# Patient Record
Sex: Female | Born: 1955
Health system: Southern US, Community
[De-identification: ages and names within clinical notes are randomized; demographics above are authoritative.]

## PROBLEM LIST (undated history)

## (undated) DIAGNOSIS — E079 Disorder of thyroid, unspecified: Secondary | ICD-10-CM

## (undated) DIAGNOSIS — K219 Gastro-esophageal reflux disease without esophagitis: Secondary | ICD-10-CM

## (undated) DIAGNOSIS — I8390 Asymptomatic varicose veins of unspecified lower extremity: Secondary | ICD-10-CM

## (undated) DIAGNOSIS — C50919 Malignant neoplasm of unspecified site of unspecified female breast: Secondary | ICD-10-CM

## (undated) DIAGNOSIS — R609 Edema, unspecified: Secondary | ICD-10-CM

## (undated) DIAGNOSIS — E039 Hypothyroidism, unspecified: Secondary | ICD-10-CM

## (undated) DIAGNOSIS — F419 Anxiety disorder, unspecified: Secondary | ICD-10-CM

## (undated) DIAGNOSIS — C801 Malignant (primary) neoplasm, unspecified: Secondary | ICD-10-CM

## (undated) DIAGNOSIS — Z9221 Personal history of antineoplastic chemotherapy: Secondary | ICD-10-CM

## (undated) DIAGNOSIS — F32A Depression, unspecified: Secondary | ICD-10-CM

## (undated) DIAGNOSIS — Z923 Personal history of irradiation: Secondary | ICD-10-CM

## (undated) DIAGNOSIS — F329 Major depressive disorder, single episode, unspecified: Secondary | ICD-10-CM

## (undated) DIAGNOSIS — R5381 Other malaise: Secondary | ICD-10-CM

## (undated) DIAGNOSIS — Z8719 Personal history of other diseases of the digestive system: Secondary | ICD-10-CM

## (undated) HISTORY — DX: Other malaise: R53.81

## (undated) HISTORY — PX: MASTECTOMY: SHX3

## (undated) HISTORY — DX: Disorder of thyroid, unspecified: E07.9

## (undated) HISTORY — PX: ABDOMINAL HYSTERECTOMY: SHX81

## (undated) HISTORY — PX: CHOLECYSTECTOMY: SHX55

## (undated) HISTORY — PX: COLONOSCOPY: SHX174

## (undated) HISTORY — PX: BREAST SURGERY: SHX581

## (undated) HISTORY — PX: ESOPHAGEAL DILATION: SHX303

## (undated) HISTORY — DX: Edema, unspecified: R60.9

## (undated) HISTORY — DX: Asymptomatic varicose veins of unspecified lower extremity: I83.90

---

## 1998-03-04 ENCOUNTER — Other Ambulatory Visit: Admission: RE | Admit: 1998-03-04 | Discharge: 1998-03-04 | Payer: Self-pay | Admitting: Obstetrics and Gynecology

## 2000-07-04 ENCOUNTER — Encounter: Payer: Self-pay | Admitting: Endocrinology

## 2000-07-04 ENCOUNTER — Ambulatory Visit (HOSPITAL_COMMUNITY): Admission: RE | Admit: 2000-07-04 | Discharge: 2000-07-04 | Payer: Self-pay | Admitting: Endocrinology

## 2000-11-28 ENCOUNTER — Encounter: Payer: Self-pay | Admitting: Endocrinology

## 2000-11-28 ENCOUNTER — Ambulatory Visit (HOSPITAL_COMMUNITY): Admission: RE | Admit: 2000-11-28 | Discharge: 2000-11-28 | Payer: Self-pay | Admitting: Endocrinology

## 2001-05-16 ENCOUNTER — Other Ambulatory Visit: Admission: RE | Admit: 2001-05-16 | Discharge: 2001-05-16 | Payer: Self-pay | Admitting: Obstetrics and Gynecology

## 2004-02-17 ENCOUNTER — Other Ambulatory Visit: Admission: RE | Admit: 2004-02-17 | Discharge: 2004-02-17 | Payer: Self-pay | Admitting: Obstetrics and Gynecology

## 2006-02-28 ENCOUNTER — Ambulatory Visit (HOSPITAL_COMMUNITY): Admission: RE | Admit: 2006-02-28 | Discharge: 2006-02-28 | Payer: Self-pay | Admitting: Endocrinology

## 2006-11-30 ENCOUNTER — Encounter: Admission: RE | Admit: 2006-11-30 | Discharge: 2006-11-30 | Payer: Self-pay | Admitting: Gastroenterology

## 2012-11-20 ENCOUNTER — Other Ambulatory Visit (INDEPENDENT_AMBULATORY_CARE_PROVIDER_SITE_OTHER): Payer: BC Managed Care – PPO

## 2012-11-20 DIAGNOSIS — E039 Hypothyroidism, unspecified: Secondary | ICD-10-CM

## 2012-11-20 NOTE — Progress Notes (Unsigned)
Patient here today for labs only. °

## 2012-11-21 LAB — THYROID PANEL WITH TSH
Free Thyroxine Index: 3.7 (ref 1.0–3.9)
T3 Uptake: 33 % (ref 22.5–37.0)
T4, Total: 11.3 ug/dL (ref 5.0–12.5)
TSH: 5.247 u[IU]/mL — ABNORMAL HIGH (ref 0.350–4.500)

## 2012-11-25 ENCOUNTER — Telehealth: Payer: Self-pay | Admitting: Nurse Practitioner

## 2012-11-25 MED ORDER — LEVOTHYROXINE SODIUM 112 MCG PO TABS: 112.0000 ug | ORAL_TABLET | Freq: Every day | ORAL | Status: DC

## 2012-11-25 NOTE — Telephone Encounter (Signed)
Patient was taking levothyroxine 137 mcg last November.  The dose was later changed to 100 and on 11-02-12 it was increased to 112.  Information obtained from [facility]. Please send new rx to them and call Patient at  # [phone].

## 2012-11-25 NOTE — Telephone Encounter (Signed)
Let pt know rx sent in. 

## 2012-11-25 NOTE — Telephone Encounter (Signed)
Message copied by Almeta Monas on Mon Nov 25, 2012 10:05 AM ------      Message from: Bennie Pierini      Created: Thu Nov 21, 2012  9:29 AM       TSH elevated. Is patient on any thyroid meds. Not in computer? ------

## 2013-04-03 ENCOUNTER — Other Ambulatory Visit: Payer: Self-pay | Admitting: Nurse Practitioner

## 2013-04-18 ENCOUNTER — Telehealth: Payer: Self-pay | Admitting: Nurse Practitioner

## 2013-04-18 MED ORDER — FLUCONAZOLE 150 MG PO TABS: 150.0000 mg | ORAL_TABLET | Freq: Once | ORAL | Status: DC

## 2013-04-18 NOTE — Telephone Encounter (Signed)
Diflucan rx sent to pharmacy.

## 2013-04-19 ENCOUNTER — Ambulatory Visit (INDEPENDENT_AMBULATORY_CARE_PROVIDER_SITE_OTHER): Payer: BC Managed Care – PPO | Admitting: Physician Assistant

## 2013-04-19 VITALS — BP 134/78 | HR 78 | Temp 97.4°F | Ht 66.0 in | Wt 194.0 lb

## 2013-04-19 DIAGNOSIS — N760 Acute vaginitis: Secondary | ICD-10-CM

## 2013-04-19 DIAGNOSIS — A499 Bacterial infection, unspecified: Secondary | ICD-10-CM

## 2013-04-19 DIAGNOSIS — B9689 Other specified bacterial agents as the cause of diseases classified elsewhere: Secondary | ICD-10-CM

## 2013-04-19 MED ORDER — FLUCONAZOLE 150 MG PO TABS: 150.0000 mg | ORAL_TABLET | Freq: Once | ORAL | Status: DC

## 2013-04-19 MED ORDER — METRONIDAZOLE 500 MG PO TABS: 500.0000 mg | ORAL_TABLET | Freq: Two times a day (BID) | ORAL | Status: DC

## 2013-04-19 NOTE — Patient Instructions (Addendum)
Cynthia Chavez, Adult A Cynthia Chavez (also called yeast, fungus and [name]) is an overgrowth of yeast that can occur anywhere on the body. A yeast Chavez commonly occurs in warm, moist body areas. Usually, the Chavez remains localized but can spread to become a systemic Chavez. A yeast Chavez may be a sign of a more severe disease such as diabetes, leukemia, or AIDS. A yeast Chavez can occur in both men and women. In women, Cynthia vaginitis is a vaginal Chavez. It is one of the most common causes of vaginitis. Men usually do not have symptoms or know they have an Chavez until other problems develop. Men may find out they have a yeast Chavez because their sex partner has a yeast Chavez. Uncircumcised men are more likely to get a yeast Chavez than circumcised men. This is because the uncircumcised glans is not exposed to air and does not remain as dry as that of a circumcised glans. Older adults may develop yeast infections around dentures. CAUSES  Women  Antibiotics.  Steroid medication taken for a long time.  Being overweight (obese).  Diabetes.  Poor immune condition.  Certain serious medical conditions.  Immune suppressive medications for organ transplant patients.  Chemotherapy.  Pregnancy.  Menstration.  Stress and fatigue.  Intravenous drug use.  Oral contraceptives.  Wearing tight-fitting clothes in the crotch area.  Catching it from a sex partner who has a yeast Chavez.  Spermicide.  Intravenous, urinary, or other catheters. Men  Catching it from a sex partner who has a yeast Chavez.  Having oral or anal sex with a person who has the Chavez.  Spermicide.  Diabetes.  Antibiotics.  Poor immune system.  Medications that suppress the immune system.  Intravenous drug use.  Intravenous, urinary, or other catheters. SYMPTOMS  Women  Thick, white vaginal discharge.  Vaginal itching.  Redness and  swelling in and around the vagina.  Irritation of the lips of the vagina and perineum.  Blisters on the vaginal lips and perineum.  Painful sexual intercourse.  Low blood sugar (hypoglycemia).  Painful urination.  Bladder infections.  Intestinal problems such as constipation, indigestion, bad breath, bloating, increase in gas, diarrhea, or loose stools. Men  Men may develop intestinal problems such as constipation, indigestion, bad breath, bloating, increase in gas, diarrhea, or loose stools.  Dry, cracked skin on the penis with itching or discomfort.  Jock itch.  Dry, flaky skin.  Athlete's foot.  Hypoglycemia. DIAGNOSIS  Women  A history and an exam are performed.  The discharge may be examined under a microscope.  A culture may be taken of the discharge. Men  A history and an exam are performed.  Any discharge from the penis or areas of cracked skin will be looked at under the microscope and cultured.  Stool samples may be cultured. TREATMENT  Women  Vaginal antifungal suppositories and creams.  Medicated creams to decrease irritation and itching on the outside of the vagina.  Warm compresses to the perineal area to decrease swelling and discomfort.  Oral antifungal medications.  Medicated vaginal suppositories or cream for repeated or recurrent infections.  Wash and dry the irritation areas before applying the cream.  Eating yogurt with lactobacillus may help with prevention and treatment.  Sometimes painting the vagina with gentian violet solution may help if creams and suppositories do not work. Men  Antifungal creams and oral antifungal medications.  Sometimes treatment must continue for 30 days after the symptoms go away to prevent recurrence. HOME CARE   INSTRUCTIONS  Women  Use cotton underwear and avoid tight-fitting clothing.  Avoid colored, scented toilet paper and deodorant tampons or pads.  Do not douche.  Keep your diabetes  under control.  Finish all the prescribed medications.  Keep your skin clean and dry.  Consume milk or yogurt with lactobacillus active culture regularly. If you get frequent yeast infections and think that is what the Chavez is, there are over-the-counter medications that you can get. If the Chavez does not show healing in 3 days, talk to your caregiver.  Tell your sex partner you have a yeast Chavez. Your partner may need treatment also, especially if your Chavez does not clear up or recurs. Men  Keep your skin clean and dry.  Keep your diabetes under control.  Finish all prescribed medications.  Tell your sex partner that you have a yeast Chavez so they can be treated if necessary. SEEK MEDICAL CARE IF:   Your symptoms do not clear up or worsen in one week after treatment.  You have an oral temperature above 102 F (38.9 C).  You have trouble swallowing or eating for a prolonged time.  You develop blisters on and around your vagina.  You develop vaginal bleeding and it is not your menstrual period.  You develop abdominal pain.  You develop intestinal problems as mentioned above.  You get weak or lightheaded.  You have painful or increased urination.  You have pain during sexual intercourse. MAKE SURE YOU:   Understand these instructions.  Will watch your condition.  Will get help right away if you are not doing well or get worse. Document Released: 08/31/2004 Document Revised: 10/16/2011 Document Reviewed: 12/13/2009 Exit[employer] Patient Information 2014 [employer].  

## 2013-04-19 NOTE — Progress Notes (Signed)
  Subjective:    Patient ID: Cynthia Chavez, female    DOB: 01-Jun-1956, 57 y.o.   MRN: [id number]  HPI 57 y/o female presents for worsening vaginal itching and rash x 3 days. Has had similar symptoms in the past that were relieved by PO Diflucan. Has tried OTC Monistat 3 days with mild relief.     Review of Systems  Skin: Positive for color change (redness) and rash.       Objective:   Physical Exam  Constitutional: She appears well-developed and well-nourished. No distress.  Skin: Rash (genital areas) noted. She is not diaphoretic. There is erythema.          Assessment & Plan:  Vaginal Candidiasis: Rx Diflucan 150mg  today, repeat in 72 hours.  Bacterial Vaginosis: Due to nature of patient symptoms & frequency of concomitant bacterial infection with candiasis, Prescribed Flagyl 500mg  BID x 7 days. Was unable to perform wet prep to examine due to after hours and no lab available for definite diagnosis RTC if symptoms worsen or do not improve.

## 2013-04-21 NOTE — Telephone Encounter (Signed)
Patient notified that rx sent to pharmacy 

## 2013-04-24 ENCOUNTER — Encounter: Payer: Self-pay | Admitting: Nurse Practitioner

## 2013-04-24 ENCOUNTER — Ambulatory Visit (INDEPENDENT_AMBULATORY_CARE_PROVIDER_SITE_OTHER): Payer: BC Managed Care – PPO | Admitting: Nurse Practitioner

## 2013-04-24 VITALS — BP 118/77 | HR 89 | Temp 99.1°F | Ht 67.0 in | Wt 195.0 lb

## 2013-04-24 DIAGNOSIS — B3749 Other urogenital candidiasis: Secondary | ICD-10-CM

## 2013-04-24 DIAGNOSIS — H9202 Otalgia, left ear: Secondary | ICD-10-CM

## 2013-04-24 DIAGNOSIS — E039 Hypothyroidism, unspecified: Secondary | ICD-10-CM | POA: Insufficient documentation

## 2013-04-24 DIAGNOSIS — H9209 Otalgia, unspecified ear: Secondary | ICD-10-CM

## 2013-04-24 MED ORDER — FLUCONAZOLE 150 MG PO TABS: 150.0000 mg | ORAL_TABLET | Freq: Once | ORAL | Status: DC

## 2013-04-24 NOTE — Progress Notes (Signed)
  Subjective:    Patient ID: Cynthia Chavez, female    DOB: 01/27/1956, 57 y.o.   MRN: [id number]  HPI Patient in c/o left ear pain- Started about 1 week ago- Was seen Saturday for female problems and she was told to use OTC ear wax drops na dthat has not helped- Pain has gotten worse. No drianage- Does C/o some dizziness.  * Diagnosed with yeast infection last Saturday- not completely better * hypothyroidism- Currently on Levothyroxine - needs levels checked -has been on this dose for 2 months.  Review of Systems  Constitutional: Negative for fever.  HENT: Positive for ear pain. Negative for rhinorrhea, sneezing and sinus pressure.   Respiratory: Negative.  Negative for cough.   Cardiovascular: Negative.        Objective:   Physical Exam  Constitutional: She appears well-developed and well-nourished.  HENT:  Head: Normocephalic.  Right Ear: External ear and ear canal normal.  Left Ear: External ear normal.  Nose: Nose normal.  Mouth/Throat: Oropharynx is clear and moist.  Neck: Normal range of motion. Neck supple.  Cardiovascular: Normal rate, regular rhythm and normal heart sounds.   Pulmonary/Chest: Effort normal and breath sounds normal.    BP 118/77  Pulse 89  Temp(Src) 99.1 F (37.3 C) (Oral)  Ht 5\' 7"  (1.702 m)  Wt 195 lb (88.451 kg)  BMI 30.53 kg/m2       Assessment & Plan:  1. Candidiasis of perineum Nystatin externally Meds ordered this encounter  Medications  . fluconazole (DIFLUCAN) 150 MG tablet    Sig: Take 1 tablet (150 mg total) by mouth once. Take 1 tablet on day 1. Repeat in 72 hours.    Dispense:  2 tablet    Refill:  0    Order Specific Question:  Supervising Provider    Answer:  Ernestina Penna [[id number]]     2. Otalgia, left Do not put anymore drops in ear or flush it out Avoid getting water in ear OTC decongestant will help  3. Hypothyroidism Orders Placed This Encounter  Procedures  . Thyroid Panel With TSH     Cynthia  Daphine Deutscher, FNP

## 2013-04-24 NOTE — Patient Instructions (Signed)
Yeast Infection of the Skin Some yeast on the skin is normal, but sometimes it causes an infection. If you have a yeast infection, it shows up as white or light brown patches on brown skin. You can see it better in the summer on tan skin. It causes light-colored holes in your suntan. It can happen on any area of the body. This cannot be passed from person to person. HOME CARE  Scrub your skin daily with a dandruff shampoo. Your rash may take a couple weeks to get well.  Do not scratch or itch the rash. GET HELP RIGHT AWAY IF:   You get another infection from scratching. The skin may get warm, red, and may ooze fluid.  The infection does not seem to be getting better. MAKE SURE YOU:  Understand these instructions.  Will watch your condition.  Will get help right away if you are not doing well or get worse. Document Released: 07/06/2008 Document Revised: 10/16/2011 Document Reviewed: 07/06/2008 Exit[employer] Patient Information 2014 [employer].  

## 2013-04-25 ENCOUNTER — Other Ambulatory Visit: Payer: Self-pay | Admitting: Nurse Practitioner

## 2013-04-25 LAB — THYROID PANEL WITH TSH: TSH: 7.58 u[IU]/mL — ABNORMAL HIGH (ref 0.450–4.500)

## 2013-04-25 MED ORDER — LEVOTHYROXINE SODIUM 125 MCG PO TABS: 125.0000 ug | ORAL_TABLET | Freq: Every day | ORAL | Status: DC

## 2013-06-30 ENCOUNTER — Emergency Department (HOSPITAL_COMMUNITY): Payer: BC Managed Care – PPO

## 2013-06-30 ENCOUNTER — Encounter (HOSPITAL_COMMUNITY): Payer: Self-pay | Admitting: Radiology

## 2013-06-30 ENCOUNTER — Emergency Department (HOSPITAL_COMMUNITY)
Admission: EM | Admit: 2013-06-30 | Discharge: 2013-06-30 | Disposition: A | Discharge status: Home / Self Care | Payer: BC Managed Care – PPO | Attending: Emergency Medicine | Admitting: Emergency Medicine

## 2013-06-30 DIAGNOSIS — R1110 Vomiting, unspecified: Secondary | ICD-10-CM

## 2013-06-30 DIAGNOSIS — Z79899 Other long term (current) drug therapy: Secondary | ICD-10-CM | POA: Insufficient documentation

## 2013-06-30 DIAGNOSIS — E079 Disorder of thyroid, unspecified: Secondary | ICD-10-CM | POA: Insufficient documentation

## 2013-06-30 DIAGNOSIS — R1013 Epigastric pain: Secondary | ICD-10-CM | POA: Insufficient documentation

## 2013-06-30 DIAGNOSIS — F17200 Nicotine dependence, unspecified, uncomplicated: Secondary | ICD-10-CM | POA: Insufficient documentation

## 2013-06-30 LAB — COMPREHENSIVE METABOLIC PANEL
AST: 20 U/L (ref 0–37)
Albumin: 4.1 g/dL (ref 3.5–5.2)
Alkaline Phosphatase: 84 U/L (ref 39–117)
BUN: 13 mg/dL (ref 6–23)
CO2: 22 mEq/L (ref 19–32)
Calcium: 9.3 mg/dL (ref 8.4–10.5)
Chloride: 104 mEq/L (ref 96–112)
GFR calc Af Amer: >90 mL/min (ref >90)
GFR calc non Af Amer: >90 mL/min (ref >90)
Glucose, Bld: 104 mg/dL — ABNORMAL HIGH (ref 70–99)
Potassium: 3.9 mEq/L (ref 3.5–5.1)
Total Bilirubin: 0.7 mg/dL (ref 0.3–1.2)

## 2013-06-30 LAB — CBC
HCT: 45.3 % (ref 36.0–46.0)
Hemoglobin: 15.3 g/dL — ABNORMAL HIGH (ref 12.0–15.0)
MCH: 30.7 pg (ref 26.0–34.0)
MCV: 91 fL (ref 78.0–100.0)
Platelets: 231 10*3/uL (ref 150–400)
RBC: 4.98 MIL/uL (ref 3.87–5.11)
WBC: 7.7 10*3/uL (ref 4.0–10.5)

## 2013-06-30 LAB — LIPASE, BLOOD: Lipase: 23 U/L (ref 11–59)

## 2013-06-30 LAB — POCT I-STAT TROPONIN I: Troponin i, poc: 0 ng/mL (ref 0.00–0.08)

## 2013-06-30 MED ORDER — OMEPRAZOLE 20 MG PO CPDR: 20.0000 mg | DELAYED_RELEASE_CAPSULE | Freq: Every day | ORAL | Status: DC

## 2013-06-30 MED ORDER — ONDANSETRON HCL 4 MG/2ML IJ SOLN
4.0000 mg | Freq: Once | INTRAMUSCULAR | Status: AC
  Administered 2013-06-30: 4 mg via INTRAVENOUS
  Filled 2013-06-30: qty 2

## 2013-06-30 MED ORDER — ONDANSETRON 4 MG PO TBDP: ORAL_TABLET | ORAL | Status: DC

## 2013-06-30 NOTE — ED Notes (Signed)
CT made aware pt finished with contrast. Pt drank 1st cup. Will not go for CT until after 1240.

## 2013-06-30 NOTE — ED Provider Notes (Signed)
CSN: [id number]     Arrival date & time 06/30/13  0920 History   First MD Initiated Contact with Patient 06/30/13 [phone]     Chief Complaint  Patient presents with  . Emesis   (Consider location/radiation/quality/duration/timing/severity/associated sxs/prior Treatment) Patient is a 57 y.o. female presenting with vomiting. The history is provided by the patient.  Emesis Severity:  Moderate Timing:  Intermittent Number of daily episodes:  3 Quality:  Bilious material and stomach contents Progression:  Worsening Chronicity:  New Recent urination:  Normal Relieved by:  Nothing Worsened by:  Nothing tried Ineffective treatments:  None tried Associated symptoms: abdominal pain (epigastric)   Associated symptoms: no diarrhea and no fever     Past Medical History  Diagnosis Date  . Thyroid disease    Past Surgical History  Procedure Laterality Date  . Cholecystectomy    . Abdominal hysterectomy     Family History  Problem Relation Age of Onset  . COPD Father   . Cancer Father    History  Substance Use Topics  . Smoking status: Current Every Day Smoker    Types: Cigarettes    Start date: 04/19/1993  . Smokeless tobacco: Not on file  . Alcohol Use: No   OB History   Grav Para Term Preterm Abortions TAB SAB Ect Mult Living                 Review of Systems  Constitutional: Negative for fever.  Respiratory: Negative for cough and shortness of breath.   Cardiovascular: Negative for chest pain.  Gastrointestinal: Positive for vomiting and abdominal pain (epigastric). Negative for diarrhea.  All other systems reviewed and are negative.    Allergies  Asa; Codeine; and Ivp dye  Home Medications   Current Outpatient Rx  Name  Route  Sig  Dispense  Refill  . levothyroxine (SYNTHROID, LEVOTHROID) 125 MCG tablet   Oral   Take 1 tablet (125 mcg total) by mouth daily.   90 tablet   1   . omeprazole (PRILOSEC) 20 MG capsule   Oral   Take 1 capsule (20 mg total) by  mouth daily.   30 capsule   0   . ondansetron (ZOFRAN ODT) 4 MG disintegrating tablet      4mg  ODT q4 hours prn nausea/vomit   10 tablet   0    BP 104/89  Pulse 91  Temp(Src) 97.9 F (36.6 C)  Resp 20  SpO2 96% Physical Exam  Nursing note and vitals reviewed. Constitutional: She is oriented to person, place, and time. She appears well-developed and well-nourished. No distress.  HENT:  Head: Normocephalic and atraumatic.  Eyes: EOM are normal. Pupils are equal, round, and reactive to light.  Neck: Normal range of motion. Neck supple.  Cardiovascular: Normal rate and regular rhythm.  Exam reveals no friction rub.   No murmur heard. Pulmonary/Chest: Effort normal and breath sounds normal. No respiratory distress. She has no wheezes. She has no rales.  Abdominal: Soft. She exhibits no distension. There is tenderness (epigastric). There is no rebound.  Musculoskeletal: Normal range of motion. She exhibits no edema.  Neurological: She is alert and oriented to person, place, and time.  Skin: She is not diaphoretic.    ED Course  Procedures (including critical care time) Labs Review Labs Reviewed  CBC - Abnormal; Notable for the following:    Hemoglobin 15.3 (*)    All other components within normal limits  COMPREHENSIVE METABOLIC PANEL - Abnormal; Notable for the  following:    Glucose, Bld 104 (*)    All other components within normal limits  LIPASE, BLOOD  LACTIC ACID, PLASMA  POCT I-STAT TROPONIN I   Imaging Review Ct Abdomen Pelvis Wo Contrast  06/30/2013   CLINICAL DATA:  Vomiting.  EXAM: CT ABDOMEN AND PELVIS WITHOUT CONTRAST  TECHNIQUE: Multidetector CT imaging of the abdomen and pelvis was performed following the standard protocol without intravenous contrast. The patient did receive oral contrast material.  COMPARISON:  None.  FINDINGS: There is a moderate-sized hiatal hernia containing contrast. The stomach is normal in contour and contains contrast. There is  contrast within numerous loops of small bowel. There is no evidence of a small or large bowel obstruction. Contrast has reached the right colon. A normal calibered uninflamed appearing appendix is demonstrated on images 53 through 58 of series 2. There is no evidence of acute diverticulitis or other forms of colitis nor of enteritis.  The gallbladder is surgically absent. The liver, spleen, pancreas, adrenal glands, and kidneys appear normal for the noncontrast study. There is a submillimeter calcification in a lower pole calyx of the right kidney. The caliber of the abdominal aorta is normal. There is no periaortic or pericaval lymphadenopathy. The partially distended urinary bladder is normal in appearance. The uterus is surgically absent. There is no adnexal mass. There is no inguinal nor significant umbilical hernia. The lung bases are clear. The lumbar vertebral bodies are preserved in height.  IMPRESSION: 1. There is no evidence of a small or large bowel obstruction. There are no findings to suggest enteritis or colitis or diverticulitis. The orally administered contrast is reached only the hepatic flexure of the colon. 2. The gallbladder is surgically absent. There is no acute hepatobiliary abnormality. 3. There is a nonobstructing sub mm stone in a lower pole calixof the right kidney. The bladder appears normal. 4. The uterus is surgically absent. There is no adnexal mass nor free fluid. 5. There is no intra-abdominal or pelvic lymphadenopathy nor inflammatory masses.   Electronically Signed   By: David  [place]   On: 06/30/2013 13:19    EKG Interpretation    Date/Time:  Monday June 30 2013 09:28:45 EST Ventricular Rate:  108 PR Interval:  146 QRS Duration: 90 QT Interval:  336 QTC Calculation: 450 R Axis:   99 Text Interpretation:  Sinus tachycardia Rightward axis Cannot rule out Anterior infarct , age undetermined Abnormal ECG Confirmed by Gwendolyn Grant  MD, Alias Villagran ([id number]) on 06/30/2013 11:36:11  AM            MDM   1. Vomiting    9F presents with vomiting. Vomiting began last night. She's had some nausea since she ate some chicken she may last night. She is having fluids, but after a few minutes of being in her stomach. Today she had one episode of bilious emesis. She denies any epigastric pain radiating to her back. She is able to tolerate her secretions well. Patient's clinical history and presentation do not match impacted food bolus. Patient with some epigastric pain on exam. Vitals show mild tachycardia otherwise normal. She has no other abdominal pain. She does have a history of cholecystectomy. We'll check labs and CT without contrast. She cannot take IV contrast due to allergy. CT without acute etiology for her pain. Feeling better after Zofran, asking to go home. Given Rx for PPI. Instructed to f/u with PCP.  Dagmar Hait, MD 07/01/13 [phone]

## 2013-06-30 NOTE — ED Notes (Signed)
Vomiting since  Yesterday after eating chicken  And nausea still vomiting

## 2013-06-30 NOTE — ED Notes (Signed)
Water and crackers given to patient

## 2013-07-20 ENCOUNTER — Emergency Department (HOSPITAL_COMMUNITY)
Admission: EM | Admit: 2013-07-20 | Discharge: 2013-07-20 | Disposition: A | Discharge status: Home / Self Care | Payer: BC Managed Care – PPO | Attending: Emergency Medicine | Admitting: Emergency Medicine

## 2013-07-20 ENCOUNTER — Encounter (HOSPITAL_COMMUNITY): Payer: Self-pay | Admitting: Emergency Medicine

## 2013-07-20 DIAGNOSIS — Z888 Allergy status to other drugs, medicaments and biological substances status: Secondary | ICD-10-CM | POA: Insufficient documentation

## 2013-07-20 DIAGNOSIS — F17200 Nicotine dependence, unspecified, uncomplicated: Secondary | ICD-10-CM | POA: Insufficient documentation

## 2013-07-20 DIAGNOSIS — E079 Disorder of thyroid, unspecified: Secondary | ICD-10-CM | POA: Insufficient documentation

## 2013-07-20 DIAGNOSIS — Z79899 Other long term (current) drug therapy: Secondary | ICD-10-CM | POA: Insufficient documentation

## 2013-07-20 DIAGNOSIS — Z885 Allergy status to narcotic agent status: Secondary | ICD-10-CM | POA: Insufficient documentation

## 2013-07-20 DIAGNOSIS — Z9889 Other specified postprocedural states: Secondary | ICD-10-CM | POA: Insufficient documentation

## 2013-07-20 DIAGNOSIS — R220 Localized swelling, mass and lump, head: Secondary | ICD-10-CM

## 2013-07-20 LAB — BASIC METABOLIC PANEL
CO2: 25 mEq/L (ref 19–32)
Calcium: 8.8 mg/dL (ref 8.4–10.5)
Chloride: 104 mEq/L (ref 96–112)
Creatinine, Ser: 0.64 mg/dL (ref 0.50–1.10)
Glucose, Bld: 101 mg/dL — ABNORMAL HIGH (ref 70–99)

## 2013-07-20 LAB — CBC WITH DIFFERENTIAL/PLATELET
Basophils Absolute: 0 10*3/uL (ref 0.0–0.1)
Eosinophils Relative: 5 % (ref 0–5)
HCT: 42.5 % (ref 36.0–46.0)
Lymphocytes Relative: 13 % (ref 12–46)
MCH: 30.5 pg (ref 26.0–34.0)
MCV: 91.2 fL (ref 78.0–100.0)
Monocytes Absolute: 0.4 10*3/uL (ref 0.1–1.0)
Platelets: 205 10*3/uL (ref 150–400)
RDW: 13.1 % (ref 11.5–15.5)
WBC: 7.6 10*3/uL (ref 4.0–10.5)

## 2013-07-20 MED ORDER — FAMOTIDINE IN NACL 20-0.9 MG/50ML-% IV SOLN
20.0000 mg | Freq: Once | INTRAVENOUS | Status: AC
  Administered 2013-07-20: 20 mg via INTRAVENOUS
  Filled 2013-07-20: qty 50

## 2013-07-20 MED ORDER — DIPHENHYDRAMINE HCL 25 MG PO TABS: 25.0000 mg | ORAL_TABLET | Freq: Three times a day (TID) | ORAL | Status: DC

## 2013-07-20 MED ORDER — PREDNISONE 20 MG PO TABS: 40.0000 mg | ORAL_TABLET | Freq: Every day | ORAL | Status: AC

## 2013-07-20 MED ORDER — METHYLPREDNISOLONE SODIUM SUCC 125 MG IJ SOLR
125.0000 mg | INTRAMUSCULAR | Status: AC
  Administered 2013-07-20: 125 mg via INTRAVENOUS
  Filled 2013-07-20: qty 2

## 2013-07-20 MED ORDER — LIDOCAINE VISCOUS 2 % MT SOLN
15.0000 mL | Freq: Once | OROMUCOSAL | Status: AC
  Administered 2013-07-20: 15 mL via OROMUCOSAL
  Filled 2013-07-20: qty 15

## 2013-07-20 MED ORDER — DIPHENHYDRAMINE HCL 50 MG/ML IJ SOLN
25.0000 mg | Freq: Once | INTRAMUSCULAR | Status: AC
  Administered 2013-07-20: 25 mg via INTRAVENOUS
  Filled 2013-07-20: qty 1

## 2013-07-20 MED ORDER — FAMOTIDINE 20 MG PO TABS: 20.0000 mg | ORAL_TABLET | Freq: Two times a day (BID) | ORAL | Status: DC

## 2013-07-20 MED ORDER — SODIUM CHLORIDE 0.9 % IV BOLUS (SEPSIS)
1000.0000 mL | Freq: Once | INTRAVENOUS | Status: AC
  Administered 2013-07-20: 1000 mL via INTRAVENOUS

## 2013-07-20 NOTE — ED Provider Notes (Signed)
CSN: [id number]     Arrival date & time 07/20/13  0709 History   First MD Initiated Contact with Patient 07/20/13 [phone]     Chief Complaint  Patient presents with  . Oral Swelling    HPI  Patient presents with concern of lip and tongue swelling. Patient had endoscopy performed 3 days ago.  She notes that soon thereafter she noticed swelling in her lips, subsequently in her tongue.  Over the interval she has had some relief of the lip swelling with use of Benadryl.  Tongue continues to be enlarged, with mild difficulty swallowing, breathing. No concurrent fevers, chills, syncope, chest pain, dyspnea or other notable complaints. Patient has no document of history of allergies to topical anesthetic.   Past Medical History  Diagnosis Date  . Thyroid disease    Past Surgical History  Procedure Laterality Date  . Cholecystectomy    . Abdominal hysterectomy     Family History  Problem Relation Age of Onset  . COPD Father   . Cancer Father    History  Substance Use Topics  . Smoking status: Current Every Day Smoker    Types: Cigarettes    Start date: 04/19/1993  . Smokeless tobacco: Not on file  . Alcohol Use: No   OB History   Grav Para Term Preterm Abortions TAB SAB Ect Mult Living                 Review of Systems  Constitutional: Negative for fever and chills.  HENT:       Per HPI, otherwise negative  Respiratory:       Per HPI, otherwise negative  Cardiovascular:       Per HPI, otherwise negative  Gastrointestinal: Negative for nausea and vomiting.  Endocrine:       Negative aside from HPI  Genitourinary:       Neg aside from HPI   Musculoskeletal:       Per HPI, otherwise negative  Skin: Negative.   Neurological: Negative for syncope and weakness.    Allergies  Asa; Codeine; and Ivp dye  Home Medications   Current Outpatient Rx  Name  Route  Sig  Dispense  Refill  . levothyroxine (SYNTHROID, LEVOTHROID) 125 MCG tablet   Oral   Take 1 tablet (125 mcg  total) by mouth daily.   90 tablet   1   . omeprazole (PRILOSEC) 20 MG capsule   Oral   Take 1 capsule (20 mg total) by mouth daily.   30 capsule   0   . ondansetron (ZOFRAN ODT) 4 MG disintegrating tablet      4mg  ODT q4 hours prn nausea/vomit   10 tablet   0    BP 144/92  Pulse 65  Temp(Src) 98.3 F (36.8 C) (Oral)  Resp 18  Ht 5\' 7"  (1.702 m)  Wt 195 lb (88.451 kg)  BMI 30.53 kg/m2  SpO2 94% Physical Exam  Nursing note and vitals reviewed. Constitutional: She is oriented to person, place, and time. She appears well-developed and well-nourished. No distress.  HENT:  Head: Normocephalic and atraumatic.  Nose: Nose normal.    Mouth/Throat:    Eyes: Conjunctivae and EOM are normal.  Neck: Neck supple. No muscular tenderness present. No rigidity. No tracheal deviation and no edema present. No mass and no thyromegaly present.  Cardiovascular: Normal rate and regular rhythm.   Pulmonary/Chest: Effort normal and breath sounds normal. No stridor. No respiratory distress.  Abdominal: She exhibits no distension.  Musculoskeletal: She exhibits no edema.  Neurological: She is alert and oriented to person, place, and time. No cranial nerve deficit.  Skin: Skin is warm and dry.  Psychiatric: She has a normal mood and affect.    ED Course  Procedures (including critical care time) Labs Review Labs Reviewed  CBC WITH DIFFERENTIAL  BASIC METABOLIC PANEL   Imaging Review No results found.  EKG Interpretation   None      9:23 AM Tongue swelling down to allow visualization of uvula - no edema MDM  No diagnosis found. Patient presents with concern of oral pharyngeal swelling. Though no clear precipitant can be identified, her description of EGD, requiring the use of topical anesthetic suggests allergic reaction.  The patient's emergency room course she remained hemodynamic stable, with a patent airway.  Swelling decreased, and no new concerning developments occurred.   She was stable for discharge with continued steroids, antihistamines, ENT followup as needed, primary care followup as well.  Gerhard Munch, MD 07/20/13 [phone]

## 2013-07-20 NOTE — ED Notes (Signed)
Swelling to bottom lip has decreased and the tongue is less swollen.  No co,plaints voiced.  No difficulty breathing.

## 2013-07-20 NOTE — ED Notes (Signed)
Patient had an endoscopy on Thursday.  Noticed Friday she had a sore throat (which is normal for her) and her tongue and lips started swelling.  Also stated that she feels like she ha some oral swelling

## 2013-07-22 ENCOUNTER — Ambulatory Visit (INDEPENDENT_AMBULATORY_CARE_PROVIDER_SITE_OTHER): Payer: BC Managed Care – PPO | Admitting: Family Medicine

## 2013-07-22 ENCOUNTER — Encounter: Payer: Self-pay | Admitting: Family Medicine

## 2013-07-22 VITALS — BP 138/84 | HR 77 | Temp 98.1°F | Ht 67.0 in | Wt 191.0 lb

## 2013-07-22 DIAGNOSIS — K219 Gastro-esophageal reflux disease without esophagitis: Secondary | ICD-10-CM

## 2013-07-22 DIAGNOSIS — T783XXD Angioneurotic edema, subsequent encounter: Secondary | ICD-10-CM

## 2013-07-22 DIAGNOSIS — Z5189 Encounter for other specified aftercare: Secondary | ICD-10-CM

## 2013-07-22 NOTE — Progress Notes (Signed)
   Subjective:    Patient ID: Cynthia Chavez, female    DOB: [birth year], 57 y.o.   MRN: [id number]  HPI Pt presents today for hospital followup. Patient was noted to have an elective endoscopy done for esophageal dilatation last week ([facility]). Patient states she did go up secondary angioedema to 3 days later with tongue and lip swelling that required an ER visit. Patient was given a short course of glucocorticoids as well as antihistamines. Lip and tongue swelling was thought to be due to fentanyl and Versed from endoscopy. Patient states that lip and tongue swelling is markedly improved however she use seeming to have some questionable esophageal spasms as well severe reflux flare. No vomiting. Is able to swallow. Is still smoking 1/2- 1 PPD.  No fevers or chills.   Review of Systems  All other systems reviewed and are negative.       Objective:   Physical Exam  Constitutional: She appears well-developed and well-nourished.  HENT:  Head: Normocephalic and atraumatic.  Mouth/Throat: Oropharynx is clear and moist.  No oropharyngeal swelling noted.    Eyes: Conjunctivae are normal. Pupils are equal, round, and reactive to light.  Neck: Normal range of motion.  Cardiovascular: Normal rate and regular rhythm.   Pulmonary/Chest: Effort normal and breath sounds normal.  Abdominal: Soft.  Musculoskeletal: Normal range of motion.  Neurological: She is alert.  Skin: Skin is warm.          Assessment & Plan:  Angioedema, subsequent encounter  GERD (gastroesophageal reflux disease)  Angioedema seems to be clinically resolved. Responded well to prednisone. Suspect esophageal spasm and reflux may be related to recent prednisone use as this may have caused mild case of gastritis. Patient has completed course of prednisone. Continue PPI. Double nexium dose.  There is also a risk of there being a post procedure complications from endoscopy last week. Discuss with patient she would  benefit from general gastroenterology follow up this week. Discussed general and GI red flags.  Follow up as needed.

## 2013-10-06 ENCOUNTER — Ambulatory Visit (INDEPENDENT_AMBULATORY_CARE_PROVIDER_SITE_OTHER): Payer: BC Managed Care – PPO | Admitting: Family Medicine

## 2013-10-06 VITALS — BP 155/93 | HR 81 | Temp 97.6°F | Ht 67.0 in | Wt 195.0 lb

## 2013-10-06 DIAGNOSIS — E039 Hypothyroidism, unspecified: Secondary | ICD-10-CM

## 2013-10-06 DIAGNOSIS — R609 Edema, unspecified: Secondary | ICD-10-CM

## 2013-10-06 DIAGNOSIS — I83893 Varicose veins of bilateral lower extremities with other complications: Secondary | ICD-10-CM

## 2013-10-06 DIAGNOSIS — R5381 Other malaise: Secondary | ICD-10-CM

## 2013-10-06 DIAGNOSIS — R5383 Other fatigue: Secondary | ICD-10-CM

## 2013-10-06 LAB — POCT CBC
Granulocyte percent: 82.5 %G — AB (ref 37–80)
HCT, POC: 43.4 % (ref 37.7–47.9)
Hemoglobin: 13.9 g/dL (ref 12.2–16.2)
Lymph, poc: 1.2 (ref 0.6–3.4)
MCH, POC: 29.1 pg (ref 27–31.2)
MCHC: 32 g/dL (ref 31.8–35.4)
MCV: 90.8 fL (ref 80–97)
MPV: 8.4 fL (ref 0–99.8)
POC Granulocyte: 7.4 — AB (ref 2–6.9)
POC LYMPH PERCENT: 13.1 %L (ref 10–50)
Platelet Count, POC: 207 10*3/uL (ref 142–424)
RBC: 4.8 M/uL (ref 4.04–5.48)
RDW, POC: 13.3 %
WBC: 9 10*3/uL (ref 4.6–10.2)

## 2013-10-06 NOTE — Patient Instructions (Signed)
Varicose Veins  Varicose veins are veins that have become enlarged and twisted.  CAUSES  This condition is the result of valves in the veins not working properly. Valves in the veins help return blood from the leg to the heart. If these valves are damaged, blood flows backwards and backs up into the veins in the leg near the skin. This causes the veins to become larger. People who are on their feet a lot, who are pregnant, or who are overweight are more likely to develop varicose veins.  SYMPTOMS   · Bulging, twisted-appearing, bluish veins, most commonly found on the legs.  · Leg pain or a feeling of heaviness. These symptoms may be worse at the end of the day.  · Leg swelling.  · Skin color changes.  DIAGNOSIS   Varicose veins can usually be diagnosed with an exam of your legs by your caregiver. He or she may recommend an ultrasound of your leg veins.  TREATMENT   Most varicose veins can be treated at home. However, other treatments are available for people who have persistent symptoms or who want to treat the cosmetic appearance of the varicose veins. These include:  · Laser treatment of very small varicose veins.  · Medicine that is shot (injected) into the vein. This medicine hardens the walls of the vein and closes off the vein. This treatment is called sclerotherapy. Afterwards, you may need to wear clothing or bandages that apply pressure.  · Surgery.  HOME CARE INSTRUCTIONS   · Do not stand or sit in one position for long periods of time. Do not sit with your legs crossed. Rest with your legs raised during the day.  · Wear elastic stockings or support hose. Do not wear other tight, encircling garments around the legs, pelvis, or waist.  · Walk as much as possible to increase blood flow.  · Raise the foot of your bed at night with 2-inch blocks.  · If you get a cut in the skin over the vein and the vein bleeds, lie down with your leg raised and press on it with a clean cloth until the bleeding stops. Then  place a bandage (dressing) on the cut. See your caregiver if it continues to bleed or needs stitches.  SEEK MEDICAL CARE IF:   · The skin around your ankle starts to break down.  · You have pain, redness, tenderness, or hard swelling developing in your leg over a vein.  · You are uncomfortable due to leg pain.  Document Released: 05/03/2005 Document Revised: 10/16/2011 Document Reviewed: 09/19/2010  ExitCare® Patient Information ©2014 [employer].

## 2013-10-06 NOTE — Progress Notes (Signed)
   Subjective:    Patient ID: Cynthia Chavez, female    DOB: 04/24/1956, 58 y.o.   MRN: [id number]  HPI This 58 y.o. female presents for evaluation of lower extremity edema and discomfort in her lower extremities.  She states she has hx of anxiety and she is worried about her legs and has been Having some sharp pain in her legs.  She has been on her feet over the weekend painting.  She has Been having some burning like she had when her veins bothered her.  She has hx of varicosities And procedures for veins in the past.  She wants referral for her veins.  She is c/o malaise.  She has Hx of hypothyroidism.  She has been having some arthralgias in her hands.  Review of Systems No chest pain, SOB, HA, dizziness, vision change, N/V, diarrhea, constipation, dysuria, urinary urgency or frequency, myalgias, arthralgias or rash.     Objective:   Physical Exam  Vital signs noted  Well developed well nourished female.  HEENT - Head atraumatic Normocephalic                Eyes - PERRLA, Conjuctiva - clear Sclera- Clear EOMI                Ears - EAC's Wnl TM's Wnl Gross Hearing WNL                Nose - Nares patent                 Throat - oropharanx wnl Respiratory - Lungs CTA bilateral Cardiac - RRR S1 and S2 without murmur GI - Abdomen soft Nontender and bowel sounds active x 4 Extremities  - General edema in her legs and mild pitting bilateral.  She has some varicose veins bilateral LE's.   Neuro - Grossly intact. MS - Synovial thickening DIPS and PIPS bilateral hands     Assessment & Plan:  Edema - Plan: Ambulatory referral to Vascular Surgery.  Reassured this does not appear to look like DVT.  Varicose veins of lower extremities with other complications - Plan: Ambulatory referral to Vascular Surgery  Unspecified hypothyroidism - Plan: POCT CBC, CMP14+EGFR, Lipid panel, Vit D  25 hydroxy (rtn osteoporosis monitoring), Vitamin B12, Thyroid Panel With TSH  Malaise - Plan: POCT CBC,  CMP14+EGFR, Lipid panel, Vit D  25 hydroxy (rtn osteoporosis monitoring), Vitamin B12, Thyroid Panel With TSH  OA - Discussed using aleve or ibuprofen otc as directed.  [place]

## 2013-10-07 ENCOUNTER — Other Ambulatory Visit: Payer: Self-pay | Admitting: Family Medicine

## 2013-10-07 DIAGNOSIS — E785 Hyperlipidemia, unspecified: Secondary | ICD-10-CM

## 2013-10-07 DIAGNOSIS — E538 Deficiency of other specified B group vitamins: Secondary | ICD-10-CM

## 2013-10-07 DIAGNOSIS — E559 Vitamin D deficiency, unspecified: Secondary | ICD-10-CM

## 2013-10-07 LAB — CMP14+EGFR
ALT: 18 IU/L (ref 0–32)
AST: 17 IU/L (ref 0–40)
Albumin/Globulin Ratio: 1.7 (ref 1.1–2.5)
Albumin: 4.3 g/dL (ref 3.6–4.8)
Alkaline Phosphatase: 93 IU/L (ref 39–117)
BUN/Creatinine Ratio: 14 (ref 11–26)
BUN: 9 mg/dL (ref 8–27)
CO2: 22 mmol/L (ref 18–29)
Calcium: 9.5 mg/dL (ref 8.7–10.3)
Chloride: 101 mmol/L (ref 97–108)
Creatinine, Ser: 0.66 mg/dL (ref 0.57–1.00)
GFR calc Af Amer: 111 mL/min/{1.73_m2} (ref >59)
GFR calc non Af Amer: 96 mL/min/{1.73_m2} (ref >59)
Globulin, Total: 2.6 g/dL (ref 1.5–4.5)
Glucose: 103 mg/dL — ABNORMAL HIGH (ref 65–99)
Potassium: 4.3 mmol/L (ref 3.5–5.2)
Sodium: 140 mmol/L (ref 134–144)
Total Bilirubin: 0.8 mg/dL (ref 0.0–1.2)
Total Protein: 6.9 g/dL (ref 6.0–8.5)

## 2013-10-07 LAB — THYROID PANEL WITH TSH
Free Thyroxine Index: 3.1 (ref 1.2–4.9)
T3 Uptake Ratio: 28 % (ref 24–39)
T4, Total: 11.1 ug/dL (ref 4.5–12.0)
TSH: 3.01 u[IU]/mL (ref 0.450–4.500)

## 2013-10-07 LAB — LIPID PANEL
Chol/HDL Ratio: 5.1 ratio units — ABNORMAL HIGH (ref 0.0–4.4)
Cholesterol, Total: 215 mg/dL — ABNORMAL HIGH (ref 100–199)
HDL: 42 mg/dL (ref >39)
LDL Calculated: 152 mg/dL — ABNORMAL HIGH (ref 0–99)
Triglycerides: 104 mg/dL (ref 0–149)
VLDL Cholesterol Cal: 21 mg/dL (ref 5–40)

## 2013-10-07 LAB — VITAMIN B12: Vitamin B-12: 291 pg/mL (ref 211–946)

## 2013-10-07 LAB — VITAMIN D 25 HYDROXY (VIT D DEFICIENCY, FRACTURES): Vit D, 25-Hydroxy: 12.3 ng/mL — ABNORMAL LOW (ref 30.0–100.0)

## 2013-10-07 MED ORDER — CYANOCOBALAMIN 1000 MCG/ML IJ SOLN: INTRAMUSCULAR | Status: DC

## 2013-10-07 MED ORDER — PRAVASTATIN SODIUM 40 MG PO TABS: 40.0000 mg | ORAL_TABLET | Freq: Every day | ORAL | Status: DC

## 2013-10-07 MED ORDER — VITAMIN D (ERGOCALCIFEROL) 1.25 MG (50000 UNIT) PO CAPS: 50000.0000 [IU] | ORAL_CAPSULE | ORAL | Status: DC

## 2013-10-10 ENCOUNTER — Encounter: Payer: Self-pay | Admitting: Vascular Surgery

## 2013-10-10 ENCOUNTER — Other Ambulatory Visit: Payer: Self-pay | Admitting: *Deleted

## 2013-10-10 DIAGNOSIS — I83893 Varicose veins of bilateral lower extremities with other complications: Secondary | ICD-10-CM

## 2013-10-17 ENCOUNTER — Encounter: Payer: Self-pay | Admitting: Vascular Surgery

## 2013-10-20 ENCOUNTER — Ambulatory Visit (HOSPITAL_COMMUNITY)
Admission: RE | Admit: 2013-10-20 | Discharge: 2013-10-20 | Disposition: A | Discharge status: Home / Self Care | Payer: BC Managed Care – PPO | Source: Ambulatory Visit | Attending: Vascular Surgery | Admitting: Vascular Surgery

## 2013-10-20 ENCOUNTER — Ambulatory Visit (INDEPENDENT_AMBULATORY_CARE_PROVIDER_SITE_OTHER): Payer: BC Managed Care – PPO | Admitting: Vascular Surgery

## 2013-10-20 ENCOUNTER — Encounter: Payer: Self-pay | Admitting: Vascular Surgery

## 2013-10-20 VITALS — BP 160/98 | HR 74 | Resp 16 | Ht 67.5 in | Wt 190.0 lb

## 2013-10-20 DIAGNOSIS — I83893 Varicose veins of bilateral lower extremities with other complications: Secondary | ICD-10-CM

## 2013-10-20 NOTE — Progress Notes (Signed)
Subjective:     Patient ID: Cynthia Chavez, female   DOB: 05-20-1956, 58 y.o.   MRN: [id number]  HPI this 58 year old female who is evaluated for painful varicosities in both lower extremities. She has had bulging varicosities and severe reticular and spider veins for the past 10-15 years and has had previous treatment with injections in [place] 10 years ago. Bulges have enlarged and become more painful and she has developed increasing edema in both ankles right worse than left. She does now or elastic compression stockings or elevate her legs. Ibuprofen upsets her stomach. She has no history of DVT, stasis ulcers, thrombophlebitis, bleeding, or other complications.  Past Medical History  Diagnosis Date  . Thyroid disease   . Varicose veins   . Edema     bilateral feet and leg swelling  . Malaise     History  Substance Use Topics  . Smoking status: Current Every Day Smoker    Types: Cigarettes    Start date: 04/19/1993  . Smokeless tobacco: Not on file  . Alcohol Use: No    Family History  Problem Relation Age of Onset  . COPD Father   . Cancer Father     Allergies  Allergen Reactions  . Ivp Dye [Iodinated Diagnostic Agents] Other (See Comments)    Chest pain  . Asa [Aspirin] Other (See Comments)    spasms  . Codeine Other (See Comments)    spasms    Current outpatient prescriptions:esomeprazole (NEXIUM) 20 MG capsule, Take 20 mg by mouth daily at 12 noon., Disp: , Rfl: ;  levothyroxine (SYNTHROID, LEVOTHROID) 125 MCG tablet, Take 1 tablet (125 mcg total) by mouth daily., Disp: 90 tablet, Rfl: 1;  cyanocobalamin (,VITAMIN B-12,) 1000 MCG/ML injection, Inject one ml IM daily for a week then weekly for 4 weeks then monthly, Disp: 10 mL, Rfl: 1 pravastatin (PRAVACHOL) 40 MG tablet, Take 1 tablet (40 mg total) by mouth daily., Disp: 90 tablet, Rfl: 3;  Vitamin D, Ergocalciferol, (DRISDOL) 50000 UNITS CAPS capsule, Take 1 capsule (50,000 Units total) by mouth every 7 (seven) days.,  Disp: 18 capsule, Rfl: 0  BP 160/98  Pulse 74  Resp 16  Ht 5' 7.5" (1.715 m)  Wt 190 lb (86.183 kg)  BMI 29.30 kg/m2  Body mass index is 29.3 kg/(m^2).           Review of Systems denies chest pain, dyspnea on exertion, PND, orthopnea, hemoptysis, claudication, lateralizing weakness, aphasia, amaurosis fugax, diplopia, blurred vision, syncopee  -other systems negative and complete review of systems Objective:   Physical Exam BP 160/98  Pulse 74  Resp 16  Ht 5' 7.5" (1.715 m)  Wt 190 lb (86.183 kg)  BMI 29.30 kg/m2  Gen.-alert and oriented x3 in no apparent distress HEENT normal for age Lungs no rhonchi or wheezing Cardiovascular regular rhythm no murmurs carotid pulses 3+ palpable no bruits audible Abdomen soft nontender no palpable masses Musculoskeletal free of  major deformities Skin clear -no rashes Neurologic normal Lower extremities 3+ femoral and dorsalis pedis pulses palpable bilaterally with 1+ edema bilaterally Right leg with extensive reticular and spider veins lower third of leg on the dorsum of foot with bulging varicosities or great saphenous system and distal thigh and calf Left leg with bulging varicosities medial thigh and calf with extensive reticular and spider veins lower third of leg Both legs with 1+ edema  Today who ordered a venous duplex exam of both legs are reviewed and interpreted. There is no DVT.  There is gross reflux in both greater saphenous system supplying his bulging varicosities.     Assessment:      severe gross reflux bilateral grade saphenous veins with bulging varicosities and distal edema and extensive reticular veins causing pain and affecting patient daily living     Plan:         #1 long leg elastic compression stockings 20-30 mm gradient #2 elevate legs as much as possible #3 ibuprofen daily on a regular basis for pain #4 return in 3 months-if no significant improvement then she will need #1 laser ablation left great  saphenous vein with 10-20 stab phlebectomy followed by #2 laser ablation right great saphenous vein with 10-20 stab phlebectomy followed by #32 courses of sclerotherapy for each leg  Patient return in 3 months

## 2013-11-12 ENCOUNTER — Telehealth: Payer: Self-pay | Admitting: Nurse Practitioner

## 2013-11-12 MED ORDER — LEVOTHYROXINE SODIUM 125 MCG PO TABS: 125.0000 ug | ORAL_TABLET | Freq: Every day | ORAL | Status: DC

## 2013-11-12 NOTE — Telephone Encounter (Signed)
done

## 2013-11-13 ENCOUNTER — Encounter: Payer: BC Managed Care – PPO | Admitting: Vascular Surgery

## 2013-11-13 ENCOUNTER — Encounter (HOSPITAL_COMMUNITY): Payer: BC Managed Care – PPO

## 2014-01-19 ENCOUNTER — Encounter: Payer: Self-pay | Admitting: Vascular Surgery

## 2014-01-19 ENCOUNTER — Telehealth: Payer: Self-pay | Admitting: Vascular Surgery

## 2014-01-19 NOTE — Telephone Encounter (Signed)
Pt lm for [place] to cancel her appt on 01/20/14 w/ JDL. Canceled appt. Called pt, pt did not answer. Lm on hm# for pt to [facility]tr[facility].

## 2014-01-20 ENCOUNTER — Ambulatory Visit: Payer: BC Managed Care – PPO | Admitting: Vascular Surgery

## 2014-01-22 ENCOUNTER — Telehealth: Payer: Self-pay | Admitting: Family

## 2014-01-22 NOTE — Telephone Encounter (Signed)
appt scheduled

## 2014-01-23 ENCOUNTER — Ambulatory Visit (INDEPENDENT_AMBULATORY_CARE_PROVIDER_SITE_OTHER): Payer: BC Managed Care – PPO | Admitting: Family

## 2014-01-23 ENCOUNTER — Ambulatory Visit (INDEPENDENT_AMBULATORY_CARE_PROVIDER_SITE_OTHER): Payer: BC Managed Care – PPO

## 2014-01-23 ENCOUNTER — Encounter: Payer: Self-pay | Admitting: Family

## 2014-01-23 VITALS — BP 133/90 | HR 99 | Temp 98.7°F | Ht 67.5 in | Wt 184.0 lb

## 2014-01-23 DIAGNOSIS — Z716 Tobacco abuse counseling: Secondary | ICD-10-CM

## 2014-01-23 DIAGNOSIS — R1011 Right upper quadrant pain: Secondary | ICD-10-CM

## 2014-01-23 DIAGNOSIS — K5900 Constipation, unspecified: Secondary | ICD-10-CM

## 2014-01-23 DIAGNOSIS — H60393 Other infective otitis externa, bilateral: Secondary | ICD-10-CM

## 2014-01-23 DIAGNOSIS — Z7189 Other specified counseling: Secondary | ICD-10-CM

## 2014-01-23 DIAGNOSIS — F17200 Nicotine dependence, unspecified, uncomplicated: Secondary | ICD-10-CM

## 2014-01-23 DIAGNOSIS — H60399 Other infective otitis externa, unspecified ear: Secondary | ICD-10-CM

## 2014-01-23 LAB — POCT CBC
GRANULOCYTE PERCENT: 76.9 % (ref 37–80)
HEMATOCRIT: 44.8 % (ref 37.7–47.9)
HEMOGLOBIN: 14.1 g/dL (ref 12.2–16.2)
Lymph, poc: 1.6 (ref 0.6–3.4)
MCH, POC: 28.9 pg (ref 27–31.2)
MCHC: 31.6 g/dL — AB (ref 31.8–35.4)
MCV: 91.4 fL (ref 80–97)
MPV: 8.2 fL (ref 0–99.8)
POC GRANULOCYTE: 6.5 (ref 2–6.9)
POC LYMPH PERCENT: 18.3 %L (ref 10–50)
Platelet Count, POC: 215 10*3/uL (ref 142–424)
RBC: 4.9 M/uL (ref 4.04–5.48)
RDW, POC: 13.3 %
WBC: 8.5 10*3/uL (ref 4.6–10.2)

## 2014-01-23 MED ORDER — HYDROCORTISONE-ACETIC ACID 1-2 % OT SOLN: 4.0000 [drp] | Freq: Two times a day (BID) | OTIC | Status: DC

## 2014-01-23 MED ORDER — NICOTINE 14 MG/24HR TD PT24: 14.0000 mg | MEDICATED_PATCH | Freq: Every day | TRANSDERMAL | Status: DC

## 2014-01-23 NOTE — Progress Notes (Signed)
   Subjective:    Patient ID: Cynthia Chavez, female    DOB: 12/12/1955, 58 y.o.   MRN: [id number]  Abdominal Pain This is a new problem. The current episode started in the past 7 days (Sunday). The onset quality is gradual. The problem occurs intermittently. The problem has been gradually worsening. The pain is located in the RUQ. The pain is at a severity of 9/10. The pain is moderate. The quality of the pain is burning. The abdominal pain radiates to the epigastric region. Associated symptoms include diarrhea and nausea. Pertinent negatives include no constipation or vomiting. The pain is aggravated by movement. The pain is relieved by palpation. She has tried nothing for the symptoms.   *Pt had gallbladder removed about 15 years ago.    Review of Systems  HENT: Negative.   Eyes: Negative.   Respiratory: Negative.   Gastrointestinal: Positive for nausea, abdominal pain and diarrhea. Negative for vomiting and constipation.  Genitourinary: Negative.   Musculoskeletal: Negative.   Hematological: Negative.   Psychiatric/Behavioral: Negative.   All other systems reviewed and are negative.      Objective:   Physical Exam  Vitals reviewed. Constitutional: She is oriented to person, place, and time. She appears well-developed and well-nourished. No distress.  HENT:  Right Ear: Tympanic membrane is bulging.  Left Ear: Tympanic membrane is bulging.  Cardiovascular: Normal rate, regular rhythm, normal heart sounds and intact distal pulses.   No murmur heard. Pulmonary/Chest: Effort normal and breath sounds normal. No respiratory distress. She has no wheezes.  Abdominal: Soft. Bowel sounds are normal. She exhibits no distension. There is tenderness. There is rebound and guarding.  Musculoskeletal: Normal range of motion. She exhibits no edema and no tenderness.  Neurological: She is alert and oriented to person, place, and time.  Skin: Skin is warm and dry.  Psychiatric: She has a normal mood  and affect. Her behavior is normal. Judgment and thought content normal.    BP 133/90  Pulse 99  Temp(Src) 98.7 F (37.1 C) (Oral)  Ht 5' 7.5" (1.715 m)  Wt 184 lb (83.462 kg)  BMI 28.38 kg/m2  X-ray- Colon full of stool Preliminary reading by Evelina Dun, FNP [facility]      Assessment & Plan:  1. RUQ pain - DG Abd 1 View; Future - POCT CBC - CMP14+EGFR - Lipase - Amylase  2. Encounter for smoking cessation counseling - nicotine (NICODERM CQ) 14 mg/24hr patch; Place 1 patch (14 mg total) onto the skin daily.  Dispense: 28 patch; Refill: 1  3. Otitis, externa, infective, bilateral - acetic acid-hydrocortisone (VOSOL-HC) otic solution; Place 4 drops into both ears 2 (two) times daily.  Dispense: 10 mL; Refill: 0  4. Unspecified constipation -Take MOM BID - Warm prune juice -Take laxatives for next 3-4 days until pt is cleared out  Evelina Dun, FNP

## 2014-01-23 NOTE — Patient Instructions (Signed)
Constipation  Constipation is when a person has fewer than three bowel movements a week, has difficulty having a bowel movement, or has stools that are dry, hard, or larger than normal. As people grow older, constipation is more common. If you try to fix constipation with medicines that make you have a bowel movement (laxatives), the problem may get worse. Long-term laxative use may cause the muscles of the colon to become weak. A low-fiber diet, not taking in enough fluids, and taking certain medicines may make constipation worse.   CAUSES   · Certain medicines, such as antidepressants, pain medicine, iron supplements, antacids, and water pills.    · Certain diseases, such as diabetes, irritable bowel syndrome (IBS), thyroid disease, or depression.    · Not drinking enough water.    · Not eating enough fiber-rich foods.    · Stress or travel.    · Lack of physical activity or exercise.    · Ignoring the urge to have a bowel movement.    · Using laxatives too much.    SIGNS AND SYMPTOMS   · Having fewer than three bowel movements a week.    · Straining to have a bowel movement.    · Having stools that are hard, dry, or larger than normal.    · Feeling full or bloated.    · Pain in the lower abdomen.    · Not feeling relief after having a bowel movement.    DIAGNOSIS   Your health care Cynthia Chavez will take a medical history and perform a physical exam. Further testing may be done for severe constipation. Some tests may include:  · A barium enema X-ray to examine your rectum, colon, and, sometimes, your small intestine.    · A sigmoidoscopy to examine your lower colon.    · A colonoscopy to examine your entire colon.  TREATMENT   Treatment will depend on the severity of your constipation and what is causing it. Some dietary treatments include drinking more fluids and eating more fiber-rich foods. Lifestyle treatments may include regular exercise. If these diet and lifestyle recommendations do not help, your health care  Cynthia Chavez may recommend taking over-the-counter laxative medicines to help you have bowel movements. Prescription medicines may be prescribed if over-the-counter medicines do not work.   HOME CARE INSTRUCTIONS   · Eat foods that have a lot of fiber, such as fruits, vegetables, whole grains, and beans.  · Limit foods high in fat and processed sugars, such as french fries, hamburgers, cookies, candies, and soda.    · A fiber supplement may be added to your diet if you cannot get enough fiber from foods.    · Drink enough fluids to keep your urine clear or pale yellow.    · Exercise regularly or as directed by your health care Cynthia Chavez.    · Go to the restroom when you have the urge to go. Do not hold it.    · Only take over-the-counter or prescription medicines as directed by your health care Cynthia Chavez. Do not take other medicines for constipation without talking to your health care Cynthia Chavez first.    SEEK IMMEDIATE MEDICAL CARE IF:   · You have bright red blood in your stool.    · Your constipation lasts for more than 4 days or gets worse.    · You have abdominal or rectal pain.    · You have thin, pencil-like stools.    · You have unexplained weight loss.  MAKE SURE YOU:   · Understand these instructions.  · Will watch your condition.  · Will get help right away if you are not   doing well or get worse.  Document Released: 04/21/2004 Document Revised: 07/29/2013 Document Reviewed: 05/05/2013  ExitCare® Patient Information ©2015 [employer]. This information is not intended to replace advice given to you by your health care Cynthia Chavez. Make sure you discuss any questions you have with your health care Cynthia Chavez.

## 2014-01-24 LAB — CMP14+EGFR
A/G RATIO: 1.7 (ref 1.1–2.5)
ALBUMIN: 4.4 g/dL (ref 3.6–4.8)
ALT: 97 IU/L — ABNORMAL HIGH (ref 0–32)
AST: 44 IU/L — ABNORMAL HIGH (ref 0–40)
Alkaline Phosphatase: 129 IU/L — ABNORMAL HIGH (ref 39–117)
BILIRUBIN TOTAL: 0.7 mg/dL (ref 0.0–1.2)
BUN / CREAT RATIO: 17 (ref 11–26)
BUN: 12 mg/dL (ref 8–27)
CO2: 25 mmol/L (ref 18–29)
Calcium: 9.4 mg/dL (ref 8.7–10.3)
Chloride: 99 mmol/L (ref 97–108)
Creatinine, Ser: 0.72 mg/dL (ref 0.57–1.00)
GFR, EST AFRICAN AMERICAN: 105 mL/min/{1.73_m2} (ref >59)
GFR, EST NON AFRICAN AMERICAN: 91 mL/min/{1.73_m2} (ref >59)
GLUCOSE: 86 mg/dL (ref 65–99)
Globulin, Total: 2.6 g/dL (ref 1.5–4.5)
Potassium: 4.3 mmol/L (ref 3.5–5.2)
Sodium: 139 mmol/L (ref 134–144)
TOTAL PROTEIN: 7 g/dL (ref 6.0–8.5)

## 2014-01-24 LAB — AMYLASE: Amylase: 45 U/L (ref 31–124)

## 2014-01-24 LAB — LIPASE: LIPASE: 23 U/L (ref 0–59)

## 2014-01-25 ENCOUNTER — Emergency Department (HOSPITAL_COMMUNITY)
Admission: EM | Admit: 2014-01-25 | Discharge: 2014-01-26 | Disposition: A | Discharge status: Home / Self Care | Payer: BC Managed Care – PPO | Attending: Emergency Medicine | Admitting: Emergency Medicine

## 2014-01-25 ENCOUNTER — Emergency Department (HOSPITAL_COMMUNITY): Payer: BC Managed Care – PPO

## 2014-01-25 ENCOUNTER — Encounter (HOSPITAL_COMMUNITY): Payer: Self-pay | Admitting: Emergency Medicine

## 2014-01-25 DIAGNOSIS — R1084 Generalized abdominal pain: Secondary | ICD-10-CM

## 2014-01-25 DIAGNOSIS — E079 Disorder of thyroid, unspecified: Secondary | ICD-10-CM | POA: Insufficient documentation

## 2014-01-25 DIAGNOSIS — Z79899 Other long term (current) drug therapy: Secondary | ICD-10-CM | POA: Insufficient documentation

## 2014-01-25 DIAGNOSIS — F17200 Nicotine dependence, unspecified, uncomplicated: Secondary | ICD-10-CM | POA: Insufficient documentation

## 2014-01-25 DIAGNOSIS — R748 Abnormal levels of other serum enzymes: Secondary | ICD-10-CM

## 2014-01-25 DIAGNOSIS — K5900 Constipation, unspecified: Secondary | ICD-10-CM | POA: Insufficient documentation

## 2014-01-25 DIAGNOSIS — E869 Volume depletion, unspecified: Secondary | ICD-10-CM

## 2014-01-25 DIAGNOSIS — Z8679 Personal history of other diseases of the circulatory system: Secondary | ICD-10-CM | POA: Insufficient documentation

## 2014-01-25 LAB — CBC WITH DIFFERENTIAL/PLATELET
BASOS ABS: 0 10*3/uL (ref 0.0–0.1)
Basophils Relative: 0 % (ref 0–1)
EOS ABS: 0.4 10*3/uL (ref 0.0–0.7)
EOS PCT: 5 % (ref 0–5)
HEMATOCRIT: 39.4 % (ref 36.0–46.0)
Hemoglobin: 12.8 g/dL (ref 12.0–15.0)
LYMPHS PCT: 27 % (ref 12–46)
Lymphs Abs: 2.2 10*3/uL (ref 0.7–4.0)
MCH: 30 pg (ref 26.0–34.0)
MCHC: 32.5 g/dL (ref 30.0–36.0)
MCV: 92.3 fL (ref 78.0–100.0)
MONO ABS: 0.6 10*3/uL (ref 0.1–1.0)
Monocytes Relative: 7 % (ref 3–12)
Neutro Abs: 5 10*3/uL (ref 1.7–7.7)
Neutrophils Relative %: 61 % (ref 43–77)
PLATELETS: 194 10*3/uL (ref 150–400)
RBC: 4.27 MIL/uL (ref 3.87–5.11)
RDW: 13.2 % (ref 11.5–15.5)
WBC: 8.2 10*3/uL (ref 4.0–10.5)

## 2014-01-25 LAB — URINALYSIS, ROUTINE W REFLEX MICROSCOPIC
BILIRUBIN URINE: NEGATIVE
Glucose, UA: NEGATIVE mg/dL
Hgb urine dipstick: NEGATIVE
KETONES UR: NEGATIVE mg/dL
Leukocytes, UA: NEGATIVE
NITRITE: NEGATIVE
Protein, ur: NEGATIVE mg/dL
Specific Gravity, Urine: 1.014 (ref 1.005–1.030)
Urobilinogen, UA: 0.2 mg/dL (ref 0.0–1.0)
pH: 7.5 (ref 5.0–8.0)

## 2014-01-25 LAB — POC OCCULT BLOOD, ED: Fecal Occult Bld: NEGATIVE

## 2014-01-25 MED ORDER — SODIUM CHLORIDE 0.9 % IV BOLUS (SEPSIS)
1000.0000 mL | Freq: Once | INTRAVENOUS | Status: AC
  Administered 2014-01-25: 1000 mL via INTRAVENOUS

## 2014-01-25 MED ORDER — HYDROMORPHONE HCL PF 1 MG/ML IJ SOLN
1.0000 mg | Freq: Once | INTRAMUSCULAR | Status: AC
  Administered 2014-01-25: 1 mg via INTRAVENOUS
  Filled 2014-01-25: qty 1

## 2014-01-25 MED ORDER — ONDANSETRON HCL 4 MG/2ML IJ SOLN
4.0000 mg | Freq: Once | INTRAMUSCULAR | Status: AC
  Administered 2014-01-25: 4 mg via INTRAVENOUS
  Filled 2014-01-25: qty 2

## 2014-01-25 NOTE — ED Notes (Signed)
Pt back from CT

## 2014-01-25 NOTE — Discharge Instructions (Signed)

## 2014-01-25 NOTE — ED Notes (Addendum)
PT states she hasn't had any appetite this week and last BM was Tuesday. She states she has some liquid, but no regular stool. PT reports nausea and RUQ pain (hx of gallbladder rmvl). RUQ tender upon palpation. PT took mag citrate today and has had clear liquid BM's today. PT feels weak and worn out.

## 2014-01-25 NOTE — ED Provider Notes (Signed)
CSN: [id number]     Arrival date & time 01/25/14  1911 History   First MD Initiated Contact with Patient 01/25/14 1951     Chief Complaint  Patient presents with  . Constipation     (Consider location/radiation/quality/duration/timing/severity/associated sxs/prior Treatment) HPI Is a 58 year old female who comes in today complaining of constipation. She states that she had some crampy abdominal pain in the week I want to see her primary care Dr. on Friday. She was told that she had constipation and to take her Lasix. She had 2 doses of MiraLAX yesterday and make citrate today. She states that she has not had any formed stool but has just had watery stools since that time. She continues to have some mild crampy abdominal pain that she thinks is secondary to constipation. She denies any rectal bleeding. She has not had nausea, vomiting, fever, chills, or urinary tract infection symptoms.  Past Medical History  Diagnosis Date  . Thyroid disease   . Varicose veins   . Edema     bilateral feet and leg swelling  . Malaise    Past Surgical History  Procedure Laterality Date  . Cholecystectomy    . Abdominal hysterectomy    . Esophageal dilation     Family History  Problem Relation Age of Onset  . COPD Father   . Cancer Father    History  Substance Use Topics  . Smoking status: Current Every Day Smoker    Types: Cigarettes    Start date: 04/19/1993  . Smokeless tobacco: Not on file  . Alcohol Use: No   OB History   Grav Para Term Preterm Abortions TAB SAB Ect Mult Living                 Review of Systems  All other systems reviewed and are negative.     Allergies  Ivp dye; Other; Dilaudid; Asa; and Codeine  Home Medications   Prior to Admission medications   Medication Sig Start Date End Date Taking? Authorizing Weylin Plagge  acetic acid-hydrocortisone (VOSOL-HC) otic solution Place 4 drops into both ears 2 (two) times daily. 01/23/14  Yes Sharion Balloon, FNP   esomeprazole (NEXIUM) 20 MG capsule Take 20 mg by mouth daily at 12 noon.   Yes Historical Indya Oliveria, MD  levothyroxine (SYNTHROID, LEVOTHROID) 125 MCG tablet Take 1 tablet (125 mcg total) by mouth daily. 11/12/13  Yes Lysbeth Penner, FNP  pravastatin (PRAVACHOL) 40 MG tablet Take 40 mg by mouth daily.    Historical Donovan Persley, MD   BP 139/80  Pulse 65  Temp(Src) 98.1 F (36.7 C)  Resp 17  Ht 5\' 7"  (1.702 m)  SpO2 99% Physical Exam  Nursing note and vitals reviewed. Constitutional: She is oriented to person, place, and time. She appears well-developed and well-nourished.  HENT:  Head: Normocephalic and atraumatic.  Right Ear: External ear normal.  Left Ear: External ear normal.  Nose: Nose normal.  Mouth/Throat: Oropharynx is clear and moist.  Eyes: Conjunctivae and EOM are normal. Pupils are equal, round, and reactive to light.  Neck: Normal range of motion. Neck supple. No JVD present. No tracheal deviation present. No thyromegaly present.  Cardiovascular: Normal rate, regular rhythm, normal heart sounds and intact distal pulses.   Pulmonary/Chest: Effort normal and breath sounds normal. No respiratory distress. She has no wheezes.  Abdominal: Soft. Bowel sounds are normal. She exhibits no mass. There is tenderness. There is no guarding.  Mild diffuse tenderness to palpation  Musculoskeletal: Normal  range of motion.  Lymphadenopathy:    She has no cervical adenopathy.  Neurological: She is alert and oriented to person, place, and time. She has normal reflexes. No cranial nerve deficit or sensory deficit. Gait normal. GCS eye subscore is 4. GCS verbal subscore is 5. GCS motor subscore is 6.  Reflex Scores:      Bicep reflexes are 2+ on the right side and 2+ on the left side.      Patellar reflexes are 2+ on the right side and 2+ on the left side. Strength is 5/5 bilateral elbow flexor/extensors, wrist extension/flexion, intrinsic hand strength equal Bilateral hip flexion/extension  5/5, knee flexion/extension 5/5, ankle 5/5 flexion extension    Skin: Skin is warm and dry.  Psychiatric: She has a normal mood and affect. Her behavior is normal. Judgment and thought content normal.    ED Course  Procedures (including critical care time) Labs Review Labs Reviewed  COMPREHENSIVE METABOLIC PANEL - Abnormal; Notable for the following:    Glucose, Bld 101 (*)    AST 44 (*)    ALT 73 (*)    Alkaline Phosphatase 120 (*)    All other components within normal limits  CBC WITH DIFFERENTIAL  LIPASE, BLOOD  URINALYSIS, ROUTINE W REFLEX MICROSCOPIC  POC OCCULT BLOOD, ED    Imaging Review Ct Abdomen Pelvis Wo Contrast  01/25/2014   CLINICAL DATA:  Constipation for several days. Weakness. Nausea. Headache. Abdominal pain.  EXAM: CT ABDOMEN AND PELVIS WITHOUT CONTRAST  TECHNIQUE: Multidetector CT imaging of the abdomen and pelvis was performed following the standard protocol without IV contrast.  COMPARISON:  06/30/2013  FINDINGS: Visualized lung bases are clear. Moderate-sized esophageal hiatal hernia.  Surgical absence of the gallbladder. The unenhanced appearance of the liver, spleen, pancreas, adrenal glands, kidneys, abdominal aorta, inferior vena cava, and retroperitoneal lymph nodes is unremarkable. Stomach, small bowel, and colon are not abnormally distended. Scattered stool is present in the colon. There is a linear radiopaque structure demonstrated in the cecum which could represent ingested foreign body. There is no associated bowel wall thickening or infiltration. No free air or free fluid in the abdomen.  Pelvis: Surgical absence of the uterus. No pelvic mass or lymphadenopathy. Appendix is normal. No diverticulitis. No free or loculated pelvic fluid collections. Mild degenerative changes in the spine. No destructive bone lesions appreciated.  IMPRESSION: Moderate-sized esophageal hiatal hernia. No evidence of bowel obstruction. Linear radiopaque structure is present in the  cecum which may indicated ingested foreign body.   Electronically Signed   By: Lucienne Capers M.D.   On: 01/25/2014 23:14     EKG Interpretation None      MDM   Final diagnoses:  Volume depletion  Generalized abdominal pain  Elevated liver enzymes   58 year old female with diffuse crampy abdominal pain and possibly some volume depletion secondary to taking magnesium citrate and MiraLAX. CT of the abdomen does not show any acute abnormalities. She does have a moderate-sized esophageal hiatal hernia.  Her labs are pending. Her care is discussed with Dr. Thurnell Garbe as far as her labs are normal she will be discharged home to followup with her primary care physician.    Shaune Pollack, MD 01/30/14 [phone]

## 2014-01-25 NOTE — ED Notes (Signed)
MD at bedside. 

## 2014-01-25 NOTE — ED Notes (Signed)
The pt has been constipated for several days.  C/o weakness she took mirilax with no results.  Weakness from the symptoms.  Last normal bm mondaynauseated.  She has had a headache today and   Feels weak

## 2014-01-25 NOTE — ED Notes (Signed)
Notified CT that PT has consumed both contrast dyes

## 2014-01-25 NOTE — ED Notes (Signed)
The pt was seen at dr [facility] office in [place] and she had lab work drawn this past friday

## 2014-01-25 NOTE — ED Notes (Signed)
RN at bedside for occult stool collection

## 2014-01-26 ENCOUNTER — Other Ambulatory Visit: Payer: Self-pay | Admitting: Family

## 2014-01-26 DIAGNOSIS — R748 Abnormal levels of other serum enzymes: Secondary | ICD-10-CM

## 2014-01-26 LAB — COMPREHENSIVE METABOLIC PANEL
ALT: 73 U/L — ABNORMAL HIGH (ref 0–35)
AST: 44 U/L — AB (ref 0–37)
Albumin: 3.6 g/dL (ref 3.5–5.2)
Alkaline Phosphatase: 120 U/L — ABNORMAL HIGH (ref 39–117)
BUN: 8 mg/dL (ref 6–23)
CALCIUM: 8.9 mg/dL (ref 8.4–10.5)
CO2: 26 mEq/L (ref 19–32)
CREATININE: 0.67 mg/dL (ref 0.50–1.10)
Chloride: 100 mEq/L (ref 96–112)
GFR calc Af Amer: >90 mL/min (ref >90)
Glucose, Bld: 101 mg/dL — ABNORMAL HIGH (ref 70–99)
Potassium: 4.4 mEq/L (ref 3.7–5.3)
Sodium: 138 mEq/L (ref 137–147)
TOTAL PROTEIN: 6.9 g/dL (ref 6.0–8.3)
Total Bilirubin: 0.6 mg/dL (ref 0.3–1.2)

## 2014-01-26 LAB — LIPASE, BLOOD: Lipase: 57 U/L (ref 11–59)

## 2014-01-26 MED ORDER — ONDANSETRON 8 MG PO TBDP: 8.0000 mg | ORAL_TABLET | Freq: Three times a day (TID) | ORAL | Status: DC | PRN

## 2014-01-26 NOTE — ED Notes (Signed)
Pt sitting up in bed, appears comfortable.  All labs resulted, physician aware.

## 2014-01-26 NOTE — ED Provider Notes (Signed)
Received pt at sign out with labs pending. LFT's per baseline. CT scan reassuring. Pt wants to go home now. Dx and testing d/w pt and family.  Questions answered.  Verb understanding, agreeable to d/c home with outpt f/u.   Results for orders placed during the hospital encounter of 01/25/14  CBC WITH DIFFERENTIAL      Result Value Ref Range   WBC 8.2  4.0 - 10.5 K/uL   RBC 4.27  3.87 - 5.11 MIL/uL   Hemoglobin 12.8  12.0 - 15.0 g/dL   HCT 39.4  36.0 - 46.0 %   MCV 92.3  78.0 - 100.0 fL   MCH 30.0  26.0 - 34.0 pg   MCHC 32.5  30.0 - 36.0 g/dL   RDW 13.2  11.5 - 15.5 %   Platelets 194  150 - 400 K/uL   Neutrophils Relative % 61  43 - 77 %   Neutro Abs 5.0  1.7 - 7.7 K/uL   Lymphocytes Relative 27  12 - 46 %   Lymphs Abs 2.2  0.7 - 4.0 K/uL   Monocytes Relative 7  3 - 12 %   Monocytes Absolute 0.6  0.1 - 1.0 K/uL   Eosinophils Relative 5  0 - 5 %   Eosinophils Absolute 0.4  0.0 - 0.7 K/uL   Basophils Relative 0  0 - 1 %   Basophils Absolute 0.0  0.0 - 0.1 K/uL  COMPREHENSIVE METABOLIC PANEL      Result Value Ref Range   Sodium 138  137 - 147 mEq/L   Potassium 4.4  3.7 - 5.3 mEq/L   Chloride 100  96 - 112 mEq/L   CO2 26  19 - 32 mEq/L   Glucose, Bld 101 (*) 70 - 99 mg/dL   BUN 8  6 - 23 mg/dL   Creatinine, Ser 0.67  0.50 - 1.10 mg/dL   Calcium 8.9  8.4 - 10.5 mg/dL   Total Protein 6.9  6.0 - 8.3 g/dL   Albumin 3.6  3.5 - 5.2 g/dL   AST 44 (*) 0 - 37 U/L   ALT 73 (*) 0 - 35 U/L   Alkaline Phosphatase 120 (*) 39 - 117 U/L   Total Bilirubin 0.6  0.3 - 1.2 mg/dL   GFR calc non Af Amer >90  >90 mL/min   GFR calc Af Amer >90  >90 mL/min  LIPASE, BLOOD      Result Value Ref Range   Lipase 57  11 - 59 U/L  URINALYSIS, ROUTINE W REFLEX MICROSCOPIC      Result Value Ref Range   Color, Urine YELLOW  YELLOW   APPearance CLEAR  CLEAR   Specific Gravity, Urine 1.014  1.005 - 1.030   pH 7.5  5.0 - 8.0   Glucose, UA NEGATIVE  NEGATIVE mg/dL   Hgb urine dipstick NEGATIVE  NEGATIVE   Bilirubin Urine NEGATIVE  NEGATIVE   Ketones, ur NEGATIVE  NEGATIVE mg/dL   Protein, ur NEGATIVE  NEGATIVE mg/dL   Urobilinogen, UA 0.2  0.0 - 1.0 mg/dL   Nitrite NEGATIVE  NEGATIVE   Leukocytes, UA NEGATIVE  NEGATIVE  POC OCCULT BLOOD, ED      Result Value Ref Range   Fecal Occult Bld NEGATIVE  NEGATIVE   Ct Abdomen Pelvis Wo Contrast 01/25/2014   CLINICAL DATA:  Constipation for several days. Weakness. Nausea. Headache. Abdominal pain.  EXAM: CT ABDOMEN AND PELVIS WITHOUT CONTRAST  TECHNIQUE: Multidetector CT imaging of the  abdomen and pelvis was performed following the standard protocol without IV contrast.  COMPARISON:  06/30/2013  FINDINGS: Visualized lung bases are clear. Moderate-sized esophageal hiatal hernia.  Surgical absence of the gallbladder. The unenhanced appearance of the liver, spleen, pancreas, adrenal glands, kidneys, abdominal aorta, inferior vena cava, and retroperitoneal lymph nodes is unremarkable. Stomach, small bowel, and colon are not abnormally distended. Scattered stool is present in the colon. There is a linear radiopaque structure demonstrated in the cecum which could represent ingested foreign body. There is no associated bowel wall thickening or infiltration. No free air or free fluid in the abdomen.  Pelvis: Surgical absence of the uterus. No pelvic mass or lymphadenopathy. Appendix is normal. No diverticulitis. No free or loculated pelvic fluid collections. Mild degenerative changes in the spine. No destructive bone lesions appreciated.  IMPRESSION: Moderate-sized esophageal hiatal hernia. No evidence of bowel obstruction. Linear radiopaque structure is present in the cecum which may indicated ingested foreign body.   Electronically Signed   By: Lucienne Capers M.D.   On: 01/25/2014 23:14   Dg Abd 1 View 01/23/2014   CLINICAL DATA:  Right upper quadrant abdominal pain.  EXAM: ABDOMEN - 1 VIEW  COMPARISON:  06/30/2013  FINDINGS: Mild prominence of stool in the proximal  half of the colon. No dilated bowel.  Transitional lumbosacral vertebra. Prior cholecystectomy. No additional significant findings.  IMPRESSION: 1. Mild prominent stool in the proximal half of the colon. Otherwise, no significant abnormalities are observed.   Electronically Signed   By: Sherryl Barters M.D.   On: 01/23/2014 15:41      Alfonzo Feller, DO 01/26/14 0114

## 2014-01-26 NOTE — ED Notes (Signed)
Assisted pt up to RR to void.  Pt ambulated well but just a bit shakey. Pt sitting on side of bed as she is uncomfortable laying down.  Family is at bedside.

## 2014-01-27 ENCOUNTER — Telehealth: Payer: Self-pay | Admitting: Family Medicine

## 2014-01-27 DIAGNOSIS — R1011 Right upper quadrant pain: Secondary | ICD-10-CM

## 2014-01-27 NOTE — Telephone Encounter (Signed)
Patient aware of results and recommendations. She is also requesting a GI referral. She was seen in the ER 2 days ago and was told to f/u with a GI. Referral placed.

## 2014-01-27 NOTE — Telephone Encounter (Signed)
Message copied by Waverly Ferrari on Tue Jan 27, 2014 10:45 AM ------      Message from: Lenna Gilford, [place] A      Created: Mon Jan 26, 2014 12:00 PM       CBC (WBC, Hbg, & Plts) WNL      Kidney  function stable      Liver enzymes elevated-Stop the pravastatin and need repeat CMP in 2 weeks-Orders already in      Pancrease enzymes WNL       ------

## 2014-02-11 ENCOUNTER — Telehealth: Payer: Self-pay | Admitting: Family

## 2014-02-11 NOTE — Telephone Encounter (Signed)
Pt needs CMP redrawn and will decide if levels are still elevated.

## 2014-02-11 NOTE — Telephone Encounter (Signed)
Message copied by Sharion Balloon on Wed Feb 11, 2014  1:01 PM ------      Message from: Priscille Heidelberg      Created: Wed Feb 11, 2014  9:51 AM       PT said she never started Pravastatin. Please advise? ------

## 2014-02-12 ENCOUNTER — Telehealth: Payer: Self-pay | Admitting: Nurse Practitioner

## 2014-02-12 NOTE — Telephone Encounter (Signed)
Referral was made to GI on 01/27/14- please check on this-

## 2014-02-24 NOTE — Telephone Encounter (Signed)
Referral made 01-27-14

## 2014-03-04 ENCOUNTER — Encounter: Payer: Self-pay | Admitting: Family

## 2014-09-18 ENCOUNTER — Other Ambulatory Visit: Payer: Self-pay | Admitting: Family Medicine

## 2014-09-26 ENCOUNTER — Other Ambulatory Visit: Payer: Self-pay | Admitting: Family Medicine

## 2014-12-31 ENCOUNTER — Other Ambulatory Visit: Payer: Self-pay | Admitting: Family Medicine

## 2014-12-31 NOTE — Telephone Encounter (Signed)
Last TSH 10/2013

## 2015-01-01 ENCOUNTER — Other Ambulatory Visit: Payer: Self-pay | Admitting: *Deleted

## 2015-01-08 ENCOUNTER — Ambulatory Visit (INDEPENDENT_AMBULATORY_CARE_PROVIDER_SITE_OTHER): Payer: BLUE CROSS/BLUE SHIELD | Admitting: Physician Assistant

## 2015-01-08 ENCOUNTER — Encounter: Payer: Self-pay | Admitting: Physician Assistant

## 2015-01-08 VITALS — BP 147/93 | HR 83 | Temp 97.5°F | Ht 67.0 in | Wt 186.0 lb

## 2015-01-08 DIAGNOSIS — J309 Allergic rhinitis, unspecified: Secondary | ICD-10-CM | POA: Diagnosis not present

## 2015-01-08 DIAGNOSIS — H60392 Other infective otitis externa, left ear: Secondary | ICD-10-CM | POA: Diagnosis not present

## 2015-01-08 DIAGNOSIS — Z0000 Encounter for general adult medical examination without abnormal findings: Secondary | ICD-10-CM

## 2015-01-08 DIAGNOSIS — E038 Other specified hypothyroidism: Secondary | ICD-10-CM | POA: Diagnosis not present

## 2015-01-08 DIAGNOSIS — H9202 Otalgia, left ear: Secondary | ICD-10-CM | POA: Diagnosis not present

## 2015-01-08 LAB — POCT CBC
Granulocyte percent: 73.8 %G (ref 37–80)
HCT, POC: 44.6 % (ref 37.7–47.9)
Hemoglobin: 13.9 g/dL (ref 12.2–16.2)
LYMPH, POC: 1.9 (ref 0.6–3.4)
MCH, POC: 29 pg (ref 27–31.2)
MCHC: 31.2 g/dL — AB (ref 31.8–35.4)
MCV: 92.9 fL (ref 80–97)
MPV: 7.9 fL (ref 0–99.8)
POC Granulocyte: 7.2 — AB (ref 2–6.9)
POC LYMPH PERCENT: 19.8 %L (ref 10–50)
Platelet Count, POC: 227 10*3/uL (ref 142–424)
RBC: 4.8 M/uL (ref 4.04–5.48)
RDW, POC: 13.4 %
WBC: 9.7 10*3/uL (ref 4.6–10.2)

## 2015-01-08 MED ORDER — CIPROFLOXACIN-DEXAMETHASONE 0.3-0.1 % OT SUSP: 4.0000 [drp] | Freq: Two times a day (BID) | OTIC | Status: DC

## 2015-01-08 MED ORDER — FLUTICASONE PROPIONATE 50 MCG/ACT NA SUSP: 2.0000 | Freq: Every day | NASAL | Status: DC

## 2015-01-08 NOTE — Progress Notes (Signed)
   Subjective:    Patient ID: Thurza Kwiecinski, female    DOB: June 02, 1956, 59 y.o.   MRN: [id number]  HPI 59 y/o female presents for c/o left ear pain x 1 month ago. Pain is intermittent. She has tried Cipro drops that she had left from a prior rx with no relief.     Review of Systems  Constitutional: Negative.   HENT: Positive for ear pain (left ear pain, itching, feeeling like "water is running" ). Negative for congestion, hearing loss and sinus pressure.   All other systems reviewed and are negative.      Objective:   Physical Exam  Constitutional: She is oriented to person, place, and time. She appears well-developed and well-nourished. No distress.  HENT:  Head: Normocephalic.  Right Ear: External ear normal.  Left ear ttp and edematous Cannot visualize left TM  Cardiovascular: Normal rate.   Neurological: She is alert and oriented to person, place, and time.  Skin: She is not diaphoretic.  Psychiatric: She has a normal mood and affect. Her behavior is normal. Judgment and thought content normal.  Nursing note and vitals reviewed.         Assessment & Plan:  1. Otalgia, left   2. Other specified hypothyroidism  - Thyroid Panel With TSH  3. Otitis, externa, infective, left  - ciprofloxacin-dexamethasone (CIPRODEX) otic suspension; Place 4 drops into the left ear 2 (two) times daily.  Dispense: 7.5 mL; Refill: 0  4. Allergic rhinitis, unspecified allergic rhinitis type  - fluticasone (FLONASE) 50 MCG/ACT nasal spray; Place 2 sprays into both nostrils daily.  Dispense: 16 g; Refill: 6  5. Preventative health care  - POCT CBC - CMP14+EGFR   Continue all meds Labs pending Health Maintenance reviewed Diet and exercise encouraged RTO 2 weeks   Tiffany A. Benjamin Stain PA-C

## 2015-01-08 NOTE — Patient Instructions (Signed)
Use otc flonase as directed and use over the plain musinex  I suggest sudafed if you can tolerate it After shower, use hair dryer to try to dry any fluid in your ear.

## 2015-01-09 ENCOUNTER — Other Ambulatory Visit: Payer: Self-pay | Admitting: Physician Assistant

## 2015-01-09 DIAGNOSIS — R7989 Other specified abnormal findings of blood chemistry: Secondary | ICD-10-CM

## 2015-01-09 LAB — THYROID PANEL WITH TSH
Free Thyroxine Index: 2.3 (ref 1.2–4.9)
T3 Uptake Ratio: 29 % (ref 24–39)
T4, Total: 8 ug/dL (ref 4.5–12.0)
TSH: 9.77 u[IU]/mL — ABNORMAL HIGH (ref 0.450–4.500)

## 2015-01-09 LAB — CMP14+EGFR
ALT: 15 [IU]/L (ref 0–32)
AST: 18 [IU]/L (ref 0–40)
Albumin/Globulin Ratio: 1.8 (ref 1.1–2.5)
Albumin: 4.2 g/dL (ref 3.6–4.8)
Alkaline Phosphatase: 79 [IU]/L (ref 39–117)
BUN/Creatinine Ratio: 16 (ref 11–26)
BUN: 12 mg/dL (ref 8–27)
Bilirubin Total: 0.4 mg/dL (ref 0.0–1.2)
CO2: 25 mmol/L (ref 18–29)
Calcium: 9.2 mg/dL (ref 8.7–10.3)
Chloride: 103 mmol/L (ref 97–108)
Creatinine, Ser: 0.74 mg/dL (ref 0.57–1.00)
GFR calc Af Amer: 101 mL/min/{1.73_m2}
GFR calc non Af Amer: 88 mL/min/{1.73_m2}
Globulin, Total: 2.4 g/dL (ref 1.5–4.5)
Glucose: 85 mg/dL (ref 65–99)
Potassium: 4.3 mmol/L (ref 3.5–5.2)
Sodium: 142 mmol/L (ref 134–144)
Total Protein: 6.6 g/dL (ref 6.0–8.5)

## 2015-01-11 ENCOUNTER — Telehealth: Payer: Self-pay | Admitting: Physician Assistant

## 2015-01-11 ENCOUNTER — Other Ambulatory Visit: Payer: Self-pay | Admitting: Physician Assistant

## 2015-01-11 NOTE — Telephone Encounter (Signed)
Is this ok to do? Patient is coming in on Wednesday to repeat thyroid. Please advise and route to [facility] A

## 2015-01-11 NOTE — Telephone Encounter (Signed)
Yes, this will be fine. I will put in referral. Is there a particular endocrinologist that she wants to see? Thanks Petra Sargeant A. Benjamin Stain PA-C

## 2015-01-11 NOTE — Telephone Encounter (Signed)
Discussed labs with patient. Will come back in on Wednesday for a repeat. Patient states that if level is still elevated she may want to be referred to a thyroid specialist. Advised that we would be happy to do that if she wished to do that

## 2015-01-12 ENCOUNTER — Telehealth: Payer: Self-pay | Admitting: *Deleted

## 2015-01-12 ENCOUNTER — Other Ambulatory Visit: Payer: Self-pay | Admitting: Physician Assistant

## 2015-01-12 DIAGNOSIS — H6692 Otitis media, unspecified, left ear: Secondary | ICD-10-CM

## 2015-01-12 MED ORDER — AZITHROMYCIN 250 MG PO TABS: ORAL_TABLET | ORAL | Status: DC

## 2015-01-12 NOTE — Telephone Encounter (Signed)
Patient is wanting a rx for a zpak she is not feeling any better

## 2015-01-12 NOTE — Telephone Encounter (Signed)
Prescription sent to pharmacy . If symptoms continue she needs to follow up in office.   Jannine Abreu A. Benjamin Stain PA-C

## 2015-01-12 NOTE — Telephone Encounter (Signed)
Patient aware that we will set up referral.

## 2015-01-12 NOTE — Telephone Encounter (Signed)
Pt.notified

## 2015-01-12 NOTE — Telephone Encounter (Signed)
Please advise and route to [facility] 

## 2015-01-12 NOTE — Telephone Encounter (Signed)
Prescription sent to pharmacy  Sharita Bienaime A. Jacobus Colvin PA-C   

## 2015-01-12 NOTE — Telephone Encounter (Signed)
Pt notified of RX Verbalizes understanding 

## 2015-01-14 ENCOUNTER — Encounter: Payer: Self-pay | Admitting: *Deleted

## 2015-01-20 ENCOUNTER — Encounter: Payer: Self-pay | Admitting: Physician Assistant

## 2015-01-20 ENCOUNTER — Ambulatory Visit (INDEPENDENT_AMBULATORY_CARE_PROVIDER_SITE_OTHER): Payer: BLUE CROSS/BLUE SHIELD | Admitting: Physician Assistant

## 2015-01-20 VITALS — BP 130/79 | HR 96 | Temp 97.2°F | Ht 67.0 in | Wt 193.0 lb

## 2015-01-20 DIAGNOSIS — E039 Hypothyroidism, unspecified: Secondary | ICD-10-CM

## 2015-01-20 DIAGNOSIS — H60392 Other infective otitis externa, left ear: Secondary | ICD-10-CM | POA: Diagnosis not present

## 2015-01-20 NOTE — Patient Instructions (Signed)
Apply a small amt of hydrocortisone cream to finger and rub on external ear for itch Take Zyrtec 10mg  daily Lean head forward when using nasal spray

## 2015-01-20 NOTE — Progress Notes (Signed)
   Subjective:    Patient ID: Joniece Smotherman, female    DOB: 1956/04/03, 59 y.o.   MRN: [id number]  HPI 59 y/o female presetns for f/u of otitis externa. She states that she is much improved but still has slight pain or "irritated feeling" in that ear.     Review of Systems  HENT: Negative for ear discharge, ear pain and sinus pressure.        Patient questions bug in her ear Ear feels "itchy" with mild pressure in the eustachian tube area  All other systems reviewed and are negative.      Objective:   Physical Exam  Constitutional: She appears well-developed and well-nourished. No distress.  HENT:  Right Ear: External ear normal.  Much improvement in edema of ear canal and negative ttp of external left auricle. Slightly difficult to visualize left TM, appears hazy.   Pulmonary/Chest: Effort normal.  Skin: She is not diaphoretic.  Nursing note and vitals reviewed.         Assessment & Plan:  1. Hypothyroidism, unspecified hypothyroidism type At patients request - Ambulatory referral to Endocrinology  2. Otitis, externa, infective, left - Otitis externa resolved - Ear lavage performed to remove possible FB in ear at patient request   Continue zyrtec 10mg  q day Continue flonase Can use otc hydrocortisone on external ear for itch    F/U if s/s worsen or do not improve   Tiffany A. Benjamin Stain PA-C

## 2015-02-25 ENCOUNTER — Telehealth: Payer: Self-pay | Admitting: Physician Assistant

## 2015-02-25 DIAGNOSIS — E039 Hypothyroidism, unspecified: Secondary | ICD-10-CM

## 2015-02-25 NOTE — Telephone Encounter (Signed)
Tiffany, would this be ok  i can refer to endocrinology, if you approve.

## 2015-02-26 NOTE — Telephone Encounter (Signed)
Yes, we had talked about a referral and she was going to let us know where she wanted to go. Thank you Lavena Bullion A. Benjamin Stain PA-C

## 2015-03-02 NOTE — Telephone Encounter (Signed)
Pt notified that referral has been sent

## 2015-03-17 ENCOUNTER — Telehealth: Payer: Self-pay | Admitting: Physician Assistant

## 2015-03-18 NOTE — Telephone Encounter (Signed)
Patient received a list of her allergies tomorrow.

## 2015-03-26 ENCOUNTER — Other Ambulatory Visit: Payer: Self-pay | Admitting: Physician Assistant

## 2015-03-26 MED ORDER — FLUCONAZOLE 150 MG PO TABS: 150.0000 mg | ORAL_TABLET | Freq: Once | ORAL | Status: DC

## 2015-03-26 NOTE — Telephone Encounter (Signed)
Pt aware and approved by T Benjamin Stain

## 2015-08-03 ENCOUNTER — Encounter: Payer: Self-pay | Admitting: Physician Assistant

## 2015-08-03 ENCOUNTER — Ambulatory Visit (INDEPENDENT_AMBULATORY_CARE_PROVIDER_SITE_OTHER): Payer: BLUE CROSS/BLUE SHIELD | Admitting: Physician Assistant

## 2015-08-03 VITALS — BP 136/93 | HR 77 | Temp 97.3°F | Ht 67.0 in | Wt 188.0 lb

## 2015-08-03 DIAGNOSIS — J209 Acute bronchitis, unspecified: Secondary | ICD-10-CM

## 2015-08-03 MED ORDER — AZITHROMYCIN 250 MG PO TABS: ORAL_TABLET | ORAL | Status: DC

## 2015-08-03 MED ORDER — FLUCONAZOLE 150 MG PO TABS: 150.0000 mg | ORAL_TABLET | Freq: Once | ORAL | Status: DC

## 2015-08-03 NOTE — Patient Instructions (Signed)

## 2015-08-03 NOTE — Progress Notes (Signed)
Subjective:     Patient ID: Cynthia Chavez, female   DOB: May 27, 1956, 59 y.o.   MRN: [id number]  HPI Pt with cough and prod cough for 4 days Also with R ear pain  Review of Systems  Constitutional: Positive for activity change and fatigue. Negative for fever, chills, diaphoresis and appetite change.  HENT: Positive for congestion, postnasal drip and sinus pressure. Negative for sneezing and sore throat.   Respiratory: Positive for cough and wheezing. Negative for shortness of breath.   Cardiovascular: Negative.        Objective:   Physical Exam  Constitutional: She appears well-developed and well-nourished.  HENT:  Right Ear: External ear normal.  Left Ear: External ear normal.  Mouth/Throat: Oropharyngeal exudate present.  Fluid behind R TM but landmarks remain  Neck: Neck supple.  Cardiovascular: Normal rate, regular rhythm and normal heart sounds.   No murmur heard. Pulmonary/Chest: Effort normal. No respiratory distress. She has no rales. She exhibits no tenderness.  Coarse lungs sounds bilat Clear well with cough  Lymphadenopathy:    She has no cervical adenopathy.  Nursing note and vitals reviewed.      Assessment:     Bronchitis    Plan:     Fluids Rest Zithromax Diflucan due to hx of vaginitis following ATB OTC Mucinex F/U prn

## 2016-01-18 ENCOUNTER — Encounter: Payer: Self-pay | Admitting: Physician Assistant

## 2016-01-18 ENCOUNTER — Ambulatory Visit (INDEPENDENT_AMBULATORY_CARE_PROVIDER_SITE_OTHER): Payer: BLUE CROSS/BLUE SHIELD | Admitting: Physician Assistant

## 2016-01-18 VITALS — BP 134/85 | HR 82 | Temp 98.3°F | Ht 67.0 in | Wt 184.0 lb

## 2016-01-18 DIAGNOSIS — E039 Hypothyroidism, unspecified: Secondary | ICD-10-CM | POA: Diagnosis not present

## 2016-01-18 DIAGNOSIS — N898 Other specified noninflammatory disorders of vagina: Secondary | ICD-10-CM | POA: Diagnosis not present

## 2016-01-18 DIAGNOSIS — R300 Dysuria: Secondary | ICD-10-CM | POA: Diagnosis not present

## 2016-01-18 LAB — URINALYSIS, COMPLETE
GLUCOSE, UA: NEGATIVE
Leukocytes, UA: NEGATIVE
Nitrite, UA: NEGATIVE
Specific Gravity, UA: >1.03 — ABNORMAL HIGH (ref 1.005–1.030)
Urobilinogen, Ur: 0.2 mg/dL (ref 0.2–1.0)
pH, UA: 5.5 (ref 5.0–7.5)

## 2016-01-18 LAB — MICROSCOPIC EXAMINATION

## 2016-01-18 LAB — WET PREP FOR TRICH, YEAST, CLUE
Clue Cell Exam: NEGATIVE
Trichomonas Exam: NEGATIVE
Yeast Exam: NEGATIVE

## 2016-01-18 NOTE — Progress Notes (Signed)
Subjective:     Patient ID: Cynthia Chavez, female   DOB: 02-15-1956, 60 y.o.   MRN: [id number]  HPI Pt here with two concerns  #1- redness and swelling to the inguinal region She states she has a yeast infection but denies any vaginal discharge Tried OTC Vagisil and made sx worse #2- Pt needing rf of thyroid meds Referred to specialist last yr who recommended cont current tx  Review of Systems  Constitutional: Negative.   Respiratory: Negative.   Gastrointestinal: Negative.   Endocrine: Negative.   Genitourinary: Positive for dysuria. Negative for urgency, frequency, hematuria, flank pain, decreased urine volume, vaginal bleeding, vaginal discharge, difficulty urinating, genital sores, menstrual problem and dyspareunia.  Skin: Positive for color change and rash.       Objective:   Physical Exam  Constitutional: She appears well-developed and well-nourished.  HENT:  Mouth/Throat: Oropharynx is clear and moist. No oropharyngeal exudate.  Neck: Neck supple. No JVD present. No thyromegaly present.  Cardiovascular: Normal rate, regular rhythm and normal heart sounds.   No murmur heard. Pulmonary/Chest: Effort normal and breath sounds normal. She has no wheezes.  Abdominal: Bowel sounds are normal. She exhibits no distension and no mass. There is no tenderness. There is no rebound and no guarding.  Lymphadenopathy:    She has no cervical adenopathy.  Skin: Skin is warm and dry.  Nursing note and vitals reviewed. UA,wet prep- see results CMP and TSH pending     Assessment:     1. Vaginal discharge   2. Burning with urination   3. Hypothyroidism, unspecified hypothyroidism type        Plan:     Most of her sx are coming from reaction to the vagisil and probable tinea infection Hold further Vagisil use OTC Clotrimazole Keep the area clean and dry Will inform on the rest of lab results F/U pending labs

## 2016-01-18 NOTE — Patient Instructions (Signed)
Hypothyroidism Hypothyroidism is a disorder of the thyroid. The thyroid is a large gland that is located in the lower front of the neck. The thyroid releases hormones that control how the body works. With hypothyroidism, the thyroid does not make enough of these hormones. CAUSES Causes of hypothyroidism may include:  Viral infections.  Pregnancy.  Your own defense system (immune system) attacking your thyroid.  Certain medicines.  Birth defects.  Past radiation treatments to your head or neck.  Past treatment with radioactive iodine.  Past surgical removal of part or all of your thyroid.  Problems with the gland that is located in the center of your brain (pituitary). SIGNS AND SYMPTOMS Signs and symptoms of hypothyroidism may include:  Feeling as though you have no energy (lethargy).  Inability to tolerate cold.  Weight gain that is not explained by a change in diet or exercise habits.  Dry skin.  Coarse hair.  Menstrual irregularity.  Slowing of thought processes.  Constipation.  Sadness or depression. DIAGNOSIS  Your health care provider may diagnose hypothyroidism with blood tests and ultrasound tests. TREATMENT Hypothyroidism is treated with medicine that replaces the hormones that your body does not make. After you begin treatment, it may take several weeks for symptoms to go away. HOME CARE INSTRUCTIONS   Take medicines only as directed by your health care provider.  If you start taking any new medicines, tell your health care provider.  Keep all follow-up visits as directed by your health care provider. This is important. As your condition improves, your dosage needs may change. You will need to have blood tests regularly so that your health care provider can watch your condition. SEEK MEDICAL CARE IF:  Your symptoms do not get better with treatment.  You are taking thyroid replacement medicine and:  You sweat excessively.  You have tremors.  You  feel anxious.  You lose weight rapidly.  You cannot tolerate heat.  You have emotional swings.  You have diarrhea.  You feel weak. SEEK IMMEDIATE MEDICAL CARE IF:   You develop chest pain.  You develop an irregular heartbeat.  You develop a rapid heartbeat.   This information is not intended to replace advice given to you by your health care provider. Make sure you discuss any questions you have with your health care provider.   Document Released: 07/24/2005 Document Revised: 08/14/2014 Document Reviewed: 12/09/2013 Elsevier Interactive Patient Education 2016 [place] Athlete's foot (tinea pedis) is a fungal infection of the skin on the feet. It often occurs on the skin between the toes or underneath the toes. It can also occur on the soles of the feet. Athlete's foot is more likely to occur in hot, humid weather. Not washing your feet or changing your socks often enough can contribute to athlete's foot. The infection can spread from person to person (contagious). CAUSES Athlete's foot is caused by a fungus. This fungus thrives in warm, moist places. Most people get athlete's foot by sharing shower stalls, towels, and wet floors with an infected person. People with weakened immune systems, including those with diabetes, may be more likely to get athlete's foot. SYMPTOMS   Itchy areas between the toes or on the soles of the feet.  White, flaky, or scaly areas between the toes or on the soles of the feet.  Tiny, intensely itchy blisters between the toes or on the soles of the feet.  Tiny cuts on the skin. These cuts can develop a bacterial infection.  Thick  or discolored toenails. DIAGNOSIS  Your caregiver can usually tell what the problem is by doing a physical exam. Your caregiver may also take a skin sample from the rash area. The skin sample may be examined under a microscope, or it may be tested to see if fungus will grow in the sample. A sample may also  be taken from your toenail for testing. TREATMENT  Over-the-counter and prescription medicines can be used to kill the fungus. These medicines are available as powders or creams. Your caregiver can suggest medicines for you. Fungal infections respond slowly to treatment. You may need to continue using your medicine for several weeks. PREVENTION   Do not share towels.  Wear sandals in wet areas, such as shared locker rooms and shared showers.  Keep your feet dry. Wear shoes that allow air to circulate. Wear cotton or wool socks. HOME CARE INSTRUCTIONS   Take medicines as directed by your caregiver. Do not use steroid creams on athlete's foot.  Keep your feet clean and cool. Wash your feet daily and dry them thoroughly, especially between your toes.  Change your socks every day. Wear cotton or wool socks. In hot climates, you may need to change your socks 2 to 3 times per day.  Wear sandals or canvas tennis shoes with good air circulation.  If you have blisters, soak your feet in Burow's solution or Epsom salts for 20 to 30 minutes, 2 times a day to dry out the blisters. Make sure you dry your feet thoroughly afterward. SEEK MEDICAL CARE IF:   You have a fever.  You have swelling, soreness, warmth, or redness in your foot.  You are not getting better after 7 days of treatment.  You are not completely cured after 30 days.  You have any problems caused by your medicines. MAKE SURE YOU:   Understand these instructions.  Will watch your condition.  Will get help right away if you are not doing well or get worse.   This information is not intended to replace advice given to you by your health care provider. Make sure you discuss any questions you have with your health care provider.   Document Released: 07/21/2000 Document Revised: 10/16/2011 Document Reviewed: 01/25/2015 Elsevier Interactive Patient Education [employer].

## 2016-01-19 ENCOUNTER — Telehealth: Payer: Self-pay | Admitting: Physician Assistant

## 2016-01-19 LAB — CMP14+EGFR
ALT: 14 [IU]/L (ref 0–32)
AST: 15 [IU]/L (ref 0–40)
Albumin/Globulin Ratio: 1.5 (ref 1.2–2.2)
Albumin: 4 g/dL (ref 3.6–4.8)
Alkaline Phosphatase: 88 [IU]/L (ref 39–117)
BUN/Creatinine Ratio: 14 (ref 12–28)
BUN: 10 mg/dL (ref 8–27)
Bilirubin Total: 0.5 mg/dL (ref 0.0–1.2)
CO2: 23 mmol/L (ref 18–29)
Calcium: 9.2 mg/dL (ref 8.7–10.3)
Chloride: 102 mmol/L (ref 96–106)
Creatinine, Ser: 0.72 mg/dL (ref 0.57–1.00)
GFR calc Af Amer: 104 mL/min/{1.73_m2}
GFR calc non Af Amer: 90 mL/min/{1.73_m2}
Globulin, Total: 2.6 g/dL (ref 1.5–4.5)
Glucose: 104 mg/dL — ABNORMAL HIGH (ref 65–99)
Potassium: 4.2 mmol/L (ref 3.5–5.2)
Sodium: 140 mmol/L (ref 134–144)
Total Protein: 6.6 g/dL (ref 6.0–8.5)

## 2016-01-19 LAB — TSH: TSH: 3.17 u[IU]/mL (ref 0.450–4.500)

## 2016-01-19 NOTE — Telephone Encounter (Signed)
She can use some OTC hydrocortisone when she puts on the Clotrimazole

## 2016-01-19 NOTE — Telephone Encounter (Signed)
Please call.   Labs look good.  The wet prep showed no yeast, clue cells or trich.   Patient can use OTC medications for relief.  Hydrocortisone and clotrimazole.

## 2016-01-25 ENCOUNTER — Ambulatory Visit: Payer: BLUE CROSS/BLUE SHIELD | Admitting: Family

## 2016-01-28 ENCOUNTER — Other Ambulatory Visit: Payer: Self-pay | Admitting: *Deleted

## 2016-01-28 MED ORDER — LEVOTHYROXINE SODIUM 125 MCG PO TABS: 125.0000 ug | ORAL_TABLET | Freq: Every day | ORAL | Status: DC

## 2016-02-02 NOTE — Telephone Encounter (Signed)
Several attempts have been made to contact patient this encounter will be closed.  

## 2016-02-04 ENCOUNTER — Other Ambulatory Visit: Payer: Self-pay | Admitting: Family Medicine

## 2016-02-04 ENCOUNTER — Other Ambulatory Visit: Payer: Self-pay | Admitting: Physician Assistant

## 2016-02-04 MED ORDER — LEVOTHYROXINE SODIUM 125 MCG PO TABS: 125.0000 ug | ORAL_TABLET | Freq: Every day | ORAL | Status: DC

## 2016-02-25 ENCOUNTER — Telehealth: Payer: Self-pay | Admitting: Pediatrics

## 2016-02-25 NOTE — Telephone Encounter (Signed)
denied °

## 2016-03-06 ENCOUNTER — Encounter: Payer: Self-pay | Admitting: Family Medicine

## 2016-03-06 ENCOUNTER — Other Ambulatory Visit: Payer: Self-pay | Admitting: Family Medicine

## 2016-03-06 ENCOUNTER — Ambulatory Visit (INDEPENDENT_AMBULATORY_CARE_PROVIDER_SITE_OTHER): Payer: BLUE CROSS/BLUE SHIELD | Admitting: Family Medicine

## 2016-03-06 ENCOUNTER — Telehealth: Payer: Self-pay | Admitting: Pediatrics

## 2016-03-06 VITALS — BP 130/79 | HR 71 | Temp 97.1°F | Ht 67.0 in | Wt 182.0 lb

## 2016-03-06 DIAGNOSIS — J0100 Acute maxillary sinusitis, unspecified: Secondary | ICD-10-CM

## 2016-03-06 MED ORDER — CIPROFLOXACIN-HYDROCORTISONE 0.2-1 % OT SUSP: 3.0000 [drp] | Freq: Two times a day (BID) | OTIC | 0 refills | Status: DC

## 2016-03-06 MED ORDER — NEOMYCIN-POLYMYXIN-HC 3.5-10000-1 OT SOLN: 4.0000 [drp] | Freq: Four times a day (QID) | OTIC | 0 refills | Status: DC

## 2016-03-06 MED ORDER — AMOXICILLIN-POT CLAVULANATE 875-125 MG PO TABS: 1.0000 | ORAL_TABLET | Freq: Two times a day (BID) | ORAL | 0 refills | Status: DC

## 2016-03-06 NOTE — Telephone Encounter (Signed)
Pharmacy notified of RX. 

## 2016-03-06 NOTE — Telephone Encounter (Signed)
can pt use polymixin drops instead? It is much cheaper.

## 2016-03-06 NOTE — Patient Instructions (Signed)
Great to see you today!  Today's diagnosis is sinusitis and left otitis externa(infection of the outer portion of the left ear).  Continue your Mucinex.  I have sent in a prescription antibiotic and some eardrops.  Best regards, [employer].

## 2016-03-06 NOTE — Telephone Encounter (Signed)
The requested med has been sent to the pharmacy.  This one should be much cheaper, but she needs to put the drops in 4 times a day. It is important that she lay with the left side up for 15 minutes to allow the dros to soak in before shemoves and the excess runs out. Please let the patient know. Thanks, WS

## 2016-03-06 NOTE — Progress Notes (Signed)
   Subjective:  Patient ID: Cynthia Chavez, female    DOB: 1955-12-17  Age: 60 y.o. MRN: [id number]  CC: Sinusitis (sinus pressure, L ear pain started on Sat)   HPI Cynthia Chavez presents for Symptoms include congestion, facial pain, nasal congestion, no  fever, non productive cough, post nasal drip and sinus pressure with no fever, chills, night sweats or weight loss. Onset of symptoms was a few days ago, gradually worsening since that time. Pt.is drinking moderate amounts of fluids.     History Cloye has a past medical history of Edema; Malaise; Thyroid disease; and Varicose veins.   She has a past surgical history that includes Cholecystectomy; Abdominal hysterectomy; and Esophageal dilation.   Her family history includes COPD in her father; Cancer in her father.She reports that she has been smoking Cigarettes.  She started smoking about 22 years ago. She does not have any smokeless tobacco history on file. She reports that she does not drink alcohol or use drugs.  Current Outpatient Prescriptions on File Prior to Visit  Medication Sig Dispense Refill  . aspirin EC 81 MG tablet Take 81 mg by mouth daily.    . fluticasone (FLONASE) 50 MCG/ACT nasal spray USE TWO SPRAY(S) IN EACH NOSTRIL ONCE DAILY 16 g 1  . levothyroxine (SYNTHROID, LEVOTHROID) 125 MCG tablet Take 1 tablet (125 mcg total) by mouth daily before breakfast. 90 tablet 1   No current facility-administered medications on file prior to visit.     ROS Review of Systems  Constitutional: Negative for activity change, appetite change, chills and fever.  HENT: Positive for congestion, postnasal drip, rhinorrhea and sinus pressure. Negative for ear discharge, ear pain, hearing loss, nosebleeds, sneezing and trouble swallowing.   Respiratory: Negative for cough, chest tightness and shortness of breath.   Cardiovascular: Negative for chest pain and palpitations.  Skin: Negative for rash.    Objective:  BP 130/79 (BP Location: Left  Arm, Patient Position: Sitting, Cuff Size: Normal)   Pulse 71   Temp 97.1 F (36.2 C) (Oral)   Ht 5\' 7"  (1.702 m)   Wt 182 lb (82.6 kg)   SpO2 99%   BMI 28.51 kg/m   Physical Exam  Constitutional: She appears well-developed and well-nourished.  HENT:  Head: Normocephalic and atraumatic.  Right Ear: Tympanic membrane and external ear normal. No decreased hearing is noted.  Left Ear: There is tenderness. A middle ear effusion is present. No decreased hearing is noted.  Nose: Mucosal edema present. Right sinus exhibits no frontal sinus tenderness. Left sinus exhibits no frontal sinus tenderness.  Mouth/Throat: No oropharyngeal exudate or posterior oropharyngeal erythema.  Neck: No Brudzinski's sign noted.  Pulmonary/Chest: Breath sounds normal. No respiratory distress.  Lymphadenopathy:       Head (right side): No preauricular adenopathy present.       Head (left side): No preauricular adenopathy present.       Right cervical: No superficial cervical adenopathy present.      Left cervical: No superficial cervical adenopathy present.    Assessment & Plan:   There are no diagnoses linked to this encounter. I am having [name] maintain her aspirin EC, fluticasone, and levothyroxine.  No orders of the defined types were placed in this encounter.    Follow-up: No Follow-up on file.  Claretta Fraise, M.D.

## 2016-03-07 ENCOUNTER — Telehealth: Payer: Self-pay | Admitting: Family Medicine

## 2016-03-07 NOTE — Telephone Encounter (Signed)
Patient states that the augmentin is making the patient feel nausea and giving her abdominal pain. Patient states that she has been taking it with food. Patient would like to know if something else can be called in that is not as hard on her stomach? Please advise

## 2016-03-07 NOTE — Telephone Encounter (Signed)
Called back and discussed.

## 2016-07-10 ENCOUNTER — Ambulatory Visit (INDEPENDENT_AMBULATORY_CARE_PROVIDER_SITE_OTHER): Payer: BLUE CROSS/BLUE SHIELD | Admitting: Pediatrics

## 2016-07-10 ENCOUNTER — Encounter: Payer: Self-pay | Admitting: Pediatrics

## 2016-07-10 VITALS — BP 139/85 | HR 76 | Temp 97.9°F | Ht 67.0 in | Wt 185.0 lb

## 2016-07-10 DIAGNOSIS — Z1231 Encounter for screening mammogram for malignant neoplasm of breast: Secondary | ICD-10-CM

## 2016-07-10 DIAGNOSIS — R030 Elevated blood-pressure reading, without diagnosis of hypertension: Secondary | ICD-10-CM | POA: Diagnosis not present

## 2016-07-10 DIAGNOSIS — N6310 Unspecified lump in the right breast, unspecified quadrant: Secondary | ICD-10-CM | POA: Diagnosis not present

## 2016-07-10 DIAGNOSIS — Z1239 Encounter for other screening for malignant neoplasm of breast: Secondary | ICD-10-CM

## 2016-07-10 DIAGNOSIS — H938X3 Other specified disorders of ear, bilateral: Secondary | ICD-10-CM | POA: Diagnosis not present

## 2016-07-10 DIAGNOSIS — N6315 Unspecified lump in the right breast, overlapping quadrants: Secondary | ICD-10-CM

## 2016-07-10 MED ORDER — CETIRIZINE HCL 10 MG PO TABS: 10.0000 mg | ORAL_TABLET | Freq: Every day | ORAL | 11 refills | Status: DC

## 2016-07-10 NOTE — Progress Notes (Signed)
  Subjective:   Patient ID: Cynthia Chavez, female    DOB: 1956-07-31, 60 y.o.   MRN: [id number] CC: EDEMA BREAST (rIGHT)  HPI: Cynthia Chavez is a 60 y.o. female presenting for EDEMA BREAST (rIGHT)  Blew leaves 1.5 weeks ago Has been fighting congestion since then Taking mucinex, flonase No nipple discharge One day last week had red nipple, then got better No tenderness 6 days ago noticed breast hardness, no improvement  Relevant past medical, surgical, family and social history reviewed. Allergies and medications reviewed and updated. History  Smoking Status  . Current Every Day Smoker  . Types: Cigarettes  . Start date: 04/19/1993  Smokeless Tobacco  . Never Used   ROS: Per HPI   Objective:    BP (!) 144/88   Pulse 75   Temp 97.9 F (36.6 C) (Oral)   Ht 5\' 7"  (1.702 m)   Wt 185 lb (83.9 kg)   BMI 28.98 kg/m   Wt Readings from Last 3 Encounters:  07/10/16 185 lb (83.9 kg)  03/06/16 182 lb (82.6 kg)  01/18/16 184 lb (83.5 kg)    Gen: NAD, alert, cooperative with exam, NCAT EYES: EOMI, no conjunctival injection, or no icterus ENT: slightly pink TM b/l CV: NRRR, normal S1/S2 Resp: CTABL, no wheezes, normal WOB Neuro: Alert and oriented, strength equal b/l UE and LE, coordination grossly normal MSK: normal muscle bulk Breast: R breast with 3 cm not well defined hardness in 12oclock position over nipple. No nipple involvement, nl L breast exam  Assessment & Plan:  Caila was seen today for edema breast.  Diagnoses and all orders for this visit:  Breast lump on right side at 12 o'clock position New palpable lump -     MM Digital Diagnostic Unilat R; Future  Screening for breast cancer -     MM Digital Screening Unilat L; Future  Congestion of both ears -     cetirizine (ZYRTEC) 10 MG tablet; Take 1 tablet (10 mg total) by mouth daily.  Elevated blood pressure reading Recheck next visit  Follow up plan: Return in about 3 months (around 10/08/2016) for med follow  up. Assunta Found, MD [place]

## 2016-07-14 ENCOUNTER — Other Ambulatory Visit: Payer: Self-pay | Admitting: Pediatrics

## 2016-07-14 ENCOUNTER — Telehealth: Payer: Self-pay | Admitting: Pediatrics

## 2016-07-14 DIAGNOSIS — N6310 Unspecified lump in the right breast, unspecified quadrant: Secondary | ICD-10-CM

## 2016-07-14 NOTE — Telephone Encounter (Signed)
Pt aware of appointment date/time 

## 2016-07-21 ENCOUNTER — Encounter: Payer: Self-pay | Admitting: Pediatrics

## 2016-07-21 ENCOUNTER — Telehealth: Payer: Self-pay | Admitting: Pediatrics

## 2016-07-21 ENCOUNTER — Ambulatory Visit (INDEPENDENT_AMBULATORY_CARE_PROVIDER_SITE_OTHER): Payer: BLUE CROSS/BLUE SHIELD | Admitting: Pediatrics

## 2016-07-21 VITALS — BP 144/87 | HR 72 | Temp 97.2°F | Ht 67.0 in | Wt 184.0 lb

## 2016-07-21 DIAGNOSIS — L0390 Cellulitis, unspecified: Secondary | ICD-10-CM

## 2016-07-21 MED ORDER — CEPHALEXIN 500 MG PO CAPS: 500.0000 mg | ORAL_CAPSULE | Freq: Two times a day (BID) | ORAL | 0 refills | Status: DC

## 2016-07-21 NOTE — Telephone Encounter (Signed)
Can come in now or be seen in clinic tomorrow

## 2016-07-21 NOTE — Telephone Encounter (Signed)
Advise if you will send in any medication for patient.

## 2016-07-21 NOTE — Progress Notes (Signed)
  Subjective:   Patient ID: Cynthia Chavez, female    DOB: 19-Apr-1956, 60 y.o.   MRN: [id number] CC: Breast redness  HPI: Cynthia Chavez is a 60 y.o. female presenting for Breast redness  Two days ago started having more redness and pain in R breast Has a lump in R breast, has diagnostic mammo scheduled for Monday No fevers or chills Pain increasing in R breast, last night had hard time sleeping  Relevant past medical, surgical, family and social history reviewed. Allergies and medications reviewed and updated. History  Smoking Status  . Current Every Day Smoker  . Types: Cigarettes  . Start date: 04/19/1993  Smokeless Tobacco  . Never Used   ROS: Per HPI   Objective:    BP (!) 144/87   Pulse 72   Temp 97.2 F (36.2 C) (Oral)   Ht 5\' 7"  (1.702 m)   Wt 184 lb (83.5 kg)   BMI 28.82 kg/m   Wt Readings from Last 3 Encounters:  07/21/16 184 lb (83.5 kg)  07/10/16 185 lb (83.9 kg)  03/06/16 182 lb (82.6 kg)    Gen: NAD, alert, cooperative with exam, NCAT EYES: EOMI, no conjunctival injection, or no icterus CV: WWP Resp: normal WOB Neuro: Alert and oriented\ Breast: R breast with 3cm nodule upper R breast. Skin around R breast including nipple slightly pink. No fluctuance, mildly tender with palpation   Assessment & Plan:  Cynthia Chavez was seen today for breast redness.  Diagnoses and all orders for this visit:  Cellulitis, R breast Keflex 500mg  BID for 7 days  Follow up plan: As needed Assunta Found, MD [place]

## 2016-07-24 ENCOUNTER — Other Ambulatory Visit: Payer: Self-pay | Admitting: Pediatrics

## 2016-07-24 ENCOUNTER — Ambulatory Visit
Admission: RE | Admit: 2016-07-24 | Discharge: 2016-07-24 | Disposition: A | Discharge status: Home / Self Care | Payer: PRIVATE HEALTH INSURANCE | Source: Ambulatory Visit | Attending: Pediatrics | Admitting: Pediatrics

## 2016-07-24 ENCOUNTER — Ambulatory Visit
Admission: RE | Admit: 2016-07-24 | Discharge: 2016-07-24 | Disposition: A | Discharge status: Home / Self Care | Payer: Self-pay | Source: Ambulatory Visit | Attending: Pediatrics | Admitting: Pediatrics

## 2016-07-24 DIAGNOSIS — R599 Enlarged lymph nodes, unspecified: Secondary | ICD-10-CM

## 2016-07-24 DIAGNOSIS — N6310 Unspecified lump in the right breast, unspecified quadrant: Secondary | ICD-10-CM

## 2016-07-27 ENCOUNTER — Ambulatory Visit
Admission: RE | Admit: 2016-07-27 | Discharge: 2016-07-27 | Disposition: A | Discharge status: Home / Self Care | Payer: PRIVATE HEALTH INSURANCE | Source: Ambulatory Visit | Attending: Pediatrics | Admitting: Pediatrics

## 2016-07-27 ENCOUNTER — Other Ambulatory Visit: Payer: Self-pay | Admitting: Pediatrics

## 2016-07-27 DIAGNOSIS — N6310 Unspecified lump in the right breast, unspecified quadrant: Secondary | ICD-10-CM

## 2016-07-27 DIAGNOSIS — R599 Enlarged lymph nodes, unspecified: Secondary | ICD-10-CM

## 2016-08-11 ENCOUNTER — Other Ambulatory Visit: Payer: Self-pay | Admitting: General Surgery

## 2016-08-11 ENCOUNTER — Encounter (HOSPITAL_BASED_OUTPATIENT_CLINIC_OR_DEPARTMENT_OTHER): Payer: Self-pay | Admitting: *Deleted

## 2016-08-11 DIAGNOSIS — C50211 Malignant neoplasm of upper-inner quadrant of right female breast: Secondary | ICD-10-CM

## 2016-08-13 ENCOUNTER — Ambulatory Visit
Admission: RE | Admit: 2016-08-13 | Discharge: 2016-08-13 | Disposition: A | Discharge status: Home / Self Care | Payer: PRIVATE HEALTH INSURANCE | Source: Ambulatory Visit | Attending: General Surgery | Admitting: General Surgery

## 2016-08-13 DIAGNOSIS — C50411 Malignant neoplasm of upper-outer quadrant of right female breast: Secondary | ICD-10-CM | POA: Insufficient documentation

## 2016-08-13 DIAGNOSIS — C50211 Malignant neoplasm of upper-inner quadrant of right female breast: Secondary | ICD-10-CM

## 2016-08-13 MED ORDER — GADOBENATE DIMEGLUMINE 529 MG/ML IV SOLN
15.0000 mL | Freq: Once | INTRAVENOUS | Status: AC | PRN
  Administered 2016-08-13: 15 mL via INTRAVENOUS

## 2016-08-13 NOTE — Assessment & Plan Note (Signed)
Rt Biopsy 11oclock and 2 o clock: Grade 3 IDC with LVI; LN biopsy: Positive IDC; 11oclock: Er 100%, PR 90%, Ki 67: 90%; Her 2 Neg Heterogeneous; LN: Er 100%, PR: 90%; Ki67: 60%; Her 2 Pos Ratio 2.37; 2oclock: Er 100%, PR 90%; Ki 67: 90%; Her 2 Positive 2.52  Rt Breast 2 masses at 11-12:30 Position spanning 8 cm; Additional hihly susp mass at 2 o clock: 1.5 cm; LN with cort thickening 2.7 cm  Pathology and radiology counseling: Discussed with the patient, the details of pathology including the type of breast cancer,the clinical staging, the significance of ER, PR and HER-2/neu receptors and the implications for treatment. After reviewing the pathology in detail, we proceeded to discuss the different treatment options between surgery, radiation, chemotherapy, antiestrogen therapies.  Recommendation based on multidisciplinary tumor board: 1. Neoadjuvant chemotherapy with TCH Perjeta 6 cycles followed by Herceptin maintenance for 1 year 2. Followed by breast conserving surgery if possible with sentinel lymph node study 3. Followed by adjuvant radiation therapy if patient had lumpectomy 4. Followed by Anti-estrogen therapy  Chemotherapy Counseling: I discussed the risks and benefits of chemotherapy including the risks of nausea/ vomiting, risk of infection from low WBC count, fatigue due to chemo or anemia, bruising or bleeding due to low platelets, mouth sores, loss/ change in taste and decreased appetite. Liver and kidney function will be monitored through out chemotherapy as abnormalities in liver and kidney function may be a side effect of treatment. Cardiac dysfunction due to Herceptin and Perjeta were discussed in detail. Risk of permanent bone marrow dysfunction due to chemo were also discussed.  Plan: 1. Port placement 2. Echocardiogram 3. Chemotherapy class 4. Breast MRI 5. CT chest abdomen pelvis and bone scan for staging  Return to clinic in 2 weeks to start chemotherapy.

## 2016-08-14 ENCOUNTER — Encounter (HOSPITAL_BASED_OUTPATIENT_CLINIC_OR_DEPARTMENT_OTHER)
Admission: RE | Admit: 2016-08-14 | Discharge: 2016-08-14 | Disposition: A | Discharge status: Home / Self Care | Payer: BLUE CROSS/BLUE SHIELD | Source: Ambulatory Visit | Attending: General Surgery | Admitting: General Surgery

## 2016-08-14 ENCOUNTER — Ambulatory Visit (HOSPITAL_BASED_OUTPATIENT_CLINIC_OR_DEPARTMENT_OTHER): Payer: BLUE CROSS/BLUE SHIELD | Admitting: Hematology and Oncology

## 2016-08-14 ENCOUNTER — Encounter: Payer: Self-pay | Admitting: Hematology and Oncology

## 2016-08-14 ENCOUNTER — Other Ambulatory Visit: Payer: Self-pay | Admitting: *Deleted

## 2016-08-14 VITALS — BP 146/82 | HR 70 | Temp 97.9°F | Resp 17 | Ht 67.0 in | Wt 182.5 lb

## 2016-08-14 DIAGNOSIS — E039 Hypothyroidism, unspecified: Secondary | ICD-10-CM | POA: Diagnosis not present

## 2016-08-14 DIAGNOSIS — C50411 Malignant neoplasm of upper-outer quadrant of right female breast: Secondary | ICD-10-CM

## 2016-08-14 DIAGNOSIS — F17200 Nicotine dependence, unspecified, uncomplicated: Secondary | ICD-10-CM | POA: Diagnosis not present

## 2016-08-14 DIAGNOSIS — Z7982 Long term (current) use of aspirin: Secondary | ICD-10-CM | POA: Diagnosis not present

## 2016-08-14 DIAGNOSIS — Z170 Estrogen receptor positive status [ER+]: Secondary | ICD-10-CM

## 2016-08-14 DIAGNOSIS — Z79899 Other long term (current) drug therapy: Secondary | ICD-10-CM | POA: Diagnosis not present

## 2016-08-14 DIAGNOSIS — Z803 Family history of malignant neoplasm of breast: Secondary | ICD-10-CM | POA: Diagnosis not present

## 2016-08-14 DIAGNOSIS — C50211 Malignant neoplasm of upper-inner quadrant of right female breast: Secondary | ICD-10-CM | POA: Diagnosis not present

## 2016-08-14 DIAGNOSIS — C50412 Malignant neoplasm of upper-outer quadrant of left female breast: Secondary | ICD-10-CM

## 2016-08-14 LAB — CBC WITH DIFFERENTIAL/PLATELET
Basophils Absolute: 0 10*3/uL (ref 0.0–0.1)
Basophils Relative: 0 %
EOS ABS: 0.3 10*3/uL (ref 0.0–0.7)
EOS PCT: 4 %
HCT: 43.2 % (ref 36.0–46.0)
Hemoglobin: 14.8 g/dL (ref 12.0–15.0)
LYMPHS ABS: 1.9 10*3/uL (ref 0.7–4.0)
LYMPHS PCT: 22 %
MCH: 32.2 pg (ref 26.0–34.0)
MCHC: 34.3 g/dL (ref 30.0–36.0)
MCV: 93.9 fL (ref 78.0–100.0)
MONO ABS: 0.5 10*3/uL (ref 0.1–1.0)
MONOS PCT: 5 %
Neutro Abs: 6 10*3/uL (ref 1.7–7.7)
Neutrophils Relative %: 69 %
PLATELETS: 201 10*3/uL (ref 150–400)
RBC: 4.6 MIL/uL (ref 3.87–5.11)
RDW: 13 % (ref 11.5–15.5)
WBC: 8.7 10*3/uL (ref 4.0–10.5)

## 2016-08-14 LAB — BASIC METABOLIC PANEL
Anion gap: 7 (ref 5–15)
BUN: 9 mg/dL (ref 6–20)
CHLORIDE: 107 mmol/L (ref 101–111)
CO2: 28 mmol/L (ref 22–32)
CREATININE: 0.71 mg/dL (ref 0.44–1.00)
Calcium: 9.3 mg/dL (ref 8.9–10.3)
GFR calc Af Amer: >60 mL/min (ref >60)
GFR calc non Af Amer: >60 mL/min (ref >60)
GLUCOSE: 94 mg/dL (ref 65–99)
Potassium: 4.6 mmol/L (ref 3.5–5.1)
Sodium: 142 mmol/L (ref 135–145)

## 2016-08-14 MED ORDER — LORAZEPAM 0.5 MG PO TABS: 0.5000 mg | ORAL_TABLET | Freq: Every day | ORAL | 0 refills | Status: DC

## 2016-08-14 MED ORDER — LIDOCAINE-PRILOCAINE 2.5-2.5 % EX CREA: TOPICAL_CREAM | CUTANEOUS | 3 refills | Status: DC

## 2016-08-14 MED ORDER — DEXAMETHASONE 4 MG PO TABS: 4.0000 mg | ORAL_TABLET | Freq: Every day | ORAL | 0 refills | Status: DC

## 2016-08-14 MED ORDER — ONDANSETRON HCL 8 MG PO TABS: 8.0000 mg | ORAL_TABLET | Freq: Two times a day (BID) | ORAL | 1 refills | Status: DC | PRN

## 2016-08-14 MED ORDER — PROCHLORPERAZINE MALEATE 10 MG PO TABS: 10.0000 mg | ORAL_TABLET | Freq: Four times a day (QID) | ORAL | 1 refills | Status: DC | PRN

## 2016-08-14 NOTE — Progress Notes (Signed)
START ON PATHWAY REGIMEN - Breast  BOS235: Docetaxel  + Carboplatin + Trastuzumab + Pertuzumab (TCHP) x 6 Cycles then Continue Trastuzumab Maintenance q21 Days Post-Surgery x 11 Doses to Complete One Year of Therapy  Docetaxel  + Carboplatin + Trastuzumab + Pertuzumab (TCHP) q21 Days:   A cycle is every 21 days:     Pertuzumab (Perjeta(R)) 840 mg flat dose IV in 250 mL NS over 60 minutes as a loading dose cycle 1 only.  See observation note below. Dose Mod: None     Pertuzumab (Perjeta(R)) 420 mg flat dose IV in 250 mL NS over 30 minutes as maintenance dose cycles 2 and beyond. An observation period of 30-60 minutes is recommended after each dose prior to subsequent infusion of trastuzumab or docetaxel. Dose Mod: None     Trastuzumab (Herceptin(R)) 8 mg/kg in 250 mL NS IV over 90 minutes as a loading dose cycle 1 only. Dose Mod: None     Trastuzumab (Herceptin(R)) 6 mg/kg in 250 mL NS IV over 30 minutes as maintenance dose cycle 2 and beyond. Dose Mod: None     Carboplatin (Paraplatin(R)) AUC=6 in a total of 250 mL NS IV over 1 hour. Cited: TRYPHAENA trial gave carboplatin prior to docetaxel. Dose Mod: None     Docetaxel (Taxotere(R)) 75 mg/m2 in 250 mL NS IV over 1 hour.  Docetaxel should be given after pertuzumab and trastuzumab Dose Mod: None Additional Orders: Premedicate with dexamethasone 8 mg PO bid for three days beginning 1 day prior to docetaxel therapy. Recommended monitoring: LVEF baseline and q3-4 months or with the development of cardiac symptoms and regimen changes for metastatic treatment. Schneeweiss, et al. Lelon Frohlich Oncol. 2013 Sep;24(9):2278-84  **Always confirm dose/schedule in your pharmacy ordering system**    Trastuzumab (Maintenance - NO Loading Dose):   A cycle is every 21 days:     Trastuzumab (Herceptin(R)) 6 mg/kg in 250 mL NS IV over 90 minutes.  If tolerated, maintenance dose may be given over 30 mins. Recommended Monitoring: LVEF q3-4 months for adjuvant  treatment, or with the development of cardiac symptoms and regimen  changes for metastatic treatment. Dose Mod: None  **Always confirm dose/schedule in your pharmacy ordering system**    Patient Characteristics: Neoadjuvant Chemotherapy, HER2/neu Positive, ER Positive AJCC Stage Grouping: IIIA Current Disease Status: No Distant Mets or Local Recurrence AJCC M Stage: 0 ER Status: Positive (+) AJCC N Stage: 1 AJCC T Stage: 3 HER2/neu: Positive (+) PR Status: Positive (+)  Intent of Therapy: Curative Intent, Discussed with Patient

## 2016-08-14 NOTE — Progress Notes (Signed)
[facility] CONSULT NOTE  Patient Care Team: [name], MD as PCP - General (Pediatrics)  CHIEF COMPLAINTS/PURPOSE OF CONSULTATION:  Newly diagnosed breast cancer  HISTORY OF PRESENTING ILLNESS:  Cynthia Chavez 61 y.o. female is here because of recent diagnosis of right breast cancer. Patient had a palpable abnormality in the right breast. Previously she has had other palpable abnormalities which were not significant. This time her symptoms lasted several months. She was seen by primary care physician who performed breast imaging. The mammogram revealed that an 8 cm span of highly suspicious mass extending from 11:00 to 2:00 position. She also had enlarged lymph nodes with thickened cortex. Biopsy of the 2 masses in the right breast along with the right axillary lymph node was performed. There was heterogeneous HER-2 positivity. 11:00 mass was ER/PR positive HER-2 negative with a Ki-67 of 90%. The lymph node as well as a 2:00 mass were both ER/PR posture and HER-2 positive. She was presented at the multidisciplinary tumor board and was seen by Dr. Donne Hazel who recommended neoadjuvant chemotherapy. She is getting a port placement this Thursday. She is here today accompanied by her son. She is very anxious and worried.   I reviewed her records extensively and collaborated the history with the patient.  SUMMARY OF ONCOLOGIC HISTORY:   Breast cancer of upper-outer quadrant of right female breast ([place])   07/24/2016 Mammogram    Rt Breast 2 masses at 11-12:30 Position spanning 8 cm; Additional hihly susp mass at 2 o clock: 1.5 cm; LN with cort thickening 2.7 cm      07/27/2016 Initial Diagnosis    Rt Biopsy 11oclock and 2 o clock: Grade 3 IDC with LVI; LN biopsy: Positive IDC; 11oclock: Er 100%, PR 90%, Ki 67: 90%; Her 2 Neg Heterogeneous; LN: Er 100%, PR: 90%; Ki67: 60%; Her 2 Pos Ratio 2.37; 2oclock: Er 100%, PR 90%; Ki 67: 90%; Her 2 Positive 2.52      MEDICAL HISTORY:  Past  Medical History:  Diagnosis Date  . Anxiety   . Edema    bilateral feet and leg swelling  . Hypothyroidism   . Malaise   . Thyroid disease   . Varicose veins     SURGICAL HISTORY: Past Surgical History:  Procedure Laterality Date  . ABDOMINAL HYSTERECTOMY    . CHOLECYSTECTOMY    . ESOPHAGEAL DILATION      SOCIAL HISTORY: Social History   Social History  . Marital status: Married    Spouse name: N/A  . Number of children: N/A  . Years of education: N/A   Occupational History  . Not on file.   Social History Main Topics  . Smoking status: Current Every Day Smoker    Packs/day: 0.50    Types: Cigarettes    Start date: 04/19/1993  . Smokeless tobacco: Never Used  . Alcohol use No  . Drug use: No  . Sexual activity: Not on file   Other Topics Concern  . Not on file   Social History Narrative  . No narrative on file    FAMILY HISTORY: Family History  Problem Relation Age of Onset  . COPD Father   . Cancer Father     ALLERGIES:  is allergic to ivp dye [iodinated diagnostic agents]; other; dilaudid [hydromorphone hcl]; asa [aspirin]; and codeine.  MEDICATIONS:  Current Outpatient Prescriptions  Medication Sig Dispense Refill  . aspirin EC 81 MG tablet Take 81 mg by mouth daily.    Marland Kitchen levothyroxine (  SYNTHROID, LEVOTHROID) 125 MCG tablet Take 1 tablet (125 mcg total) by mouth daily before breakfast. 90 tablet 1  . neomycin-polymyxin-hydrocortisone (CORTISPORIN) otic solution Place 4 drops into the left ear 4 (four) times daily. 10 mL 0   No current facility-administered medications for this visit.     REVIEW OF SYSTEMS:   Constitutional: Denies fevers, chills or abnormal night sweats Eyes: Denies blurriness of vision, double vision or watery eyes Ears, nose, mouth, throat, and face: Denies mucositis or sore throat Respiratory: Denies cough, dyspnea or wheezes Cardiovascular: Denies palpitation, chest discomfort or lower extremity swelling Gastrointestinal:   Denies nausea, heartburn or change in bowel habits Skin: Denies abnormal skin rashes Lymphatics: Denies new lymphadenopathy or easy bruising Neurological:Denies numbness, tingling or new weaknesses Behavioral/Psych: Mood is stable, no new changes  Breast: Palpable lump in the right breast All other systems were reviewed with the patient and are negative.  PHYSICAL EXAMINATION: ECOG PERFORMANCE STATUS: 1 - Symptomatic but completely ambulatory  Vitals:   08/14/16 1417  BP: (!) 146/82  Pulse: 70  Resp: 17  Temp: 97.9 F (36.6 C)   Filed Weights   08/14/16 1417  Weight: 182 lb 8 oz (82.8 kg)    GENERAL:alert, no distress and comfortable SKIN: skin color, texture, turgor are normal, no rashes or significant lesions EYES: normal, conjunctiva are pink and non-injected, sclera clear OROPHARYNX:no exudate, no erythema and lips, buccal mucosa, and tongue normal  NECK: supple, thyroid normal size, non-tender, without nodularity LYMPH:  no palpable lymphadenopathy in the cervical, axillary or inguinal LUNGS: clear to auscultation and percussion with normal breathing effort HEART: regular rate & rhythm and no murmurs and no lower extremity edema ABDOMEN:abdomen soft, non-tender and normal bowel sounds Musculoskeletal:no cyanosis of digits and no clubbing  PSYCH: alert & oriented x 3 with fluent speech NEURO: no focal motor/sensory deficits BREAST: Large palpable lump in the right breast. There is no evidence of inflammatory disease in the breast. (exam performed in the presence of a chaperone)   LABORATORY DATA:  I have reviewed the data as listed Lab Results  Component Value Date   WBC 9.7 01/08/2015   HGB 13.9 01/08/2015   HCT 44.6 01/08/2015   MCV 92.9 01/08/2015   PLT 194 01/25/2014   Lab Results  Component Value Date   NA 140 01/18/2016   K 4.2 01/18/2016   CL 102 01/18/2016   CO2 23 01/18/2016    RADIOGRAPHIC STUDIES: I have personally reviewed the radiological  reports and agreed with the findings in the report.  ASSESSMENT AND PLAN:  Breast cancer of upper-outer quadrant of right female breast ([facility]) Rt Biopsy 11oclock and 2 o clock: Grade 3 IDC with LVI; LN biopsy: Positive IDC; 11oclock: Er 100%, PR 90%, Ki 67: 90%; Her 2 Neg Heterogeneous; LN: Er 100%, PR: 90%; Ki67: 60%; Her 2 Pos Ratio 2.37; 2oclock: Er 100%, PR 90%; Ki 67: 90%; Her 2 Positive 2.52  Rt Breast 2 masses at 11-12:30 Position spanning 8 cm; Additional hihly susp mass at 2 o clock: 1.5 cm; LN with cort thickening 2.7 cm  Pathology and radiology counseling: Discussed with the patient, the details of pathology including the type of breast cancer,the clinical staging, the significance of ER, PR and HER-2/neu receptors and the implications for treatment. After reviewing the pathology in detail, we proceeded to discuss the different treatment options between surgery, radiation, chemotherapy, antiestrogen therapies. Breast MRI on 08/13/2016: 8 cm span of breast cancer with multiple enlarged axillary lymph nodes.  Recommendation based on multidisciplinary tumor board: 1. Neoadjuvant chemotherapy with TCH Perjeta 6 cycles followed by Herceptin maintenance for 1 year 2. Followed by breast conserving surgery if possible with sentinel lymph node study 3. Followed by adjuvant radiation therapy 4. Followed by Anti-estrogen therapy ------------------------------------------------------------------------------------------------------------------------------------------------------------------------------------------------------------------------------------------------------ Chemotherapy Counseling: I discussed the risks and benefits of chemotherapy including the risks of nausea/ vomiting, risk of infection from low WBC count, fatigue due to chemo or anemia, bruising or bleeding due to low platelets, mouth sores, loss/ change in taste and decreased appetite. Liver and kidney function will be monitored  through out chemotherapy as abnormalities in liver and kidney function may be a side effect of treatment. Cardiac dysfunction due to Herceptin and Perjeta were discussed in detail. Risk of permanent bone marrow dysfunction due to chemo were also discussed.  Plan: 1. Port placement to happen on Thursday 2. Echocardiogram 3. Chemotherapy class 4. CT chest abdomen pelvis and bone scan for staging  The patient does not have metastatic disease, then treatment would be with [place]. If the patient does have metastatic disease and we will treat her with Taxol Herceptin and Perjeta.  Return to clinic on this Friday to start chemotherapy. I will see the patient one week after starting chemotherapy.  All questions were answered. The patient knows to call the clinic with any problems, questions or concerns.    Rulon Eisenmenger, MD 08/14/16

## 2016-08-15 ENCOUNTER — Telehealth: Payer: Self-pay | Admitting: Hematology and Oncology

## 2016-08-15 LAB — CANCER ANTIGEN 27.29: CA 27.29: 62.7 U/mL — AB (ref 0.0–38.6)

## 2016-08-15 NOTE — Telephone Encounter (Signed)
Spoke with patient re 1/12 appointments. Also confirmed 1/10 appointments and patient will get schedule when she comes in on 1/10.  Echo and chemo ed already on schedule.

## 2016-08-16 ENCOUNTER — Encounter: Payer: Self-pay | Admitting: Hematology and Oncology

## 2016-08-16 ENCOUNTER — Encounter: Payer: Self-pay | Admitting: *Deleted

## 2016-08-16 ENCOUNTER — Other Ambulatory Visit: Payer: BLUE CROSS/BLUE SHIELD

## 2016-08-16 ENCOUNTER — Ambulatory Visit (HOSPITAL_BASED_OUTPATIENT_CLINIC_OR_DEPARTMENT_OTHER)
Admission: RE | Admit: 2016-08-16 | Discharge: 2016-08-16 | Disposition: A | Discharge status: Home / Self Care | Payer: BLUE CROSS/BLUE SHIELD | Source: Ambulatory Visit | Attending: Hematology and Oncology | Admitting: Hematology and Oncology

## 2016-08-16 DIAGNOSIS — Z170 Estrogen receptor positive status [ER+]: Secondary | ICD-10-CM

## 2016-08-16 DIAGNOSIS — C50411 Malignant neoplasm of upper-outer quadrant of right female breast: Secondary | ICD-10-CM

## 2016-08-16 DIAGNOSIS — C50211 Malignant neoplasm of upper-inner quadrant of right female breast: Secondary | ICD-10-CM | POA: Diagnosis not present

## 2016-08-16 NOTE — Progress Notes (Signed)
Faxed authorization form to [employer] for Neulasta. Fax received ok per confirmation sheet.  Emailed copy of approval letter for both Herceptin and Perjeta to billing contacts. Placed copies to be scanned by HIM.  Added assistance to log sheet.

## 2016-08-16 NOTE — Progress Notes (Signed)
Met with patient to introduce myself as her Estate manager/land agent and to  discuss financial options for her treatment. With patient's permission, enrolled her in the [place] program for Neulasta. Patient approved for $10,000 leaving her only with a $25 copay per injection after first one covered at 100%. Gave patient a copy of  Approval. Advised patient that I will keep up with her billing for Neulasta and send to [employer] and run payment from her card when needed. There is nothing she needs to do other than advising me if she is billed for more than $25.   Enrolled patient into [place] for Herceptin and Perjeta. Patient approved for $25,000 for Herceptin leaving her with only a $25 copay 08/16/16-08/15/17. Patient approved for $25,000 for Perjeta leaving her only with a $25 copay 08/16/16-08/15/17. Gave patient a copy of card and approval letters and advised there is nothing she needs to do on her end.   Gave patient Duanne Limerick application which assists patients that live in [facility]. Advised patient she may bring the application and supporting documents back to me to make copies and submit.  Discussed with patient copay assistance through [employer]. Advised patient that proof of household gross income would be needed to apply or she could provide them with the information over the phone. Gave her contact number for them if needed, or we can apply once she gathers the needed information.  Also discussed [employer] and what it covers. Advised patient I would also need her household income to apply being that her spouse is working and she doesn't know his income right off. Patient verbalized understanding. I wrote down this information in case she needed to discuss with her spouse. Patient has my card for any additional financial questions or concerns. Patient may also see me on Friday if needed.

## 2016-08-16 NOTE — Progress Notes (Signed)
  Echocardiogram 2D Echocardiogram has been performed.  Tresa Res 08/16/2016, 9:29 AM

## 2016-08-17 ENCOUNTER — Ambulatory Visit (HOSPITAL_COMMUNITY): Payer: BLUE CROSS/BLUE SHIELD

## 2016-08-17 ENCOUNTER — Encounter (HOSPITAL_COMMUNITY)
Admission: RE | Admit: 2016-08-17 | Discharge: 2016-08-17 | Disposition: A | Discharge status: Home / Self Care | Payer: BLUE CROSS/BLUE SHIELD | Source: Ambulatory Visit | Attending: Hematology and Oncology | Admitting: Hematology and Oncology

## 2016-08-17 ENCOUNTER — Encounter (HOSPITAL_BASED_OUTPATIENT_CLINIC_OR_DEPARTMENT_OTHER): Payer: Self-pay

## 2016-08-17 ENCOUNTER — Encounter (HOSPITAL_BASED_OUTPATIENT_CLINIC_OR_DEPARTMENT_OTHER)
Admission: RE | Disposition: A | Discharge status: Home / Self Care | Payer: Self-pay | Source: Ambulatory Visit | Attending: General Surgery

## 2016-08-17 ENCOUNTER — Ambulatory Visit (HOSPITAL_COMMUNITY)
Admission: RE | Admit: 2016-08-17 | Discharge: 2016-08-17 | Disposition: A | Discharge status: Home / Self Care | Payer: BLUE CROSS/BLUE SHIELD | Source: Ambulatory Visit | Attending: Hematology and Oncology | Admitting: Hematology and Oncology

## 2016-08-17 ENCOUNTER — Ambulatory Visit (HOSPITAL_BASED_OUTPATIENT_CLINIC_OR_DEPARTMENT_OTHER): Payer: BLUE CROSS/BLUE SHIELD | Admitting: Anesthesiology

## 2016-08-17 ENCOUNTER — Ambulatory Visit (HOSPITAL_BASED_OUTPATIENT_CLINIC_OR_DEPARTMENT_OTHER)
Admission: RE | Admit: 2016-08-17 | Discharge: 2016-08-17 | Disposition: A | Discharge status: Home / Self Care | Payer: BLUE CROSS/BLUE SHIELD | Source: Ambulatory Visit | Attending: General Surgery | Admitting: General Surgery

## 2016-08-17 DIAGNOSIS — Z419 Encounter for procedure for purposes other than remedying health state, unspecified: Secondary | ICD-10-CM

## 2016-08-17 DIAGNOSIS — Z170 Estrogen receptor positive status [ER+]: Principal | ICD-10-CM

## 2016-08-17 DIAGNOSIS — C50211 Malignant neoplasm of upper-inner quadrant of right female breast: Secondary | ICD-10-CM | POA: Insufficient documentation

## 2016-08-17 DIAGNOSIS — F17200 Nicotine dependence, unspecified, uncomplicated: Secondary | ICD-10-CM | POA: Insufficient documentation

## 2016-08-17 DIAGNOSIS — E039 Hypothyroidism, unspecified: Secondary | ICD-10-CM | POA: Insufficient documentation

## 2016-08-17 DIAGNOSIS — Z803 Family history of malignant neoplasm of breast: Secondary | ICD-10-CM | POA: Insufficient documentation

## 2016-08-17 DIAGNOSIS — C50412 Malignant neoplasm of upper-outer quadrant of left female breast: Secondary | ICD-10-CM

## 2016-08-17 DIAGNOSIS — Z79899 Other long term (current) drug therapy: Secondary | ICD-10-CM | POA: Insufficient documentation

## 2016-08-17 DIAGNOSIS — Z95828 Presence of other vascular implants and grafts: Secondary | ICD-10-CM

## 2016-08-17 DIAGNOSIS — C50411 Malignant neoplasm of upper-outer quadrant of right female breast: Secondary | ICD-10-CM

## 2016-08-17 DIAGNOSIS — Z7982 Long term (current) use of aspirin: Secondary | ICD-10-CM | POA: Insufficient documentation

## 2016-08-17 HISTORY — PX: PORTACATH PLACEMENT: SHX2246

## 2016-08-17 HISTORY — DX: Hypothyroidism, unspecified: E03.9

## 2016-08-17 HISTORY — DX: Anxiety disorder, unspecified: F41.9

## 2016-08-17 SURGERY — INSERTION, TUNNELED CENTRAL VENOUS DEVICE, WITH PORT
Anesthesia: General | Site: Chest | Laterality: Left

## 2016-08-17 MED ORDER — MIDAZOLAM HCL 2 MG/2ML IJ SOLN
INTRAMUSCULAR | Status: AC
  Filled 2016-08-17: qty 2

## 2016-08-17 MED ORDER — ACETAMINOPHEN 500 MG PO TABS
1000.0000 mg | ORAL_TABLET | ORAL | Status: AC
  Administered 2016-08-17: 1000 mg via ORAL

## 2016-08-17 MED ORDER — GABAPENTIN 300 MG PO CAPS
300.0000 mg | ORAL_CAPSULE | ORAL | Status: AC
  Administered 2016-08-17: 300 mg via ORAL

## 2016-08-17 MED ORDER — FENTANYL CITRATE (PF) 100 MCG/2ML IJ SOLN: 25.0000 ug | INTRAMUSCULAR | Status: DC | PRN

## 2016-08-17 MED ORDER — HEPARIN (PORCINE) IN NACL 2-0.9 UNIT/ML-% IJ SOLN
INTRAMUSCULAR | Status: AC
  Filled 2016-08-17: qty 500

## 2016-08-17 MED ORDER — LACTATED RINGERS IV SOLN
INTRAVENOUS | Status: DC
Start: 2016-08-17 — End: 2016-08-17
  Administered 2016-08-17 (×2): via INTRAVENOUS

## 2016-08-17 MED ORDER — ONDANSETRON HCL 4 MG/2ML IJ SOLN
INTRAMUSCULAR | Status: AC
  Filled 2016-08-17: qty 2

## 2016-08-17 MED ORDER — SCOPOLAMINE 1 MG/3DAYS TD PT72: 1.0000 | MEDICATED_PATCH | Freq: Once | TRANSDERMAL | Status: DC | PRN

## 2016-08-17 MED ORDER — PROPOFOL 500 MG/50ML IV EMUL
INTRAVENOUS | Status: AC
  Filled 2016-08-17: qty 50

## 2016-08-17 MED ORDER — HEPARIN (PORCINE) IN NACL 2-0.9 UNIT/ML-% IJ SOLN
INTRAMUSCULAR | Status: DC | PRN
  Administered 2016-08-17: 1 via INTRAVENOUS

## 2016-08-17 MED ORDER — ACETAMINOPHEN 500 MG PO TABS
ORAL_TABLET | ORAL | Status: AC
  Filled 2016-08-17: qty 2

## 2016-08-17 MED ORDER — CEFAZOLIN SODIUM-DEXTROSE 2-4 GM/100ML-% IV SOLN
2.0000 g | INTRAVENOUS | Status: AC
  Administered 2016-08-17: 2 g via INTRAVENOUS

## 2016-08-17 MED ORDER — HEPARIN SOD (PORK) LOCK FLUSH 100 UNIT/ML IV SOLN
INTRAVENOUS | Status: DC | PRN
  Administered 2016-08-17: 500 [IU] via INTRAVENOUS

## 2016-08-17 MED ORDER — DEXAMETHASONE SODIUM PHOSPHATE 10 MG/ML IJ SOLN
INTRAMUSCULAR | Status: AC
  Filled 2016-08-17: qty 1

## 2016-08-17 MED ORDER — PROPOFOL 10 MG/ML IV BOLUS
INTRAVENOUS | Status: DC | PRN
  Administered 2016-08-17: 50 mg via INTRAVENOUS
  Administered 2016-08-17: 150 mg via INTRAVENOUS

## 2016-08-17 MED ORDER — CEFAZOLIN SODIUM-DEXTROSE 2-4 GM/100ML-% IV SOLN
INTRAVENOUS | Status: AC
  Filled 2016-08-17: qty 100

## 2016-08-17 MED ORDER — GABAPENTIN 300 MG PO CAPS
ORAL_CAPSULE | ORAL | Status: AC
  Filled 2016-08-17: qty 1

## 2016-08-17 MED ORDER — TECHNETIUM TC 99M MEDRONATE IV KIT
25.0000 | PACK | Freq: Once | INTRAVENOUS | Status: AC | PRN
  Administered 2016-08-17: 25 via INTRAVENOUS

## 2016-08-17 MED ORDER — FENTANYL CITRATE (PF) 100 MCG/2ML IJ SOLN: 50.0000 ug | INTRAMUSCULAR | Status: DC | PRN

## 2016-08-17 MED ORDER — HEPARIN SOD (PORK) LOCK FLUSH 100 UNIT/ML IV SOLN
INTRAVENOUS | Status: AC
  Filled 2016-08-17: qty 5

## 2016-08-17 MED ORDER — ONDANSETRON HCL 4 MG/2ML IJ SOLN: 4.0000 mg | Freq: Once | INTRAMUSCULAR | Status: DC | PRN

## 2016-08-17 MED ORDER — BUPIVACAINE HCL (PF) 0.25 % IJ SOLN
INTRAMUSCULAR | Status: AC
  Filled 2016-08-17: qty 30

## 2016-08-17 MED ORDER — MIDAZOLAM HCL 2 MG/2ML IJ SOLN
1.0000 mg | INTRAMUSCULAR | Status: DC | PRN
Start: 2016-08-17 — End: 2016-08-17

## 2016-08-17 MED ORDER — BUPIVACAINE HCL (PF) 0.25 % IJ SOLN
INTRAMUSCULAR | Status: DC | PRN
Start: 2016-08-17 — End: 2016-08-17
  Administered 2016-08-17: 6 mL/h

## 2016-08-17 MED ORDER — LIDOCAINE HCL (CARDIAC) 20 MG/ML IV SOLN
INTRAVENOUS | Status: DC | PRN
  Administered 2016-08-17: 30 mg via INTRAVENOUS
  Administered 2016-08-17: 80 mg via INTRAVENOUS

## 2016-08-17 MED ORDER — LIDOCAINE 2% (20 MG/ML) 5 ML SYRINGE
INTRAMUSCULAR | Status: AC
  Filled 2016-08-17: qty 5

## 2016-08-17 MED ORDER — DEXAMETHASONE SODIUM PHOSPHATE 4 MG/ML IJ SOLN
INTRAMUSCULAR | Status: DC | PRN
  Administered 2016-08-17: 10 mg via INTRAVENOUS

## 2016-08-17 MED ORDER — FENTANYL CITRATE (PF) 100 MCG/2ML IJ SOLN
INTRAMUSCULAR | Status: AC
  Filled 2016-08-17: qty 2

## 2016-08-17 SURGICAL SUPPLY — 56 items
ADH SKN CLS APL DERMABOND .7 (GAUZE/BANDAGES/DRESSINGS) ×1
APL SKNCLS STERI-STRIP NONHPOA (GAUZE/BANDAGES/DRESSINGS)
BAG DECANTER FOR FLEXI CONT (MISCELLANEOUS) ×3 IMPLANT
BENZOIN TINCTURE PRP APPL 2/3 (GAUZE/BANDAGES/DRESSINGS) ×1 IMPLANT
BLADE SURG 11 STRL SS (BLADE) ×3 IMPLANT
BLADE SURG 15 STRL LF DISP TIS (BLADE) ×1 IMPLANT
BLADE SURG 15 STRL SS (BLADE) ×3
CANISTER SUCT 1200ML W/VALVE (MISCELLANEOUS) IMPLANT
CHLORAPREP W/TINT 26ML (MISCELLANEOUS) ×3 IMPLANT
CLOSURE WOUND 1/2 X4 (GAUZE/BANDAGES/DRESSINGS)
COVER BACK TABLE 60X90IN (DRAPES) ×3 IMPLANT
COVER MAYO STAND STRL (DRAPES) ×3 IMPLANT
COVER PROBE 5X48 (MISCELLANEOUS) ×3
DECANTER SPIKE VIAL GLASS SM (MISCELLANEOUS) IMPLANT
DERMABOND ADVANCED (GAUZE/BANDAGES/DRESSINGS) ×2
DERMABOND ADVANCED .7 DNX12 (GAUZE/BANDAGES/DRESSINGS) ×1 IMPLANT
DRAPE C-ARM 42X72 X-RAY (DRAPES) ×3 IMPLANT
DRAPE LAPAROSCOPIC ABDOMINAL (DRAPES) ×3 IMPLANT
DRAPE UTILITY XL STRL (DRAPES) ×3 IMPLANT
DRSG TEGADERM 4X4.75 (GAUZE/BANDAGES/DRESSINGS) ×2 IMPLANT
ELECT COATED BLADE 2.86 ST (ELECTRODE) ×3 IMPLANT
ELECT REM PT RETURN 9FT ADLT (ELECTROSURGICAL) ×3
ELECTRODE REM PT RTRN 9FT ADLT (ELECTROSURGICAL) ×1 IMPLANT
GLOVE BIO SURGEON STRL SZ7 (GLOVE) ×3 IMPLANT
GLOVE BIOGEL PI IND STRL 7.0 (GLOVE) IMPLANT
GLOVE BIOGEL PI IND STRL 7.5 (GLOVE) ×1 IMPLANT
GLOVE BIOGEL PI INDICATOR 7.0 (GLOVE) ×8
GLOVE BIOGEL PI INDICATOR 7.5 (GLOVE) ×2
GLOVE SURG SS PI 7.0 STRL IVOR (GLOVE) ×2 IMPLANT
GOWN STRL REUS W/ TWL LRG LVL3 (GOWN DISPOSABLE) ×2 IMPLANT
GOWN STRL REUS W/TWL LRG LVL3 (GOWN DISPOSABLE) ×9
IV KIT MINILOC 20X1 SAFETY (NEEDLE) IMPLANT
KIT CVR 48X5XPRB PLUP LF (MISCELLANEOUS) IMPLANT
KIT PORT POWER 8FR ISP CVUE (Catheter) ×2 IMPLANT
NDL HYPO 25X1 1.5 SAFETY (NEEDLE) ×1 IMPLANT
NDL SAFETY ECLIPSE 18X1.5 (NEEDLE) IMPLANT
NEEDLE HYPO 18GX1.5 SHARP (NEEDLE)
NEEDLE HYPO 25X1 1.5 SAFETY (NEEDLE) ×3 IMPLANT
PACK BASIN DAY SURGERY FS (CUSTOM PROCEDURE TRAY) ×3 IMPLANT
PENCIL BUTTON HOLSTER BLD 10FT (ELECTRODE) ×3 IMPLANT
SLEEVE SCD COMPRESS KNEE MED (MISCELLANEOUS) ×3 IMPLANT
SPONGE GAUZE 4X4 12PLY STER LF (GAUZE/BANDAGES/DRESSINGS) ×5 IMPLANT
STRIP CLOSURE SKIN 1/2X4 (GAUZE/BANDAGES/DRESSINGS) ×1 IMPLANT
SUT MON AB 4-0 PC3 18 (SUTURE) ×3 IMPLANT
SUT PROLENE 2 0 SH DA (SUTURE) ×3 IMPLANT
SUT SILK 2 0 TIES 17X18 (SUTURE)
SUT SILK 2-0 18XBRD TIE BLK (SUTURE) IMPLANT
SUT VIC AB 3-0 SH 27 (SUTURE) ×3
SUT VIC AB 3-0 SH 27X BRD (SUTURE) ×1 IMPLANT
SYR 5ML LUER SLIP (SYRINGE) ×3 IMPLANT
SYR CONTROL 10ML LL (SYRINGE) ×3 IMPLANT
TOWEL OR 17X24 6PK STRL BLUE (TOWEL DISPOSABLE) ×3 IMPLANT
TOWEL OR NON WOVEN STRL DISP B (DISPOSABLE) ×3 IMPLANT
TUBE CONNECTING 20'X1/4 (TUBING)
TUBE CONNECTING 20X1/4 (TUBING) IMPLANT
YANKAUER SUCT BULB TIP NO VENT (SUCTIONS) IMPLANT

## 2016-08-17 NOTE — Transfer of Care (Signed)
Immediate Anesthesia Transfer of Care Note  Patient: Cynthia Chavez  Procedure(s) Performed: Procedure(s): INSERTION PORT-A-CATH WITH [place] (Left)  Patient Location: PACU  Anesthesia Type:General  Level of Consciousness: awake  Airway & Oxygen Therapy: Patient Spontanous Breathing and Patient connected to face mask oxygen  Post-op Assessment: Report given to RN and Post -op Vital signs reviewed and stable  Post vital signs: Reviewed and stable  Last Vitals:  Vitals:   08/17/16 0655  BP: 119/82  Pulse: 73  Resp: 20  Temp: 36.9 C    Last Pain:  Vitals:   08/17/16 0655  TempSrc: Oral         Complications: No apparent anesthesia complications

## 2016-08-17 NOTE — Op Note (Signed)
Preoperative diagnosis: breast cancer need for venous access Postoperative diagnosis: same as above Procedure: right ij US guided powerport insertion Surgeon: Dr Serita Grammes EBL: minimal Anes: general  Specimens none Complications none Drains none Sponge count correct Dispo to pacu stable  Indications: This is a 53 yof with newly diagnosed right breast cancer. She is due to begin primary systemic chemotherapy. We discussed port placement.   Procedure: After informed consent was obtained the patient was taken to the operating room. She was given antibiotics. Sequential compression devices were on her legs. She was then placed under general anesthesia with an LMA. Then she was prepped and draped in the standard sterile surgical fashion. Surgical timeout was then performed.  I used the ultrasound to identify the right internal jugular vein. I then accessed the vein using the ultrasound. This aspirated blood. I then placed the wire.  This was confirmed by fluoroscopy and ultrasound to be in the correct position. I created a pocket on the right chest.. I tunneled the line between the 2 sites. I then dilated the tract and placed the dilator assembly with the sheath. This was done under fluoroscopy. I then removed the sheath and dilator. The wire was also removed. The line was then pulled back to be in the venacava. I hooked this up to the port. I sutured this into place with 2-0 Prolene in 2 places. This aspirated blood and flushed easily.This was confirmed with a final fluoroscopy. I placed the access needle and flushed this as well. I left this accessed. I then closed this with 3-0 Vicryl and 4-0 Monocryl. Dermabond was placed on both the incisions.A dressing was placed. She tolerated this well and was transferred to the recovery room in stable condition.

## 2016-08-17 NOTE — Discharge Instructions (Signed)

## 2016-08-17 NOTE — Interval H&P Note (Signed)
History and Physical Interval Note:  08/17/2016 7:11 AM  Cynthia Chavez  has presented today for surgery, with the diagnosis of BREAST CANCER  The various methods of treatment have been discussed with the patient and family. After consideration of risks, benefits and other options for treatment, the patient has consented to  Procedure(s): INSERTION PORT-A-CATH WITH [place] (N/A) as a surgical intervention .  The patient's history has been reviewed, patient examined, no change in status, stable for surgery.  I have reviewed the patient's chart and labs.  Questions were answered to the patient's satisfaction.     [name]

## 2016-08-17 NOTE — H&P (Signed)
61 yof referred by Dr Assunta Found for newly diagnosed right breast cancer. she noted a palpable right breast mass several weeks ago that has remained same she has no discharge. she has no personal history of breast cancer. She has a niece at age 63 with breast cancer but no other family history. she underwent mm that showed b density breasts. there was upper inner right breast mass measuring 1.3 cm. There was additional partially obscured mass measuring about 3.6 cm. left breast negative US showed a 1.5x1x1 mass at 2 oclock 6 cm from nac. this corresponds to uiq mass on mm. there is also at 1230 5 cm from nacc a mass measuring 3.6x2.4x2.3 cm and there is another mass at 11 oclock measuring 4.6x1.9x2.7 cm. the entire area is about 8 cm in size. she also has a single node with cortical thickening measuring 2.7 cm in size. biopsy of 11 oclock mass is IDC with LVI that is er pos/pr pos,/her2 negative/Ki 90, biopsy of node is positive and is er pos/pr pos/her 2 pos/Ki 60%, the 2 oclock mass is IDC wih LVI that is er pos/pr pos/her2 pos/Ki is 90%.   Past Surgical History Nance Pear, [place]; 08/11/2016 10:38 AM) Breast Biopsy  Right. Gallbladder Surgery - Open  Hysterectomy (not due to cancer) - Partial   Diagnostic Studies History Nance Pear, [place]; 08/11/2016 10:38 AM) Colonoscopy  5-10 years ago Mammogram  within last year Pap Smear  >5 years ago  Allergies Nance Pear, CMA; 08/11/2016 10:42 AM) Iodinated Glycerol *COUGH/COLD/ALLERGY*  rash Dilacor XR *CALCIUM CHANNEL BLOCKERS*  shortness of breath, swelling Aspir-81 *ANALGESICS - NonNarcotic*  shortness of breath Codeine Phosphate *ANALGESICS - OPIOID*  nausea vomiting Allergies Reconciled   Medication History Nance Pear, CMA; 08/11/2016 10:42 AM) Levothyroxine Sodium (100MCG For Solution, Intravenous daily) Active. Medications Reconciled  Social History Nance Pear, [place]; 08/11/2016 10:38 AM) Caffeine use   Carbonated beverages, Coffee. No alcohol use  No drug use  Tobacco use  Current every day smoker.  Family History Nance Pear, [place]; 08/11/2016 10:38 AM) Alcohol Abuse  Brother. Arthritis  Mother. Cancer  Sister. Hypertension  Brother, Sister, Son. Thyroid problems  Mother, Sister.  Pregnancy / Birth History Nance Pear, [place]; 08/11/2016 10:38 AM) Age at menarche  46 years. Gravida  3 Maternal age  65-20 Para  3  Other Problems Nance Pear, [place]; 08/11/2016 10:38 AM) Breast Cancer  Gastroesophageal Reflux Disease  Hemorrhoids  Lump In Breast  Thyroid Disease   Review of Systems Nance Pear CMA; 08/11/2016 10:38 AM) General Not Present- Appetite Loss, Chills, Fatigue, Fever, Night Sweats, Weight Gain and Weight Loss. Skin Not Present- Change in Wart/Mole, Dryness, Hives, Jaundice, New Lesions, Non-Healing Wounds, Rash and Ulcer. HEENT Not Present- Earache, Hearing Loss, Hoarseness, Nose Bleed, Oral Ulcers, Ringing in the Ears, Seasonal Allergies, Sinus Pain, Sore Throat, Visual Disturbances, Wears glasses/contact lenses and Yellow Eyes. Respiratory Not Present- Bloody sputum, Chronic Cough, Difficulty Breathing, Snoring and Wheezing. Breast Present- Breast Mass and Breast Pain. Not Present- Nipple Discharge and Skin Changes. Cardiovascular Not Present- Chest Pain, Difficulty Breathing Lying Down, Leg Cramps, Palpitations, Rapid Heart Rate, Shortness of Breath and Swelling of Extremities. Gastrointestinal Not Present- Abdominal Pain, Bloating, Bloody Stool, Change in Bowel Habits, Chronic diarrhea, Constipation, Difficulty Swallowing, Excessive gas, Gets full quickly at meals, Hemorrhoids, Indigestion, Nausea, Rectal Pain and Vomiting. Female Genitourinary Not Present- Frequency, Nocturia, Painful Urination, Pelvic Pain and Urgency. Neurological Not Present- Decreased Memory, Fainting, Headaches, Numbness, Seizures, Tingling, Tremor, Trouble walking and  Weakness. Psychiatric Not Present- Anxiety, Bipolar, Change in Sleep Pattern, Depression, Fearful and Frequent crying. Endocrine Not Present- Cold Intolerance, Excessive Hunger, Hair Changes, Heat Intolerance, Hot flashes and New Diabetes. Hematology Not Present- Blood Thinners, Easy Bruising, Excessive bleeding, Gland problems, HIV and Persistent Infections.  Vitals Bary Castilla Bradford CMA; 08/11/2016 10:43 AM) 08/11/2016 10:42 AM Weight: 183 lb Height: 67in Body Surface Area: 1.95 m Body Mass Index: 28.66 kg/m  Temp.: 97.75F  Pulse: 65 (Regular)  BP: 122/88 (Sitting, Left Arm, Standard)  Physical Exam Rolm Bookbinder MD; 08/13/2016 1:56 PM) General Mental Status-Alert. Orientation-Oriented X3.  Chest and Lung Exam Chest and lung exam reveals -on auscultation, normal breath sounds, no adventitious sounds and normal vocal resonance.  Breast Nipples-No Discharge. Note: right upper breast measuring over 5 cm,   Cardiovascular Cardiovascular examination reveals -normal heart sounds, regular rate and rhythm with no murmurs.  Lymphatic Head & Neck  General Head & Neck Lymphatics: Bilateral - Description - Normal. Axillary -Note: enlarged right axillary node.  Note: no [place] Level adenopathy   Assessment & Plan Rolm Bookbinder MD; 08/13/2016 2:00 PM) BREAST CANCER OF UPPER-INNER QUADRANT OF RIGHT FEMALE BREAST (C50.211) Story: MRI bilateral breast, onoclogy appt, primary systemic therapy followed by surgery. We discussed the staging and pathophysiology of breast cancer. We discussed all of the different options for treatment for breast cancer including surgery, chemotherapy, radiation therapy, Herceptin, and antiestrogen therapy. I think right now she needs mastectomy (mrm would certainly be option). I think even with primary systemic therapy which is indicated given her 2 positivity she would still likely require mastectomy as I think she will have some sort of  partial response. It certainly might make mastectomy easier and likely to get a clear margin. Nodal options include alnd vs tad at this point. I think if this is able to be downstaged we might be able to do less nodal surgery at that time as well. I think there is no downside to primary systemic therapy and may certainly be a number of options. I discussed with her port placement as discussed at conference as well. I will order mri as pre chemo evaluation. we will do anti her2/chemo followed by mri at completion and then proceed with surgery about 3 weeks after surgery. She is agreeable to this plan I will await mri and oncology appt. will schedule port placement asap

## 2016-08-17 NOTE — Anesthesia Postprocedure Evaluation (Signed)
Anesthesia Post Note  Patient: Cynthia Chavez  Procedure(s) Performed: Procedure(s) (LRB): INSERTION PORT-A-CATH WITH [place] (Left)  Patient location during evaluation: PACU Anesthesia Type: General Level of consciousness: awake and alert Pain management: pain level controlled Vital Signs Assessment: post-procedure vital signs reviewed and stable Respiratory status: spontaneous breathing, nonlabored ventilation, respiratory function stable and patient connected to nasal cannula oxygen Cardiovascular status: blood pressure returned to baseline and stable Postop Assessment: no signs of nausea or vomiting Anesthetic complications: no       Last Vitals:  Vitals:   08/17/16 0845 08/17/16 0915  BP: (!) 141/79 (!) 145/66  Pulse: 67 69  Resp:  18  Temp:  36.6 C    Last Pain:  Vitals:   08/17/16 0915  TempSrc:   PainSc: [place]

## 2016-08-17 NOTE — Anesthesia Preprocedure Evaluation (Addendum)
Anesthesia Evaluation  Patient identified by MRN, date of birth, ID band Patient awake    Reviewed: Allergy & Precautions, NPO status , Patient's Chart, lab work & pertinent test results  Airway Mallampati: II  TM Distance: >3 FB Neck ROM: Full    Dental  (+) Dental Advisory Given, Upper Dentures   Pulmonary Current Smoker,    Pulmonary exam normal breath sounds clear to auscultation       Cardiovascular + Peripheral Vascular Disease  Normal cardiovascular exam Rhythm:Regular Rate:Normal  ECHO 08/16/16: Study Conclusions  - Left ventricle: The cavity size was normal. Wall thickness was normal. Systolic function was normal. The estimated ejection fraction was in the range of 60% to 65%. Wall motion was normal; there were no regional wall motion abnormalities. Features are consistent with a pseudonormal left ventricular filling pattern, with concomitant abnormal relaxation and increased filling   pressure (grade 2 diastolic dysfunction).   Neuro/Psych PSYCHIATRIC DISORDERS Anxiety negative neurological ROS     GI/Hepatic negative GI ROS, Neg liver ROS,   Endo/Other  Hypothyroidism   Renal/GU negative Renal ROS     Musculoskeletal negative musculoskeletal ROS (+)   Abdominal   Peds  Hematology negative hematology ROS (+)   Anesthesia Other Findings Day of surgery medications reviewed with the patient.  Breast cancer  Reproductive/Obstetrics                            Anesthesia Physical Anesthesia Plan  ASA: II  Anesthesia Plan: General   Post-op Pain Management:    Induction: Intravenous  Airway Management Planned: LMA  Additional Equipment:   Intra-op Plan:   Post-operative Plan: Extubation in OR  Informed Consent: I have reviewed the patients History and Physical, chart, labs and discussed the procedure including the risks, benefits and alternatives for the proposed  anesthesia with the patient or authorized representative who has indicated his/her understanding and acceptance.   Dental advisory given  Plan Discussed with: CRNA  Anesthesia Plan Comments: (Risks/benefits of general anesthesia discussed with patient including risk of damage to teeth, lips, gum, and tongue, nausea/vomiting, allergic reactions to medications, and the possibility of heart attack, stroke and death.  All patient questions answered.  Patient wishes to proceed.)        Anesthesia Quick Evaluation

## 2016-08-17 NOTE — Anesthesia Procedure Notes (Signed)
Procedure Name: LMA Insertion Date/Time: 08/17/2016 7:33 AM Performed by: Toula Moos L Pre-anesthesia Checklist: Patient identified, Emergency Drugs available, Suction available, Patient being monitored and Timeout performed Patient Re-evaluated:Patient Re-evaluated prior to inductionOxygen Delivery Method: Circle system utilized Preoxygenation: Pre-oxygenation with 100% oxygen Intubation Type: IV induction Ventilation: Mask ventilation without difficulty LMA: LMA inserted LMA Size: 4.0 Number of attempts: 1 Airway Equipment and Method: Bite block Placement Confirmation: positive ETCO2 Tube secured with: Tape Dental Injury: Teeth and Oropharynx as per pre-operative assessment

## 2016-08-18 ENCOUNTER — Other Ambulatory Visit (HOSPITAL_BASED_OUTPATIENT_CLINIC_OR_DEPARTMENT_OTHER): Payer: BLUE CROSS/BLUE SHIELD

## 2016-08-18 ENCOUNTER — Encounter (HOSPITAL_BASED_OUTPATIENT_CLINIC_OR_DEPARTMENT_OTHER): Payer: Self-pay | Admitting: General Surgery

## 2016-08-18 ENCOUNTER — Encounter: Payer: Self-pay | Admitting: *Deleted

## 2016-08-18 ENCOUNTER — Ambulatory Visit (HOSPITAL_BASED_OUTPATIENT_CLINIC_OR_DEPARTMENT_OTHER): Payer: BLUE CROSS/BLUE SHIELD

## 2016-08-18 VITALS — BP 122/68 | HR 66 | Temp 98.2°F | Resp 17

## 2016-08-18 DIAGNOSIS — Z170 Estrogen receptor positive status [ER+]: Principal | ICD-10-CM

## 2016-08-18 DIAGNOSIS — Z5111 Encounter for antineoplastic chemotherapy: Secondary | ICD-10-CM

## 2016-08-18 DIAGNOSIS — Z5112 Encounter for antineoplastic immunotherapy: Secondary | ICD-10-CM | POA: Diagnosis not present

## 2016-08-18 DIAGNOSIS — C50411 Malignant neoplasm of upper-outer quadrant of right female breast: Secondary | ICD-10-CM | POA: Diagnosis not present

## 2016-08-18 DIAGNOSIS — Z5189 Encounter for other specified aftercare: Secondary | ICD-10-CM | POA: Diagnosis not present

## 2016-08-18 LAB — CBC WITH DIFFERENTIAL/PLATELET
BASO%: 0.3 % (ref 0.0–2.0)
Basophils Absolute: 0.1 10*3/uL (ref 0.0–0.1)
EOS ABS: 0 10*3/uL (ref 0.0–0.5)
EOS%: 0 % (ref 0.0–7.0)
HCT: 41.2 % (ref 34.8–46.6)
HGB: 13.9 g/dL (ref 11.6–15.9)
LYMPH%: 5.4 % — AB (ref 14.0–49.7)
MCH: 31 pg (ref 25.1–34.0)
MCHC: 33.7 g/dL (ref 31.5–36.0)
MCV: 92 fL (ref 79.5–101.0)
MONO#: 0.6 10*3/uL (ref 0.1–0.9)
MONO%: 3.6 % (ref 0.0–14.0)
NEUT%: 90.7 % — ABNORMAL HIGH (ref 38.4–76.8)
NEUTROS ABS: 14 10*3/uL — AB (ref 1.5–6.5)
Platelets: 200 10*3/uL (ref 145–400)
RBC: 4.47 10*6/uL (ref 3.70–5.45)
RDW: 13 % (ref 11.2–14.5)
WBC: 15.5 10*3/uL — AB (ref 3.9–10.3)
lymph#: 0.8 10*3/uL — ABNORMAL LOW (ref 0.9–3.3)

## 2016-08-18 LAB — COMPREHENSIVE METABOLIC PANEL
ALT: 11 U/L (ref 0–55)
AST: 13 U/L (ref 5–34)
Albumin: 3.8 g/dL (ref 3.5–5.0)
Alkaline Phosphatase: 80 U/L (ref 40–150)
Anion Gap: 9 mEq/L (ref 3–11)
BUN: 15.1 mg/dL (ref 7.0–26.0)
CO2: 23 meq/L (ref 22–29)
Calcium: 9.5 mg/dL (ref 8.4–10.4)
Chloride: 107 mEq/L (ref 98–109)
Creatinine: 0.7 mg/dL (ref 0.6–1.1)
EGFR: 88 mL/min/{1.73_m2} — ABNORMAL LOW (ref >90)
GLUCOSE: 112 mg/dL (ref 70–140)
POTASSIUM: 3.8 meq/L (ref 3.5–5.1)
Sodium: 140 mEq/L (ref 136–145)
TOTAL PROTEIN: 6.9 g/dL (ref 6.4–8.3)
Total Bilirubin: 0.73 mg/dL (ref 0.20–1.20)

## 2016-08-18 MED ORDER — SODIUM CHLORIDE 0.9 % IV SOLN
840.0000 mg | Freq: Once | INTRAVENOUS | Status: AC
  Administered 2016-08-18: 840 mg via INTRAVENOUS
  Filled 2016-08-18: qty 28

## 2016-08-18 MED ORDER — PEGFILGRASTIM 6 MG/0.6ML ~~LOC~~ PSKT
6.0000 mg | PREFILLED_SYRINGE | Freq: Once | SUBCUTANEOUS | Status: AC
  Administered 2016-08-18: 6 mg via SUBCUTANEOUS
  Filled 2016-08-18: qty 0.6

## 2016-08-18 MED ORDER — PALONOSETRON HCL INJECTION 0.25 MG/5ML
0.2500 mg | Freq: Once | INTRAVENOUS | Status: AC
  Administered 2016-08-18: 0.25 mg via INTRAVENOUS

## 2016-08-18 MED ORDER — DEXTROSE 5 % IV SOLN
75.0000 mg/m2 | Freq: Once | INTRAVENOUS | Status: AC
  Administered 2016-08-18: 150 mg via INTRAVENOUS
  Filled 2016-08-18: qty 15

## 2016-08-18 MED ORDER — DIPHENHYDRAMINE HCL 25 MG PO CAPS
ORAL_CAPSULE | ORAL | Status: AC
  Filled 2016-08-18: qty 2

## 2016-08-18 MED ORDER — SODIUM CHLORIDE 0.9% FLUSH
10.0000 mL | INTRAVENOUS | Status: DC | PRN
  Administered 2016-08-18: 10 mL
  Filled 2016-08-18: qty 10

## 2016-08-18 MED ORDER — SODIUM CHLORIDE 0.9 % IV SOLN
8.0000 mg/kg | Freq: Once | INTRAVENOUS | Status: AC
  Administered 2016-08-18: 672 mg via INTRAVENOUS
  Filled 2016-08-18: qty 32

## 2016-08-18 MED ORDER — SODIUM CHLORIDE 0.9 % IV SOLN
Freq: Once | INTRAVENOUS | Status: AC
  Administered 2016-08-18: 09:00:00 via INTRAVENOUS

## 2016-08-18 MED ORDER — ACETAMINOPHEN 325 MG PO TABS
ORAL_TABLET | ORAL | Status: AC
  Filled 2016-08-18: qty 2

## 2016-08-18 MED ORDER — ACETAMINOPHEN 325 MG PO TABS
650.0000 mg | ORAL_TABLET | Freq: Once | ORAL | Status: AC
  Administered 2016-08-18: 650 mg via ORAL

## 2016-08-18 MED ORDER — HEPARIN SOD (PORK) LOCK FLUSH 100 UNIT/ML IV SOLN
500.0000 [IU] | Freq: Once | INTRAVENOUS | Status: AC | PRN
  Administered 2016-08-18: 500 [IU]
  Filled 2016-08-18: qty 5

## 2016-08-18 MED ORDER — SODIUM CHLORIDE 0.9 % IV SOLN
Freq: Once | INTRAVENOUS | Status: AC
  Administered 2016-08-18: 14:00:00 via INTRAVENOUS
  Filled 2016-08-18: qty 5

## 2016-08-18 MED ORDER — SODIUM CHLORIDE 0.9 % IV SOLN
600.0000 mg | Freq: Once | INTRAVENOUS | Status: AC
  Administered 2016-08-18: 600 mg via INTRAVENOUS
  Filled 2016-08-18: qty 60

## 2016-08-18 MED ORDER — DIPHENHYDRAMINE HCL 25 MG PO CAPS
50.0000 mg | ORAL_CAPSULE | Freq: Once | ORAL | Status: AC
Start: 2016-08-18 — End: 2016-08-18
  Administered 2016-08-18: 50 mg via ORAL

## 2016-08-18 MED ORDER — PALONOSETRON HCL INJECTION 0.25 MG/5ML
INTRAVENOUS | Status: AC
  Filled 2016-08-18: qty 5

## 2016-08-18 NOTE — Patient Instructions (Signed)
[facility] Discharge Instructions for Patients Receiving Chemotherapy  Today you received the following chemotherapy agents Herceptin, Perjeta, Taxotere and Carboplatin   To help prevent nausea and vomiting after your treatment, we encourage you to take your nausea medication as directed. No Zofran for 3 days. Take Compazine instead.    If you develop nausea and vomiting that is not controlled by your nausea medication, call the clinic.   BELOW ARE SYMPTOMS THAT SHOULD BE REPORTED IMMEDIATELY:  *FEVER GREATER THAN 100.5 F  *CHILLS WITH OR WITHOUT FEVER  NAUSEA AND VOMITING THAT IS NOT CONTROLLED WITH YOUR NAUSEA MEDICATION  *UNUSUAL SHORTNESS OF BREATH  *UNUSUAL BRUISING OR BLEEDING  TENDERNESS IN MOUTH AND THROAT WITH OR WITHOUT PRESENCE OF ULCERS  *URINARY PROBLEMS  *BOWEL PROBLEMS  UNUSUAL RASH Items with * indicate a potential emergency and should be followed up as soon as possible.  Feel free to call the clinic you have any questions or concerns. The clinic phone number is [phone].  Please show the [facility] at check-in to the Emergency Department and triage nurse.  Trastuzumab injection for infusion What is this medicine? TRASTUZUMAB (tras TOO zoo mab) is a monoclonal antibody. It is used to treat breast cancer and stomach cancer. This medicine may be used for other purposes; ask your health care provider or pharmacist if you have questions. COMMON BRAND NAME(S): Herceptin What should I tell my health care provider before I take this medicine? They need to know if you have any of these conditions: -heart disease -heart failure -infection (especially a virus infection such as chickenpox, cold sores, or herpes) -lung or breathing disease, like asthma -recent or ongoing radiation therapy -an unusual or allergic reaction to trastuzumab, benzyl alcohol, or other medications, foods, dyes, or preservatives -pregnant or trying to get  pregnant -breast-feeding How should I use this medicine? This drug is given as an infusion into a vein. It is administered in a hospital or clinic by a specially trained health care professional. Talk to your pediatrician regarding the use of this medicine in children. This medicine is not approved for use in children. Overdosage: If you think you have taken too much of this medicine contact a poison control center or emergency room at once. NOTE: This medicine is only for you. Do not share this medicine with others. What if I miss a dose? It is important not to miss a dose. Call your doctor or health care professional if you are unable to keep an appointment. What may interact with this medicine? -doxorubicin -warfarin This list may not describe all possible interactions. Give your health care provider a list of all the medicines, herbs, non-prescription drugs, or dietary supplements you use. Also tell them if you smoke, drink alcohol, or use illegal drugs. Some items may interact with your medicine. What should I watch for while using this medicine? Visit your doctor for checks on your progress. Report any side effects. Continue your course of treatment even though you feel ill unless your doctor tells you to stop. Call your doctor or health care professional for advice if you get a fever, chills or sore throat, or other symptoms of a cold or flu. Do not treat yourself. Try to avoid being around people who are sick. You may experience fever, chills and shaking during your first infusion. These effects are usually mild and can be treated with other medicines. Report any side effects during the infusion to your health care professional. Fever and  chills usually do not happen with later infusions. Do not become pregnant while taking this medicine or for 7 months after stopping it. Women should inform their doctor if they wish to become pregnant or think they might be pregnant. Women of child-bearing  potential will need to have a negative pregnancy test before starting this medicine. There is a potential for serious side effects to an unborn child. Talk to your health care professional or pharmacist for more information. Do not breast-feed an infant while taking this medicine or for 7 months after stopping it. Women must use effective birth control with this medicine. What side effects may I notice from receiving this medicine? Side effects that you should report to your doctor or health care professional as soon as possible: -breathing difficulties -chest pain or palpitations -cough -dizziness or fainting -fever or chills, sore throat -skin rash, itching or hives -swelling of the legs or ankles -unusually weak or tired Side effects that usually do not require medical attention (report to your doctor or health care professional if they continue or are bothersome): -loss of appetite -headache -muscle aches -nausea This list may not describe all possible side effects. Call your doctor for medical advice about side effects. You may report side effects to FDA at 1-800-FDA-1088. Where should I keep my medicine? This drug is given in a hospital or clinic and will not be stored at home. NOTE: This sheet is a summary. It may not cover all possible information. If you have questions about this medicine, talk to your doctor, pharmacist, or health care provider.  2017 Elsevier/Gold Standard (2015-08-25 17:16:44)  Pembrolizumab injection What is this medicine? PEMBROLIZUMAB (pem broe liz ue mab) is a monoclonal antibody. It is used to treat melanoma, head and neck cancer, Hodgkin lymphoma, and non-small cell lung cancer. COMMON BRAND NAME(S): Keytruda What should I tell my health care provider before I take this medicine? They need to know if you have any of these conditions: -diabetes -immune system problems -inflammatory bowel disease -liver disease -lung or breathing  disease -lupus -organ transplant -an unusual or allergic reaction to pembrolizumab, other medicines, foods, dyes, or preservatives -pregnant or trying to get pregnant -breast-feeding How should I use this medicine? This medicine is for infusion into a vein. It is given by a health care professional in a hospital or clinic setting. A special MedGuide will be given to you before each treatment. Be sure to read this information carefully each time. Talk to your pediatrician regarding the use of this medicine in children. While this drug may be prescribed for selected conditions, precautions do apply. What if I miss a dose? It is important not to miss your dose. Call your doctor or health care professional if you are unable to keep an appointment. What may interact with this medicine? Interactions have not been studied. Give your health care provider a list of all the medicines, herbs, non-prescription drugs, or dietary supplements you use. Also tell them if you smoke, drink alcohol, or use illegal drugs. Some items may interact with your medicine. What should I watch for while using this medicine? Your condition will be monitored carefully while you are receiving this medicine. You may need blood work done while you are taking this medicine. Do not become pregnant while taking this medicine or for 4 months after stopping it. Women should inform their doctor if they wish to become pregnant or think they might be pregnant. There is a potential for serious side effects to an  unborn child. Talk to your health care professional or pharmacist for more information. Do not breast-feed an infant while taking this medicine or for 4 months after the last dose. What side effects may I notice from receiving this medicine? Side effects that you should report to your doctor or health care professional as soon as possible: -allergic reactions like skin rash, itching or hives, swelling of the face, lips, or  tongue -bloody or black, tarry -breathing problems -changes in vision -chest pain -chills -constipation -cough -dizziness or feeling faint or lightheaded -fast or irregular heartbeat -fever -flushing -hair loss -low blood counts - this medicine may decrease the number of white blood cells, red blood cells and platelets. You may be at increased risk for infections and bleeding. -muscle pain -muscle weakness -persistent headache -signs and symptoms of high blood sugar such as dizziness; dry mouth; dry skin; fruity breath; nausea; stomach pain; increased hunger or thirst; increased urination -signs and symptoms of kidney injury like trouble passing urine or change in the amount of urine -signs and symptoms of liver injury like dark urine, light-colored stools, loss of appetite, nausea, right upper belly pain, yellowing of the eyes or skin -stomach pain -sweating -weight loss Side effects that usually do not require medical attention (report to your doctor or health care professional if they continue or are bothersome): -decreased appetite -diarrhea -tiredness Where should I keep my medicine? This drug is given in a hospital or clinic and will not be stored at home.  2017 Elsevier/Gold Standard (2015-10-25 19:28:44)  Docetaxel injection What is this medicine? DOCETAXEL (doe se TAX el) is a chemotherapy drug. It targets fast dividing cells, like cancer cells, and causes these cells to die. This medicine is used to treat many types of cancers like breast cancer, certain stomach cancers, head and neck cancer, lung cancer, and prostate cancer. This medicine may be used for other purposes; ask your health care provider or pharmacist if you have questions. COMMON BRAND NAME(S): Docefrez, Taxotere What should I tell my health care provider before I take this medicine? They need to know if you have any of these conditions: -infection (especially a virus infection such as chickenpox, cold  sores, or herpes) -liver disease -low blood counts, like low white cell, platelet, or red cell counts -an unusual or allergic reaction to docetaxel, polysorbate 80, other chemotherapy agents, other medicines, foods, dyes, or preservatives -pregnant or trying to get pregnant -breast-feeding How should I use this medicine? This drug is given as an infusion into a vein. It is administered in a hospital or clinic by a specially trained health care professional. Talk to your pediatrician regarding the use of this medicine in children. Special care may be needed. Overdosage: If you think you have taken too much of this medicine contact a poison control center or emergency room at once. NOTE: This medicine is only for you. Do not share this medicine with others. What if I miss a dose? It is important not to miss your dose. Call your doctor or health care professional if you are unable to keep an appointment. What may interact with this medicine? -cyclosporine -erythromycin -ketoconazole -medicines to increase blood counts like filgrastim, pegfilgrastim, sargramostim -vaccines Talk to your doctor or health care professional before taking any of these medicines: -acetaminophen -aspirin -ibuprofen -ketoprofen -naproxen This list may not describe all possible interactions. Give your health care provider a list of all the medicines, herbs, non-prescription drugs, or dietary supplements you use. Also tell them if  you smoke, drink alcohol, or use illegal drugs. Some items may interact with your medicine. What should I watch for while using this medicine? Your condition will be monitored carefully while you are receiving this medicine. You will need important blood work done while you are taking this medicine. This drug may make you feel generally unwell. This is not uncommon, as chemotherapy can affect healthy cells as well as cancer cells. Report any side effects. Continue your course of treatment  even though you feel ill unless your doctor tells you to stop. In some cases, you may be given additional medicines to help with side effects. Follow all directions for their use. Call your doctor or health care professional for advice if you get a fever, chills or sore throat, or other symptoms of a cold or flu. Do not treat yourself. This drug decreases your body's ability to fight infections. Try to avoid being around people who are sick. This medicine may increase your risk to bruise or bleed. Call your doctor or health care professional if you notice any unusual bleeding. This medicine may contain alcohol in the product. You may get drowsy or dizzy. Do not drive, use machinery, or do anything that needs mental alertness until you know how this medicine affects you. Do not stand or sit up quickly, especially if you are an older patient. This reduces the risk of dizzy or fainting spells. Avoid alcoholic drinks. Do not become pregnant while taking this medicine. Women should inform their doctor if they wish to become pregnant or think they might be pregnant. There is a potential for serious side effects to an unborn child. Talk to your health care professional or pharmacist for more information. Do not breast-feed an infant while taking this medicine. What side effects may I notice from receiving this medicine? Side effects that you should report to your doctor or health care professional as soon as possible: -allergic reactions like skin rash, itching or hives, swelling of the face, lips, or tongue -low blood counts - This drug may decrease the number of white blood cells, red blood cells and platelets. You may be at increased risk for infections and bleeding. -signs of infection - fever or chills, cough, sore throat, pain or difficulty passing urine -signs of decreased platelets or bleeding - bruising, pinpoint red spots on the skin, black, tarry stools, nosebleeds -signs of decreased red blood cells  - unusually weak or tired, fainting spells, lightheadedness -breathing problems -fast or irregular heartbeat -low blood pressure -mouth sores -nausea and vomiting -pain, swelling, redness or irritation at the injection site -pain, tingling, numbness in the hands or feet -swelling of the ankle, feet, hands -weight gain Side effects that usually do not require medical attention (report to your doctor or health care professional if they continue or are bothersome): -bone pain -complete hair loss including hair on your head, underarms, pubic hair, eyebrows, and eyelashes -diarrhea -excessive tearing -changes in the color of fingernails -loosening of the fingernails -nausea -muscle pain -red flush to skin -sweating -weak or tired This list may not describe all possible side effects. Call your doctor for medical advice about side effects. You may report side effects to FDA at 1-800-FDA-1088. Where should I keep my medicine? This drug is given in a hospital or clinic and will not be stored at home. NOTE: This sheet is a summary. It may not cover all possible information. If you have questions about this medicine, talk to your doctor, pharmacist, or health care  provider.  2017 Elsevier/Gold Standard (2015-08-26 12:32:56)  Carboplatin injection What is this medicine? CARBOPLATIN (KAR boe pla tin) is a chemotherapy drug. It targets fast dividing cells, like cancer cells, and causes these cells to die. This medicine is used to treat ovarian cancer and many other cancers. This medicine may be used for other purposes; ask your health care provider or pharmacist if you have questions. COMMON BRAND NAME(S): Paraplatin What should I tell my health care provider before I take this medicine? They need to know if you have any of these conditions: -blood disorders -hearing problems -kidney disease -recent or ongoing radiation therapy -an unusual or allergic reaction to carboplatin, cisplatin,  other chemotherapy, other medicines, foods, dyes, or preservatives -pregnant or trying to get pregnant -breast-feeding How should I use this medicine? This drug is usually given as an infusion into a vein. It is administered in a hospital or clinic by a specially trained health care professional. Talk to your pediatrician regarding the use of this medicine in children. Special care may be needed. Overdosage: If you think you have taken too much of this medicine contact a poison control center or emergency room at once. NOTE: This medicine is only for you. Do not share this medicine with others. What if I miss a dose? It is important not to miss a dose. Call your doctor or health care professional if you are unable to keep an appointment. What may interact with this medicine? -medicines for seizures -medicines to increase blood counts like filgrastim, pegfilgrastim, sargramostim -some antibiotics like amikacin, gentamicin, neomycin, streptomycin, tobramycin -vaccines Talk to your doctor or health care professional before taking any of these medicines: -acetaminophen -aspirin -ibuprofen -ketoprofen -naproxen This list may not describe all possible interactions. Give your health care provider a list of all the medicines, herbs, non-prescription drugs, or dietary supplements you use. Also tell them if you smoke, drink alcohol, or use illegal drugs. Some items may interact with your medicine. What should I watch for while using this medicine? Your condition will be monitored carefully while you are receiving this medicine. You will need important blood work done while you are taking this medicine. This drug may make you feel generally unwell. This is not uncommon, as chemotherapy can affect healthy cells as well as cancer cells. Report any side effects. Continue your course of treatment even though you feel ill unless your doctor tells you to stop. In some cases, you may be given additional  medicines to help with side effects. Follow all directions for their use. Call your doctor or health care professional for advice if you get a fever, chills or sore throat, or other symptoms of a cold or flu. Do not treat yourself. This drug decreases your body's ability to fight infections. Try to avoid being around people who are sick. This medicine may increase your risk to bruise or bleed. Call your doctor or health care professional if you notice any unusual bleeding. Be careful brushing and flossing your teeth or using a toothpick because you may get an infection or bleed more easily. If you have any dental work done, tell your dentist you are receiving this medicine. Avoid taking products that contain aspirin, acetaminophen, ibuprofen, naproxen, or ketoprofen unless instructed by your doctor. These medicines may hide a fever. Do not become pregnant while taking this medicine. Women should inform their doctor if they wish to become pregnant or think they might be pregnant. There is a potential for serious side effects to an  unborn child. Talk to your health care professional or pharmacist for more information. Do not breast-feed an infant while taking this medicine. What side effects may I notice from receiving this medicine? Side effects that you should report to your doctor or health care professional as soon as possible: -allergic reactions like skin rash, itching or hives, swelling of the face, lips, or tongue -signs of infection - fever or chills, cough, sore throat, pain or difficulty passing urine -signs of decreased platelets or bleeding - bruising, pinpoint red spots on the skin, black, tarry stools, nosebleeds -signs of decreased red blood cells - unusually weak or tired, fainting spells, lightheadedness -breathing problems -changes in hearing -changes in vision -chest pain -high blood pressure -low blood counts - This drug may decrease the number of white blood cells, red blood  cells and platelets. You may be at increased risk for infections and bleeding. -nausea and vomiting -pain, swelling, redness or irritation at the injection site -pain, tingling, numbness in the hands or feet -problems with balance, talking, walking -trouble passing urine or change in the amount of urine Side effects that usually do not require medical attention (report to your doctor or health care professional if they continue or are bothersome): -hair loss -loss of appetite -metallic taste in the mouth or changes in taste This list may not describe all possible side effects. Call your doctor for medical advice about side effects. You may report side effects to FDA at 1-800-FDA-1088. Where should I keep my medicine? This drug is given in a hospital or clinic and will not be stored at home. NOTE: This sheet is a summary. It may not cover all possible information. If you have questions about this medicine, talk to your doctor, pharmacist, or health care provider.  2017 Elsevier/Gold Standard (2007-10-29 14:38:05)

## 2016-08-22 NOTE — Assessment & Plan Note (Signed)
Rt Biopsy 11oclock and 2 o clock: Grade 3 IDC with LVI; LN biopsy: Positive IDC; 11oclock: Er 100%, PR 90%, Ki 67: 90%; Her 2 Neg Heterogeneous; LN: Er 100%, PR: 90%; Ki67: 60%; Her 2 Pos Ratio 2.37; 2oclock: Er 100%, PR 90%; Ki 67: 90%; Her 2 Positive 2.52  Rt Breast 2 masses at 11-12:30 Position spanning 8 cm; Additional hihly susp mass at 2 o clock: 1.5 cm; LN with cort thickening 2.7 cm  Pathology and radiology counseling: Discussed with the patient, the details of pathology including the type of breast cancer,the clinical staging, the significance of ER, PR and HER-2/neu receptors and the implications for treatment. After reviewing the pathology in detail, we proceeded to discuss the different treatment options between surgery, radiation, chemotherapy, antiestrogen therapies. Breast MRI on 08/13/2016: 8 cm span of breast cancer with multiple enlarged axillary lymph nodes.  Recommendation based on multidisciplinary tumor board: 1. Neoadjuvant chemotherapy with TCH Perjeta 6 cycles followed by Herceptin maintenance for 1 year 2. Followed by breast conserving surgery if possible with sentinel lymph node study 3. Followed by adjuvant radiation therapy 4. Followed by Anti-estrogen therapy ------------------------------------------------------------------------------------------------------------------------------------------------------------------------------------------------------------------------------------------------------ Chemotherapy Counseling: I discussed the risks and benefits of chemotherapy including the risks of nausea/ vomiting, risk of infection from low WBC count, fatigue due to chemo or anemia, bruising or bleeding due to low platelets, mouth sores, loss/ change in taste and decreased appetite. Liver and kidney function will be monitored through out chemotherapy as abnormalities in liver and kidney function may be a side effect of treatment. Cardiac dysfunction due to Herceptin and  Perjeta were discussed in detail. Risk of permanent bone marrow dysfunction due to chemo were also discussed.  Plan: 1. Port placement to happen on Thursday 2. Echocardiogram 3. Chemotherapy class 4. CT chest abdomen pelvis and bone scan for staging

## 2016-08-23 ENCOUNTER — Encounter: Payer: Self-pay | Admitting: Hematology and Oncology

## 2016-08-23 ENCOUNTER — Other Ambulatory Visit (HOSPITAL_BASED_OUTPATIENT_CLINIC_OR_DEPARTMENT_OTHER): Payer: BLUE CROSS/BLUE SHIELD

## 2016-08-23 ENCOUNTER — Ambulatory Visit (HOSPITAL_BASED_OUTPATIENT_CLINIC_OR_DEPARTMENT_OTHER): Payer: BLUE CROSS/BLUE SHIELD | Admitting: Hematology and Oncology

## 2016-08-23 DIAGNOSIS — R5383 Other fatigue: Secondary | ICD-10-CM

## 2016-08-23 DIAGNOSIS — Z170 Estrogen receptor positive status [ER+]: Secondary | ICD-10-CM | POA: Diagnosis not present

## 2016-08-23 DIAGNOSIS — C50411 Malignant neoplasm of upper-outer quadrant of right female breast: Secondary | ICD-10-CM

## 2016-08-23 DIAGNOSIS — D701 Agranulocytosis secondary to cancer chemotherapy: Secondary | ICD-10-CM

## 2016-08-23 LAB — CBC WITH DIFFERENTIAL/PLATELET
BASO%: 0.6 % (ref 0.0–2.0)
Basophils Absolute: 0 10*3/uL (ref 0.0–0.1)
EOS ABS: 0 10*3/uL (ref 0.0–0.5)
EOS%: 1 % (ref 0.0–7.0)
HEMATOCRIT: 41.9 % (ref 34.8–46.6)
HGB: 14 g/dL (ref 11.6–15.9)
LYMPH#: 0.8 10*3/uL — AB (ref 0.9–3.3)
LYMPH%: 32 % (ref 14.0–49.7)
MCH: 30.8 pg (ref 25.1–34.0)
MCHC: 33.3 g/dL (ref 31.5–36.0)
MCV: 92.5 fL (ref 79.5–101.0)
MONO#: 0 10*3/uL — AB (ref 0.1–0.9)
MONO%: 2 % (ref 0.0–14.0)
NEUT%: 64.4 % (ref 38.4–76.8)
NEUTROS ABS: 1.6 10*3/uL (ref 1.5–6.5)
PLATELETS: 115 10*3/uL — AB (ref 145–400)
RBC: 4.53 10*6/uL (ref 3.70–5.45)
RDW: 13.1 % (ref 11.2–14.5)
WBC: 2.5 10*3/uL — AB (ref 3.9–10.3)

## 2016-08-23 LAB — COMPREHENSIVE METABOLIC PANEL
ALBUMIN: 3.7 g/dL (ref 3.5–5.0)
ALK PHOS: 84 U/L (ref 40–150)
ALT: 26 U/L (ref 0–55)
ANION GAP: 8 meq/L (ref 3–11)
AST: 21 U/L (ref 5–34)
BILIRUBIN TOTAL: 0.84 mg/dL (ref 0.20–1.20)
BUN: 16.6 mg/dL (ref 7.0–26.0)
CALCIUM: 9.2 mg/dL (ref 8.4–10.4)
CO2: 26 meq/L (ref 22–29)
CREATININE: 0.8 mg/dL (ref 0.6–1.1)
Chloride: 106 mEq/L (ref 98–109)
EGFR: 83 mL/min/{1.73_m2} — AB (ref >90)
Glucose: 108 mg/dl (ref 70–140)
Potassium: 3.9 mEq/L (ref 3.5–5.1)
Sodium: 140 mEq/L (ref 136–145)
TOTAL PROTEIN: 6.6 g/dL (ref 6.4–8.3)

## 2016-08-23 NOTE — Progress Notes (Signed)
Patient Care Team: [name], MD as PCP - General (Pediatrics)  DIAGNOSIS:  Encounter Diagnosis  Name Primary?  . Malignant neoplasm of upper-outer quadrant of right breast in female, estrogen receptor positive ([place])     SUMMARY OF ONCOLOGIC HISTORY:   Breast cancer of upper-outer quadrant of right female breast ([place])   07/24/2016 Mammogram    Rt Breast 2 masses at 11-12:30 Position spanning 8 cm; Additional hihly susp mass at 2 o clock: 1.5 cm; LN with cort thickening 2.7 cm      07/27/2016 Initial Diagnosis    Rt Biopsy 11oclock and 2 o clock: Grade 3 IDC with LVI; LN biopsy: Positive IDC; 11oclock: Er 100%, PR 90%, Ki 67: 90%; Her 2 Neg Heterogeneous; LN: Er 100%, PR: 90%; Ki67: 60%; Her 2 Pos Ratio 2.37; 2oclock: Er 100%, PR 90%; Ki 67: 90%; Her 2 Positive 2.52      08/16/2016 -  Neo-Adjuvant Chemotherapy    Neoadjuvant chemotherapy with TCH Perjeta       CHIEF COMPLIANT: cycle 1 day 8 TCH Perjeta  INTERVAL HISTORY: Cynthia Chavez is a 61 year old with above-mentioned history of right breast cancer currently on [facility] Perjeta neoadjuvant chemotherapy. 3 cycle 1 day 8. She tolerated cycle 1 chemotherapy reasonably well. She did have nausea for which she took Zofran and Compazine around-the-clock. She also had fatigue related to chemotherapy as well as lack of taste.  REVIEW OF SYSTEMS:   Constitutional: Denies fevers, chills or abnormal weight loss Eyes: Denies blurriness of vision Ears, nose, mouth, throat, and face: Denies mucositis or sore throat Respiratory: Denies cough, dyspnea or wheezes Cardiovascular: Denies palpitation, chest discomfort Gastrointestinal:  Nausea, constipation alternating with diarrhea Skin: Denies abnormal skin rashes Lymphatics: Denies new lymphadenopathy or easy bruising Neurological:Denies numbness, tingling or new weaknesses Behavioral/Psych: Mood is stable, no new changes  Extremities: No lower extremity edema Breast:  denies any pain or  lumps or nodules in either breasts All other systems were reviewed with the patient and are negative.  I have reviewed the past medical history, past surgical history, social history and family history with the patient and they are unchanged from previous note.  ALLERGIES:  is allergic to ivp dye [iodinated diagnostic agents]; other; dilaudid [hydromorphone hcl]; asa [aspirin]; and codeine.  MEDICATIONS:  Current Outpatient Prescriptions  Medication Sig Dispense Refill  . aspirin EC 81 MG tablet Take 81 mg by mouth daily.    Marland Kitchen dexamethasone (DECADRON) 4 MG tablet Take 1 tablet (4 mg total) by mouth daily. Take 1 tab day before and 1 tab day after chemo 15 tablet 0  . levothyroxine (SYNTHROID, LEVOTHROID) 125 MCG tablet Take 1 tablet (125 mcg total) by mouth daily before breakfast. 90 tablet 1  . lidocaine-prilocaine (EMLA) cream Apply to affected area once 30 g 3  . LORazepam (ATIVAN) 0.5 MG tablet Take 1 tablet (0.5 mg total) by mouth at bedtime. As needed for sleep 30 tablet 0  . neomycin-polymyxin-hydrocortisone (CORTISPORIN) otic solution Place 4 drops into the left ear 4 (four) times daily. 10 mL 0  . ondansetron (ZOFRAN) 8 MG tablet Take 1 tablet (8 mg total) by mouth 2 (two) times daily as needed for refractory nausea / vomiting. Start on day 3 after chemo. 30 tablet 1  . prochlorperazine (COMPAZINE) 10 MG tablet Take 1 tablet (10 mg total) by mouth every 6 (six) hours as needed (Nausea or vomiting). 30 tablet 1   No current facility-administered medications for this visit.  PHYSICAL EXAMINATION: ECOG PERFORMANCE STATUS: 1 - Symptomatic but completely ambulatory  Vitals:   08/23/16 1215  BP: 94/69  Pulse: 96  Resp: 17  Temp: 97.8 F (36.6 C)   Filed Weights   08/23/16 1215  Weight: 176 lb 6.4 oz (80 kg)    GENERAL:alert, no distress and comfortable SKIN: skin color, texture, turgor are normal, no rashes or significant lesions EYES: normal, Conjunctiva are pink and  non-injected, sclera clear OROPHARYNX:no exudate, no erythema and lips, buccal mucosa, and tongue normal  NECK: supple, thyroid normal size, non-tender, without nodularity LYMPH:  no palpable lymphadenopathy in the cervical, axillary or inguinal LUNGS: clear to auscultation and percussion with normal breathing effort HEART: regular rate & rhythm and no murmurs and no lower extremity edema ABDOMEN:abdomen soft, non-tender and normal bowel sounds MUSCULOSKELETAL:no cyanosis of digits and no clubbing  NEURO: alert & oriented x 3 with fluent speech, no focal motor/sensory deficits EXTREMITIES: No lower extremity edema   LABORATORY DATA:  I have reviewed the data as listed   Chemistry      Component Value Date/Time   NA 140 08/23/2016 1201   K 3.9 08/23/2016 1201   CL 107 08/14/2016 1605   CO2 26 08/23/2016 1201   BUN 16.6 08/23/2016 1201   CREATININE 0.8 08/23/2016 1201      Component Value Date/Time   CALCIUM 9.2 08/23/2016 1201   ALKPHOS 84 08/23/2016 1201   AST 21 08/23/2016 1201   ALT 26 08/23/2016 1201   BILITOT 0.84 08/23/2016 1201       Lab Results  Component Value Date   WBC 2.5 (L) 08/23/2016   HGB 14.0 08/23/2016   HCT 41.9 08/23/2016   MCV 92.5 08/23/2016   PLT 115 (L) 08/23/2016   NEUTROABS 1.6 08/23/2016    ASSESSMENT & PLAN:  Breast cancer of upper-outer quadrant of right female breast ([place]) Rt Biopsy 11oclock and 2 o clock: Grade 3 IDC with LVI; LN biopsy: Positive IDC; 11oclock: Er 100%, PR 90%, Ki 67: 90%; Her 2 Neg Heterogeneous; LN: Er 100%, PR: 90%; Ki67: 60%; Her 2 Pos Ratio 2.37; 2oclock: Er 100%, PR 90%; Ki 67: 90%; Her 2 Positive 2.52  Rt Breast 2 masses at 11-12:30 Position spanning 8 cm; Additional hihly susp mass at 2 o clock: 1.5 cm; LN with cort thickening 2.7 cm Breast MRI on 08/13/2016: 8 cm span of breast cancer with multiple enlarged axillary lymph nodes. CT CAP: 08/17/16: 4 mm right middle lobe pulmonary nodule unlikely to be metastatic  disease Bone scan 08/17/2016: No evidence of metastatic disease  Recommendation based on multidisciplinary tumor board: 1. Neoadjuvant chemotherapy with TCH Perjeta 6 cycles followed by Herceptin maintenance for 1 year 2. Followed by breast conserving surgery if possible with sentinel lymph node study 3. Followed by adjuvant radiation therapy 4. Followed by Anti-estrogen therapy ------------------------------------------------------------------------------------------------------------------------------------------------------------------------------------------------------------------------------ Chemotherapy toxicities:  1. Nausea grade 1: Patient with around-the-clock Zofran and Compazine and this helped her. 2. Fatigue 3. Loss of taste 4. Chemotherapy-induced neutropenia: ANC today is 1.6 area will keep the dose at the same for the next treatment. 5. Constipation alternating with diarrhea: Constipation due to nausea medications and diarrhea due to Perjeta. Return to clinic in 2 weeks for cycle 2  I spent 25 minutes talking to the patient of which more than half was spent in counseling and coordination of care.  No orders of the defined types were placed in this encounter.  The patient has a good understanding of the overall plan.  she agrees with it. she will call with any problems that may develop before the next visit here.   Rulon Eisenmenger, MD 08/23/16

## 2016-08-24 ENCOUNTER — Other Ambulatory Visit: Payer: Self-pay | Admitting: Emergency Medicine

## 2016-08-24 ENCOUNTER — Telehealth: Payer: Self-pay | Admitting: Medical Oncology

## 2016-08-24 MED ORDER — CIPROFLOXACIN HCL 500 MG PO TABS: 500.0000 mg | ORAL_TABLET | Freq: Two times a day (BID) | ORAL | 0 refills | Status: DC

## 2016-08-24 MED ORDER — HYDROCORTISONE ACETATE 25 MG RE SUPP: 25.0000 mg | Freq: Two times a day (BID) | RECTAL | 0 refills | Status: DC

## 2016-08-24 MED ORDER — FLUCONAZOLE 100 MG PO TABS: 100.0000 mg | ORAL_TABLET | Freq: Every day | ORAL | 0 refills | Status: DC

## 2016-08-24 NOTE — Telephone Encounter (Signed)
Pt calls and reports " vaginal and rectal burning burning and burning with urination x 24 -48 hours. It brought out my hemorrhoids and I have been sitting in epsom salts and it has relieved hemorrhoid pain  but I still have vaginal and burning with urination". She called her PCP but office is closed. Pt saw Gudena yesterday.

## 2016-08-25 ENCOUNTER — Telehealth: Payer: Self-pay | Admitting: Pediatrics

## 2016-08-25 ENCOUNTER — Other Ambulatory Visit: Payer: Self-pay | Admitting: *Deleted

## 2016-08-25 DIAGNOSIS — N390 Urinary tract infection, site not specified: Secondary | ICD-10-CM

## 2016-08-25 NOTE — Telephone Encounter (Signed)
yes

## 2016-08-25 NOTE — Telephone Encounter (Signed)
Patient started antibiotic on 08/24/2016 for UTI symptoms.  Patient states that Dr. Lindi Adie would like for her to leave a Urine sample here at this office.  Patient aware to come in after finishing antibiotic to come in and leave a urine sample. Orders placed.

## 2016-09-01 ENCOUNTER — Encounter: Payer: Self-pay | Admitting: Hematology and Oncology

## 2016-09-01 NOTE — Progress Notes (Signed)
Faxed FMLA paperwork for pt's spouse to [employer] on 08/30/16 to fax [phone]

## 2016-09-04 ENCOUNTER — Other Ambulatory Visit: Payer: BLUE CROSS/BLUE SHIELD

## 2016-09-04 DIAGNOSIS — N390 Urinary tract infection, site not specified: Secondary | ICD-10-CM

## 2016-09-04 LAB — URINALYSIS, COMPLETE
Bilirubin, UA: NEGATIVE
Glucose, UA: NEGATIVE
Nitrite, UA: POSITIVE — AB
PH UA: 6 (ref 5.0–7.5)
PROTEIN UA: NEGATIVE
RBC UA: NEGATIVE
Specific Gravity, UA: 1.02 (ref 1.005–1.030)
Urobilinogen, Ur: 0.2 mg/dL (ref 0.2–1.0)

## 2016-09-04 LAB — MICROSCOPIC EXAMINATION

## 2016-09-06 ENCOUNTER — Other Ambulatory Visit: Payer: Self-pay | Admitting: Nurse Practitioner

## 2016-09-06 LAB — URINE CULTURE

## 2016-09-06 MED ORDER — CIPROFLOXACIN HCL 500 MG PO TABS: 500.0000 mg | ORAL_TABLET | Freq: Two times a day (BID) | ORAL | 0 refills | Status: DC

## 2016-09-07 ENCOUNTER — Other Ambulatory Visit: Payer: Self-pay | Admitting: Pediatrics

## 2016-09-07 ENCOUNTER — Other Ambulatory Visit: Payer: Self-pay | Admitting: Nurse Practitioner

## 2016-09-07 ENCOUNTER — Telehealth: Payer: Self-pay

## 2016-09-07 ENCOUNTER — Telehealth: Payer: Self-pay | Admitting: Nurse Practitioner

## 2016-09-07 MED ORDER — AMOXICILLIN-POT CLAVULANATE 875-125 MG PO TABS: 1.0000 | ORAL_TABLET | Freq: Two times a day (BID) | ORAL | 0 refills | Status: DC

## 2016-09-07 MED ORDER — FLUCONAZOLE 150 MG PO TABS: 150.0000 mg | ORAL_TABLET | Freq: Once | ORAL | 0 refills | Status: AC

## 2016-09-07 NOTE — Assessment & Plan Note (Signed)
Rt Biopsy 11oclock and 2 o clock: Grade 3 IDC with LVI; LN biopsy: Positive IDC; 11oclock: Er 100%, PR 90%, Ki 67: 90%; Her 2 Neg Heterogeneous; LN: Er 100%, PR: 90%; Ki67: 60%; Her 2 Pos Ratio 2.37; 2oclock: Er 100%, PR 90%; Ki 67: 90%; Her 2 Positive 2.52  Rt Breast 2 masses at 11-12:30 Position spanning 8 cm; Additional hihly susp mass at 2 o clock: 1.5 cm; LN with cort thickening 2.7 cm Breast MRI on 08/13/2016: 8 cm span of breast cancer with multiple enlarged axillary lymph nodes. CT CAP: 08/17/16: 4 mm right middle lobe pulmonary nodule unlikely to be metastatic disease Bone scan 08/17/2016: No evidence of metastatic disease  Recommendationbased on multidisciplinary tumor board: 1. Neoadjuvant chemotherapy with TCH Perjeta 6 cycles followed by Herceptin maintenance for 1 year 2. Followed by breast conserving surgery if possible with sentinel lymph node study 3. Followed by adjuvant radiation therapy 4. Followed by Anti-estrogen therapy -------------------------------------------------------------------------------------------------------------------------------------------------------- Current treatment: Cycle 2 of neoadjuvant TCH Perjeta Chemotherapy toxicities:  1. Nausea grade 1: Patient with around-the-clock Zofran and Compazine and this helped her. 2. Fatigue 3. Loss of taste 4. Chemotherapy-induced neutropenia: ANC today is 1.6 area will keep the dose at the same for the next treatment. 5. Constipation alternating with diarrhea: Constipation due to nausea medications and diarrhea due to Perjeta. Return to clinic in 3 weeks for cycle 3

## 2016-09-07 NOTE — Telephone Encounter (Signed)
Pt said PCP said she still has her infection and she is wanting direction on antibiotics and chemo appt tomorrow.  She had ua at PCP office that was still positive. Pt asked PCP not to rx antibiotic until she talked with Stanton. Instructed pt it was appropriate for PCP to take care of UTI.  PCP has prescribed augmentin. PCP will be faxing ua report.   Instructed pt to keep appts tomorrow, lab/flush/MD/infusion.

## 2016-09-08 ENCOUNTER — Other Ambulatory Visit (HOSPITAL_BASED_OUTPATIENT_CLINIC_OR_DEPARTMENT_OTHER): Payer: BLUE CROSS/BLUE SHIELD

## 2016-09-08 ENCOUNTER — Encounter: Payer: Self-pay | Admitting: Hematology and Oncology

## 2016-09-08 ENCOUNTER — Ambulatory Visit: Payer: BLUE CROSS/BLUE SHIELD

## 2016-09-08 ENCOUNTER — Ambulatory Visit (HOSPITAL_BASED_OUTPATIENT_CLINIC_OR_DEPARTMENT_OTHER): Payer: BLUE CROSS/BLUE SHIELD

## 2016-09-08 ENCOUNTER — Ambulatory Visit (HOSPITAL_BASED_OUTPATIENT_CLINIC_OR_DEPARTMENT_OTHER): Payer: BLUE CROSS/BLUE SHIELD | Admitting: Hematology and Oncology

## 2016-09-08 DIAGNOSIS — C50411 Malignant neoplasm of upper-outer quadrant of right female breast: Secondary | ICD-10-CM

## 2016-09-08 DIAGNOSIS — Z5112 Encounter for antineoplastic immunotherapy: Secondary | ICD-10-CM | POA: Diagnosis not present

## 2016-09-08 DIAGNOSIS — Z5111 Encounter for antineoplastic chemotherapy: Secondary | ICD-10-CM

## 2016-09-08 DIAGNOSIS — D701 Agranulocytosis secondary to cancer chemotherapy: Secondary | ICD-10-CM

## 2016-09-08 DIAGNOSIS — Z95828 Presence of other vascular implants and grafts: Secondary | ICD-10-CM

## 2016-09-08 DIAGNOSIS — Z170 Estrogen receptor positive status [ER+]: Principal | ICD-10-CM

## 2016-09-08 HISTORY — DX: Presence of other vascular implants and grafts: Z95.828

## 2016-09-08 LAB — CBC WITH DIFFERENTIAL/PLATELET
BASO%: 0.1 % (ref 0.0–2.0)
BASOS ABS: 0 10*3/uL (ref 0.0–0.1)
EOS%: 1.5 % (ref 0.0–7.0)
Eosinophils Absolute: 0.1 10*3/uL (ref 0.0–0.5)
HEMATOCRIT: 33.8 % — AB (ref 34.8–46.6)
HGB: 11.6 g/dL (ref 11.6–15.9)
LYMPH%: 16.8 % (ref 14.0–49.7)
MCH: 31.2 pg (ref 25.1–34.0)
MCHC: 34.3 g/dL (ref 31.5–36.0)
MCV: 90.9 fL (ref 79.5–101.0)
MONO#: 0.9 10*3/uL (ref 0.1–0.9)
MONO%: 10.4 % (ref 0.0–14.0)
NEUT#: 6.2 10*3/uL (ref 1.5–6.5)
NEUT%: 71.2 % (ref 38.4–76.8)
PLATELETS: 240 10*3/uL (ref 145–400)
RBC: 3.72 10*6/uL (ref 3.70–5.45)
RDW: 14 % (ref 11.2–14.5)
WBC: 8.7 10*3/uL (ref 3.9–10.3)
lymph#: 1.5 10*3/uL (ref 0.9–3.3)

## 2016-09-08 LAB — COMPREHENSIVE METABOLIC PANEL
ALBUMIN: 3.4 g/dL — AB (ref 3.5–5.0)
ALT: 17 U/L (ref 0–55)
AST: 12 U/L (ref 5–34)
Alkaline Phosphatase: 82 U/L (ref 40–150)
Anion Gap: 8 mEq/L (ref 3–11)
BUN: 14.3 mg/dL (ref 7.0–26.0)
CHLORIDE: 110 meq/L — AB (ref 98–109)
CO2: 26 meq/L (ref 22–29)
Calcium: 9 mg/dL (ref 8.4–10.4)
Creatinine: 0.6 mg/dL (ref 0.6–1.1)
GLUCOSE: 96 mg/dL (ref 70–140)
POTASSIUM: 3.6 meq/L (ref 3.5–5.1)
SODIUM: 144 meq/L (ref 136–145)
Total Bilirubin: 0.4 mg/dL (ref 0.20–1.20)
Total Protein: 6.2 g/dL — ABNORMAL LOW (ref 6.4–8.3)

## 2016-09-08 MED ORDER — SODIUM CHLORIDE 0.9% FLUSH
10.0000 mL | INTRAVENOUS | Status: DC | PRN
  Administered 2016-09-08: 10 mL via INTRAVENOUS
  Filled 2016-09-08: qty 10

## 2016-09-08 MED ORDER — SODIUM CHLORIDE 0.9 % IV SOLN
Freq: Once | INTRAVENOUS | Status: AC
  Administered 2016-09-08: 09:00:00 via INTRAVENOUS

## 2016-09-08 MED ORDER — DOCETAXEL CHEMO INJECTION 160 MG/16ML
75.0000 mg/m2 | Freq: Once | INTRAVENOUS | Status: AC
  Administered 2016-09-08: 150 mg via INTRAVENOUS
  Filled 2016-09-08: qty 15

## 2016-09-08 MED ORDER — ACETAMINOPHEN 325 MG PO TABS
ORAL_TABLET | ORAL | Status: AC
  Filled 2016-09-08: qty 2

## 2016-09-08 MED ORDER — PALONOSETRON HCL INJECTION 0.25 MG/5ML
0.2500 mg | Freq: Once | INTRAVENOUS | Status: AC
  Administered 2016-09-08: 0.25 mg via INTRAVENOUS

## 2016-09-08 MED ORDER — DIPHENHYDRAMINE HCL 25 MG PO CAPS
50.0000 mg | ORAL_CAPSULE | Freq: Once | ORAL | Status: AC
  Administered 2016-09-08: 50 mg via ORAL

## 2016-09-08 MED ORDER — SODIUM CHLORIDE 0.9 % IV SOLN
Freq: Once | INTRAVENOUS | Status: AC
  Administered 2016-09-08: 09:00:00 via INTRAVENOUS
  Filled 2016-09-08: qty 5

## 2016-09-08 MED ORDER — SODIUM CHLORIDE 0.9% FLUSH
10.0000 mL | INTRAVENOUS | Status: DC | PRN
  Administered 2016-09-08: 10 mL
  Filled 2016-09-08: qty 10

## 2016-09-08 MED ORDER — SODIUM CHLORIDE 0.9 % IV SOLN
420.0000 mg | Freq: Once | INTRAVENOUS | Status: AC
  Administered 2016-09-08: 420 mg via INTRAVENOUS
  Filled 2016-09-08: qty 14

## 2016-09-08 MED ORDER — PEGFILGRASTIM 6 MG/0.6ML ~~LOC~~ PSKT
6.0000 mg | PREFILLED_SYRINGE | Freq: Once | SUBCUTANEOUS | Status: AC
  Administered 2016-09-08: 6 mg via SUBCUTANEOUS
  Filled 2016-09-08: qty 0.6

## 2016-09-08 MED ORDER — PALONOSETRON HCL INJECTION 0.25 MG/5ML
INTRAVENOUS | Status: AC
  Filled 2016-09-08: qty 5

## 2016-09-08 MED ORDER — SODIUM CHLORIDE 0.9 % IV SOLN
600.0000 mg | Freq: Once | INTRAVENOUS | Status: AC
  Administered 2016-09-08: 600 mg via INTRAVENOUS
  Filled 2016-09-08: qty 60

## 2016-09-08 MED ORDER — TRASTUZUMAB CHEMO 150 MG IV SOLR
6.0000 mg/kg | Freq: Once | INTRAVENOUS | Status: AC
  Administered 2016-09-08: 504 mg via INTRAVENOUS
  Filled 2016-09-08: qty 24

## 2016-09-08 MED ORDER — HEPARIN SOD (PORK) LOCK FLUSH 100 UNIT/ML IV SOLN
500.0000 [IU] | Freq: Once | INTRAVENOUS | Status: AC | PRN
  Administered 2016-09-08: 500 [IU]
  Filled 2016-09-08: qty 5

## 2016-09-08 MED ORDER — ACETAMINOPHEN 325 MG PO TABS
650.0000 mg | ORAL_TABLET | Freq: Once | ORAL | Status: AC
  Administered 2016-09-08: 650 mg via ORAL

## 2016-09-08 MED ORDER — DIPHENHYDRAMINE HCL 25 MG PO CAPS
ORAL_CAPSULE | ORAL | Status: AC
  Filled 2016-09-08: qty 2

## 2016-09-08 NOTE — Progress Notes (Signed)
Pt is approved for the $1000 [employer] grant.  

## 2016-09-08 NOTE — Progress Notes (Signed)
Patient Care Team: [name], MD as PCP - General (Pediatrics)  DIAGNOSIS:  Encounter Diagnosis  Name Primary?  . Malignant neoplasm of upper-outer quadrant of right breast in female, estrogen receptor positive ([place])     SUMMARY OF ONCOLOGIC HISTORY:   Breast cancer of upper-outer quadrant of right female breast ([place])   07/24/2016 Mammogram    Rt Breast 2 masses at 11-12:30 Position spanning 8 cm; Additional hihly susp mass at 2 o clock: 1.5 cm; LN with cort thickening 2.7 cm      07/27/2016 Initial Diagnosis    Rt Biopsy 11oclock and 2 o clock: Grade 3 IDC with LVI; LN biopsy: Positive IDC; 11oclock: Er 100%, PR 90%, Ki 67: 90%; Her 2 Neg Heterogeneous; LN: Er 100%, PR: 90%; Ki67: 60%; Her 2 Pos Ratio 2.37; 2oclock: Er 100%, PR 90%; Ki 67: 90%; Her 2 Positive 2.52      08/16/2016 -  Neo-Adjuvant Chemotherapy    Neoadjuvant chemotherapy with [place] Perjeta       CHIEF COMPLIANT: Cycle 2 of neoadjuvant TCH Perjeta  INTERVAL HISTORY: Cynthia Chavez is a 61 year old with above-mentioned history of right breast cancer is currently on neoadjuvant chemotherapy with [place]. She is here today to receive cycle 2 of treatment. After cycle 1 of the chemotherapy she had mild nausea, fatigue, loss of taste, constipation alternating with diarrhea. Most of these symptoms have improved. Patient had a rectal infection and was treated with antibiotics. Denies any fevers or chills.  REVIEW OF SYSTEMS:   Constitutional: Denies fevers, chills or abnormal weight loss Eyes: Denies blurriness of vision Ears, nose, mouth, throat, and face: Denies mucositis or sore throat Respiratory: Denies cough, dyspnea or wheezes Cardiovascular: Denies palpitation, chest discomfort Gastrointestinal:  Mild nausea, rectal infection Skin: Denies abnormal skin rashes Lymphatics: Denies new lymphadenopathy or easy bruising Neurological:Denies numbness, tingling or new weaknesses Behavioral/Psych: Mood is stable,  no new changes  Extremities: No lower extremity edema  All other systems were reviewed with the patient and are negative.  I have reviewed the past medical history, past surgical history, social history and family history with the patient and they are unchanged from previous note.  ALLERGIES:  is allergic to ivp dye [iodinated diagnostic agents]; other; dilaudid [hydromorphone hcl]; asa [aspirin]; and codeine.  MEDICATIONS:  Current Outpatient Prescriptions  Medication Sig Dispense Refill  . amoxicillin-clavulanate (AUGMENTIN) 875-125 MG tablet Take 1 tablet by mouth 2 (two) times daily. 20 tablet 0  . aspirin EC 81 MG tablet Take 81 mg by mouth daily.    Marland Kitchen dexamethasone (DECADRON) 4 MG tablet Take 1 tablet (4 mg total) by mouth daily. Take 1 tab day before and 1 tab day after chemo 15 tablet 0  . hydrocortisone (ANUSOL-HC) 25 MG suppository Place 1 suppository (25 mg total) rectally 2 (two) times daily. 12 suppository 0  . levothyroxine (SYNTHROID, LEVOTHROID) 125 MCG tablet Take 1 tablet (125 mcg total) by mouth daily before breakfast. 90 tablet 1  . lidocaine-prilocaine (EMLA) cream Apply to affected area once 30 g 3  . LORazepam (ATIVAN) 0.5 MG tablet Take 1 tablet (0.5 mg total) by mouth at bedtime. As needed for sleep 30 tablet 0  . neomycin-polymyxin-hydrocortisone (CORTISPORIN) otic solution Place 4 drops into the left ear 4 (four) times daily. 10 mL 0  . ondansetron (ZOFRAN) 8 MG tablet Take 1 tablet (8 mg total) by mouth 2 (two) times daily as needed for refractory nausea / vomiting. Start on day 3 after chemo.  30 tablet 1  . prochlorperazine (COMPAZINE) 10 MG tablet Take 1 tablet (10 mg total) by mouth every 6 (six) hours as needed (Nausea or vomiting). 30 tablet 1   No current facility-administered medications for this visit.     PHYSICAL EXAMINATION: ECOG PERFORMANCE STATUS: 1 - Symptomatic but completely ambulatory  Vitals:   09/08/16 0816  BP: 135/76  Pulse: 72  Resp:  18  Temp: 97.5 F (36.4 C)   Filed Weights   09/08/16 0816  Weight: 176 lb 1.6 oz (79.9 kg)    GENERAL:alert, no distress and comfortable SKIN: skin color, texture, turgor are normal, no rashes or significant lesions EYES: normal, Conjunctiva are pink and non-injected, sclera clear OROPHARYNX:no exudate, no erythema and lips, buccal mucosa, and tongue normal  NECK: supple, thyroid normal size, non-tender, without nodularity LYMPH:  no palpable lymphadenopathy in the cervical, axillary or inguinal LUNGS: clear to auscultation and percussion with normal breathing effort HEART: regular rate & rhythm and no murmurs and no lower extremity edema ABDOMEN:abdomen soft, non-tender and normal bowel sounds MUSCULOSKELETAL:no cyanosis of digits and no clubbing  NEURO: alert & oriented x 3 with fluent speech, no focal motor/sensory deficits EXTREMITIES: No lower extremity edema BREAST: No palpable masses or nodules in either right or left breasts. No palpable axillary supraclavicular or infraclavicular adenopathy no breast tenderness or nipple discharge. (exam performed in the presence of a chaperone)  LABORATORY DATA:  I have reviewed the data as listed   Chemistry      Component Value Date/Time   NA 140 08/23/2016 1201   K 3.9 08/23/2016 1201   CL 107 08/14/2016 1605   CO2 26 08/23/2016 1201   BUN 16.6 08/23/2016 1201   CREATININE 0.8 08/23/2016 1201      Component Value Date/Time   CALCIUM 9.2 08/23/2016 1201   ALKPHOS 84 08/23/2016 1201   AST 21 08/23/2016 1201   ALT 26 08/23/2016 1201   BILITOT 0.84 08/23/2016 1201       Lab Results  Component Value Date   WBC 8.7 09/08/2016   HGB 11.6 09/08/2016   HCT 33.8 (L) 09/08/2016   MCV 90.9 09/08/2016   PLT 240 09/08/2016   NEUTROABS 6.2 09/08/2016    ASSESSMENT & PLAN:  Breast cancer of upper-outer quadrant of right female breast ([place]) Rt Biopsy 11oclock and 2 o clock: Grade 3 IDC with LVI; LN biopsy: Positive IDC; 11oclock:  Er 100%, PR 90%, Ki 67: 90%; Her 2 Neg Heterogeneous; LN: Er 100%, PR: 90%; Ki67: 60%; Her 2 Pos Ratio 2.37; 2oclock: Er 100%, PR 90%; Ki 67: 90%; Her 2 Positive 2.52  Rt Breast 2 masses at 11-12:30 Position spanning 8 cm; Additional hihly susp mass at 2 o clock: 1.5 cm; LN with cort thickening 2.7 cm Breast MRI on 08/13/2016: 8 cm span of breast cancer with multiple enlarged axillary lymph nodes. CT CAP: 08/17/16: 4 mm right middle lobe pulmonary nodule unlikely to be metastatic disease Bone scan 08/17/2016: No evidence of metastatic disease  Recommendationbased on multidisciplinary tumor board: 1. Neoadjuvant chemotherapy with TCH Perjeta 6 cycles followed by Herceptin maintenance for 1 year 2. Followed by breast conserving surgery if possible with sentinel lymph node study 3. Followed by adjuvant radiation therapy 4. Followed by Anti-estrogen therapy -------------------------------------------------------------------------------------------------------------------------------------------------------- Current treatment: Cycle 2 of neoadjuvant TCH Perjeta Chemotherapy toxicities:  1. Nausea grade 1: Patient with around-the-clock Zofran and Compazine and this helped her. 2. Fatigue 3. Loss of taste 4. Chemotherapy-induced neutropenia: ANC today is 1.6  area will keep the dose at the same for the next treatment. 5. Constipation alternating with diarrhea: Constipation due to nausea medications and diarrhea due to Perjeta. 6. Rectal infection treated with antibiotics Return to clinic in 3 weeks for cycle 3  I spent 25 minutes talking to the patient of which more than half was spent in counseling and coordination of care.  No orders of the defined types were placed in this encounter.  The patient has a good understanding of the overall plan. she agrees with it. she will call with any problems that may develop before the next visit here.   Rulon Eisenmenger, MD 09/08/16

## 2016-09-08 NOTE — Patient Instructions (Signed)
[facility] Discharge Instructions for Patients Receiving Chemotherapy  Today you received the following chemotherapy agents Herceptin, Perjeta, Taxotere and Carboplatin   To help prevent nausea and vomiting after your treatment, we encourage you to take your nausea medication as directed. No Zofran for 3 days. Take Compazine instead.    If you develop nausea and vomiting that is not controlled by your nausea medication, call the clinic.   BELOW ARE SYMPTOMS THAT SHOULD BE REPORTED IMMEDIATELY:  *FEVER GREATER THAN 100.5 F  *CHILLS WITH OR WITHOUT FEVER  NAUSEA AND VOMITING THAT IS NOT CONTROLLED WITH YOUR NAUSEA MEDICATION  *UNUSUAL SHORTNESS OF BREATH  *UNUSUAL BRUISING OR BLEEDING  TENDERNESS IN MOUTH AND THROAT WITH OR WITHOUT PRESENCE OF ULCERS  *URINARY PROBLEMS  *BOWEL PROBLEMS  UNUSUAL RASH Items with * indicate a potential emergency and should be followed up as soon as possible.  Feel free to call the clinic you have any questions or concerns. The clinic phone number is [phone].  Please show the [facility] at check-in to the Emergency Department and triage nurse.  Trastuzumab injection for infusion What is this medicine? TRASTUZUMAB (tras TOO zoo mab) is a monoclonal antibody. It is used to treat breast cancer and stomach cancer. This medicine may be used for other purposes; ask your health care provider or pharmacist if you have questions. COMMON BRAND NAME(S): Herceptin What should I tell my health care provider before I take this medicine? They need to know if you have any of these conditions: -heart disease -heart failure -infection (especially a virus infection such as chickenpox, cold sores, or herpes) -lung or breathing disease, like asthma -recent or ongoing radiation therapy -an unusual or allergic reaction to trastuzumab, benzyl alcohol, or other medications, foods, dyes, or preservatives -pregnant or trying to get  pregnant -breast-feeding How should I use this medicine? This drug is given as an infusion into a vein. It is administered in a hospital or clinic by a specially trained health care professional. Talk to your pediatrician regarding the use of this medicine in children. This medicine is not approved for use in children. Overdosage: If you think you have taken too much of this medicine contact a poison control center or emergency room at once. NOTE: This medicine is only for you. Do not share this medicine with others. What if I miss a dose? It is important not to miss a dose. Call your doctor or health care professional if you are unable to keep an appointment. What may interact with this medicine? -doxorubicin -warfarin This list may not describe all possible interactions. Give your health care provider a list of all the medicines, herbs, non-prescription drugs, or dietary supplements you use. Also tell them if you smoke, drink alcohol, or use illegal drugs. Some items may interact with your medicine. What should I watch for while using this medicine? Visit your doctor for checks on your progress. Report any side effects. Continue your course of treatment even though you feel ill unless your doctor tells you to stop. Call your doctor or health care professional for advice if you get a fever, chills or sore throat, or other symptoms of a cold or flu. Do not treat yourself. Try to avoid being around people who are sick. You may experience fever, chills and shaking during your first infusion. These effects are usually mild and can be treated with other medicines. Report any side effects during the infusion to your health care professional. Fever and  chills usually do not happen with later infusions. Do not become pregnant while taking this medicine or for 7 months after stopping it. Women should inform their doctor if they wish to become pregnant or think they might be pregnant. Women of child-bearing  potential will need to have a negative pregnancy test before starting this medicine. There is a potential for serious side effects to an unborn child. Talk to your health care professional or pharmacist for more information. Do not breast-feed an infant while taking this medicine or for 7 months after stopping it. Women must use effective birth control with this medicine. What side effects may I notice from receiving this medicine? Side effects that you should report to your doctor or health care professional as soon as possible: -breathing difficulties -chest pain or palpitations -cough -dizziness or fainting -fever or chills, sore throat -skin rash, itching or hives -swelling of the legs or ankles -unusually weak or tired Side effects that usually do not require medical attention (report to your doctor or health care professional if they continue or are bothersome): -loss of appetite -headache -muscle aches -nausea This list may not describe all possible side effects. Call your doctor for medical advice about side effects. You may report side effects to FDA at 1-800-FDA-1088. Where should I keep my medicine? This drug is given in a hospital or clinic and will not be stored at home. NOTE: This sheet is a summary. It may not cover all possible information. If you have questions about this medicine, talk to your doctor, pharmacist, or health care provider.  2017 Elsevier/Gold Standard (2015-08-25 17:16:44)  Pembrolizumab injection What is this medicine? PEMBROLIZUMAB (pem broe liz ue mab) is a monoclonal antibody. It is used to treat melanoma, head and neck cancer, Hodgkin lymphoma, and non-small cell lung cancer. COMMON BRAND NAME(S): Keytruda What should I tell my health care provider before I take this medicine? They need to know if you have any of these conditions: -diabetes -immune system problems -inflammatory bowel disease -liver disease -lung or breathing  disease -lupus -organ transplant -an unusual or allergic reaction to pembrolizumab, other medicines, foods, dyes, or preservatives -pregnant or trying to get pregnant -breast-feeding How should I use this medicine? This medicine is for infusion into a vein. It is given by a health care professional in a hospital or clinic setting. A special MedGuide will be given to you before each treatment. Be sure to read this information carefully each time. Talk to your pediatrician regarding the use of this medicine in children. While this drug may be prescribed for selected conditions, precautions do apply. What if I miss a dose? It is important not to miss your dose. Call your doctor or health care professional if you are unable to keep an appointment. What may interact with this medicine? Interactions have not been studied. Give your health care provider a list of all the medicines, herbs, non-prescription drugs, or dietary supplements you use. Also tell them if you smoke, drink alcohol, or use illegal drugs. Some items may interact with your medicine. What should I watch for while using this medicine? Your condition will be monitored carefully while you are receiving this medicine. You may need blood work done while you are taking this medicine. Do not become pregnant while taking this medicine or for 4 months after stopping it. Women should inform their doctor if they wish to become pregnant or think they might be pregnant. There is a potential for serious side effects to an  unborn child. Talk to your health care professional or pharmacist for more information. Do not breast-feed an infant while taking this medicine or for 4 months after the last dose. What side effects may I notice from receiving this medicine? Side effects that you should report to your doctor or health care professional as soon as possible: -allergic reactions like skin rash, itching or hives, swelling of the face, lips, or  tongue -bloody or black, tarry -breathing problems -changes in vision -chest pain -chills -constipation -cough -dizziness or feeling faint or lightheaded -fast or irregular heartbeat -fever -flushing -hair loss -low blood counts - this medicine may decrease the number of white blood cells, red blood cells and platelets. You may be at increased risk for infections and bleeding. -muscle pain -muscle weakness -persistent headache -signs and symptoms of high blood sugar such as dizziness; dry mouth; dry skin; fruity breath; nausea; stomach pain; increased hunger or thirst; increased urination -signs and symptoms of kidney injury like trouble passing urine or change in the amount of urine -signs and symptoms of liver injury like dark urine, light-colored stools, loss of appetite, nausea, right upper belly pain, yellowing of the eyes or skin -stomach pain -sweating -weight loss Side effects that usually do not require medical attention (report to your doctor or health care professional if they continue or are bothersome): -decreased appetite -diarrhea -tiredness Where should I keep my medicine? This drug is given in a hospital or clinic and will not be stored at home.  2017 Elsevier/Gold Standard (2015-10-25 19:28:44)  Docetaxel injection What is this medicine? DOCETAXEL (doe se TAX el) is a chemotherapy drug. It targets fast dividing cells, like cancer cells, and causes these cells to die. This medicine is used to treat many types of cancers like breast cancer, certain stomach cancers, head and neck cancer, lung cancer, and prostate cancer. This medicine may be used for other purposes; ask your health care provider or pharmacist if you have questions. COMMON BRAND NAME(S): Docefrez, Taxotere What should I tell my health care provider before I take this medicine? They need to know if you have any of these conditions: -infection (especially a virus infection such as chickenpox, cold  sores, or herpes) -liver disease -low blood counts, like low white cell, platelet, or red cell counts -an unusual or allergic reaction to docetaxel, polysorbate 80, other chemotherapy agents, other medicines, foods, dyes, or preservatives -pregnant or trying to get pregnant -breast-feeding How should I use this medicine? This drug is given as an infusion into a vein. It is administered in a hospital or clinic by a specially trained health care professional. Talk to your pediatrician regarding the use of this medicine in children. Special care may be needed. Overdosage: If you think you have taken too much of this medicine contact a poison control center or emergency room at once. NOTE: This medicine is only for you. Do not share this medicine with others. What if I miss a dose? It is important not to miss your dose. Call your doctor or health care professional if you are unable to keep an appointment. What may interact with this medicine? -cyclosporine -erythromycin -ketoconazole -medicines to increase blood counts like filgrastim, pegfilgrastim, sargramostim -vaccines Talk to your doctor or health care professional before taking any of these medicines: -acetaminophen -aspirin -ibuprofen -ketoprofen -naproxen This list may not describe all possible interactions. Give your health care provider a list of all the medicines, herbs, non-prescription drugs, or dietary supplements you use. Also tell them if  you smoke, drink alcohol, or use illegal drugs. Some items may interact with your medicine. What should I watch for while using this medicine? Your condition will be monitored carefully while you are receiving this medicine. You will need important blood work done while you are taking this medicine. This drug may make you feel generally unwell. This is not uncommon, as chemotherapy can affect healthy cells as well as cancer cells. Report any side effects. Continue your course of treatment  even though you feel ill unless your doctor tells you to stop. In some cases, you may be given additional medicines to help with side effects. Follow all directions for their use. Call your doctor or health care professional for advice if you get a fever, chills or sore throat, or other symptoms of a cold or flu. Do not treat yourself. This drug decreases your body's ability to fight infections. Try to avoid being around people who are sick. This medicine may increase your risk to bruise or bleed. Call your doctor or health care professional if you notice any unusual bleeding. This medicine may contain alcohol in the product. You may get drowsy or dizzy. Do not drive, use machinery, or do anything that needs mental alertness until you know how this medicine affects you. Do not stand or sit up quickly, especially if you are an older patient. This reduces the risk of dizzy or fainting spells. Avoid alcoholic drinks. Do not become pregnant while taking this medicine. Women should inform their doctor if they wish to become pregnant or think they might be pregnant. There is a potential for serious side effects to an unborn child. Talk to your health care professional or pharmacist for more information. Do not breast-feed an infant while taking this medicine. What side effects may I notice from receiving this medicine? Side effects that you should report to your doctor or health care professional as soon as possible: -allergic reactions like skin rash, itching or hives, swelling of the face, lips, or tongue -low blood counts - This drug may decrease the number of white blood cells, red blood cells and platelets. You may be at increased risk for infections and bleeding. -signs of infection - fever or chills, cough, sore throat, pain or difficulty passing urine -signs of decreased platelets or bleeding - bruising, pinpoint red spots on the skin, black, tarry stools, nosebleeds -signs of decreased red blood cells  - unusually weak or tired, fainting spells, lightheadedness -breathing problems -fast or irregular heartbeat -low blood pressure -mouth sores -nausea and vomiting -pain, swelling, redness or irritation at the injection site -pain, tingling, numbness in the hands or feet -swelling of the ankle, feet, hands -weight gain Side effects that usually do not require medical attention (report to your doctor or health care professional if they continue or are bothersome): -bone pain -complete hair loss including hair on your head, underarms, pubic hair, eyebrows, and eyelashes -diarrhea -excessive tearing -changes in the color of fingernails -loosening of the fingernails -nausea -muscle pain -red flush to skin -sweating -weak or tired This list may not describe all possible side effects. Call your doctor for medical advice about side effects. You may report side effects to FDA at 1-800-FDA-1088. Where should I keep my medicine? This drug is given in a hospital or clinic and will not be stored at home. NOTE: This sheet is a summary. It may not cover all possible information. If you have questions about this medicine, talk to your doctor, pharmacist, or health care  provider.  2017 Elsevier/Gold Standard (2015-08-26 12:32:56)  Carboplatin injection What is this medicine? CARBOPLATIN (KAR boe pla tin) is a chemotherapy drug. It targets fast dividing cells, like cancer cells, and causes these cells to die. This medicine is used to treat ovarian cancer and many other cancers. This medicine may be used for other purposes; ask your health care provider or pharmacist if you have questions. COMMON BRAND NAME(S): Paraplatin What should I tell my health care provider before I take this medicine? They need to know if you have any of these conditions: -blood disorders -hearing problems -kidney disease -recent or ongoing radiation therapy -an unusual or allergic reaction to carboplatin, cisplatin,  other chemotherapy, other medicines, foods, dyes, or preservatives -pregnant or trying to get pregnant -breast-feeding How should I use this medicine? This drug is usually given as an infusion into a vein. It is administered in a hospital or clinic by a specially trained health care professional. Talk to your pediatrician regarding the use of this medicine in children. Special care may be needed. Overdosage: If you think you have taken too much of this medicine contact a poison control center or emergency room at once. NOTE: This medicine is only for you. Do not share this medicine with others. What if I miss a dose? It is important not to miss a dose. Call your doctor or health care professional if you are unable to keep an appointment. What may interact with this medicine? -medicines for seizures -medicines to increase blood counts like filgrastim, pegfilgrastim, sargramostim -some antibiotics like amikacin, gentamicin, neomycin, streptomycin, tobramycin -vaccines Talk to your doctor or health care professional before taking any of these medicines: -acetaminophen -aspirin -ibuprofen -ketoprofen -naproxen This list may not describe all possible interactions. Give your health care provider a list of all the medicines, herbs, non-prescription drugs, or dietary supplements you use. Also tell them if you smoke, drink alcohol, or use illegal drugs. Some items may interact with your medicine. What should I watch for while using this medicine? Your condition will be monitored carefully while you are receiving this medicine. You will need important blood work done while you are taking this medicine. This drug may make you feel generally unwell. This is not uncommon, as chemotherapy can affect healthy cells as well as cancer cells. Report any side effects. Continue your course of treatment even though you feel ill unless your doctor tells you to stop. In some cases, you may be given additional  medicines to help with side effects. Follow all directions for their use. Call your doctor or health care professional for advice if you get a fever, chills or sore throat, or other symptoms of a cold or flu. Do not treat yourself. This drug decreases your body's ability to fight infections. Try to avoid being around people who are sick. This medicine may increase your risk to bruise or bleed. Call your doctor or health care professional if you notice any unusual bleeding. Be careful brushing and flossing your teeth or using a toothpick because you may get an infection or bleed more easily. If you have any dental work done, tell your dentist you are receiving this medicine. Avoid taking products that contain aspirin, acetaminophen, ibuprofen, naproxen, or ketoprofen unless instructed by your doctor. These medicines may hide a fever. Do not become pregnant while taking this medicine. Women should inform their doctor if they wish to become pregnant or think they might be pregnant. There is a potential for serious side effects to an  unborn child. Talk to your health care professional or pharmacist for more information. Do not breast-feed an infant while taking this medicine. What side effects may I notice from receiving this medicine? Side effects that you should report to your doctor or health care professional as soon as possible: -allergic reactions like skin rash, itching or hives, swelling of the face, lips, or tongue -signs of infection - fever or chills, cough, sore throat, pain or difficulty passing urine -signs of decreased platelets or bleeding - bruising, pinpoint red spots on the skin, black, tarry stools, nosebleeds -signs of decreased red blood cells - unusually weak or tired, fainting spells, lightheadedness -breathing problems -changes in hearing -changes in vision -chest pain -high blood pressure -low blood counts - This drug may decrease the number of white blood cells, red blood  cells and platelets. You may be at increased risk for infections and bleeding. -nausea and vomiting -pain, swelling, redness or irritation at the injection site -pain, tingling, numbness in the hands or feet -problems with balance, talking, walking -trouble passing urine or change in the amount of urine Side effects that usually do not require medical attention (report to your doctor or health care professional if they continue or are bothersome): -hair loss -loss of appetite -metallic taste in the mouth or changes in taste This list may not describe all possible side effects. Call your doctor for medical advice about side effects. You may report side effects to FDA at 1-800-FDA-1088. Where should I keep my medicine? This drug is given in a hospital or clinic and will not be stored at home. NOTE: This sheet is a summary. It may not cover all possible information. If you have questions about this medicine, talk to your doctor, pharmacist, or health care provider.  2017 Elsevier/Gold Standard (2007-10-29 14:38:05)

## 2016-09-12 ENCOUNTER — Telehealth: Payer: Self-pay | Admitting: Pediatrics

## 2016-09-12 NOTE — Telephone Encounter (Signed)
Patient called stating that she has started a antibiotic and was prescribed diflucan.  Patient states that she now has symptoms of a yeast infection and would like to know if ok to start diflucan.   Patient aware she may start diflucan

## 2016-09-18 ENCOUNTER — Ambulatory Visit (HOSPITAL_BASED_OUTPATIENT_CLINIC_OR_DEPARTMENT_OTHER): Payer: BLUE CROSS/BLUE SHIELD | Admitting: Nurse Practitioner

## 2016-09-18 ENCOUNTER — Telehealth: Payer: Self-pay

## 2016-09-18 VITALS — BP 146/90 | HR 86 | Temp 98.4°F | Resp 17 | Ht 67.0 in | Wt 171.7 lb

## 2016-09-18 DIAGNOSIS — Z95828 Presence of other vascular implants and grafts: Secondary | ICD-10-CM

## 2016-09-18 DIAGNOSIS — C50411 Malignant neoplasm of upper-outer quadrant of right female breast: Secondary | ICD-10-CM

## 2016-09-18 DIAGNOSIS — Z170 Estrogen receptor positive status [ER+]: Secondary | ICD-10-CM

## 2016-09-18 DIAGNOSIS — L271 Localized skin eruption due to drugs and medicaments taken internally: Secondary | ICD-10-CM

## 2016-09-18 DIAGNOSIS — E039 Hypothyroidism, unspecified: Secondary | ICD-10-CM | POA: Diagnosis not present

## 2016-09-18 NOTE — Telephone Encounter (Signed)
Pt called w/ c/o hands from wrists to fingers are red and swollen. Started yesterday. She did finish antibiotic augmentin yesterday for urinary infection. She did have one incontinent diarrhea today that stopped with immodium.Nelida Gores, carbo, herceptin, perjeta cycle 2 09/08/16 Next 09/29/16 S/w Ivan Croft NP and had pt come for evaluation. inbasket sent.

## 2016-09-18 NOTE — Progress Notes (Signed)
SYMPTOM MANAGEMENT CLINIC    Chief Complaint: red stocking rash on hands, and nails cracking; some redness on face as well  HPI:  Cynthia Chavez 61 y.o. female diagnosed with breast cancer who is receiving Docetaxel/ Carboplatin with Herceptin. She last received chemotherapy on 09/08/2016 and is due to receive it again next Friday on 09/29/2016. She presents today due to a red rash which covers the palms of her hands and the back of her hands and is now on her face as well. It is painful and she has some puffiness of her hands. Upon examination she did have some redness beginning on her feet as well. She denies any other issues.     Breast cancer of upper-outer quadrant of right female breast ([place])   07/24/2016 Mammogram    Rt Breast 2 masses at 11-12:30 Position spanning 8 cm; Additional hihly susp mass at 2 o clock: 1.5 cm; LN with cort thickening 2.7 cm      07/27/2016 Initial Diagnosis    Rt Biopsy 11oclock and 2 o clock: Grade 3 IDC with LVI; LN biopsy: Positive IDC; 11oclock: Er 100%, PR 90%, Ki 67: 90%; Her 2 Neg Heterogeneous; LN: Er 100%, PR: 90%; Ki67: 60%; Her 2 Pos Ratio 2.37; 2oclock: Er 100%, PR 90%; Ki 67: 90%; Her 2 Positive 2.52      08/16/2016 -  Neo-Adjuvant Chemotherapy    Neoadjuvant chemotherapy with TCH Perjeta       ROS as noted in HPI  Past Medical History:  Diagnosis Date  . Anxiety   . Edema    bilateral feet and leg swelling  . Hypothyroidism   . Malaise   . Thyroid disease   . Varicose veins     Past Surgical History:  Procedure Laterality Date  . ABDOMINAL HYSTERECTOMY    . CHOLECYSTECTOMY    . ESOPHAGEAL DILATION    . PORTACATH PLACEMENT Left 08/17/2016   Procedure: INSERTION PORT-A-CATH WITH [place];  Surgeon: Rolm Bookbinder, MD;  Location: [facility];  Service: General;  Laterality: Left;    has Hypothyroidism; Varicose veins of lower extremities with other complications; Breast cancer of upper-outer quadrant of right female  breast ([place]); Malignant neoplasm of upper-outer quadrant of right breast in female, estrogen receptor positive ([place]); and Port catheter in place on her problem list.    is allergic to ivp dye [iodinated diagnostic agents]; other; dilaudid [hydromorphone hcl]; asa [aspirin]; and codeine.  Allergies as of 09/18/2016      Reactions   Ivp Dye [iodinated Diagnostic Agents] Other (See Comments)   Chest pain   Other Shortness Of Breath, Swelling, Other (See Comments)   Versed or Fentanyl when used for endoscopy caused throat and tongue swelling   Dilaudid [hydromorphone Hcl]    PT had CP and felt shaky. Didn't like the feeling, but sx lessened w/in 10 min of IV administration   Asa [aspirin] Other (See Comments)   spasms   Codeine Other (See Comments)   spasms      Medication List       Accurate as of 09/18/16  4:31 PM. Always use your most recent med list.          amoxicillin-clavulanate 875-125 MG tablet Commonly known as:  AUGMENTIN Take 1 tablet by mouth 2 (two) times daily.   aspirin EC 81 MG tablet Take 81 mg by mouth daily.   dexamethasone 4 MG tablet Commonly known as:  DECADRON Take 1 tablet (4 mg total) by mouth  daily. Take 1 tab day before and 1 tab day after chemo   hydrocortisone 25 MG suppository Commonly known as:  ANUSOL-HC Place 1 suppository (25 mg total) rectally 2 (two) times daily.   levothyroxine 125 MCG tablet Commonly known as:  SYNTHROID, LEVOTHROID Take 1 tablet (125 mcg total) by mouth daily before breakfast.   lidocaine-prilocaine cream Commonly known as:  EMLA Apply to affected area once   LORazepam 0.5 MG tablet Commonly known as:  ATIVAN Take 1 tablet (0.5 mg total) by mouth at bedtime. As needed for sleep   neomycin-polymyxin-hydrocortisone otic solution Commonly known as:  CORTISPORIN Place 4 drops into the left ear 4 (four) times daily.   ondansetron 8 MG tablet Commonly known as:  ZOFRAN Take 1 tablet (8 mg total) by mouth 2 (two)  times daily as needed for refractory nausea / vomiting. Start on day 3 after chemo.   prochlorperazine 10 MG tablet Commonly known as:  COMPAZINE Take 1 tablet (10 mg total) by mouth every 6 (six) hours as needed (Nausea or vomiting).        PHYSICAL EXAMINATION    Oncology Vitals 09/18/2016 09/08/2016  Height 170 cm 170 cm  Weight 77.883 kg 79.878 kg  Weight (lbs) 171 lbs 11 oz 176 lbs 2 oz  BMI (kg/m2) 26.89 kg/m2 27.58 kg/m2  Temp 98.4 97.5  Pulse 86 72  Resp 17 18  SpO2 97 99  BSA (m2) 1.92 m2 1.94 m2   BP Readings from Last 2 Encounters:  09/18/16 (!) 146/90  09/08/16 135/76    Physical Exam  Constitutional: She is oriented to person, place, and time and well-developed, well-nourished, and in no distress.  Eyes: EOM are normal.  Neck: Normal range of motion.  Neurological: She is alert and oriented to person, place, and time.  Skin: Rash noted. There is erythema.  See accompanying pictures of skin on hands and face. She does have cracked nails and tenderness to fingertips, she also has some redness beginning on her feet as well. No rash or redness noted to trunk or axilla or elsewhere on the body  LABORATORY DATA:. No visits with results within 3 Day(s) from this visit.  Latest known visit with results is:  Appointment on 09/08/2016  Component Date Value Ref Range Status  . WBC 09/08/2016 8.7  3.9 - 10.3 10e3/uL Final  . NEUT# 09/08/2016 6.2  1.5 - 6.5 10e3/uL Final  . HGB 09/08/2016 11.6  11.6 - 15.9 g/dL Final  . HCT 09/08/2016 33.8* 34.8 - 46.6 % Final  . Platelets 09/08/2016 240  145 - 400 10e3/uL Final  . MCV 09/08/2016 90.9  79.5 - 101.0 fL Final  . MCH 09/08/2016 31.2  25.1 - 34.0 pg Final  . MCHC 09/08/2016 34.3  31.5 - 36.0 g/dL Final  . RBC 09/08/2016 3.72  3.70 - 5.45 10e6/uL Final  . RDW 09/08/2016 14.0  11.2 - 14.5 % Final  . lymph# 09/08/2016 1.5  0.9 - 3.3 10e3/uL Final  . MONO# 09/08/2016 0.9  0.1 - 0.9 10e3/uL Final  . Eosinophils Absolute  09/08/2016 0.1  0.0 - 0.5 10e3/uL Final  . Basophils Absolute 09/08/2016 0.0  0.0 - 0.1 10e3/uL Final  . NEUT% 09/08/2016 71.2  38.4 - 76.8 % Final  . LYMPH% 09/08/2016 16.8  14.0 - 49.7 % Final  . MONO% 09/08/2016 10.4  0.0 - 14.0 % Final  . EOS% 09/08/2016 1.5  0.0 - 7.0 % Final  . BASO% 09/08/2016 0.1  0.0 -  2.0 % Final  . Sodium 09/08/2016 144  136 - 145 mEq/L Final  . Potassium 09/08/2016 3.6  3.5 - 5.1 mEq/L Final  . Chloride 09/08/2016 110* 98 - 109 mEq/L Final  . CO2 09/08/2016 26  22 - 29 mEq/L Final  . Glucose 09/08/2016 96  70 - 140 mg/dl Final  . BUN 09/08/2016 14.3  7.0 - 26.0 mg/dL Final  . Creatinine 09/08/2016 0.6  0.6 - 1.1 mg/dL Final  . Total Bilirubin 09/08/2016 0.40  0.20 - 1.20 mg/dL Final  . Alkaline Phosphatase 09/08/2016 82  40 - 150 U/L Final  . AST 09/08/2016 12  5 - 34 U/L Final  . ALT 09/08/2016 17  0 - 55 U/L Final  . Total Protein 09/08/2016 6.2* 6.4 - 8.3 g/dL Final  . Albumin 09/08/2016 3.4* 3.5 - 5.0 g/dL Final  . Calcium 09/08/2016 9.0  8.4 - 10.4 mg/dL Final  . Anion Gap 09/08/2016 8  3 - 11 mEq/L Final  . EGFR 09/08/2016 >90  >90 ml/min/1.73 m2 Final    RADIOGRAPHIC STUDIES: No results found.  ASSESSMENT/PLAN:    Hand/foot syndrome rash - rash, likely due to chemo, noted on hands, face and slightly to feet. Instructed patient to use lotions liberally and avoid heat; to take Tylenol for pain  Patient stated understanding of Chavez instructions; and was in agreement with this plan of care. The patient knows to call the clinic with any problems, questions or concerns. Dr. Julieanne Manson looked at the rash with me and confirmed this is consistent with hand/foot syndrome and is likely NOT due to the antibiotic she just finished taking.   Total time spent with patient was 30 minutes;  with greater than  50 percent of that time spent in face to face counseling regarding patient's symptoms,  and coordination of care and follow up.  Disclaimer:This dictation  was prepared with Dragon/digital dictation along with [employer]. Any transcriptional errors that result from this process are unintentional.  Bill Salinas, NP, AOCNP 09/18/2016

## 2016-09-25 ENCOUNTER — Encounter: Payer: Self-pay | Admitting: *Deleted

## 2016-09-25 ENCOUNTER — Ambulatory Visit: Payer: BLUE CROSS/BLUE SHIELD | Admitting: Nurse Practitioner

## 2016-09-25 ENCOUNTER — Other Ambulatory Visit: Payer: Self-pay | Admitting: *Deleted

## 2016-09-25 ENCOUNTER — Telehealth: Payer: Self-pay

## 2016-09-25 ENCOUNTER — Other Ambulatory Visit: Payer: Self-pay | Admitting: Nurse Practitioner

## 2016-09-25 ENCOUNTER — Other Ambulatory Visit (HOSPITAL_COMMUNITY)
Admission: AD | Admit: 2016-09-25 | Discharge: 2016-09-25 | Disposition: A | Discharge status: Home / Self Care | Payer: BLUE CROSS/BLUE SHIELD | Source: Ambulatory Visit | Attending: Hematology and Oncology | Admitting: Hematology and Oncology

## 2016-09-25 ENCOUNTER — Ambulatory Visit (HOSPITAL_BASED_OUTPATIENT_CLINIC_OR_DEPARTMENT_OTHER): Payer: BLUE CROSS/BLUE SHIELD

## 2016-09-25 ENCOUNTER — Telehealth: Payer: Self-pay | Admitting: *Deleted

## 2016-09-25 VITALS — BP 133/69 | HR 88 | Temp 98.7°F | Resp 17 | Ht 67.0 in | Wt 172.0 lb

## 2016-09-25 DIAGNOSIS — C50411 Malignant neoplasm of upper-outer quadrant of right female breast: Secondary | ICD-10-CM

## 2016-09-25 DIAGNOSIS — R197 Diarrhea, unspecified: Secondary | ICD-10-CM

## 2016-09-25 DIAGNOSIS — A09 Infectious gastroenteritis and colitis, unspecified: Secondary | ICD-10-CM

## 2016-09-25 LAB — CBC WITH DIFFERENTIAL/PLATELET
BASO%: 0.1 % (ref 0.0–2.0)
BASOS ABS: 0 10*3/uL (ref 0.0–0.1)
EOS ABS: 0 10*3/uL (ref 0.0–0.5)
EOS%: 0 % (ref 0.0–7.0)
HCT: 34 % — ABNORMAL LOW (ref 34.8–46.6)
HGB: 11.3 g/dL — ABNORMAL LOW (ref 11.6–15.9)
LYMPH%: 4.6 % — AB (ref 14.0–49.7)
MCH: 31.5 pg (ref 25.1–34.0)
MCHC: 33.2 g/dL (ref 31.5–36.0)
MCV: 94.9 fL (ref 79.5–101.0)
MONO#: 0.7 10*3/uL (ref 0.1–0.9)
MONO%: 4.4 % (ref 0.0–14.0)
NEUT#: 14.9 10*3/uL — ABNORMAL HIGH (ref 1.5–6.5)
NEUT%: 90.9 % — ABNORMAL HIGH (ref 38.4–76.8)
PLATELETS: 170 10*3/uL (ref 145–400)
RBC: 3.58 10*6/uL — AB (ref 3.70–5.45)
RDW: 15.6 % — ABNORMAL HIGH (ref 11.2–14.5)
WBC: 16.4 10*3/uL — AB (ref 3.9–10.3)
lymph#: 0.8 10*3/uL — ABNORMAL LOW (ref 0.9–3.3)

## 2016-09-25 LAB — C DIFFICILE QUICK SCREEN W PCR REFLEX
C DIFFICILE (CDIFF) TOXIN: POSITIVE — AB
C DIFFICLE (CDIFF) ANTIGEN: POSITIVE — AB
C Diff interpretation: DETECTED

## 2016-09-25 LAB — COMPREHENSIVE METABOLIC PANEL
ALBUMIN: 3 g/dL — AB (ref 3.5–5.0)
ALK PHOS: 113 U/L (ref 40–150)
ALT: 33 U/L (ref 0–55)
ANION GAP: 9 meq/L (ref 3–11)
AST: 15 U/L (ref 5–34)
BUN: 8.2 mg/dL (ref 7.0–26.0)
CALCIUM: 8.7 mg/dL (ref 8.4–10.4)
CO2: 27 mEq/L (ref 22–29)
CREATININE: 0.7 mg/dL (ref 0.6–1.1)
Chloride: 108 mEq/L (ref 98–109)
EGFR: >90 mL/min/{1.73_m2} (ref >90)
Glucose: 98 mg/dl (ref 70–140)
Potassium: 3.3 mEq/L — ABNORMAL LOW (ref 3.5–5.1)
Sodium: 144 mEq/L (ref 136–145)
Total Bilirubin: 0.47 mg/dL (ref 0.20–1.20)
Total Protein: 5.7 g/dL — ABNORMAL LOW (ref 6.4–8.3)

## 2016-09-25 MED ORDER — VANCOMYCIN HCL 125 MG PO CAPS: 125.0000 mg | ORAL_CAPSULE | Freq: Four times a day (QID) | ORAL | 0 refills | Status: DC

## 2016-09-25 MED ORDER — DIPHENOXYLATE-ATROPINE 2.5-0.025 MG PO TABS: 2.0000 | ORAL_TABLET | Freq: Four times a day (QID) | ORAL | 0 refills | Status: DC | PRN

## 2016-09-25 NOTE — Progress Notes (Signed)
Opened in error

## 2016-09-25 NOTE — Progress Notes (Unsigned)
Critical C. Diff results given to May RN

## 2016-09-25 NOTE — Telephone Encounter (Signed)
Notified Dr.Gudena of positive cdiff results and is aware.

## 2016-09-25 NOTE — Telephone Encounter (Addendum)
Hands are improved. Saturday - every time she would drink or eat she had a small amount of watery diarrhea. Sunday - she went 6-7 times yesterday and 2x in night and this AM. Awful smell, yellowish color. Her stomach is swollen and painful when push on it. Gas pills did not make a difference. No fever, no nausea, no vomiting. Is continuing to eat chicken soup and boost.  She is using imodium total of 8/day. Did not use imodium on Sunday thinking it was not helping. Set up for labs cbc/cmet at 1330 and [facility] at 1400 per pt request. Told her to get here earlier if she can.  Cycle 2 Docetaxel, carbo, herceptin, perjeta last 2/2,  next 2/23

## 2016-09-25 NOTE — Telephone Encounter (Signed)
Pt's stool culture for C. Diff has come back positive.  Burns Spain, NP made aware. A prescription for oral vancomycin has been called in to her  Pharmacy-[facility] in [place]. TCT patient and spoke her. Informed her of test result and of the antibiotic that has been called in to her pharmacy. Pt voiced understanding. Advised to call if no relief from diarrhea over the next few days. Reinforced use of BRAT diet and plenty of oral fluids..Again pt voiced understanding.

## 2016-09-27 ENCOUNTER — Telehealth: Payer: Self-pay

## 2016-09-27 NOTE — Telephone Encounter (Signed)
Pt called with question about ankle swelling. It is bilat with R>L, no redness or heat. Not real tender but do feel tight. D1C2 taxotere/ carbo given 09/08/16.  She has appt 2/23. Instructed her to keep appt and call if they get worse. She is keeping them elevated. Discussed compression socks vs socks for diabetics which she has. She reports her diarrhea is down to TID from 8 times a day.

## 2016-09-29 ENCOUNTER — Ambulatory Visit (HOSPITAL_BASED_OUTPATIENT_CLINIC_OR_DEPARTMENT_OTHER): Payer: BLUE CROSS/BLUE SHIELD | Admitting: Hematology and Oncology

## 2016-09-29 ENCOUNTER — Ambulatory Visit: Payer: BLUE CROSS/BLUE SHIELD

## 2016-09-29 ENCOUNTER — Ambulatory Visit (HOSPITAL_BASED_OUTPATIENT_CLINIC_OR_DEPARTMENT_OTHER): Payer: BLUE CROSS/BLUE SHIELD

## 2016-09-29 ENCOUNTER — Encounter: Payer: Self-pay | Admitting: Hematology and Oncology

## 2016-09-29 ENCOUNTER — Other Ambulatory Visit (HOSPITAL_BASED_OUTPATIENT_CLINIC_OR_DEPARTMENT_OTHER): Payer: BLUE CROSS/BLUE SHIELD

## 2016-09-29 DIAGNOSIS — Z170 Estrogen receptor positive status [ER+]: Secondary | ICD-10-CM | POA: Diagnosis not present

## 2016-09-29 DIAGNOSIS — Z5112 Encounter for antineoplastic immunotherapy: Secondary | ICD-10-CM | POA: Diagnosis not present

## 2016-09-29 DIAGNOSIS — C50411 Malignant neoplasm of upper-outer quadrant of right female breast: Secondary | ICD-10-CM

## 2016-09-29 DIAGNOSIS — Z95828 Presence of other vascular implants and grafts: Secondary | ICD-10-CM

## 2016-09-29 DIAGNOSIS — Z5111 Encounter for antineoplastic chemotherapy: Secondary | ICD-10-CM | POA: Diagnosis not present

## 2016-09-29 DIAGNOSIS — D701 Agranulocytosis secondary to cancer chemotherapy: Secondary | ICD-10-CM

## 2016-09-29 LAB — CBC WITH DIFFERENTIAL/PLATELET
BASO%: 0.4 % (ref 0.0–2.0)
Basophils Absolute: 0 10*3/uL (ref 0.0–0.1)
EOS%: 0.1 % (ref 0.0–7.0)
Eosinophils Absolute: 0 10*3/uL (ref 0.0–0.5)
HCT: 32.2 % — ABNORMAL LOW (ref 34.8–46.6)
HGB: 10.9 g/dL — ABNORMAL LOW (ref 11.6–15.9)
LYMPH%: 9.8 % — AB (ref 14.0–49.7)
MCH: 32.1 pg (ref 25.1–34.0)
MCHC: 33.8 g/dL (ref 31.5–36.0)
MCV: 95 fL (ref 79.5–101.0)
MONO#: 0.8 10*3/uL (ref 0.1–0.9)
MONO%: 7.7 % (ref 0.0–14.0)
NEUT%: 82 % — ABNORMAL HIGH (ref 38.4–76.8)
NEUTROS ABS: 8 10*3/uL — AB (ref 1.5–6.5)
Platelets: 208 10*3/uL (ref 145–400)
RBC: 3.39 10*6/uL — AB (ref 3.70–5.45)
RDW: 15.9 % — ABNORMAL HIGH (ref 11.2–14.5)
WBC: 9.8 10*3/uL (ref 3.9–10.3)
lymph#: 1 10*3/uL (ref 0.9–3.3)

## 2016-09-29 LAB — COMPREHENSIVE METABOLIC PANEL
ALT: 22 U/L (ref 0–55)
ANION GAP: 7 meq/L (ref 3–11)
AST: 17 U/L (ref 5–34)
Albumin: 3 g/dL — ABNORMAL LOW (ref 3.5–5.0)
Alkaline Phosphatase: 102 U/L (ref 40–150)
BILIRUBIN TOTAL: 0.43 mg/dL (ref 0.20–1.20)
BUN: 9.2 mg/dL (ref 7.0–26.0)
CALCIUM: 8.9 mg/dL (ref 8.4–10.4)
CHLORIDE: 106 meq/L (ref 98–109)
CO2: 26 meq/L (ref 22–29)
Creatinine: 0.6 mg/dL (ref 0.6–1.1)
Glucose: 129 mg/dl (ref 70–140)
Potassium: 3.8 mEq/L (ref 3.5–5.1)
Sodium: 140 mEq/L (ref 136–145)
Total Protein: 5.7 g/dL — ABNORMAL LOW (ref 6.4–8.3)

## 2016-09-29 MED ORDER — ACETAMINOPHEN 325 MG PO TABS
ORAL_TABLET | ORAL | Status: AC
  Filled 2016-09-29: qty 2

## 2016-09-29 MED ORDER — PALONOSETRON HCL INJECTION 0.25 MG/5ML
0.2500 mg | Freq: Once | INTRAVENOUS | Status: AC
  Administered 2016-09-29: 0.25 mg via INTRAVENOUS

## 2016-09-29 MED ORDER — SODIUM CHLORIDE 0.9 % IV SOLN
595.5000 mg | Freq: Once | INTRAVENOUS | Status: AC
  Administered 2016-09-29: 600 mg via INTRAVENOUS
  Filled 2016-09-29: qty 60

## 2016-09-29 MED ORDER — SODIUM CHLORIDE 0.9 % IV SOLN
Freq: Once | INTRAVENOUS | Status: AC
  Administered 2016-09-29: 11:00:00 via INTRAVENOUS

## 2016-09-29 MED ORDER — PALONOSETRON HCL INJECTION 0.25 MG/5ML
INTRAVENOUS | Status: AC
  Filled 2016-09-29: qty 5

## 2016-09-29 MED ORDER — SODIUM CHLORIDE 0.9 % IV SOLN
420.0000 mg | Freq: Once | INTRAVENOUS | Status: AC
  Administered 2016-09-29: 420 mg via INTRAVENOUS
  Filled 2016-09-29: qty 14

## 2016-09-29 MED ORDER — FOSAPREPITANT DIMEGLUMINE INJECTION 150 MG
Freq: Once | INTRAVENOUS | Status: AC
  Administered 2016-09-29: 11:00:00 via INTRAVENOUS
  Filled 2016-09-29: qty 5

## 2016-09-29 MED ORDER — HEPARIN SOD (PORK) LOCK FLUSH 100 UNIT/ML IV SOLN
500.0000 [IU] | Freq: Once | INTRAVENOUS | Status: AC | PRN
Start: 2016-09-29 — End: 2016-09-29
  Administered 2016-09-29: 500 [IU]
  Filled 2016-09-29: qty 5

## 2016-09-29 MED ORDER — DIPHENHYDRAMINE HCL 25 MG PO CAPS
50.0000 mg | ORAL_CAPSULE | Freq: Once | ORAL | Status: AC
  Administered 2016-09-29: 50 mg via ORAL

## 2016-09-29 MED ORDER — SODIUM CHLORIDE 0.9% FLUSH
10.0000 mL | INTRAVENOUS | Status: DC | PRN
  Administered 2016-09-29: 10 mL via INTRAVENOUS
  Filled 2016-09-29: qty 10

## 2016-09-29 MED ORDER — TRASTUZUMAB CHEMO 150 MG IV SOLR
6.0000 mg/kg | Freq: Once | INTRAVENOUS | Status: AC
  Administered 2016-09-29: 504 mg via INTRAVENOUS
  Filled 2016-09-29: qty 24

## 2016-09-29 MED ORDER — SODIUM CHLORIDE 0.9% FLUSH
10.0000 mL | INTRAVENOUS | Status: DC | PRN
  Administered 2016-09-29: 10 mL
  Filled 2016-09-29: qty 10

## 2016-09-29 MED ORDER — ACETAMINOPHEN 325 MG PO TABS
650.0000 mg | ORAL_TABLET | Freq: Once | ORAL | Status: AC
  Administered 2016-09-29: 650 mg via ORAL

## 2016-09-29 MED ORDER — DIPHENHYDRAMINE HCL 25 MG PO CAPS
ORAL_CAPSULE | ORAL | Status: AC
  Filled 2016-09-29: qty 2

## 2016-09-29 NOTE — Progress Notes (Signed)
Patient Care Team: [name], MD as PCP - General (Pediatrics)  DIAGNOSIS:  Encounter Diagnosis  Name Primary?  . Malignant neoplasm of upper-outer quadrant of right breast in female, estrogen receptor positive ([place])     SUMMARY OF ONCOLOGIC HISTORY:   Breast cancer of upper-outer quadrant of right female breast ([place])   07/24/2016 Mammogram    Rt Breast 2 masses at 11-12:30 Position spanning 8 cm; Additional mass at 2 o clock: 1.5 cm; LN with cort thickening 2.7 cm      07/27/2016 Initial Diagnosis    Rt Biopsy 11oclock and 2 o clock: Grade 3 IDC with LVI; LN biopsy: Positive IDC; 11oclock: Er 100%, PR 90%, Ki 67: 90%; Her 2 Neg Heterogeneous; LN: Er 100%, PR: 90%; Ki67: 60%; Her 2 Pos Ratio 2.37; 2oclock: Er 100%, PR 90%; Ki 67: 90%; Her 2 Positive 2.52      08/16/2016 -  Neo-Adjuvant Chemotherapy    Neoadjuvant chemotherapy with TCH Perjeta       CHIEF COMPLIANT: Cycle 3 TCH Perjeta  INTERVAL HISTORY: Cynthia Chavez is a 61 year old with above-mentioned history of right breast cancer currently on neoadjuvant chemotherapy and today is cycle 3 of [place]. Patient could not tolerate the last chemotherapy. She had hand-foot syndrome as well as swelling of the legs and face. She also had severe fatigue and weakness and profound diarrhea 10-15 times per day. The diarrhea got better after she started using Lomotil. Her taste is very poor and she is unable to eat regular food.  REVIEW OF SYSTEMS:   Constitutional: Denies fevers, chills or abnormal weight loss Eyes: Denies blurriness of vision Ears, nose, mouth, throat, and face: Denies mucositis or sore throat Respiratory: Denies cough, dyspnea or wheezes Cardiovascular: Denies palpitation, chest discomfort Gastrointestinal: Severe diarrhea Skin: Denies abnormal skin rashes Lymphatics: Denies new lymphadenopathy or easy bruising Neurological:Denies numbness, tingling or new weaknesses Behavioral/Psych: Mood is stable, no  new changes  Extremities: Leg swelling  Breast:  denies any pain or lumps or nodules in either breasts All other systems were reviewed with the patient and are negative.  I have reviewed the past medical history, past surgical history, social history and family history with the patient and they are unchanged from previous note.  ALLERGIES:  is allergic to ivp dye [iodinated diagnostic agents]; other; dilaudid [hydromorphone hcl]; asa [aspirin]; and codeine.  MEDICATIONS:  Current Outpatient Prescriptions  Medication Sig Dispense Refill  . amoxicillin-clavulanate (AUGMENTIN) 875-125 MG tablet Take 1 tablet by mouth 2 (two) times daily. (Patient not taking: Reported on 09/18/2016) 20 tablet 0  . aspirin EC 81 MG tablet Take 81 mg by mouth daily.    Marland Kitchen dexamethasone (DECADRON) 4 MG tablet Take 1 tablet (4 mg total) by mouth daily. Take 1 tab day before and 1 tab day after chemo 15 tablet 0  . diphenoxylate-atropine (LOMOTIL) 2.5-0.025 MG tablet Take 2 tablets by mouth 4 (four) times daily as needed for diarrhea or loose stools. 45 tablet 0  . hydrocortisone (ANUSOL-HC) 25 MG suppository Place 1 suppository (25 mg total) rectally 2 (two) times daily. (Patient not taking: Reported on 09/18/2016) 12 suppository 0  . levothyroxine (SYNTHROID, LEVOTHROID) 125 MCG tablet Take 1 tablet (125 mcg total) by mouth daily before breakfast. 90 tablet 1  . lidocaine-prilocaine (EMLA) cream Apply to affected area once 30 g 3  . loperamide (IMODIUM) 2 MG capsule Take 2 mg by mouth as needed for diarrhea or loose stools.    Marland Kitchen LORazepam (  ATIVAN) 0.5 MG tablet Take 1 tablet (0.5 mg total) by mouth at bedtime. As needed for sleep (Patient not taking: Reported on 09/18/2016) 30 tablet 0  . neomycin-polymyxin-hydrocortisone (CORTISPORIN) otic solution Place 4 drops into the left ear 4 (four) times daily. (Patient not taking: Reported on 09/18/2016) 10 mL 0  . ondansetron (ZOFRAN) 8 MG tablet Take 1 tablet (8 mg total) by  mouth 2 (two) times daily as needed for refractory nausea / vomiting. Start on day 3 after chemo. 30 tablet 1  . prochlorperazine (COMPAZINE) 10 MG tablet Take 1 tablet (10 mg total) by mouth every 6 (six) hours as needed (Nausea or vomiting). 30 tablet 1  . vancomycin (VANCOCIN) 125 MG capsule Take 1 capsule (125 mg total) by mouth 4 (four) times daily. 40 capsule 0   No current facility-administered medications for this visit.    Facility-Administered Medications Ordered in Other Visits  Medication Dose Route Frequency Provider Last Rate Last Dose  . sodium chloride flush (NS) 0.9 % injection 10 mL  10 mL Intravenous PRN Nicholas Lose, MD   10 mL at 09/29/16 0845    PHYSICAL EXAMINATION: ECOG PERFORMANCE STATUS: 1 - Symptomatic but completely ambulatory  Vitals:   09/29/16 0854  BP: 124/71  Pulse: 82  Resp: 16  Temp: 97.8 F (36.6 C)   Filed Weights   09/29/16 0854  Weight: 169 lb 1.6 oz (76.7 kg)    GENERAL:alert, no distress and comfortable SKIN: skin color, texture, turgor are normal, no rashes or significant lesions EYES: normal, Conjunctiva are pink and non-injected, sclera clear OROPHARYNX:no exudate, no erythema and lips, buccal mucosa, and tongue normal  NECK: supple, thyroid normal size, non-tender, without nodularity LYMPH:  no palpable lymphadenopathy in the cervical, axillary or inguinal LUNGS: clear to auscultation and percussion with normal breathing effort HEART: regular rate & rhythm and no murmurs and no lower extremity edema ABDOMEN:abdomen soft, non-tender and normal bowel sounds MUSCULOSKELETAL:no cyanosis of digits and no clubbing  NEURO: alert & oriented x 3 with fluent speech, no focal motor/sensory deficits EXTREMITIES: No lower extremity edema  LABORATORY DATA:  I have reviewed the data as listed   Chemistry      Component Value Date/Time   NA 144 09/25/2016 1332   K 3.3 (L) 09/25/2016 1332   CL 107 08/14/2016 1605   CO2 27 09/25/2016 1332    BUN 8.2 09/25/2016 1332   CREATININE 0.7 09/25/2016 1332      Component Value Date/Time   CALCIUM 8.7 09/25/2016 1332   ALKPHOS 113 09/25/2016 1332   AST 15 09/25/2016 1332   ALT 33 09/25/2016 1332   BILITOT 0.47 09/25/2016 1332       Lab Results  Component Value Date   WBC 9.8 09/29/2016   HGB 10.9 (L) 09/29/2016   HCT 32.2 (L) 09/29/2016   MCV 95.0 09/29/2016   PLT 208 09/29/2016   NEUTROABS 8.0 (H) 09/29/2016    ASSESSMENT & PLAN:  Breast cancer of upper-outer quadrant of right female breast ([place]) Rt Biopsy 11oclock and 2 o clock: Grade 3 IDC with LVI; LN biopsy: Positive IDC; 11oclock: Er 100%, PR 90%, Ki 67: 90%; Her 2 Neg Heterogeneous; LN: Er 100%, PR: 90%; Ki67: 60%; Her 2 Pos Ratio 2.37; 2oclock: Er 100%, PR 90%; Ki 67: 90%; Her 2 Positive 2.52  Rt Breast 2 masses at 11-12:30 Position spanning 8 cm; Additional hihly susp mass at 2 o clock: 1.5 cm; LN with cort thickening 2.7 cm Breast MRI  on 08/13/2016: 8 cm span of breast cancer with multiple enlarged axillary lymph nodes. CT CAP: 08/17/16: 4 mm right middle lobe pulmonary nodule unlikely to be metastatic disease Bone scan 08/17/2016: No evidence of metastatic disease  Recommendationbased on multidisciplinary tumor board: 1. Neoadjuvant chemotherapy with TCH Perjeta 6 cycles followed by Herceptin maintenance for 1 year 2. Followed by breast conserving surgery if possible with sentinel lymph node study 3. Followed by adjuvant radiation therapy 4. Followed by Anti-estrogen therapy -------------------------------------------------------------------------------------------------------------------------------------------------------- Current treatment: Cycle  3 of neoadjuvant TCH Perjeta (Stopped Taxotere with cycle 3) Chemotherapy toxicities:  1. Nausea grade 1: Patient with around-the-clock Zofran and Compazine and this helped her. 2. Fatigue 3. Loss of taste 4. Chemotherapy-induced neutropenia: ANC today is  8000 5. Constipation alternating with diarrhea: Constipation due to nausea medications and diarrhea due to Perjeta. 6. Severe diarrhea: Improved with Imodium 7. 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 We will discontinue Taxotere from cycle 3  Return to clinic in 3 weeks for cycle 4   I spent 25 minutes talking to the patient of which more than half was spent in counseling and coordination of care.  Orders Placed This Encounter  Procedures  . CBC with Differential    Standing Status:   Standing    Number of Occurrences:   10    Standing Expiration Date:   09/29/2017  . Comprehensive metabolic panel    Standing Status:   Standing    Number of Occurrences:   10    Standing Expiration Date:   09/29/2017   The patient has a good understanding of the overall plan. she agrees with it. she will call with any problems that may develop before the next visit here.   Rulon Eisenmenger, MD 09/29/16

## 2016-09-29 NOTE — Patient Instructions (Signed)
[facility] Discharge Instructions for Patients Receiving Chemotherapy  Today you received the following chemotherapy agents Herceptin, Perjeta and Carboplatin   To help prevent nausea and vomiting after your treatment, we encourage you to take your nausea medication as directed. No Zofran for 3 days. Take Compazine instead.    If you develop nausea and vomiting that is not controlled by your nausea medication, call the clinic.   BELOW ARE SYMPTOMS THAT SHOULD BE REPORTED IMMEDIATELY:  *FEVER GREATER THAN 100.5 F  *CHILLS WITH OR WITHOUT FEVER  NAUSEA AND VOMITING THAT IS NOT CONTROLLED WITH YOUR NAUSEA MEDICATION  *UNUSUAL SHORTNESS OF BREATH  *UNUSUAL BRUISING OR BLEEDING  TENDERNESS IN MOUTH AND THROAT WITH OR WITHOUT PRESENCE OF ULCERS  *URINARY PROBLEMS  *BOWEL PROBLEMS  UNUSUAL RASH Items with * indicate a potential emergency and should be followed up as soon as possible.  Feel free to call the clinic you have any questions or concerns. The clinic phone number is [phone].  Please show the CHEMO ALERT CARD at check-in to the Emergency Department and triage nurse.   

## 2016-09-29 NOTE — Assessment & Plan Note (Signed)
Rt Biopsy 11oclock and 2 o clock: Grade 3 IDC with LVI; LN biopsy: Positive IDC; 11oclock: Er 100%, PR 90%, Ki 67: 90%; Her 2 Neg Heterogeneous; LN: Er 100%, PR: 90%; Ki67: 60%; Her 2 Pos Ratio 2.37; 2oclock: Er 100%, PR 90%; Ki 67: 90%; Her 2 Positive 2.52  Rt Breast 2 masses at 11-12:30 Position spanning 8 cm; Additional hihly susp mass at 2 o clock: 1.5 cm; LN with cort thickening 2.7 cm Breast MRI on 08/13/2016: 8 cm span of breast cancer with multiple enlarged axillary lymph nodes. CT CAP: 08/17/16: 4 mm right middle lobe pulmonary nodule unlikely to be metastatic disease Bone scan 08/17/2016: No evidence of metastatic disease  Recommendationbased on multidisciplinary tumor board: 1. Neoadjuvant chemotherapy with TCH Perjeta 6 cycles followed by Herceptin maintenance for 1 year 2. Followed by breast conserving surgery if possible with sentinel lymph node study 3. Followed by adjuvant radiation therapy 4. Followed by Anti-estrogen therapy -------------------------------------------------------------------------------------------------------------------------------------------------------- Current treatment: Cycle 3 of neoadjuvant TCH Perjeta Chemotherapy toxicities:  1. Nausea grade 1: Patient with around-the-clock Zofran and Compazine and this helped her. 2. Fatigue 3. Loss of taste 4. Chemotherapy-induced neutropenia: ANC today is  5. Constipation alternating with diarrhea: Constipation due to nausea medications and diarrhea due to Perjeta.  Return to clinic in 3 weeks for cycle 4

## 2016-10-18 ENCOUNTER — Telehealth: Payer: Self-pay | Admitting: *Deleted

## 2016-10-18 NOTE — Telephone Encounter (Signed)
"  I received a letter in the mail from  [employer] [facility] to order the On-Pro.  I am not on this anymore since the Taxotere has been stopped.  I also do not want to give this to myself."   Patient read what she received which is the order form for the disposal kit for the On-Pro device when removed from her arm.  "Is it not okay to throw these in th trash?"  No, bag it to return to [facility] or use the kit.  No further questions.

## 2016-10-20 ENCOUNTER — Encounter: Payer: Self-pay | Admitting: *Deleted

## 2016-10-20 ENCOUNTER — Other Ambulatory Visit: Payer: Self-pay | Admitting: Emergency Medicine

## 2016-10-20 ENCOUNTER — Encounter: Payer: Self-pay | Admitting: Hematology and Oncology

## 2016-10-20 ENCOUNTER — Ambulatory Visit: Payer: BLUE CROSS/BLUE SHIELD

## 2016-10-20 ENCOUNTER — Other Ambulatory Visit (HOSPITAL_BASED_OUTPATIENT_CLINIC_OR_DEPARTMENT_OTHER): Payer: BLUE CROSS/BLUE SHIELD

## 2016-10-20 ENCOUNTER — Ambulatory Visit (HOSPITAL_BASED_OUTPATIENT_CLINIC_OR_DEPARTMENT_OTHER): Payer: BLUE CROSS/BLUE SHIELD | Admitting: Hematology and Oncology

## 2016-10-20 ENCOUNTER — Ambulatory Visit (HOSPITAL_BASED_OUTPATIENT_CLINIC_OR_DEPARTMENT_OTHER): Payer: BLUE CROSS/BLUE SHIELD

## 2016-10-20 ENCOUNTER — Other Ambulatory Visit (HOSPITAL_COMMUNITY)
Admission: AD | Admit: 2016-10-20 | Discharge: 2016-10-20 | Disposition: A | Discharge status: Home / Self Care | Payer: BLUE CROSS/BLUE SHIELD | Source: Ambulatory Visit | Attending: Hematology and Oncology | Admitting: Hematology and Oncology

## 2016-10-20 VITALS — BP 125/63 | HR 95 | Temp 97.9°F | Resp 18 | Ht 67.0 in | Wt 164.9 lb

## 2016-10-20 DIAGNOSIS — C50411 Malignant neoplasm of upper-outer quadrant of right female breast: Secondary | ICD-10-CM

## 2016-10-20 DIAGNOSIS — E876 Hypokalemia: Secondary | ICD-10-CM

## 2016-10-20 DIAGNOSIS — R197 Diarrhea, unspecified: Secondary | ICD-10-CM

## 2016-10-20 DIAGNOSIS — Z5111 Encounter for antineoplastic chemotherapy: Secondary | ICD-10-CM

## 2016-10-20 DIAGNOSIS — Z5112 Encounter for antineoplastic immunotherapy: Secondary | ICD-10-CM | POA: Diagnosis not present

## 2016-10-20 DIAGNOSIS — Z170 Estrogen receptor positive status [ER+]: Secondary | ICD-10-CM | POA: Diagnosis not present

## 2016-10-20 LAB — COMPREHENSIVE METABOLIC PANEL
ALK PHOS: 93 U/L (ref 40–150)
ALT: 24 U/L (ref 0–55)
AST: 16 U/L (ref 5–34)
Albumin: 3.2 g/dL — ABNORMAL LOW (ref 3.5–5.0)
Anion Gap: 10 mEq/L (ref 3–11)
BUN: 8.4 mg/dL (ref 7.0–26.0)
CHLORIDE: 100 meq/L (ref 98–109)
CO2: 27 mEq/L (ref 22–29)
Calcium: 8.8 mg/dL (ref 8.4–10.4)
Creatinine: 0.6 mg/dL (ref 0.6–1.1)
GLUCOSE: 121 mg/dL (ref 70–140)
POTASSIUM: 2.8 meq/L — AB (ref 3.5–5.1)
SODIUM: 137 meq/L (ref 136–145)
Total Bilirubin: 1.85 mg/dL — ABNORMAL HIGH (ref 0.20–1.20)
Total Protein: 6.1 g/dL — ABNORMAL LOW (ref 6.4–8.3)

## 2016-10-20 LAB — CBC WITH DIFFERENTIAL/PLATELET
BASO%: 0.2 % (ref 0.0–2.0)
BASOS ABS: 0 10*3/uL (ref 0.0–0.1)
EOS ABS: 0 10*3/uL (ref 0.0–0.5)
EOS%: 0.2 % (ref 0.0–7.0)
HCT: 30.6 % — ABNORMAL LOW (ref 34.8–46.6)
HEMOGLOBIN: 10.4 g/dL — AB (ref 11.6–15.9)
LYMPH#: 0.5 10*3/uL — AB (ref 0.9–3.3)
LYMPH%: 6.8 % — ABNORMAL LOW (ref 14.0–49.7)
MCH: 33.4 pg (ref 25.1–34.0)
MCHC: 34 g/dL (ref 31.5–36.0)
MCV: 98.1 fL (ref 79.5–101.0)
MONO#: 0.7 10*3/uL (ref 0.1–0.9)
MONO%: 9.8 % (ref 0.0–14.0)
NEUT#: 6.3 10*3/uL (ref 1.5–6.5)
NEUT%: 83 % — ABNORMAL HIGH (ref 38.4–76.8)
Platelets: 90 10*3/uL — ABNORMAL LOW (ref 145–400)
RBC: 3.12 10*6/uL — ABNORMAL LOW (ref 3.70–5.45)
RDW: 17.9 % — AB (ref 11.2–14.5)
WBC: 7.6 10*3/uL (ref 3.9–10.3)

## 2016-10-20 LAB — C DIFFICILE QUICK SCREEN W PCR REFLEX
C DIFFICILE (CDIFF) INTERP: DETECTED
C DIFFICLE (CDIFF) ANTIGEN: POSITIVE — AB
C Diff toxin: POSITIVE — AB

## 2016-10-20 MED ORDER — SODIUM CHLORIDE 0.9 % IV SOLN: 480.0000 mg | Freq: Once | INTRAVENOUS | Status: DC

## 2016-10-20 MED ORDER — HYDROCORTISONE ACETATE 25 MG RE SUPP: 25.0000 mg | Freq: Two times a day (BID) | RECTAL | 0 refills | Status: DC

## 2016-10-20 MED ORDER — ACETAMINOPHEN 325 MG PO TABS
ORAL_TABLET | ORAL | Status: AC
  Filled 2016-10-20: qty 2

## 2016-10-20 MED ORDER — SODIUM CHLORIDE 0.9 % IV SOLN
440.0000 mg | Freq: Once | INTRAVENOUS | Status: AC
  Administered 2016-10-20: 440 mg via INTRAVENOUS
  Filled 2016-10-20: qty 44

## 2016-10-20 MED ORDER — HEPARIN SOD (PORK) LOCK FLUSH 100 UNIT/ML IV SOLN
500.0000 [IU] | Freq: Once | INTRAVENOUS | Status: AC | PRN
  Administered 2016-10-20: 500 [IU]
  Filled 2016-10-20: qty 5

## 2016-10-20 MED ORDER — METRONIDAZOLE 500 MG PO TABS: 500.0000 mg | ORAL_TABLET | Freq: Four times a day (QID) | ORAL | 0 refills | Status: DC

## 2016-10-20 MED ORDER — SODIUM CHLORIDE 0.9% FLUSH
10.0000 mL | INTRAVENOUS | Status: DC | PRN
  Administered 2016-10-20: 10 mL
  Filled 2016-10-20: qty 10

## 2016-10-20 MED ORDER — DIPHENHYDRAMINE HCL 25 MG PO CAPS
ORAL_CAPSULE | ORAL | Status: AC
  Filled 2016-10-20: qty 2

## 2016-10-20 MED ORDER — ACETAMINOPHEN 325 MG PO TABS
650.0000 mg | ORAL_TABLET | Freq: Once | ORAL | Status: AC
  Administered 2016-10-20: 650 mg via ORAL

## 2016-10-20 MED ORDER — SODIUM CHLORIDE 0.9 % IV SOLN
420.0000 mg | Freq: Once | INTRAVENOUS | Status: AC
  Administered 2016-10-20: 420 mg via INTRAVENOUS
  Filled 2016-10-20: qty 14

## 2016-10-20 MED ORDER — PALONOSETRON HCL INJECTION 0.25 MG/5ML
0.2500 mg | Freq: Once | INTRAVENOUS | Status: AC
  Administered 2016-10-20: 0.25 mg via INTRAVENOUS

## 2016-10-20 MED ORDER — PALONOSETRON HCL INJECTION 0.25 MG/5ML
INTRAVENOUS | Status: AC
  Filled 2016-10-20: qty 5

## 2016-10-20 MED ORDER — SODIUM CHLORIDE 0.9 % IV SOLN
Freq: Once | INTRAVENOUS | Status: AC
  Administered 2016-10-20: 10:00:00 via INTRAVENOUS

## 2016-10-20 MED ORDER — SODIUM CHLORIDE 0.9 % IV SOLN
Freq: Once | INTRAVENOUS | Status: AC
  Administered 2016-10-20: 13:00:00 via INTRAVENOUS
  Filled 2016-10-20: qty 5

## 2016-10-20 MED ORDER — SODIUM CHLORIDE 0.9% FLUSH
10.0000 mL | INTRAVENOUS | Status: DC | PRN
  Administered 2016-10-20: 10 mL via INTRAVENOUS
  Filled 2016-10-20: qty 10

## 2016-10-20 MED ORDER — DIPHENHYDRAMINE HCL 25 MG PO CAPS
50.0000 mg | ORAL_CAPSULE | Freq: Once | ORAL | Status: AC
  Administered 2016-10-20: 50 mg via ORAL

## 2016-10-20 MED ORDER — POTASSIUM CHLORIDE CRYS ER 20 MEQ PO TBCR: 20.0000 meq | EXTENDED_RELEASE_TABLET | Freq: Every day | ORAL | 0 refills | Status: DC

## 2016-10-20 MED ORDER — FLUCONAZOLE 100 MG PO TABS: 100.0000 mg | ORAL_TABLET | Freq: Every day | ORAL | 0 refills | Status: DC

## 2016-10-20 MED ORDER — SODIUM CHLORIDE 0.9 % IV SOLN
Freq: Once | INTRAVENOUS | Status: AC
  Administered 2016-10-20: 11:00:00 via INTRAVENOUS
  Filled 2016-10-20: qty 500

## 2016-10-20 MED ORDER — SODIUM CHLORIDE 0.9 % IV SOLN
450.0000 mg | Freq: Once | INTRAVENOUS | Status: AC
  Administered 2016-10-20: 450 mg via INTRAVENOUS
  Filled 2016-10-20: qty 21.43

## 2016-10-20 NOTE — Progress Notes (Signed)
Patient Care Team: [name], MD as PCP - General (Pediatrics)  DIAGNOSIS:  Encounter Diagnosis  Name Primary?  . Malignant neoplasm of upper-outer quadrant of right breast in female, estrogen receptor positive ([place]) Yes    SUMMARY OF ONCOLOGIC HISTORY:   Breast cancer of upper-outer quadrant of right female breast ([place])   07/24/2016 Mammogram    Rt Breast 2 masses at 11-12:30 Position spanning 8 cm; Additional mass at 2 o clock: 1.5 cm; LN with cort thickening 2.7 cm      07/27/2016 Initial Diagnosis    Rt Biopsy 11oclock and 2 o clock: Grade 3 IDC with LVI; LN biopsy: Positive IDC; 11oclock: Er 100%, PR 90%, Ki 67: 90%; Her 2 Neg Heterogeneous; LN: Er 100%, PR: 90%; Ki67: 60%; Her 2 Pos Ratio 2.37; 2oclock: Er 100%, PR 90%; Ki 67: 90%; Her 2 Positive 2.52      08/16/2016 -  Neo-Adjuvant Chemotherapy    Neoadjuvant chemotherapy with [place] Perjeta       CHIEF COMPLIANT: Cycle 4 neoadjuvant chemotherapy  INTERVAL HISTORY: Cynthia Chavez is a 61 year old with above-mentioned history of right breast cancer who is currently on neoadjuvant chemotherapy and today is cycle 4. Taxotere was discontinued. She tolerated chemotherapy fairly well except for diarrhea that started back again. Previously she was diagnosed with C. difficile enterocolitis and was treated with vancomycin orally. She notices of the diarrhea restarted the last couple of days. Because of this she is having diaper rash and discomfort in the rectum.  REVIEW OF SYSTEMS:   Constitutional: Denies fevers, chills or abnormal weight loss Eyes: Denies blurriness of vision Ears, nose, mouth, throat, and face: Denies mucositis or sore throat Respiratory: Denies cough, dyspnea or wheezes Cardiovascular: Denies palpitation, chest discomfort Gastrointestinal:  Diarrhea due to C. difficile Skin: Denies abnormal skin rashes Lymphatics: Denies new lymphadenopathy or easy bruising Neurological:Denies numbness, tingling or new  weaknesses Behavioral/Psych: Mood is stable, no new changes  Extremities: No lower extremity edema  All other systems were reviewed with the patient and are negative.  I have reviewed the past medical history, past surgical history, social history and family history with the patient and they are unchanged from previous note.  ALLERGIES:  is allergic to ivp dye [iodinated diagnostic agents]; other; dilaudid [hydromorphone hcl]; asa [aspirin]; and codeine.  MEDICATIONS:  Current Outpatient Prescriptions  Medication Sig Dispense Refill  . amoxicillin-clavulanate (AUGMENTIN) 875-125 MG tablet Take 1 tablet by mouth 2 (two) times daily. (Patient not taking: Reported on 09/18/2016) 20 tablet 0  . aspirin EC 81 MG tablet Take 81 mg by mouth daily.    Marland Kitchen dexamethasone (DECADRON) 4 MG tablet Take 1 tablet (4 mg total) by mouth daily. Take 1 tab day before and 1 tab day after chemo 15 tablet 0  . diphenoxylate-atropine (LOMOTIL) 2.5-0.025 MG tablet Take 2 tablets by mouth 4 (four) times daily as needed for diarrhea or loose stools. 45 tablet 0  . hydrocortisone (ANUSOL-HC) 25 MG suppository Place 1 suppository (25 mg total) rectally 2 (two) times daily. (Patient not taking: Reported on 09/18/2016) 12 suppository 0  . levothyroxine (SYNTHROID, LEVOTHROID) 125 MCG tablet Take 1 tablet (125 mcg total) by mouth daily before breakfast. 90 tablet 1  . lidocaine-prilocaine (EMLA) cream Apply to affected area once 30 g 3  . loperamide (IMODIUM) 2 MG capsule Take 2 mg by mouth as needed for diarrhea or loose stools.    Marland Kitchen LORazepam (ATIVAN) 0.5 MG tablet Take 1 tablet (0.5 mg total)  by mouth at bedtime. As needed for sleep (Patient not taking: Reported on 09/18/2016) 30 tablet 0  . neomycin-polymyxin-hydrocortisone (CORTISPORIN) otic solution Place 4 drops into the left ear 4 (four) times daily. (Patient not taking: Reported on 09/18/2016) 10 mL 0  . ondansetron (ZOFRAN) 8 MG tablet Take 1 tablet (8 mg total) by mouth  2 (two) times daily as needed for refractory nausea / vomiting. Start on day 3 after chemo. 30 tablet 1  . prochlorperazine (COMPAZINE) 10 MG tablet Take 1 tablet (10 mg total) by mouth every 6 (six) hours as needed (Nausea or vomiting). 30 tablet 1  . vancomycin (VANCOCIN) 125 MG capsule Take 1 capsule (125 mg total) by mouth 4 (four) times daily. 40 capsule 0   No current facility-administered medications for this visit.     PHYSICAL EXAMINATION: ECOG PERFORMANCE STATUS: 1 - Symptomatic but completely ambulatory  Vitals:   10/20/16 0920  BP: 125/63  Pulse: 95  Resp: 18  Temp: 97.9 F (36.6 C)   Filed Weights   10/20/16 0920  Weight: 164 lb 14.4 oz (74.8 kg)    GENERAL:alert, no distress and comfortable SKIN: skin color, texture, turgor are normal, no rashes or significant lesions EYES: normal, Conjunctiva are pink and non-injected, sclera clear OROPHARYNX:no exudate, no erythema and lips, buccal mucosa, and tongue normal  NECK: supple, thyroid normal size, non-tender, without nodularity LYMPH:  no palpable lymphadenopathy in the cervical, axillary or inguinal LUNGS: clear to auscultation and percussion with normal breathing effort HEART: regular rate & rhythm and no murmurs and no lower extremity edema ABDOMEN:abdomen soft, non-tender and normal bowel sounds MUSCULOSKELETAL:no cyanosis of digits and no clubbing  NEURO: alert & oriented x 3 with fluent speech, no focal motor/sensory deficits EXTREMITIES: No lower extremity edema  LABORATORY DATA:  I have reviewed the data as listed   Chemistry      Component Value Date/Time   NA 140 09/29/2016 0954   K 3.8 09/29/2016 0954   CL 107 08/14/2016 1605   CO2 26 09/29/2016 0954   BUN 9.2 09/29/2016 0954   CREATININE 0.6 09/29/2016 0954      Component Value Date/Time   CALCIUM 8.9 09/29/2016 0954   ALKPHOS 102 09/29/2016 0954   AST 17 09/29/2016 0954   ALT 22 09/29/2016 0954   BILITOT 0.43 09/29/2016 0954       Lab  Results  Component Value Date   WBC 7.6 10/20/2016   HGB 10.4 (L) 10/20/2016   HCT 30.6 (L) 10/20/2016   MCV 98.1 10/20/2016   PLT 90 (L) 10/20/2016   NEUTROABS 6.3 10/20/2016    ASSESSMENT & PLAN:  Breast cancer of upper-outer quadrant of right female breast ([facility]) Rt Biopsy 11oclock and 2 o clock: Grade 3 IDC with LVI; LN biopsy: Positive IDC; 11oclock: Er 100%, PR 90%, Ki 67: 90%; Her 2 Neg Heterogeneous; LN: Er 100%, PR: 90%; Ki67: 60%; Her 2 Pos Ratio 2.37; 2oclock: Er 100%, PR 90%; Ki 67: 90%; Her 2 Positive 2.52  Rt Breast 2 masses at 11-12:30 Position spanning 8 cm; Additional hihly susp mass at 2 o clock: 1.5 cm; LN with cort thickening 2.7 cm Breast MRI on 08/13/2016: 8 cm span of breast cancer with multiple enlarged axillary lymph nodes. CT CAP: 08/17/16: 4 mm right middle lobe pulmonary nodule unlikely to be metastatic disease Bone scan 08/17/2016: No evidence of metastatic disease  Recommendationbased on multidisciplinary tumor board: 1. Neoadjuvant chemotherapy with TCH Perjeta 6 cycles followed by Herceptin maintenance  for 1 year 2. Followed by breast conserving surgery if possible with sentinel lymph node study 3. Followed by adjuvant radiation therapy 4. Followed by Anti-estrogen therapy -------------------------------------------------------------------------------------------------------------------------------------------------------- Current treatment:Cycle  4 of neoadjuvant TCH Perjeta (Stopped Taxotere with cycle 3) Chemotherapy toxicities:  1. Nausea grade 1: Patient with around-the-clock Zofran and Compazine and this helped her. 2. Fatigue 3. Loss of taste 4. 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 Discontinued Taxotere from cycle 3 5. Recurrent C. difficile enterocolitis: I prescribed her Flagyl 500 mg by mouth 4 times a day because a stool for C. difficile was positive once again.  Return to clinic in 3weeks for cycle 5  I spent 25 minutes talking to the  patient of which more than half was spent in counseling and coordination of care.  No orders of the defined types were placed in this encounter.  The patient has a good understanding of the overall plan. she agrees with it. she will call with any problems that may develop before the next visit here.   Rulon Eisenmenger, MD 10/20/16

## 2016-10-20 NOTE — Assessment & Plan Note (Signed)
Rt Biopsy 11oclock and 2 o clock: Grade 3 IDC with LVI; LN biopsy: Positive IDC; 11oclock: Er 100%, PR 90%, Ki 67: 90%; Her 2 Neg Heterogeneous; LN: Er 100%, PR: 90%; Ki67: 60%; Her 2 Pos Ratio 2.37; 2oclock: Er 100%, PR 90%; Ki 67: 90%; Her 2 Positive 2.52  Rt Breast 2 masses at 11-12:30 Position spanning 8 cm; Additional hihly susp mass at 2 o clock: 1.5 cm; LN with cort thickening 2.7 cm Breast MRI on 08/13/2016: 8 cm span of breast cancer with multiple enlarged axillary lymph nodes. CT CAP: 08/17/16: 4 mm right middle lobe pulmonary nodule unlikely to be metastatic disease Bone scan 08/17/2016: No evidence of metastatic disease  Recommendationbased on multidisciplinary tumor board: 1. Neoadjuvant chemotherapy with TCH Perjeta 6 cycles followed by Herceptin maintenance for 1 year 2. Followed by breast conserving surgery if possible with sentinel lymph node study 3. Followed by adjuvant radiation therapy 4. Followed by Anti-estrogen therapy -------------------------------------------------------------------------------------------------------------------------------------------------------- Current treatment:Cycle  4 of neoadjuvant TCH Perjeta (Stopped Taxotere with cycle 3) Chemotherapy toxicities:  1. Nausea grade 1: Patient with around-the-clock Zofran and Compazine and this helped her. 2. Fatigue 3. Loss of taste 4. Chemotherapy-induced neutropenia: ANC today is  5. Constipation alternating with diarrhea: Constipation due to nausea medications and diarrhea due to Perjeta. 6. Severe diarrhea: Improved with Imodium 7. 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 Discontinued Taxotere from cycle 3  Return to clinic in 3weeks for cycle 5

## 2016-10-20 NOTE — Patient Instructions (Addendum)
[facility] Discharge Instructions for Patients Receiving Chemotherapy  Today you received the following chemotherapy agents Herceptin, Perjeta, Taxotere and Carboplatin   To help prevent nausea and vomiting after your treatment, we encourage you to take your nausea medication as directed. No Zofran for 3 days. Take Compazine instead.    If you develop nausea and vomiting that is not controlled by your nausea medication, call the clinic.   BELOW ARE SYMPTOMS THAT SHOULD BE REPORTED IMMEDIATELY:  *FEVER GREATER THAN 100.5 F  *CHILLS WITH OR WITHOUT FEVER  NAUSEA AND VOMITING THAT IS NOT CONTROLLED WITH YOUR NAUSEA MEDICATION  *UNUSUAL SHORTNESS OF BREATH  *UNUSUAL BRUISING OR BLEEDING  TENDERNESS IN MOUTH AND THROAT WITH OR WITHOUT PRESENCE OF ULCERS  *URINARY PROBLEMS  *BOWEL PROBLEMS  UNUSUAL RASH Items with * indicate a potential emergency and should be followed up as soon as possible.  Feel free to call the clinic you have any questions or concerns. The clinic phone number is [phone].  Please show the [facility] at check-in to the Emergency Department and triage nurse.  Trastuzumab injection for infusion What is this medicine? TRASTUZUMAB (tras TOO zoo mab) is a monoclonal antibody. It is used to treat breast cancer and stomach cancer. This medicine may be used for other purposes; ask your health care provider or pharmacist if you have questions. COMMON BRAND NAME(S): Herceptin What should I tell my health care provider before I take this medicine? They need to know if you have any of these conditions: -heart disease -heart failure -infection (especially a virus infection such as chickenpox, cold sores, or herpes) -lung or breathing disease, like asthma -recent or ongoing radiation therapy -an unusual or allergic reaction to trastuzumab, benzyl alcohol, or other medications, foods, dyes, or preservatives -pregnant or trying to get  pregnant -breast-feeding How should I use this medicine? This drug is given as an infusion into a vein. It is administered in a hospital or clinic by a specially trained health care professional. Talk to your pediatrician regarding the use of this medicine in children. This medicine is not approved for use in children. Overdosage: If you think you have taken too much of this medicine contact a poison control center or emergency room at once. NOTE: This medicine is only for you. Do not share this medicine with others. What if I miss a dose? It is important not to miss a dose. Call your doctor or health care professional if you are unable to keep an appointment. What may interact with this medicine? -doxorubicin -warfarin This list may not describe all possible interactions. Give your health care provider a list of all the medicines, herbs, non-prescription drugs, or dietary supplements you use. Also tell them if you smoke, drink alcohol, or use illegal drugs. Some items may interact with your medicine. What should I watch for while using this medicine? Visit your doctor for checks on your progress. Report any side effects. Continue your course of treatment even though you feel ill unless your doctor tells you to stop. Call your doctor or health care professional for advice if you get a fever, chills or sore throat, or other symptoms of a cold or flu. Do not treat yourself. Try to avoid being around people who are sick. You may experience fever, chills and shaking during your first infusion. These effects are usually mild and can be treated with other medicines. Report any side effects during the infusion to your health care professional. Fever and  chills usually do not happen with later infusions. Do not become pregnant while taking this medicine or for 7 months after stopping it. Women should inform their doctor if they wish to become pregnant or think they might be pregnant. Women of child-bearing  potential will need to have a negative pregnancy test before starting this medicine. There is a potential for serious side effects to an unborn child. Talk to your health care professional or pharmacist for more information. Do not breast-feed an infant while taking this medicine or for 7 months after stopping it. Women must use effective birth control with this medicine. What side effects may I notice from receiving this medicine? Side effects that you should report to your doctor or health care professional as soon as possible: -breathing difficulties -chest pain or palpitations -cough -dizziness or fainting -fever or chills, sore throat -skin rash, itching or hives -swelling of the legs or ankles -unusually weak or tired Side effects that usually do not require medical attention (report to your doctor or health care professional if they continue or are bothersome): -loss of appetite -headache -muscle aches -nausea This list may not describe all possible side effects. Call your doctor for medical advice about side effects. You may report side effects to FDA at 1-800-FDA-1088. Where should I keep my medicine? This drug is given in a hospital or clinic and will not be stored at home. NOTE: This sheet is a summary. It may not cover all possible information. If you have questions about this medicine, talk to your doctor, pharmacist, or health care provider.  2017 Elsevier/Gold Standard (2015-08-25 17:16:44)  Pembrolizumab injection What is this medicine? PEMBROLIZUMAB (pem broe liz ue mab) is a monoclonal antibody. It is used to treat melanoma, head and neck cancer, Hodgkin lymphoma, and non-small cell lung cancer. COMMON BRAND NAME(S): Keytruda What should I tell my health care provider before I take this medicine? They need to know if you have any of these conditions: -diabetes -immune system problems -inflammatory bowel disease -liver disease -lung or breathing  disease -lupus -organ transplant -an unusual or allergic reaction to pembrolizumab, other medicines, foods, dyes, or preservatives -pregnant or trying to get pregnant -breast-feeding How should I use this medicine? This medicine is for infusion into a vein. It is given by a health care professional in a hospital or clinic setting. A special MedGuide will be given to you before each treatment. Be sure to read this information carefully each time. Talk to your pediatrician regarding the use of this medicine in children. While this drug may be prescribed for selected conditions, precautions do apply. What if I miss a dose? It is important not to miss your dose. Call your doctor or health care professional if you are unable to keep an appointment. What may interact with this medicine? Interactions have not been studied. Give your health care provider a list of all the medicines, herbs, non-prescription drugs, or dietary supplements you use. Also tell them if you smoke, drink alcohol, or use illegal drugs. Some items may interact with your medicine. What should I watch for while using this medicine? Your condition will be monitored carefully while you are receiving this medicine. You may need blood work done while you are taking this medicine. Do not become pregnant while taking this medicine or for 4 months after stopping it. Women should inform their doctor if they wish to become pregnant or think they might be pregnant. There is a potential for serious side effects to an  unborn child. Talk to your health care professional or pharmacist for more information. Do not breast-feed an infant while taking this medicine or for 4 months after the last dose. What side effects may I notice from receiving this medicine? Side effects that you should report to your doctor or health care professional as soon as possible: -allergic reactions like skin rash, itching or hives, swelling of the face, lips, or  tongue -bloody or black, tarry -breathing problems -changes in vision -chest pain -chills -constipation -cough -dizziness or feeling faint or lightheaded -fast or irregular heartbeat -fever -flushing -hair loss -low blood counts - this medicine may decrease the number of white blood cells, red blood cells and platelets. You may be at increased risk for infections and bleeding. -muscle pain -muscle weakness -persistent headache -signs and symptoms of high blood sugar such as dizziness; dry mouth; dry skin; fruity breath; nausea; stomach pain; increased hunger or thirst; increased urination -signs and symptoms of kidney injury like trouble passing urine or change in the amount of urine -signs and symptoms of liver injury like dark urine, light-colored stools, loss of appetite, nausea, right upper belly pain, yellowing of the eyes or skin -stomach pain -sweating -weight loss Side effects that usually do not require medical attention (report to your doctor or health care professional if they continue or are bothersome): -decreased appetite -diarrhea -tiredness Where should I keep my medicine? This drug is given in a hospital or clinic and will not be stored at home.  2017 Elsevier/Gold Standard (2015-10-25 19:28:44)  Docetaxel injection What is this medicine? DOCETAXEL (doe se TAX el) is a chemotherapy drug. It targets fast dividing cells, like cancer cells, and causes these cells to die. This medicine is used to treat many types of cancers like breast cancer, certain stomach cancers, head and neck cancer, lung cancer, and prostate cancer. This medicine may be used for other purposes; ask your health care provider or pharmacist if you have questions. COMMON BRAND NAME(S): Docefrez, Taxotere What should I tell my health care provider before I take this medicine? They need to know if you have any of these conditions: -infection (especially a virus infection such as chickenpox, cold  sores, or herpes) -liver disease -low blood counts, like low white cell, platelet, or red cell counts -an unusual or allergic reaction to docetaxel, polysorbate 80, other chemotherapy agents, other medicines, foods, dyes, or preservatives -pregnant or trying to get pregnant -breast-feeding How should I use this medicine? This drug is given as an infusion into a vein. It is administered in a hospital or clinic by a specially trained health care professional. Talk to your pediatrician regarding the use of this medicine in children. Special care may be needed. Overdosage: If you think you have taken too much of this medicine contact a poison control center or emergency room at once. NOTE: This medicine is only for you. Do not share this medicine with others. What if I miss a dose? It is important not to miss your dose. Call your doctor or health care professional if you are unable to keep an appointment. What may interact with this medicine? -cyclosporine -erythromycin -ketoconazole -medicines to increase blood counts like filgrastim, pegfilgrastim, sargramostim -vaccines Talk to your doctor or health care professional before taking any of these medicines: -acetaminophen -aspirin -ibuprofen -ketoprofen -naproxen This list may not describe all possible interactions. Give your health care provider a list of all the medicines, herbs, non-prescription drugs, or dietary supplements you use. Also tell them if  you smoke, drink alcohol, or use illegal drugs. Some items may interact with your medicine. What should I watch for while using this medicine? Your condition will be monitored carefully while you are receiving this medicine. You will need important blood work done while you are taking this medicine. This drug may make you feel generally unwell. This is not uncommon, as chemotherapy can affect healthy cells as well as cancer cells. Report any side effects. Continue your course of treatment  even though you feel ill unless your doctor tells you to stop. In some cases, you may be given additional medicines to help with side effects. Follow all directions for their use. Call your doctor or health care professional for advice if you get a fever, chills or sore throat, or other symptoms of a cold or flu. Do not treat yourself. This drug decreases your body's ability to fight infections. Try to avoid being around people who are sick. This medicine may increase your risk to bruise or bleed. Call your doctor or health care professional if you notice any unusual bleeding. This medicine may contain alcohol in the product. You may get drowsy or dizzy. Do not drive, use machinery, or do anything that needs mental alertness until you know how this medicine affects you. Do not stand or sit up quickly, especially if you are an older patient. This reduces the risk of dizzy or fainting spells. Avoid alcoholic drinks. Do not become pregnant while taking this medicine. Women should inform their doctor if they wish to become pregnant or think they might be pregnant. There is a potential for serious side effects to an unborn child. Talk to your health care professional or pharmacist for more information. Do not breast-feed an infant while taking this medicine. What side effects may I notice from receiving this medicine? Side effects that you should report to your doctor or health care professional as soon as possible: -allergic reactions like skin rash, itching or hives, swelling of the face, lips, or tongue -low blood counts - This drug may decrease the number of white blood cells, red blood cells and platelets. You may be at increased risk for infections and bleeding. -signs of infection - fever or chills, cough, sore throat, pain or difficulty passing urine -signs of decreased platelets or bleeding - bruising, pinpoint red spots on the skin, black, tarry stools, nosebleeds -signs of decreased red blood cells  - unusually weak or tired, fainting spells, lightheadedness -breathing problems -fast or irregular heartbeat -low blood pressure -mouth sores -nausea and vomiting -pain, swelling, redness or irritation at the injection site -pain, tingling, numbness in the hands or feet -swelling of the ankle, feet, hands -weight gain Side effects that usually do not require medical attention (report to your doctor or health care professional if they continue or are bothersome): -bone pain -complete hair loss including hair on your head, underarms, pubic hair, eyebrows, and eyelashes -diarrhea -excessive tearing -changes in the color of fingernails -loosening of the fingernails -nausea -muscle pain -red flush to skin -sweating -weak or tired This list may not describe all possible side effects. Call your doctor for medical advice about side effects. You may report side effects to FDA at 1-800-FDA-1088. Where should I keep my medicine? This drug is given in a hospital or clinic and will not be stored at home. NOTE: This sheet is a summary. It may not cover all possible information. If you have questions about this medicine, talk to your doctor, pharmacist, or health care  provider.  2017 Elsevier/Gold Standard (2015-08-26 12:32:56)  Carboplatin injection What is this medicine? CARBOPLATIN (KAR boe pla tin) is a chemotherapy drug. It targets fast dividing cells, like cancer cells, and causes these cells to die. This medicine is used to treat ovarian cancer and many other cancers. This medicine may be used for other purposes; ask your health care provider or pharmacist if you have questions. COMMON BRAND NAME(S): Paraplatin What should I tell my health care provider before I take this medicine? They need to know if you have any of these conditions: -blood disorders -hearing problems -kidney disease -recent or ongoing radiation therapy -an unusual or allergic reaction to carboplatin, cisplatin,  other chemotherapy, other medicines, foods, dyes, or preservatives -pregnant or trying to get pregnant -breast-feeding How should I use this medicine? This drug is usually given as an infusion into a vein. It is administered in a hospital or clinic by a specially trained health care professional. Talk to your pediatrician regarding the use of this medicine in children. Special care may be needed. Overdosage: If you think you have taken too much of this medicine contact a poison control center or emergency room at once. NOTE: This medicine is only for you. Do not share this medicine with others. What if I miss a dose? It is important not to miss a dose. Call your doctor or health care professional if you are unable to keep an appointment. What may interact with this medicine? -medicines for seizures -medicines to increase blood counts like filgrastim, pegfilgrastim, sargramostim -some antibiotics like amikacin, gentamicin, neomycin, streptomycin, tobramycin -vaccines Talk to your doctor or health care professional before taking any of these medicines: -acetaminophen -aspirin -ibuprofen -ketoprofen -naproxen This list may not describe all possible interactions. Give your health care provider a list of all the medicines, herbs, non-prescription drugs, or dietary supplements you use. Also tell them if you smoke, drink alcohol, or use illegal drugs. Some items may interact with your medicine. What should I watch for while using this medicine? Your condition will be monitored carefully while you are receiving this medicine. You will need important blood work done while you are taking this medicine. This drug may make you feel generally unwell. This is not uncommon, as chemotherapy can affect healthy cells as well as cancer cells. Report any side effects. Continue your course of treatment even though you feel ill unless your doctor tells you to stop. In some cases, you may be given additional  medicines to help with side effects. Follow all directions for their use. Call your doctor or health care professional for advice if you get a fever, chills or sore throat, or other symptoms of a cold or flu. Do not treat yourself. This drug decreases your body's ability to fight infections. Try to avoid being around people who are sick. This medicine may increase your risk to bruise or bleed. Call your doctor or health care professional if you notice any unusual bleeding. Be careful brushing and flossing your teeth or using a toothpick because you may get an infection or bleed more easily. If you have any dental work done, tell your dentist you are receiving this medicine. Avoid taking products that contain aspirin, acetaminophen, ibuprofen, naproxen, or ketoprofen unless instructed by your doctor. These medicines may hide a fever. Do not become pregnant while taking this medicine. Women should inform their doctor if they wish to become pregnant or think they might be pregnant. There is a potential for serious side effects to an  unborn child. Talk to your health care professional or pharmacist for more information. Do not breast-feed an infant while taking this medicine. What side effects may I notice from receiving this medicine? Side effects that you should report to your doctor or health care professional as soon as possible: -allergic reactions like skin rash, itching or hives, swelling of the face, lips, or tongue -signs of infection - fever or chills, cough, sore throat, pain or difficulty passing urine -signs of decreased platelets or bleeding - bruising, pinpoint red spots on the skin, black, tarry stools, nosebleeds -signs of decreased red blood cells - unusually weak or tired, fainting spells, lightheadedness -breathing problems -changes in hearing -changes in vision -chest pain -high blood pressure -low blood counts - This drug may decrease the number of white blood cells, red blood  cells and platelets. You may be at increased risk for infections and bleeding. -nausea and vomiting -pain, swelling, redness or irritation at the injection site -pain, tingling, numbness in the hands or feet -problems with balance, talking, walking -trouble passing urine or change in the amount of urine Side effects that usually do not require medical attention (report to your doctor or health care professional if they continue or are bothersome): -hair loss -loss of appetite -metallic taste in the mouth or changes in taste This list may not describe all possible side effects. Call your doctor for medical advice about side effects. You may report side effects to FDA at 1-800-FDA-1088. Where should I keep my medicine? This drug is given in a hospital or clinic and will not be stored at home. NOTE: This sheet is a summary. It may not cover all possible information. If you have questions about this medicine, talk to your doctor, pharmacist, or health care provider.  2017 Elsevier/Gold Standard (2007-10-29 14:38:05)   Dehydration, Adult Dehydration is when there is not enough fluid or water in your body. This happens when you lose more fluids than you take in. Dehydration can range from mild to very bad. It should be treated right away to keep it from getting very bad. Symptoms of mild dehydration may include:   Thirst.  Dry lips.  Slightly dry mouth.  Dry, warm skin.  Dizziness. Symptoms of moderate dehydration may include:   Very dry mouth.  Muscle cramps.  Dark pee (urine). Pee may be the color of tea.  Your body making less pee.  Your eyes making fewer tears.  Heartbeat that is uneven or faster than normal (palpitations).  Headache.  Light-headedness, especially when you stand up from sitting.  Fainting (syncope). Symptoms of very bad dehydration may include:   Changes in skin, such as:  Cold and clammy skin.  Blotchy (mottled) or pale skin.  Skin that does  not quickly return to normal after being lightly pinched and let go (poor skin turgor).  Changes in body fluids, such as:  Feeling very thirsty.  Your eyes making fewer tears.  Not sweating when body temperature is high, such as in hot weather.  Your body making very little pee.  Changes in vital signs, such as:  Weak pulse.  Pulse that is more than 100 beats a minute when you are sitting still.  Fast breathing.  Low blood pressure.  Other changes, such as:  Sunken eyes.  Cold hands and feet.  Confusion.  Lack of energy (lethargy).  Trouble waking up from sleep.  Short-term weight loss.  Unconsciousness. Follow these instructions at home:  If told by your doctor, drink  an ORS:  Make an ORS by using instructions on the package.  Start by drinking small amounts, about  cup (120 mL) every 5-10 minutes.  Slowly drink more until you have had the amount that your doctor said to have.  Drink enough clear fluid to keep your pee clear or pale yellow. If you were told to drink an ORS, finish the ORS first, then start slowly drinking clear fluids. Drink fluids such as:  Water. Do not drink only water by itself. Doing that can make the salt (sodium) level in your body get too low (hyponatremia).  Ice chips.  Fruit juice that you have added water to (diluted).  Low-calorie sports drinks.  Avoid:  Alcohol.  Drinks that have a lot of sugar. These include high-calorie sports drinks, fruit juice that does not have water added, and soda.  Caffeine.  Foods that are greasy or have a lot of fat or sugar.  Take over-the-counter and prescription medicines only as told by your doctor.  Do not take salt tablets. Doing that can make the salt level in your body get too high (hypernatremia).  Eat foods that have minerals (electrolytes). Examples include bananas, oranges, potatoes, tomatoes, and spinach.  Keep all follow-up visits as told by your doctor. This is  important. Contact a doctor if:  You have belly (abdominal) pain that:  Gets worse.  Stays in one area (localizes).  You have a rash.  You have a stiff neck.  You get angry or annoyed more easily than normal (irritability).  You are more sleepy than normal.  You have a harder time waking up than normal.  You feel:  Weak.  Dizzy.  Very thirsty.  You have peed (urinated) only a small amount of very dark pee during 6-8 hours. Get help right away if:  You have symptoms of very bad dehydration.  You cannot drink fluids without throwing up (vomiting).  Your symptoms get worse with treatment.  You have a fever.  You have a very bad headache.  You are throwing up or having watery poop (diarrhea) and it:  Gets worse.  Does not go away.  You have blood or something green (bile) in your throw-up.  You have blood in your poop (stool). This may cause poop to look black and tarry.  You have not peed in 6-8 hours.  You pass out (faint).  Your heart rate when you are sitting still is more than 100 beats a minute.  You have trouble breathing. This information is not intended to replace advice given to you by your health care provider. Make sure you discuss any questions you have with your health care provider. Document Released: 05/20/2009 Document Revised: 02/11/2016 Document Reviewed: 09/17/2015 Elsevier Interactive Patient Education  2017 [employer]   Clostridium Difficile Infection  Clostridium difficile (C. difficile or C. diff) infection causes inflammation of the large intestine (colon). This condition can result in damage to the lining of your colon and may lead to another condition called colitis. This infection can be passed from person to person (is contagious). Follow these instructions at home: Eating and drinking   Drink enough fluid to keep your pee (urine) clear or pale yellow.  Avoid drinking:  Milk.  Caffeine.  Alcohol.  Follow exact  instructions from your doctor about how to get enough fluid in your body (rehydrate).  Eat small meals often instead of large meals. Medicines   Take your antibiotic medicine as told by your doctor. Do not stop  taking the antibiotic even if you start to feel better unless your doctor told you to do that.  Take over-the-counter and prescription medicines only as told by your doctor.  Do not use medicines to help with watery poop (diarrhea). General instructions   Wash your hands fully before you prepare food and after you use the bathroom. Make sure people who live with you also wash their  hands often.  Clean the surfaces that you touch. Use a product that contains chlorine bleach.  Keep all follow-up visits as told by your doctor. This is important. Contact a doctor if:  Your symptoms do not get better with treatment.  Your symptoms get worse with treatment.  Your symptoms go away and then come back.  You have a fever.  You have new symptoms. Get help right away if:  You have more pain or tenderness in your belly (abdomen).  Your poop (stool) is mostly bloody.  Your poop looks dark black and tarry.  You cannot eat or drink without throwing up (vomiting).  You have signs of dehydration, such as:  Dark pee, very little pee, or no pee.  Cracked lips.  Not making tears when you cry.  Dry mouth.  Sunken eyes.  Feeling sleepy.  Feeling weak.  Feeling dizzy. This information is not intended to replace advice given to you by your health care provider. Make sure you discuss any questions you have with your health care provider. Document Released: 05/21/2009 Document Revised: 12/30/2015 Document Reviewed: 01/25/2015 Elsevier Interactive Patient Education  2017 [employer].  Hypokalemia Hypokalemia means that the amount of potassium in the blood is lower than normal.Potassium is a chemical that helps regulate the amount of fluid in the body (electrolyte). It  also stimulates muscle tightening (contraction) and helps nerves work properly.Normally, most of the body's potassium is inside of cells, and only a very small amount is in the blood. Because the amount in the blood is so small, minor changes to potassium levels in the blood can be life-threatening. What are the causes? This condition may be caused by:  Antibiotic medicine.  Diarrhea or vomiting. Taking too much of a medicine that helps you have a bowel movement (laxative) can cause diarrhea and lead to hypokalemia.  Chronic kidney disease (CKD).  Medicines that help the body get rid of excess fluid (diuretics).  Eating disorders, such as bulimia.  Low magnesium levels in the body.  Sweating a lot. What are the signs or symptoms? Symptoms of this condition include:  Weakness.  Constipation.  Fatigue.  Muscle cramps.  Mental confusion.  Skipped heartbeats or irregular heartbeat (palpitations).  Tingling or numbness. How is this diagnosed? This condition is diagnosed with a blood test. How is this treated? Hypokalemia can be treated by taking potassium supplements by mouth or adjusting the medicines that you take. Treatment may also include eating more foods that contain a lot of potassium. If your potassium level is very low, you may need to get potassium through an IV tube in one of your veins and be monitored in the hospital. Follow these instructions at home:  Take over-the-counter and prescription medicines only as told by your health care provider. This includes vitamins and supplements.  Eat a healthy diet. A healthy diet includes fresh fruits and vegetables, whole grains, healthy fats, and lean proteins.  If instructed, eat more foods that contain a lot of potassium, such as:  Nuts, such as peanuts and pistachios.  Seeds, such as sunflower seeds and  pumpkin seeds.  Peas, lentils, and lima beans.  Whole grain and bran cereals and breads.  Fresh fruits and  vegetables, such as apricots, avocado, bananas, cantaloupe, kiwi, oranges, tomatoes, asparagus, and potatoes.  Orange juice.  Tomato juice.  Red meats.  Yogurt.  Keep all follow-up visits as told by your health care provider. This is important. Contact a health care provider if:  You have weakness that gets worse.  You feel your heart pounding or racing.  You vomit.  You have diarrhea.  You have diabetes (diabetes mellitus) and you have trouble keeping your blood sugar (glucose) in your target range. Get help right away if:  You have chest pain.  You have shortness of breath.  You have vomiting or diarrhea that lasts for more than 2 days.  You faint. This information is not intended to replace advice given to you by your health care provider. Make sure you discuss any questions you have with your health care provider. Document Released: 07/24/2005 Document Revised: 03/11/2016 Document Reviewed: 03/11/2016 Elsevier Interactive Patient Education  2017 [employer].

## 2016-10-20 NOTE — Progress Notes (Signed)
Ok to treat per Dr Lindi Adie with platelets of 90. Will give IV Potassium today and send Rx for her to continue at home.

## 2016-10-24 ENCOUNTER — Telehealth: Payer: Self-pay

## 2016-10-24 NOTE — Telephone Encounter (Signed)
Called pt to return vm with concerns of abd pain from her antibiotics. Pt states that she had been taking flagyl 4x a day and started taking diflucan last night. Pt 1 week post herceptin/perjeta. Pt states that her stomach hurts and she doesn't know what to do about it. She tried to take her medicine with food and has had improvement with her diarrhea symptoms. Pt still goes 3x/day yellow loose stools and dealing with her hemorrhoid discomfort. Denies fever, n/v, sob or chest pain at this time. Advised pt to take over the counter pepcid or prilosec to help with irritation in her stomach. Also suggested that pt start taking probiotic to help with her symptoms. Pt states that she is eating and drinking the best that she can. Told pt to avoid foods that can irritate stomach such as red sauces, acidic foods, greasy and fatty foods. Pt verbalized understanding. Told pt that because she is post herceptin/perjeta, side effects may include inflammation in the stomach and diarrhea. Pt does have cdiff and can also have those symptoms. Explained to pt that the abdominal pain can be expected and will discuss symptoms with Dr.Gudena for any suggestions on managing her stomach symptoms. Pt does not want to take any new medication at this time. She feels very anxious and just wants this abdominal pain to go away. Told pt that we will update her with any other suggestions.

## 2016-10-30 ENCOUNTER — Telehealth: Payer: Self-pay | Admitting: *Deleted

## 2016-10-30 NOTE — Telephone Encounter (Signed)
"  I called earlier.  My leg is worrying me.  I woke up Saturday Morning with a vein that popped out two places on my left leg.  The left side and the upper part of leg at my panty line is where the veins popped out.  It's swollen and hurts when I press on it.  I placed ice on my leg to try to help it.  I've not had varicose veins in three years.  I'm on chemotherapy and do not know who I can see for this.  Call my cell phone to let me know what to do.  Return number [phone]."  2. "Could you ask if I need to take (complete) this Flagyl.  My bowels have improved.  I eat soy based yogurt and take the Florastor."     Will notify provider.

## 2016-11-01 ENCOUNTER — Ambulatory Visit (INDEPENDENT_AMBULATORY_CARE_PROVIDER_SITE_OTHER): Payer: BLUE CROSS/BLUE SHIELD | Admitting: Pediatrics

## 2016-11-01 ENCOUNTER — Encounter: Payer: Self-pay | Admitting: Pediatrics

## 2016-11-01 VITALS — BP 112/77 | HR 96 | Temp 98.9°F | Ht 67.0 in | Wt 162.0 lb

## 2016-11-01 DIAGNOSIS — K649 Unspecified hemorrhoids: Secondary | ICD-10-CM

## 2016-11-01 DIAGNOSIS — Z170 Estrogen receptor positive status [ER+]: Secondary | ICD-10-CM

## 2016-11-01 DIAGNOSIS — C50411 Malignant neoplasm of upper-outer quadrant of right female breast: Secondary | ICD-10-CM

## 2016-11-01 DIAGNOSIS — I809 Phlebitis and thrombophlebitis of unspecified site: Secondary | ICD-10-CM | POA: Diagnosis not present

## 2016-11-01 MED ORDER — HYDROCORTISONE 2.5 % RE CREA: 1.0000 "application " | TOPICAL_CREAM | Freq: Two times a day (BID) | RECTAL | 3 refills | Status: DC

## 2016-11-01 NOTE — Patient Instructions (Signed)
aspercream

## 2016-11-01 NOTE — Progress Notes (Signed)
  Subjective:   Patient ID: Cynthia Chavez, female    DOB: 03/10/1956, 61 y.o.   MRN: [id number] CC: Hemorrhoids (pain)  HPI: Cynthia Chavez is a 61 y.o. female presenting for Hemorrhoids (pain)  Noticed knot near rectum past two days Has had hemorrhoids before This feels harder Hurts when she is on her feet too long or when passes stool  Not constipated, sometimes has to sit for long periods of time before able to pass stool Has several superficial areas of redness L leg, present past 4-5 days No leg swelling Has two more treatments for breast cancer  Relevant past medical, surgical, family and social history reviewed. Allergies and medications reviewed and updated. History  Smoking Status  . Current Every Day Smoker  . Packs/day: 0.50  . Types: Cigarettes  . Start date: 04/19/1993  Smokeless Tobacco  . Never Used   ROS: Per HPI   Objective:    BP 112/77   Pulse 96   Temp 98.9 F (37.2 C) (Oral)   Ht 5\' 7"  (1.702 m)   Wt 162 lb (73.5 kg)   BMI 25.37 kg/m   Wt Readings from Last 3 Encounters:  11/01/16 162 lb (73.5 kg)  10/20/16 164 lb 14.4 oz (74.8 kg)  09/29/16 169 lb 1.6 oz (76.7 kg)    Gen: NAD, alert, cooperative with exam, NCAT EYES: EOMI, no conjunctival injection, or no icterus CV: NRRR, normal S1/S2, no murmur, distal pulses 2+ b/l Resp: CTABL, no wheezes, normal WOB Abd: +BS, soft, NTND. no guarding or organomegaly Neuro: Alert and oriented Rectal:several hemorhoids present, one flesh colored slightly firm hemorrhoid, mildly tender with palpation, no bleeding Skin: L anterior prox lower leg with 2cm area of slight warmth, redness, mildly tender. Prox L upper thigh with 2 cm of varicose vein slightly firm, no redness, warmth  Assessment & Plan:  Cynthia Chavez was seen today for hemorrhoids.  Diagnoses and all orders for this visit:  Hemorrhoids, unspecified hemorrhoid type Likely thrombosed with hardness Slightly tender Discussed options, referral to surgery for  procedure for resolution Pt wants to try hydrocortisone cream, will let me know if not improving Discussed constipation treatment, no straining -     hydrocortisone (ANUSOL-HC) 2.5 % rectal cream; Place 1 application rectally 2 (two) times daily.  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

## 2016-11-02 ENCOUNTER — Other Ambulatory Visit: Payer: Self-pay | Admitting: *Deleted

## 2016-11-02 ENCOUNTER — Ambulatory Visit (HOSPITAL_COMMUNITY)
Admission: RE | Admit: 2016-11-02 | Discharge: 2016-11-02 | Disposition: A | Discharge status: Home / Self Care | Payer: BLUE CROSS/BLUE SHIELD | Source: Ambulatory Visit | Attending: Pediatrics | Admitting: Pediatrics

## 2016-11-02 DIAGNOSIS — I8002 Phlebitis and thrombophlebitis of superficial vessels of left lower extremity: Secondary | ICD-10-CM | POA: Diagnosis present

## 2016-11-02 DIAGNOSIS — I868 Varicose veins of other specified sites: Secondary | ICD-10-CM | POA: Diagnosis not present

## 2016-11-06 NOTE — Telephone Encounter (Signed)
Saw PCP.  Doppler negative for DVT.

## 2016-11-10 ENCOUNTER — Ambulatory Visit: Payer: BLUE CROSS/BLUE SHIELD

## 2016-11-10 ENCOUNTER — Other Ambulatory Visit (HOSPITAL_BASED_OUTPATIENT_CLINIC_OR_DEPARTMENT_OTHER): Payer: BLUE CROSS/BLUE SHIELD

## 2016-11-10 ENCOUNTER — Ambulatory Visit (HOSPITAL_BASED_OUTPATIENT_CLINIC_OR_DEPARTMENT_OTHER): Payer: BLUE CROSS/BLUE SHIELD

## 2016-11-10 VITALS — BP 150/86 | HR 69 | Temp 98.6°F | Resp 16

## 2016-11-10 DIAGNOSIS — Z170 Estrogen receptor positive status [ER+]: Secondary | ICD-10-CM

## 2016-11-10 DIAGNOSIS — C50411 Malignant neoplasm of upper-outer quadrant of right female breast: Secondary | ICD-10-CM | POA: Diagnosis not present

## 2016-11-10 DIAGNOSIS — Z95828 Presence of other vascular implants and grafts: Secondary | ICD-10-CM

## 2016-11-10 DIAGNOSIS — Z5111 Encounter for antineoplastic chemotherapy: Secondary | ICD-10-CM

## 2016-11-10 DIAGNOSIS — Z5112 Encounter for antineoplastic immunotherapy: Secondary | ICD-10-CM

## 2016-11-10 LAB — CBC WITH DIFFERENTIAL/PLATELET
BASO%: 0.3 % (ref 0.0–2.0)
Basophils Absolute: 0 10*3/uL (ref 0.0–0.1)
EOS%: 1.5 % (ref 0.0–7.0)
Eosinophils Absolute: 0.1 10*3/uL (ref 0.0–0.5)
HCT: 33.7 % — ABNORMAL LOW (ref 34.8–46.6)
HEMOGLOBIN: 11.5 g/dL — AB (ref 11.6–15.9)
LYMPH%: 22.5 % (ref 14.0–49.7)
MCH: 34.3 pg — ABNORMAL HIGH (ref 25.1–34.0)
MCHC: 33.9 g/dL (ref 31.5–36.0)
MCV: 101.1 fL — ABNORMAL HIGH (ref 79.5–101.0)
MONO#: 0.5 10*3/uL (ref 0.1–0.9)
MONO%: 8 % (ref 0.0–14.0)
NEUT%: 67.7 % (ref 38.4–76.8)
NEUTROS ABS: 4 10*3/uL (ref 1.5–6.5)
Platelets: 146 10*3/uL (ref 145–400)
RBC: 3.34 10*6/uL — AB (ref 3.70–5.45)
RDW: 19.5 % — AB (ref 11.2–14.5)
WBC: 5.9 10*3/uL (ref 3.9–10.3)
lymph#: 1.3 10*3/uL (ref 0.9–3.3)

## 2016-11-10 LAB — COMPREHENSIVE METABOLIC PANEL
ALBUMIN: 3.6 g/dL (ref 3.5–5.0)
ALK PHOS: 80 U/L (ref 40–150)
ALT: 11 U/L (ref 0–55)
AST: 12 U/L (ref 5–34)
Anion Gap: 8 mEq/L (ref 3–11)
BILIRUBIN TOTAL: 0.43 mg/dL (ref 0.20–1.20)
BUN: 9.9 mg/dL (ref 7.0–26.0)
CO2: 27 meq/L (ref 22–29)
Calcium: 8.9 mg/dL (ref 8.4–10.4)
Chloride: 107 mEq/L (ref 98–109)
Creatinine: 0.6 mg/dL (ref 0.6–1.1)
EGFR: >90 mL/min/{1.73_m2} (ref >90)
GLUCOSE: 85 mg/dL (ref 70–140)
POTASSIUM: 3.6 meq/L (ref 3.5–5.1)
SODIUM: 143 meq/L (ref 136–145)
TOTAL PROTEIN: 6.3 g/dL — AB (ref 6.4–8.3)

## 2016-11-10 MED ORDER — HEPARIN SOD (PORK) LOCK FLUSH 100 UNIT/ML IV SOLN
500.0000 [IU] | Freq: Once | INTRAVENOUS | Status: AC | PRN
  Administered 2016-11-10: 500 [IU]
  Filled 2016-11-10: qty 5

## 2016-11-10 MED ORDER — SODIUM CHLORIDE 0.9 % IV SOLN
Freq: Once | INTRAVENOUS | Status: AC
  Administered 2016-11-10: 11:00:00 via INTRAVENOUS
  Filled 2016-11-10: qty 5

## 2016-11-10 MED ORDER — ACETAMINOPHEN 325 MG PO TABS
ORAL_TABLET | ORAL | Status: AC
  Filled 2016-11-10: qty 2

## 2016-11-10 MED ORDER — DIPHENHYDRAMINE HCL 25 MG PO CAPS
ORAL_CAPSULE | ORAL | Status: AC
  Filled 2016-11-10: qty 2

## 2016-11-10 MED ORDER — SODIUM CHLORIDE 0.9 % IV SOLN
Freq: Once | INTRAVENOUS | Status: AC
  Administered 2016-11-10: 10:00:00 via INTRAVENOUS

## 2016-11-10 MED ORDER — PALONOSETRON HCL INJECTION 0.25 MG/5ML
0.2500 mg | Freq: Once | INTRAVENOUS | Status: AC
  Administered 2016-11-10: 0.25 mg via INTRAVENOUS

## 2016-11-10 MED ORDER — SODIUM CHLORIDE 0.9% FLUSH
10.0000 mL | INTRAVENOUS | Status: DC | PRN
  Administered 2016-11-10: 10 mL via INTRAVENOUS
  Filled 2016-11-10: qty 10

## 2016-11-10 MED ORDER — SODIUM CHLORIDE 0.9% FLUSH
10.0000 mL | INTRAVENOUS | Status: DC | PRN
  Administered 2016-11-10: 10 mL
  Filled 2016-11-10: qty 10

## 2016-11-10 MED ORDER — SODIUM CHLORIDE 0.9 % IV SOLN
440.0000 mg | Freq: Once | INTRAVENOUS | Status: AC
  Administered 2016-11-10: 440 mg via INTRAVENOUS
  Filled 2016-11-10: qty 44

## 2016-11-10 MED ORDER — DIPHENHYDRAMINE HCL 25 MG PO CAPS
50.0000 mg | ORAL_CAPSULE | Freq: Once | ORAL | Status: AC
  Administered 2016-11-10: 50 mg via ORAL

## 2016-11-10 MED ORDER — SODIUM CHLORIDE 0.9 % IV SOLN
420.0000 mg | Freq: Once | INTRAVENOUS | Status: AC
  Administered 2016-11-10: 420 mg via INTRAVENOUS
  Filled 2016-11-10: qty 14

## 2016-11-10 MED ORDER — TRASTUZUMAB CHEMO 150 MG IV SOLR
450.0000 mg | Freq: Once | INTRAVENOUS | Status: AC
  Administered 2016-11-10: 450 mg via INTRAVENOUS
  Filled 2016-11-10: qty 21.43

## 2016-11-10 MED ORDER — ACETAMINOPHEN 325 MG PO TABS
650.0000 mg | ORAL_TABLET | Freq: Once | ORAL | Status: AC
  Administered 2016-11-10: 650 mg via ORAL

## 2016-11-10 MED ORDER — PALONOSETRON HCL INJECTION 0.25 MG/5ML
INTRAVENOUS | Status: AC
  Filled 2016-11-10: qty 5

## 2016-11-10 NOTE — Patient Instructions (Signed)
[facility] Discharge Instructions for Patients Receiving Chemotherapy  Today you received the following chemotherapy agents:  Herceptin, Perjeta, and Carboplatin.  To help prevent nausea and vomiting after your treatment, we encourage you to take your nausea medication as directed.   If you develop nausea and vomiting that is not controlled by your nausea medication, call the clinic.   BELOW ARE SYMPTOMS THAT SHOULD BE REPORTED IMMEDIATELY:  *FEVER GREATER THAN 100.5 F  *CHILLS WITH OR WITHOUT FEVER  NAUSEA AND VOMITING THAT IS NOT CONTROLLED WITH YOUR NAUSEA MEDICATION  *UNUSUAL SHORTNESS OF BREATH  *UNUSUAL BRUISING OR BLEEDING  TENDERNESS IN MOUTH AND THROAT WITH OR WITHOUT PRESENCE OF ULCERS  *URINARY PROBLEMS  *BOWEL PROBLEMS  UNUSUAL RASH Items with * indicate a potential emergency and should be followed up as soon as possible.  Feel free to call the clinic you have any questions or concerns. The clinic phone number is [phone].  Please show the [facility] at check-in to the Emergency Department and triage nurse.

## 2016-11-30 ENCOUNTER — Other Ambulatory Visit: Payer: Self-pay

## 2016-11-30 DIAGNOSIS — Z170 Estrogen receptor positive status [ER+]: Principal | ICD-10-CM

## 2016-11-30 DIAGNOSIS — C50411 Malignant neoplasm of upper-outer quadrant of right female breast: Secondary | ICD-10-CM

## 2016-11-30 NOTE — Assessment & Plan Note (Signed)
Rt Biopsy 11oclock and 2 o clock: Grade 3 IDC with LVI; LN biopsy: Positive IDC; 11oclock: Er 100%, PR 90%, Ki 67: 90%; Her 2 Neg Heterogeneous; LN: Er 100%, PR: 90%; Ki67: 60%; Her 2 Pos Ratio 2.37; 2oclock: Er 100%, PR 90%; Ki 67: 90%; Her 2 Positive 2.52  Rt Breast 2 masses at 11-12:30 Position spanning 8 cm; Additional hihly susp mass at 2 o clock: 1.5 cm; LN with cort thickening 2.7 cm Breast MRI on 08/13/2016: 8 cm span of breast cancer with multiple enlarged axillary lymph nodes. CT CAP: 08/17/16: 4 mm right middle lobe pulmonary nodule unlikely to be metastatic disease Bone scan 08/17/2016: No evidence of metastatic disease  Recommendationbased on multidisciplinary tumor board: 1. Neoadjuvant chemotherapy with TCH Perjeta 6 cycles followed by Herceptin maintenance for 1 year 2. Followed by breast conserving surgery if possible with sentinel lymph node study 3. Followed by adjuvant radiation therapy 4. Followed by Anti-estrogen therapy -------------------------------------------------------------------------------------------------------------------------------------------------------- Current treatment:Cycle 5of neoadjuvant TCH Perjeta (Stopped Taxotere with cycle 3) Chemotherapy toxicities:  1. Nausea grade 1: Patient with around-the-clock Zofran and Compazine and this helped her. 2. Fatigue 3. Loss of taste 4. 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 Discontinued Taxotere from cycle 3 5. Recurrent C. difficile enterocolitis: I prescribed her Flagyl 500 mg by mouth 4 times a day because a stool for C. difficile was positive once again.  Return to clinic in 3weeks for cycle 6

## 2016-12-01 ENCOUNTER — Telehealth: Payer: Self-pay | Admitting: Hematology and Oncology

## 2016-12-01 ENCOUNTER — Encounter: Payer: Self-pay | Admitting: *Deleted

## 2016-12-01 ENCOUNTER — Ambulatory Visit (HOSPITAL_BASED_OUTPATIENT_CLINIC_OR_DEPARTMENT_OTHER): Payer: BLUE CROSS/BLUE SHIELD | Admitting: Hematology and Oncology

## 2016-12-01 ENCOUNTER — Ambulatory Visit: Payer: BLUE CROSS/BLUE SHIELD

## 2016-12-01 ENCOUNTER — Other Ambulatory Visit (HOSPITAL_BASED_OUTPATIENT_CLINIC_OR_DEPARTMENT_OTHER): Payer: BLUE CROSS/BLUE SHIELD

## 2016-12-01 ENCOUNTER — Other Ambulatory Visit: Payer: Self-pay | Admitting: *Deleted

## 2016-12-01 ENCOUNTER — Encounter: Payer: Self-pay | Admitting: Hematology and Oncology

## 2016-12-01 ENCOUNTER — Ambulatory Visit (HOSPITAL_BASED_OUTPATIENT_CLINIC_OR_DEPARTMENT_OTHER): Payer: BLUE CROSS/BLUE SHIELD

## 2016-12-01 DIAGNOSIS — Z170 Estrogen receptor positive status [ER+]: Secondary | ICD-10-CM

## 2016-12-01 DIAGNOSIS — C50411 Malignant neoplasm of upper-outer quadrant of right female breast: Secondary | ICD-10-CM

## 2016-12-01 DIAGNOSIS — Z95828 Presence of other vascular implants and grafts: Secondary | ICD-10-CM

## 2016-12-01 DIAGNOSIS — Z5112 Encounter for antineoplastic immunotherapy: Secondary | ICD-10-CM | POA: Diagnosis not present

## 2016-12-01 LAB — CBC WITH DIFFERENTIAL/PLATELET
BASO%: 0.6 % (ref 0.0–2.0)
Basophils Absolute: 0 10*3/uL (ref 0.0–0.1)
EOS%: 0.9 % (ref 0.0–7.0)
Eosinophils Absolute: 0.1 10*3/uL (ref 0.0–0.5)
HCT: 34.5 % — ABNORMAL LOW (ref 34.8–46.6)
HGB: 11.7 g/dL (ref 11.6–15.9)
LYMPH#: 1.3 10*3/uL (ref 0.9–3.3)
LYMPH%: 23 % (ref 14.0–49.7)
MCH: 34.9 pg — ABNORMAL HIGH (ref 25.1–34.0)
MCHC: 33.9 g/dL (ref 31.5–36.0)
MCV: 102.8 fL — ABNORMAL HIGH (ref 79.5–101.0)
MONO#: 0.4 10*3/uL (ref 0.1–0.9)
MONO%: 7.8 % (ref 0.0–14.0)
NEUT%: 67.7 % (ref 38.4–76.8)
NEUTROS ABS: 3.7 10*3/uL (ref 1.5–6.5)
PLATELETS: 165 10*3/uL (ref 145–400)
RBC: 3.35 10*6/uL — AB (ref 3.70–5.45)
RDW: 17.9 % — ABNORMAL HIGH (ref 11.2–14.5)
WBC: 5.5 10*3/uL (ref 3.9–10.3)

## 2016-12-01 LAB — COMPREHENSIVE METABOLIC PANEL
ALT: 17 U/L (ref 0–55)
ANION GAP: 9 meq/L (ref 3–11)
AST: 17 U/L (ref 5–34)
Albumin: 3.8 g/dL (ref 3.5–5.0)
Alkaline Phosphatase: 86 U/L (ref 40–150)
BILIRUBIN TOTAL: 0.47 mg/dL (ref 0.20–1.20)
BUN: 11.6 mg/dL (ref 7.0–26.0)
CHLORIDE: 108 meq/L (ref 98–109)
CO2: 25 meq/L (ref 22–29)
CREATININE: 0.6 mg/dL (ref 0.6–1.1)
Calcium: 9.4 mg/dL (ref 8.4–10.4)
EGFR: >90 mL/min/{1.73_m2} (ref >90)
GLUCOSE: 87 mg/dL (ref 70–140)
Potassium: 3.4 mEq/L — ABNORMAL LOW (ref 3.5–5.1)
Sodium: 142 mEq/L (ref 136–145)
TOTAL PROTEIN: 6.8 g/dL (ref 6.4–8.3)

## 2016-12-01 MED ORDER — SODIUM CHLORIDE 0.9 % IV SOLN
440.0000 mg | Freq: Once | INTRAVENOUS | Status: AC
  Administered 2016-12-01: 440 mg via INTRAVENOUS
  Filled 2016-12-01: qty 44

## 2016-12-01 MED ORDER — HEPARIN SOD (PORK) LOCK FLUSH 100 UNIT/ML IV SOLN
500.0000 [IU] | Freq: Once | INTRAVENOUS | Status: AC | PRN
  Administered 2016-12-01: 500 [IU]
  Filled 2016-12-01: qty 5

## 2016-12-01 MED ORDER — DIPHENHYDRAMINE HCL 25 MG PO CAPS
ORAL_CAPSULE | ORAL | Status: AC
  Filled 2016-12-01: qty 2

## 2016-12-01 MED ORDER — PALONOSETRON HCL INJECTION 0.25 MG/5ML
0.2500 mg | Freq: Once | INTRAVENOUS | Status: AC
  Administered 2016-12-01: 0.25 mg via INTRAVENOUS

## 2016-12-01 MED ORDER — SODIUM CHLORIDE 0.9% FLUSH
10.0000 mL | INTRAVENOUS | Status: DC | PRN
  Administered 2016-12-01: 10 mL
  Filled 2016-12-01: qty 10

## 2016-12-01 MED ORDER — HEPARIN SOD (PORK) LOCK FLUSH 100 UNIT/ML IV SOLN
500.0000 [IU] | Freq: Once | INTRAVENOUS | Status: DC
  Filled 2016-12-01: qty 5

## 2016-12-01 MED ORDER — ACETAMINOPHEN 325 MG PO TABS
650.0000 mg | ORAL_TABLET | Freq: Once | ORAL | Status: AC
  Administered 2016-12-01: 650 mg via ORAL

## 2016-12-01 MED ORDER — SODIUM CHLORIDE 0.9 % IV SOLN
Freq: Once | INTRAVENOUS | Status: AC
  Administered 2016-12-01: 12:00:00 via INTRAVENOUS
  Filled 2016-12-01: qty 5

## 2016-12-01 MED ORDER — PALONOSETRON HCL INJECTION 0.25 MG/5ML
INTRAVENOUS | Status: AC
  Filled 2016-12-01: qty 5

## 2016-12-01 MED ORDER — SODIUM CHLORIDE 0.9% FLUSH
10.0000 mL | INTRAVENOUS | Status: DC | PRN
  Administered 2016-12-01: 10 mL via INTRAVENOUS
  Filled 2016-12-01: qty 10

## 2016-12-01 MED ORDER — DIPHENHYDRAMINE HCL 25 MG PO CAPS
50.0000 mg | ORAL_CAPSULE | Freq: Once | ORAL | Status: AC
  Administered 2016-12-01: 50 mg via ORAL

## 2016-12-01 MED ORDER — TRASTUZUMAB CHEMO 150 MG IV SOLR
6.0000 mg/kg | Freq: Once | INTRAVENOUS | Status: AC
  Administered 2016-12-01: 441 mg via INTRAVENOUS
  Filled 2016-12-01: qty 21

## 2016-12-01 MED ORDER — SODIUM CHLORIDE 0.9 % IV SOLN
Freq: Once | INTRAVENOUS | Status: AC
  Administered 2016-12-01: 11:00:00 via INTRAVENOUS

## 2016-12-01 MED ORDER — ACETAMINOPHEN 325 MG PO TABS
ORAL_TABLET | ORAL | Status: AC
  Filled 2016-12-01: qty 2

## 2016-12-01 MED ORDER — SODIUM CHLORIDE 0.9 % IV SOLN
420.0000 mg | Freq: Once | INTRAVENOUS | Status: AC
  Administered 2016-12-01: 420 mg via INTRAVENOUS
  Filled 2016-12-01: qty 14

## 2016-12-01 NOTE — Patient Instructions (Signed)
Implanted Port Insertion, Care After °This sheet gives you information about how to care for yourself after your procedure. Your health care provider may also give you more specific instructions. If you have problems or questions, contact your health care provider. °What can I expect after the procedure? °After your procedure, it is common to have: °· Discomfort at the port insertion site. °· Bruising on the skin over the port. This should improve over 3-4 days. ° °Follow these instructions at home: °Port care °· After your port is placed, you will get a manufacturer's information card. The card has information about your port. Keep this card with you at all times. °· Take care of the port as told by your health care provider. Ask your health care provider if you or a family member can get training for taking care of the port at home. A home health care nurse may also take care of the port. °· Make sure to remember what type of port you have. °Incision care °· Follow instructions from your health care provider about how to take care of your port insertion site. Make sure you: °? Wash your hands with soap and water before you change your bandage (dressing). If soap and water are not available, use hand sanitizer. °? Change your dressing as told by your health care provider. °? Leave stitches (sutures), skin glue, or adhesive strips in place. These skin closures may need to stay in place for 2 weeks or longer. If adhesive strip edges start to loosen and curl up, you may trim the loose edges. Do not remove adhesive strips completely unless your health care provider tells you to do that. °· Check your port insertion site every day for signs of infection. Check for: °? More redness, swelling, or pain. °? More fluid or blood. °? Warmth. °? Pus or a bad smell. °General instructions °· Do not take baths, swim, or use a hot tub until your health care provider approves. °· Do not lift anything that is heavier than 10 lb (4.5  kg) for a week, or as told by your health care provider. °· Ask your health care provider when it is okay to: °? Return to work or school. °? Resume usual physical activities or sports. °· Do not drive for 24 hours if you were given a medicine to help you relax (sedative). °· Take over-the-counter and prescription medicines only as told by your health care provider. °· Wear a medical alert bracelet in case of an emergency. This will tell any health care providers that you have a port. °· Keep all follow-up visits as told by your health care provider. This is important. °Contact a health care provider if: °· You cannot flush your port with saline as directed, or you cannot draw blood from the port. °· You have a fever or chills. °· You have more redness, swelling, or pain around your port insertion site. °· You have more fluid or blood coming from your port insertion site. °· Your port insertion site feels warm to the touch. °· You have pus or a bad smell coming from the port insertion site. °Get help right away if: °· You have chest pain or shortness of breath. °· You have bleeding from your port that you cannot control. °Summary °· Take care of the port as told by your health care provider. °· Change your dressing as told by your health care provider. °· Keep all follow-up visits as told by your health care provider. °  This information is not intended to replace advice given to you by your health care provider. Make sure you discuss any questions you have with your health care provider. °Document Released: 05/14/2013 Document Revised: 06/14/2016 Document Reviewed: 06/14/2016 °Elsevier Interactive Patient Education © 2017 [employer] Inc. ° °

## 2016-12-01 NOTE — Progress Notes (Signed)
Patient Care Team: [name], MD as PCP - General (Pediatrics)  DIAGNOSIS:  Encounter Diagnosis  Name Primary?  . Malignant neoplasm of upper-outer quadrant of right breast in female, estrogen receptor positive ([place])     SUMMARY OF ONCOLOGIC HISTORY:   Breast cancer of upper-outer quadrant of right female breast ([place])   07/24/2016 Mammogram    Rt Breast 2 masses at 11-12:30 Position spanning 8 cm; Additional mass at 2 o clock: 1.5 cm; LN with cort thickening 2.7 cm      07/27/2016 Initial Diagnosis    Rt Biopsy 11oclock and 2 o clock: Grade 3 IDC with LVI; LN biopsy: Positive IDC; 11oclock: Er 100%, PR 90%, Ki 67: 90%; Her 2 Neg Heterogeneous; LN: Er 100%, PR: 90%; Ki67: 60%; Her 2 Pos Ratio 2.37; 2oclock: Er 100%, PR 90%; Ki 67: 90%; Her 2 Positive 2.52      08/16/2016 -  Neo-Adjuvant Chemotherapy    Neoadjuvant chemotherapy with [place] Perjeta       CHIEF COMPLIANT: Cycle 6 neoadjuvant chemotherapy  INTERVAL HISTORY: Cynthia Chavez is a 61 year old with above-mentioned history of right breast cancer currently on neoadjuvant chemotherapy. Today is the last cycle of chemotherapy. We had to discontinue Taxotere previously. She is currently receiving carboplatin Herceptin and Perjeta. She has had intermittent loose stools were otherwise doing quite well. Denies any nausea vomiting. She does have fatigue and loss of appetite.  REVIEW OF SYSTEMS:   Constitutional: Denies fevers, chills or abnormal weight loss Eyes: Denies blurriness of vision Ears, nose, mouth, throat, and face: Denies mucositis or sore throat Respiratory: Denies cough, dyspnea or wheezes Cardiovascular: Denies palpitation, chest discomfort Gastrointestinal:  Denies nausea, heartburn or change in bowel habits Skin: Denies abnormal skin rashes Lymphatics: Denies new lymphadenopathy or easy bruising Neurological:Denies numbness, tingling or new weaknesses Behavioral/Psych: Mood is stable, no new changes    Extremities: No lower extremity edema  All other systems were reviewed with the patient and are negative.  I have reviewed the past medical history, past surgical history, social history and family history with the patient and they are unchanged from previous note.  ALLERGIES:  is allergic to ivp dye [iodinated diagnostic agents]; other; dilaudid [hydromorphone hcl]; asa [aspirin]; and codeine.  MEDICATIONS:  Current Outpatient Prescriptions  Medication Sig Dispense Refill  . dexamethasone (DECADRON) 4 MG tablet Take 1 tablet (4 mg total) by mouth daily. Take 1 tab day before and 1 tab day after chemo 15 tablet 0  . diphenoxylate-atropine (LOMOTIL) 2.5-0.025 MG tablet Take 2 tablets by mouth 4 (four) times daily as needed for diarrhea or loose stools. 45 tablet 0  . hydrocortisone (ANUSOL-HC) 2.5 % rectal cream Place 1 application rectally 2 (two) times daily. 30 g 3  . hydrocortisone (ANUSOL-HC) 25 MG suppository Place 1 suppository (25 mg total) rectally 2 (two) times daily. 12 suppository 0  . levothyroxine (SYNTHROID, LEVOTHROID) 125 MCG tablet Take 1 tablet (125 mcg total) by mouth daily before breakfast. 90 tablet 1  . lidocaine-prilocaine (EMLA) cream Apply to affected area once 30 g 3  . loperamide (IMODIUM) 2 MG capsule Take 2 mg by mouth as needed for diarrhea or loose stools.    Marland Kitchen LORazepam (ATIVAN) 0.5 MG tablet Take 1 tablet (0.5 mg total) by mouth at bedtime. As needed for sleep 30 tablet 0  . metroNIDAZOLE (FLAGYL) 500 MG tablet Take 1 tablet (500 mg total) by mouth 4 (four) times daily. 56 tablet 0  . ondansetron (ZOFRAN) 8 MG  tablet Take 1 tablet (8 mg total) by mouth 2 (two) times daily as needed for refractory nausea / vomiting. Start on day 3 after chemo. 30 tablet 1  . potassium chloride SA (K-DUR,KLOR-CON) 20 MEQ tablet Take 1 tablet (20 mEq total) by mouth daily. 30 tablet 0  . prochlorperazine (COMPAZINE) 10 MG tablet Take 1 tablet (10 mg total) by mouth every 6 (six)  hours as needed (Nausea or vomiting). 30 tablet 1   No current facility-administered medications for this visit.     PHYSICAL EXAMINATION: ECOG PERFORMANCE STATUS: 1 - Symptomatic but completely ambulatory  Vitals:   12/01/16 0946  BP: 140/74  Pulse: 74  Resp: 18  Temp: 97.9 F (36.6 C)   Filed Weights   12/01/16 0946  Weight: 159 lb 8 oz (72.3 kg)    GENERAL:alert, no distress and comfortable SKIN: skin color, texture, turgor are normal, no rashes or significant lesions EYES: normal, Conjunctiva are pink and non-injected, sclera clear OROPHARYNX:no exudate, no erythema and lips, buccal mucosa, and tongue normal  NECK: supple, thyroid normal size, non-tender, without nodularity LYMPH:  no palpable lymphadenopathy in the cervical, axillary or inguinal LUNGS: clear to auscultation and percussion with normal breathing effort HEART: regular rate & rhythm and no murmurs and no lower extremity edema ABDOMEN:abdomen soft, non-tender and normal bowel sounds MUSCULOSKELETAL:no cyanosis of digits and no clubbing  NEURO: alert & oriented x 3 with fluent speech, no focal motor/sensory deficits EXTREMITIES: No lower extremity edema  LABORATORY DATA:  I have reviewed the data as listed   Chemistry      Component Value Date/Time   NA 142 12/01/2016 0828   K 3.4 (L) 12/01/2016 0828   CL 107 08/14/2016 1605   CO2 25 12/01/2016 0828   BUN 11.6 12/01/2016 0828   CREATININE 0.6 12/01/2016 0828      Component Value Date/Time   CALCIUM 9.4 12/01/2016 0828   ALKPHOS 86 12/01/2016 0828   AST 17 12/01/2016 0828   ALT 17 12/01/2016 0828   BILITOT 0.47 12/01/2016 0828       Lab Results  Component Value Date   WBC 5.5 12/01/2016   HGB 11.7 12/01/2016   HCT 34.5 (L) 12/01/2016   MCV 102.8 (H) 12/01/2016   PLT 165 12/01/2016   NEUTROABS 3.7 12/01/2016    ASSESSMENT & PLAN:  Breast cancer of upper-outer quadrant of right female breast ([place]) Rt Biopsy 11oclock and 2 o clock: Grade  3 IDC with LVI; LN biopsy: Positive IDC; 11oclock: Er 100%, PR 90%, Ki 67: 90%; Her 2 Neg Heterogeneous; LN: Er 100%, PR: 90%; Ki67: 60%; Her 2 Pos Ratio 2.37; 2oclock: Er 100%, PR 90%; Ki 67: 90%; Her 2 Positive 2.52  Rt Breast 2 masses at 11-12:30 Position spanning 8 cm; Additional hihly susp mass at 2 o clock: 1.5 cm; LN with cort thickening 2.7 cm Breast MRI on 08/13/2016: 8 cm span of breast cancer with multiple enlarged axillary lymph nodes. CT CAP: 08/17/16: 4 mm right middle lobe pulmonary nodule unlikely to be metastatic disease Bone scan 08/17/2016: No evidence of metastatic disease  Recommendationbased on multidisciplinary tumor board: 1. Neoadjuvant chemotherapy with TCH Perjeta 6 cycles followed by Herceptin maintenance for 1 year 2. Followed by breast conserving surgery if possible with sentinel lymph node study 3. Followed by adjuvant radiation therapy 4. Followed by Anti-estrogen therapy -------------------------------------------------------------------------------------------------------------------------------------------------------- Current treatment:Cycle 6of neoadjuvant TCH Perjeta (Stopped Taxotere with cycle 3) Chemotherapy toxicities:  1. Nausea grade 1: Patient with around-the-clock Zofran  and Compazine and this helped her. 2. Fatigue 3. Loss of taste 4. 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 Discontinued Taxotere from cycle 3 5. Recurrent C. difficile enterocolitis: I prescribed her Flagyl 500 mg by mouth 4 times a day because a stool for C. difficile was positive once again.  She has been set up for breast MRI. I will follow her up after that the tumor board discussion. We'll make an appointment for her to see surgery again.   I spent 25 minutes talking to the patient of which more than half was spent in counseling and coordination of care.  No orders of the defined types were placed in this encounter.  The patient has a good understanding of the overall  plan. she agrees with it. she will call with any problems that may develop before the next visit here.   Rulon Eisenmenger, MD 12/01/16

## 2016-12-01 NOTE — Patient Instructions (Signed)
[facility] Discharge Instructions for Patients Receiving Chemotherapy  Today you received the following chemotherapy agents: Herceptin, Perjeta and Carboplatin   To help prevent nausea and vomiting after your treatment, we encourage you to take your nausea medication as directed.    If you develop nausea and vomiting that is not controlled by your nausea medication, call the clinic.   BELOW ARE SYMPTOMS THAT SHOULD BE REPORTED IMMEDIATELY:  *FEVER GREATER THAN 100.5 F  *CHILLS WITH OR WITHOUT FEVER  NAUSEA AND VOMITING THAT IS NOT CONTROLLED WITH YOUR NAUSEA MEDICATION  *UNUSUAL SHORTNESS OF BREATH  *UNUSUAL BRUISING OR BLEEDING  TENDERNESS IN MOUTH AND THROAT WITH OR WITHOUT PRESENCE OF ULCERS  *URINARY PROBLEMS  *BOWEL PROBLEMS  UNUSUAL RASH Items with * indicate a potential emergency and should be followed up as soon as possible.  Feel free to call the clinic you have any questions or concerns. The clinic phone number is [phone].  Please show the [place] at check-in to the Emergency Department and triage nurse.

## 2016-12-01 NOTE — Telephone Encounter (Signed)
sw pt to confirm ECHO on 4/30 at 11 am per sch msg

## 2016-12-01 NOTE — Progress Notes (Signed)
Per May, RN per Dr. Lindi Adie okay to treat today with ECHO from 08/16/16. Patient to have ECHO scheduled before next treatment.

## 2016-12-02 ENCOUNTER — Encounter: Payer: Self-pay | Admitting: Pharmacist

## 2016-12-02 DIAGNOSIS — C50411 Malignant neoplasm of upper-outer quadrant of right female breast: Secondary | ICD-10-CM

## 2016-12-02 DIAGNOSIS — Z170 Estrogen receptor positive status [ER+]: Secondary | ICD-10-CM

## 2016-12-02 NOTE — Progress Notes (Signed)
Consent documentation  Study code: [id number]  Met with patient during her infusion appointment on 12/01/16 at 10:15. Patient was alone for this visit. Provided an overview of the "Pharmacogenetic analysis of toxicities related to administration of taxanes in breast cancer patients" study.  Consent form was reviewed with the patient (reviewed the study purpose, patient's role, possible side effects, cost, risk, information protection) All of the patient's questions were answered and she agreed to participate in the study. A signed copy of the consent form was given to the patient. All eligibility criteria have been met and patient has been enrolled in the study.  Study sample was collected via buccal swab after consent was obtained. Patient was informed that the sample would be sent to the lab for processing after samples were collected from 25 patients and that it would take approximately 2 weeks for the lab to process that sample.   Met with the patient for approximately 15 minutes.  Darl Pikes, PharmD, [place] Clinical Pharmacist- Oncology Pharmacy Resident

## 2016-12-04 ENCOUNTER — Ambulatory Visit (HOSPITAL_COMMUNITY): Admission: RE | Admit: 2016-12-04 | Payer: BLUE CROSS/BLUE SHIELD | Source: Ambulatory Visit

## 2016-12-04 ENCOUNTER — Telehealth: Payer: Self-pay | Admitting: *Deleted

## 2016-12-04 NOTE — Telephone Encounter (Signed)
FYI "Mom received chemotherapy today.  She thought this was her last chemotherapy treatment and the next step is surgery.  She is crying and upset.  Believes she was told today she is to receive Herceptin and Perjetta for the next year.  Is this true?  She also did not tell me she was for an ECHO today and cancelled this procedure." This nurse apologized for any possible miscommunication to aid her understanding of treatment plan.  Herceptin and Perjetta are Monoclonal Antibodies that are used in combination with chemotherapy agents but are not chemotherapy.  She has completed chemotherapy but now will continue with these agents every three weeks.  Encouraged to call to have ECHO performed before the May 18 th appointment for Perjetta and Herceptin explaining these two agents can affect heart performance so ECHO will be needed every two to three months during the next year.  Son expressed thanks for explaining with no further questions.

## 2016-12-06 ENCOUNTER — Ambulatory Visit
Admission: RE | Admit: 2016-12-06 | Discharge: 2016-12-06 | Disposition: A | Discharge status: Home / Self Care | Payer: BLUE CROSS/BLUE SHIELD | Source: Ambulatory Visit | Attending: Hematology and Oncology | Admitting: Hematology and Oncology

## 2016-12-06 DIAGNOSIS — C50411 Malignant neoplasm of upper-outer quadrant of right female breast: Secondary | ICD-10-CM

## 2016-12-06 MED ORDER — GADOBENATE DIMEGLUMINE 529 MG/ML IV SOLN
15.0000 mL | Freq: Once | INTRAVENOUS | Status: AC | PRN
  Administered 2016-12-06: 14 mL via INTRAVENOUS

## 2016-12-11 ENCOUNTER — Other Ambulatory Visit: Payer: Self-pay | Admitting: General Surgery

## 2016-12-11 DIAGNOSIS — C50211 Malignant neoplasm of upper-inner quadrant of right female breast: Secondary | ICD-10-CM

## 2016-12-14 ENCOUNTER — Other Ambulatory Visit: Payer: Self-pay | Admitting: Pediatrics

## 2016-12-14 DIAGNOSIS — E039 Hypothyroidism, unspecified: Secondary | ICD-10-CM

## 2016-12-14 MED ORDER — LEVOTHYROXINE SODIUM 125 MCG PO TABS: 125.0000 ug | ORAL_TABLET | Freq: Every day | ORAL | 0 refills | Status: DC

## 2016-12-15 ENCOUNTER — Other Ambulatory Visit: Payer: BLUE CROSS/BLUE SHIELD

## 2016-12-21 NOTE — Assessment & Plan Note (Signed)
Rt Biopsy 11oclock and 2 o clock: Grade 3 IDC with LVI; LN biopsy: Positive IDC; 11oclock: Er 100%, PR 90%, Ki 67: 90%; Her 2 Neg Heterogeneous; LN: Er 100%, PR: 90%; Ki67: 60%; Her 2 Pos Ratio 2.37; 2oclock: Er 100%, PR 90%; Ki 67: 90%; Her 2 Positive 2.52  Rt Breast 2 masses at 11-12:30 Position spanning 8 cm; Additional hihly susp mass at 2 o clock: 1.5 cm; LN with cort thickening 2.7 cm Breast MRI on 08/13/2016: 8 cm span of breast cancer with multiple enlarged axillary lymph nodes. CT CAP: 08/17/16: 4 mm right middle lobe pulmonary nodule unlikely to be metastatic disease Bone scan 08/17/2016: No evidence of metastatic disease  Recommendationbased on multidisciplinary tumor board: 1. Neoadjuvant chemotherapy with TCH Perjeta 6 cycles followed by Herceptin maintenance for 1 year 08/16/16 to 12/01/16 2. Followed by breast conserving surgery if possible with sentinel lymph node study 3. Followed by adjuvant radiation therapy 4. Followed by Anti-estrogen therapy  MRI Breast 12/10/16: Residual right breast 2 o'clock enhancing mass which now measures 1.3 cm in greatest dimension. Residual non measurable scattered foci of heterogeneous enhancement throughout the superior and subareolar right breast, anterior and middle depth. Diminished right axillary lymphadenopathy  RTC after surgery Continue q 3 week Herceptin maintenance

## 2016-12-22 ENCOUNTER — Other Ambulatory Visit (HOSPITAL_BASED_OUTPATIENT_CLINIC_OR_DEPARTMENT_OTHER): Payer: BLUE CROSS/BLUE SHIELD

## 2016-12-22 ENCOUNTER — Ambulatory Visit: Payer: BLUE CROSS/BLUE SHIELD

## 2016-12-22 ENCOUNTER — Ambulatory Visit (HOSPITAL_BASED_OUTPATIENT_CLINIC_OR_DEPARTMENT_OTHER): Payer: BLUE CROSS/BLUE SHIELD

## 2016-12-22 ENCOUNTER — Encounter: Payer: Self-pay | Admitting: Hematology and Oncology

## 2016-12-22 ENCOUNTER — Ambulatory Visit (HOSPITAL_BASED_OUTPATIENT_CLINIC_OR_DEPARTMENT_OTHER): Payer: BLUE CROSS/BLUE SHIELD | Admitting: Hematology and Oncology

## 2016-12-22 DIAGNOSIS — C50411 Malignant neoplasm of upper-outer quadrant of right female breast: Secondary | ICD-10-CM

## 2016-12-22 DIAGNOSIS — Z5112 Encounter for antineoplastic immunotherapy: Secondary | ICD-10-CM | POA: Diagnosis not present

## 2016-12-22 DIAGNOSIS — Z170 Estrogen receptor positive status [ER+]: Principal | ICD-10-CM

## 2016-12-22 LAB — COMPREHENSIVE METABOLIC PANEL
ALT: 14 U/L (ref 0–55)
ANION GAP: 9 meq/L (ref 3–11)
AST: 16 U/L (ref 5–34)
Albumin: 3.6 g/dL (ref 3.5–5.0)
Alkaline Phosphatase: 89 U/L (ref 40–150)
BILIRUBIN TOTAL: 0.51 mg/dL (ref 0.20–1.20)
BUN: 10.1 mg/dL (ref 7.0–26.0)
CALCIUM: 9 mg/dL (ref 8.4–10.4)
CHLORIDE: 109 meq/L (ref 98–109)
CO2: 26 meq/L (ref 22–29)
Creatinine: 0.7 mg/dL (ref 0.6–1.1)
EGFR: >90 mL/min/{1.73_m2} (ref >90)
GLUCOSE: 91 mg/dL (ref 70–140)
Potassium: 3.9 mEq/L (ref 3.5–5.1)
Sodium: 144 mEq/L (ref 136–145)
TOTAL PROTEIN: 6.6 g/dL (ref 6.4–8.3)

## 2016-12-22 LAB — CBC WITH DIFFERENTIAL/PLATELET
BASO%: 0.3 % (ref 0.0–2.0)
Basophils Absolute: 0 10*3/uL (ref 0.0–0.1)
EOS%: 1.9 % (ref 0.0–7.0)
Eosinophils Absolute: 0.1 10*3/uL (ref 0.0–0.5)
HEMATOCRIT: 35.4 % (ref 34.8–46.6)
HGB: 11.7 g/dL (ref 11.6–15.9)
LYMPH%: 29.3 % (ref 14.0–49.7)
MCH: 35.1 pg — ABNORMAL HIGH (ref 25.1–34.0)
MCHC: 33.1 g/dL (ref 31.5–36.0)
MCV: 106.3 fL — ABNORMAL HIGH (ref 79.5–101.0)
MONO#: 0.3 10*3/uL (ref 0.1–0.9)
MONO%: 6.7 % (ref 0.0–14.0)
NEUT%: 61.8 % (ref 38.4–76.8)
NEUTROS ABS: 2.3 10*3/uL (ref 1.5–6.5)
PLATELETS: 142 10*3/uL — AB (ref 145–400)
RBC: 3.33 10*6/uL — AB (ref 3.70–5.45)
RDW: 15.7 % — ABNORMAL HIGH (ref 11.2–14.5)
WBC: 3.8 10*3/uL — AB (ref 3.9–10.3)
lymph#: 1.1 10*3/uL (ref 0.9–3.3)

## 2016-12-22 MED ORDER — TRASTUZUMAB CHEMO 150 MG IV SOLR: 6.0000 mg/kg | Freq: Once | INTRAVENOUS | Status: DC

## 2016-12-22 MED ORDER — ACETAMINOPHEN 325 MG PO TABS
ORAL_TABLET | ORAL | Status: AC
  Filled 2016-12-22: qty 2

## 2016-12-22 MED ORDER — HEPARIN SOD (PORK) LOCK FLUSH 100 UNIT/ML IV SOLN
500.0000 [IU] | Freq: Once | INTRAVENOUS | Status: AC | PRN
  Administered 2016-12-22: 500 [IU]
  Filled 2016-12-22: qty 5

## 2016-12-22 MED ORDER — TRASTUZUMAB CHEMO 150 MG IV SOLR
450.0000 mg | Freq: Once | INTRAVENOUS | Status: AC
  Administered 2016-12-22: 450 mg via INTRAVENOUS
  Filled 2016-12-22: qty 21.43

## 2016-12-22 MED ORDER — DIPHENHYDRAMINE HCL 25 MG PO CAPS: 50.0000 mg | ORAL_CAPSULE | Freq: Once | ORAL | Status: DC

## 2016-12-22 MED ORDER — SODIUM CHLORIDE 0.9 % IV SOLN
Freq: Once | INTRAVENOUS | Status: AC
  Administered 2016-12-22: 10:00:00 via INTRAVENOUS

## 2016-12-22 MED ORDER — SODIUM CHLORIDE 0.9 % IV SOLN
420.0000 mg | Freq: Once | INTRAVENOUS | Status: AC
  Administered 2016-12-22: 420 mg via INTRAVENOUS
  Filled 2016-12-22: qty 14

## 2016-12-22 MED ORDER — ACETAMINOPHEN 325 MG PO TABS
650.0000 mg | ORAL_TABLET | Freq: Once | ORAL | Status: AC
  Administered 2016-12-22: 650 mg via ORAL

## 2016-12-22 MED ORDER — SODIUM CHLORIDE 0.9% FLUSH
10.0000 mL | INTRAVENOUS | Status: DC | PRN
  Administered 2016-12-22: 10 mL
  Filled 2016-12-22: qty 10

## 2016-12-22 NOTE — Progress Notes (Addendum)
Patient Care Team: [name], MD as PCP - General (Pediatrics)  DIAGNOSIS:  Encounter Diagnosis  Name Primary?  . Malignant neoplasm of upper-outer quadrant of right breast in female, estrogen receptor positive ([place])     SUMMARY OF ONCOLOGIC HISTORY:   Breast cancer of upper-outer quadrant of right female breast ([place])   07/24/2016 Mammogram    Rt Breast 2 masses at 11-12:30 Position spanning 8 cm; Additional mass at 2 o clock: 1.5 cm; LN with cort thickening 2.7 cm      07/27/2016 Initial Diagnosis    Rt Biopsy 11oclock and 2 o clock: Grade 3 IDC with LVI; LN biopsy: Positive IDC; 11oclock: Er 100%, PR 90%, Ki 67: 90%; Her 2 Neg Heterogeneous; LN: Er 100%, PR: 90%; Ki67: 60%; Her 2 Pos Ratio 2.37; 2oclock: Er 100%, PR 90%; Ki 67: 90%; Her 2 Positive 2.52      08/16/2016 - 12/01/2016 Neo-Adjuvant Chemotherapy    Neoadjuvant chemotherapy with TCH Perjeta (Taxotere d/ced after 2 cycles)      12/06/2016 Breast MRI    Residual right breast 2 o'clock enhancing mass which now measures 1.3 cm in greatest dimension.Residual non measurable scattered foci of heterogeneous enhancement throughout the superior and subareolar right breast, anterior and middle depth. Diminished right axillary lymphadenopathy       CHIEF COMPLIANT: Follow-up to discuss the recently performed breast MRI after conclusion of neoadjuvant chemotherapy  INTERVAL HISTORY: Cynthia Chavez is a 61 year old with above-mentioned history of right breast cancer HER-2 positive disease who underwent neoadjuvant chemotherapy with TCH Perjeta. She is here to review the results of the breast MRI performed on 12/06/2016. MRI revealed a residual right breast enhancing mass 1.3 cm with scattered foci of heterogeneous enhancement. Right axillary lymph nodes diminished.  REVIEW OF SYSTEMS:   Constitutional: Denies fevers, chills or abnormal weight loss, complains of fatigue Eyes: Denies blurriness of vision Ears, nose, mouth,  throat, and face: Denies mucositis or sore throat Respiratory: Denies cough, dyspnea or wheezes Cardiovascular: Denies palpitation, chest discomfort Gastrointestinal:  Denies nausea, heartburn or change in bowel habits Skin: Denies abnormal skin rashes Lymphatics: Denies new lymphadenopathy or easy bruising Neurological: Mild peripheral neuropathy Behavioral/Psych: Mood is stable, no new changes  Extremities: No lower extremity edema  All other systems were reviewed with the patient and are negative.  I have reviewed the past medical history, past surgical history, social history and family history with the patient and they are unchanged from previous note.  ALLERGIES:  is allergic to ivp dye [iodinated diagnostic agents]; other; dilaudid [hydromorphone hcl]; asa [aspirin]; and codeine.  MEDICATIONS:  Current Outpatient Prescriptions  Medication Sig Dispense Refill  . dexamethasone (DECADRON) 4 MG tablet Take 1 tablet (4 mg total) by mouth daily. Take 1 tab day before and 1 tab day after chemo 15 tablet 0  . diphenoxylate-atropine (LOMOTIL) 2.5-0.025 MG tablet Take 2 tablets by mouth 4 (four) times daily as needed for diarrhea or loose stools. 45 tablet 0  . hydrocortisone (ANUSOL-HC) 2.5 % rectal cream Place 1 application rectally 2 (two) times daily. 30 g 3  . hydrocortisone (ANUSOL-HC) 25 MG suppository Place 1 suppository (25 mg total) rectally 2 (two) times daily. 12 suppository 0  . levothyroxine (SYNTHROID, LEVOTHROID) 125 MCG tablet Take 1 tablet (125 mcg total) by mouth daily before breakfast. 90 tablet 0  . lidocaine-prilocaine (EMLA) cream Apply to affected area once 30 g 3  . loperamide (IMODIUM) 2 MG capsule Take 2 mg by mouth as needed  for diarrhea or loose stools.    Marland Kitchen LORazepam (ATIVAN) 0.5 MG tablet Take 1 tablet (0.5 mg total) by mouth at bedtime. As needed for sleep 30 tablet 0  . metroNIDAZOLE (FLAGYL) 500 MG tablet Take 1 tablet (500 mg total) by mouth 4 (four) times  daily. 56 tablet 0  . ondansetron (ZOFRAN) 8 MG tablet Take 1 tablet (8 mg total) by mouth 2 (two) times daily as needed for refractory nausea / vomiting. Start on day 3 after chemo. 30 tablet 1  . potassium chloride SA (K-DUR,KLOR-CON) 20 MEQ tablet Take 1 tablet (20 mEq total) by mouth daily. 30 tablet 0  . prochlorperazine (COMPAZINE) 10 MG tablet Take 1 tablet (10 mg total) by mouth every 6 (six) hours as needed (Nausea or vomiting). 30 tablet 1   No current facility-administered medications for this visit.    Facility-Administered Medications Ordered in Other Visits  Medication Dose Route Frequency Provider Last Rate Last Dose  . diphenhydrAMINE (BENADRYL) capsule 50 mg  50 mg Oral Once Nicholas Lose, MD      . heparin lock flush 100 unit/mL  500 Units Intracatheter Once PRN Nicholas Lose, MD      . pertuzumab (PERJETA) 420 mg in sodium chloride 0.9 % 250 mL chemo infusion  420 mg Intravenous Once Nicholas Lose, MD      . sodium chloride flush (NS) 0.9 % injection 10 mL  10 mL Intracatheter PRN Nicholas Lose, MD      . trastuzumab (HERCEPTIN) 450 mg in sodium chloride 0.9 % 250 mL chemo infusion  450 mg Intravenous Once Nicholas Lose, MD        PHYSICAL EXAMINATION: ECOG PERFORMANCE STATUS: 1 - Symptomatic but completely ambulatory  Vitals:   12/22/16 0929  BP: 136/80  Pulse: 64  Resp: 19  Temp: 97.5 F (36.4 C)   Filed Weights   12/22/16 0929  Weight: 157 lb 1.6 oz (71.3 kg)    GENERAL:alert, no distress and comfortable SKIN: skin color, texture, turgor are normal, no rashes or significant lesions EYES: normal, Conjunctiva are pink and non-injected, sclera clear OROPHARYNX:no exudate, no erythema and lips, buccal mucosa, and tongue normal  NECK: supple, thyroid normal size, non-tender, without nodularity LYMPH:  no palpable lymphadenopathy in the cervical, axillary or inguinal LUNGS: clear to auscultation and percussion with normal breathing effort HEART: regular rate &  rhythm and no murmurs and no lower extremity edema ABDOMEN:abdomen soft, non-tender and normal bowel sounds MUSCULOSKELETAL:no cyanosis of digits and no clubbing  NEURO: alert & oriented x 3 with fluent speech, mild peripheral neuropathy EXTREMITIES: No lower extremity edema  LABORATORY DATA:  I have reviewed the data as listed   Chemistry      Component Value Date/Time   NA 144 12/22/2016 0854   K 3.9 12/22/2016 0854   CL 107 08/14/2016 1605   CO2 26 12/22/2016 0854   BUN 10.1 12/22/2016 0854   CREATININE 0.7 12/22/2016 0854      Component Value Date/Time   CALCIUM 9.0 12/22/2016 0854   ALKPHOS 89 12/22/2016 0854   AST 16 12/22/2016 0854   ALT 14 12/22/2016 0854   BILITOT 0.51 12/22/2016 0854       Lab Results  Component Value Date   WBC 3.8 (L) 12/22/2016   HGB 11.7 12/22/2016   HCT 35.4 12/22/2016   MCV 106.3 (H) 12/22/2016   PLT 142 (L) 12/22/2016   NEUTROABS 2.3 12/22/2016    ASSESSMENT & PLAN:  Breast cancer of upper-outer  quadrant of right female breast ([place]) Rt Biopsy 11oclock and 2 o clock: Grade 3 IDC with LVI; LN biopsy: Positive IDC; 11oclock: Er 100%, PR 90%, Ki 67: 90%; Her 2 Neg Heterogeneous; LN: Er 100%, PR: 90%; Ki67: 60%; Her 2 Pos Ratio 2.37; 2oclock: Er 100%, PR 90%; Ki 67: 90%; Her 2 Positive 2.52  Rt Breast 2 masses at 11-12:30 Position spanning 8 cm; Additional hihly susp mass at 2 o clock: 1.5 cm; LN with cort thickening 2.7 cm Breast MRI on 08/13/2016: 8 cm span of breast cancer with multiple enlarged axillary lymph nodes. CT CAP: 08/17/16: 4 mm right middle lobe pulmonary nodule unlikely to be metastatic disease Bone scan 08/17/2016: No evidence of metastatic disease  Recommendationbased on multidisciplinary tumor board: 1. Neoadjuvant chemotherapy with TCH Perjeta 6 cycles followed by Herceptin maintenance for 1 year 08/16/16 to 12/01/16 2. Followed by breast conserving surgery versus mastectomy with targeted axillary node dissection 3.  Followed by adjuvant radiation therapy 4. Followed by Anti-estrogen therapy  MRI Breast 12/10/16: Residual right breast 2 o'clock enhancing mass which now measures 1.3 cm in greatest dimension. Residual non measurable scattered foci of heterogeneous enhancement throughout the superior and subareolar right breast, anterior and middle depth. Diminished right axillary lymphadenopathy We will be presenting her case in the breast tumor board to discuss her surgical options. Patient had a remarkable improvement in the breast cancer with a decrease in size from 8 cm down to 1.3 cm. However I still believe that she has residual cancer and she might need a mastectomy. However she would be a candidate for targeted axillary node dissection given the excellent improvement in the lymph nodes  RTC after surgery Continue q 3 week Herceptin maintenance  I spent 25 minutes talking to the patient of which more than half was spent in counseling and coordination of care.  No orders of the defined types were placed in this encounter.  The patient has a good understanding of the overall plan. she agrees with it. she will call with any problems that may develop before the next visit here.   Rulon Eisenmenger, MD 12/22/16  Addendum: Patient is receiving both Herceptin and Perjeta maintenance

## 2016-12-22 NOTE — Patient Instructions (Signed)
Implanted Port Insertion, Care After °This sheet gives you information about how to care for yourself after your procedure. Your health care provider may also give you more specific instructions. If you have problems or questions, contact your health care provider. °What can I expect after the procedure? °After your procedure, it is common to have: °· Discomfort at the port insertion site. °· Bruising on the skin over the port. This should improve over 3-4 days. ° °Follow these instructions at home: °Port care °· After your port is placed, you will get a manufacturer's information card. The card has information about your port. Keep this card with you at all times. °· Take care of the port as told by your health care provider. Ask your health care provider if you or a family member can get training for taking care of the port at home. A home health care nurse may also take care of the port. °· Make sure to remember what type of port you have. °Incision care °· Follow instructions from your health care provider about how to take care of your port insertion site. Make sure you: °? Wash your hands with soap and water before you change your bandage (dressing). If soap and water are not available, use hand sanitizer. °? Change your dressing as told by your health care provider. °? Leave stitches (sutures), skin glue, or adhesive strips in place. These skin closures may need to stay in place for 2 weeks or longer. If adhesive strip edges start to loosen and curl up, you may trim the loose edges. Do not remove adhesive strips completely unless your health care provider tells you to do that. °· Check your port insertion site every day for signs of infection. Check for: °? More redness, swelling, or pain. °? More fluid or blood. °? Warmth. °? Pus or a bad smell. °General instructions °· Do not take baths, swim, or use a hot tub until your health care provider approves. °· Do not lift anything that is heavier than 10 lb (4.5  kg) for a week, or as told by your health care provider. °· Ask your health care provider when it is okay to: °? Return to work or school. °? Resume usual physical activities or sports. °· Do not drive for 24 hours if you were given a medicine to help you relax (sedative). °· Take over-the-counter and prescription medicines only as told by your health care provider. °· Wear a medical alert bracelet in case of an emergency. This will tell any health care providers that you have a port. °· Keep all follow-up visits as told by your health care provider. This is important. °Contact a health care provider if: °· You cannot flush your port with saline as directed, or you cannot draw blood from the port. °· You have a fever or chills. °· You have more redness, swelling, or pain around your port insertion site. °· You have more fluid or blood coming from your port insertion site. °· Your port insertion site feels warm to the touch. °· You have pus or a bad smell coming from the port insertion site. °Get help right away if: °· You have chest pain or shortness of breath. °· You have bleeding from your port that you cannot control. °Summary °· Take care of the port as told by your health care provider. °· Change your dressing as told by your health care provider. °· Keep all follow-up visits as told by your health care provider. °  This information is not intended to replace advice given to you by your health care provider. Make sure you discuss any questions you have with your health care provider. °Document Released: 05/14/2013 Document Revised: 06/14/2016 Document Reviewed: 06/14/2016 °Elsevier Interactive Patient Education © 2017 [employer] Inc. ° °

## 2016-12-22 NOTE — Patient Instructions (Addendum)
[facility] Discharge Instructions for Patients Receiving Chemotherapy  Today you received the following chemotherapy agents Herceptin/Perjeta  To help prevent nausea and vomiting after your treatment, we encourage you to take your nausea medication    If you develop nausea and vomiting that is not controlled by your nausea medication, call the clinic.   BELOW ARE SYMPTOMS THAT SHOULD BE REPORTED IMMEDIATELY:  *FEVER GREATER THAN 100.5 F  *CHILLS WITH OR WITHOUT FEVER  NAUSEA AND VOMITING THAT IS NOT CONTROLLED WITH YOUR NAUSEA MEDICATION  *UNUSUAL SHORTNESS OF BREATH  *UNUSUAL BRUISING OR BLEEDING  TENDERNESS IN MOUTH AND THROAT WITH OR WITHOUT PRESENCE OF ULCERS  *URINARY PROBLEMS  *BOWEL PROBLEMS  UNUSUAL RASH Items with * indicate a potential emergency and should be followed up as soon as possible.  Feel free to call the clinic you have any questions or concerns. The clinic phone number is [phone].  Please show the CHEMO ALERT CARD at check-in to the Emergency Department and triage nurse.   

## 2016-12-25 ENCOUNTER — Telehealth: Payer: Self-pay

## 2016-12-25 NOTE — Telephone Encounter (Signed)
Pt called for the prior authorization number for her last MRI and her last chemotherapy.  Her PA# for the MRI is [id number] lvm with [employer] for her chemo authorization number  lvm with pt to call us back for these numbers.

## 2016-12-25 NOTE — Telephone Encounter (Signed)
Pt did get her MRI PA number. Still waiting for chemo auth number. She is getting letter from insurance that the last MRI was not authorized and the last chemo was not either.  Pt was very nauseated and had to go to bed Saturday and Sunday after herceptin/perjeta on friday. Today not nauseated but still weak. Had some diarrhea this AM x1.

## 2016-12-26 NOTE — Telephone Encounter (Signed)
Sent inbasket to [employer] this morning to call pt with appropriate chemo auth requested.

## 2016-12-27 ENCOUNTER — Other Ambulatory Visit: Payer: Self-pay | Admitting: General Surgery

## 2016-12-27 ENCOUNTER — Encounter: Payer: Self-pay | Admitting: Radiation Oncology

## 2016-12-27 DIAGNOSIS — C50211 Malignant neoplasm of upper-inner quadrant of right female breast: Secondary | ICD-10-CM

## 2016-12-28 NOTE — Pre-Procedure Instructions (Addendum)
Cynthia Chavez  12/28/2016      [facility] [address] [place] [address] [place] [address] Phone: [phone] Fax: [phone]    Your procedure is scheduled on May 30  Report to [facility] [place] Admitting at 1:00  P.M.  Call this number if you have problems the morning of surgery:  [phone]   Remember:  Do not eat food or drink liquids after midnight except follow the following instructions   Please complete your 8oz of Boost Breeze or Water that was given to you at your preadmission appointment by 1100am on the day of your surgery    Take these medicines the morning of surgery with A SIP OF WATER levothyroxine (SYNTHROID, LEVOTHROID), omeprazole (PRILOSEC),   7 days prior to surgery STOP taking any Aspirin, Aleve, Naproxen, Ibuprofen, Motrin, Advil, Goody's, BC's, all herbal medications, fish oil, and all vitamins    Do not wear jewelry, make-up or nail polish.  Do not wear lotions, powders, or perfumes, or deoderant.  Men may shave face and neck.  Do not bring valuables to the hospital.  [facility] is not responsible for any belongings or valuables.  Contacts, dentures or bridgework may not be worn into surgery.  Leave your suitcase in the car.  After surgery it may be brought to your room.  For patients admitted to the hospital, discharge time will be determined by your treatment team.  Patients discharged the day of surgery will not be allowed to drive home.    Special instructions:   - Preparing For Surgery  Before surgery, you can play an important role. Because skin is not sterile, your skin needs to be as free of germs as possible. You can reduce the number of germs on your skin by washing with CHG (chlorahexidine gluconate) Soap before surgery.  CHG is an antiseptic cleaner which kills germs and bonds with the skin to continue killing germs even after washing.  Please do not use if you have an allergy  to CHG or antibacterial soaps. If your skin becomes reddened/irritated stop using the CHG.  Do not shave (including legs and underarms) for at least 48 hours prior to first CHG shower. It is OK to shave your face.  Please follow these instructions carefully.   1. Shower the NIGHT BEFORE SURGERY and the MORNING OF SURGERY with CHG.   2. If you chose to wash your hair, wash your hair first as usual with your normal shampoo.  3. After you shampoo, rinse your hair and body thoroughly to remove the shampoo.  4. Use CHG as you would any other liquid soap. You can apply CHG directly to the skin and wash gently with a scrungie or a clean washcloth.   5. Apply the CHG Soap to your body ONLY FROM THE NECK DOWN.  Do not use on open wounds or open sores. Avoid contact with your eyes, ears, mouth and genitals (private parts). Wash genitals (private parts) with your normal soap.  6. Wash thoroughly, paying special attention to the area where your surgery will be performed.  7. Thoroughly rinse your body with warm water from the neck down.  8. DO NOT shower/wash with your normal soap after using and rinsing off the CHG Soap.  9. Pat yourself dry with a CLEAN TOWEL.   10. Wear CLEAN PAJAMAS   11. Place CLEAN SHEETS on your bed the night of your first shower and DO  NOT SLEEP WITH PETS.    Day of Surgery: Do not apply any deodorants/lotions. Please wear clean clothes to the hospital/surgery center.      Please read over the following fact sheets that you were given.

## 2016-12-29 ENCOUNTER — Other Ambulatory Visit: Payer: Self-pay | Admitting: General Surgery

## 2016-12-29 ENCOUNTER — Encounter (HOSPITAL_COMMUNITY)
Admission: RE | Admit: 2016-12-29 | Discharge: 2016-12-29 | Disposition: A | Discharge status: Home / Self Care | Payer: BLUE CROSS/BLUE SHIELD | Source: Ambulatory Visit | Attending: General Surgery | Admitting: General Surgery

## 2016-12-29 ENCOUNTER — Encounter (HOSPITAL_COMMUNITY): Payer: Self-pay

## 2016-12-29 ENCOUNTER — Ambulatory Visit
Admission: RE | Admit: 2016-12-29 | Discharge: 2016-12-29 | Disposition: A | Discharge status: Home / Self Care | Payer: BLUE CROSS/BLUE SHIELD | Source: Ambulatory Visit | Attending: General Surgery | Admitting: General Surgery

## 2016-12-29 DIAGNOSIS — C50211 Malignant neoplasm of upper-inner quadrant of right female breast: Secondary | ICD-10-CM

## 2016-12-29 DIAGNOSIS — C50411 Malignant neoplasm of upper-outer quadrant of right female breast: Secondary | ICD-10-CM | POA: Diagnosis not present

## 2016-12-29 DIAGNOSIS — Z01812 Encounter for preprocedural laboratory examination: Secondary | ICD-10-CM | POA: Insufficient documentation

## 2016-12-29 HISTORY — DX: Personal history of other diseases of the digestive system: Z87.19

## 2016-12-29 HISTORY — DX: Gastro-esophageal reflux disease without esophagitis: K21.9

## 2016-12-29 NOTE — Progress Notes (Signed)
PCP - Ass[place] Found Cardiologist - denies  Chest x-ray - not needed EKG - not needed Stress Test - denies ECHO - 08/16/16 Cardiac Cath - denies    Patient denies shortness of breath, fever, cough and chest pain at PAT appointment   Patient verbalized understanding of instructions that were given to them at the PAT appointment. Patient was also instructed that they will need to review over the PAT instructions again at home before surgery.

## 2017-01-03 ENCOUNTER — Ambulatory Visit (HOSPITAL_COMMUNITY): Payer: BLUE CROSS/BLUE SHIELD | Admitting: Anesthesiology

## 2017-01-03 ENCOUNTER — Ambulatory Visit
Admission: RE | Admit: 2017-01-03 | Discharge: 2017-01-03 | Disposition: A | Discharge status: Home / Self Care | Payer: BLUE CROSS/BLUE SHIELD | Source: Ambulatory Visit | Attending: General Surgery | Admitting: General Surgery

## 2017-01-03 ENCOUNTER — Ambulatory Visit (HOSPITAL_COMMUNITY): Payer: BLUE CROSS/BLUE SHIELD

## 2017-01-03 ENCOUNTER — Observation Stay (HOSPITAL_COMMUNITY)
Admission: RE | Admit: 2017-01-03 | Discharge: 2017-01-04 | Disposition: A | Discharge status: Home / Self Care | Payer: BLUE CROSS/BLUE SHIELD | Source: Ambulatory Visit | Attending: General Surgery | Admitting: General Surgery

## 2017-01-03 ENCOUNTER — Encounter (HOSPITAL_COMMUNITY): Payer: Self-pay | Admitting: Certified Registered Nurse Anesthetist

## 2017-01-03 ENCOUNTER — Encounter (HOSPITAL_COMMUNITY)
Admission: RE | Disposition: A | Discharge status: Home / Self Care | Payer: Self-pay | Source: Ambulatory Visit | Attending: General Surgery

## 2017-01-03 ENCOUNTER — Encounter (HOSPITAL_COMMUNITY)
Admission: RE | Admit: 2017-01-03 | Discharge: 2017-01-03 | Disposition: A | Discharge status: Home / Self Care | Payer: BLUE CROSS/BLUE SHIELD | Source: Ambulatory Visit | Attending: General Surgery | Admitting: General Surgery

## 2017-01-03 DIAGNOSIS — Z888 Allergy status to other drugs, medicaments and biological substances status: Secondary | ICD-10-CM | POA: Insufficient documentation

## 2017-01-03 DIAGNOSIS — Z886 Allergy status to analgesic agent status: Secondary | ICD-10-CM | POA: Insufficient documentation

## 2017-01-03 DIAGNOSIS — Z885 Allergy status to narcotic agent status: Secondary | ICD-10-CM | POA: Diagnosis not present

## 2017-01-03 DIAGNOSIS — Z79899 Other long term (current) drug therapy: Secondary | ICD-10-CM | POA: Diagnosis not present

## 2017-01-03 DIAGNOSIS — C50211 Malignant neoplasm of upper-inner quadrant of right female breast: Secondary | ICD-10-CM

## 2017-01-03 DIAGNOSIS — K219 Gastro-esophageal reflux disease without esophagitis: Secondary | ICD-10-CM | POA: Diagnosis not present

## 2017-01-03 DIAGNOSIS — C50411 Malignant neoplasm of upper-outer quadrant of right female breast: Secondary | ICD-10-CM | POA: Diagnosis not present

## 2017-01-03 DIAGNOSIS — E079 Disorder of thyroid, unspecified: Secondary | ICD-10-CM | POA: Insufficient documentation

## 2017-01-03 DIAGNOSIS — C773 Secondary and unspecified malignant neoplasm of axilla and upper limb lymph nodes: Secondary | ICD-10-CM | POA: Diagnosis not present

## 2017-01-03 DIAGNOSIS — Z9221 Personal history of antineoplastic chemotherapy: Secondary | ICD-10-CM | POA: Diagnosis not present

## 2017-01-03 DIAGNOSIS — Z803 Family history of malignant neoplasm of breast: Secondary | ICD-10-CM | POA: Insufficient documentation

## 2017-01-03 DIAGNOSIS — F17200 Nicotine dependence, unspecified, uncomplicated: Secondary | ICD-10-CM | POA: Diagnosis not present

## 2017-01-03 DIAGNOSIS — C50911 Malignant neoplasm of unspecified site of right female breast: Secondary | ICD-10-CM | POA: Diagnosis present

## 2017-01-03 HISTORY — DX: Major depressive disorder, single episode, unspecified: F32.9

## 2017-01-03 HISTORY — PX: MASTECTOMY W/ SENTINEL NODE BIOPSY: SHX2001

## 2017-01-03 HISTORY — DX: Depression, unspecified: F32.A

## 2017-01-03 HISTORY — DX: Malignant (primary) neoplasm, unspecified: C80.1

## 2017-01-03 HISTORY — PX: AXILLARY LYMPH NODE BIOPSY: SHX5737

## 2017-01-03 SURGERY — MASTECTOMY WITH SENTINEL LYMPH NODE BIOPSY
Anesthesia: General | Site: Breast | Laterality: Right

## 2017-01-03 MED ORDER — ENOXAPARIN SODIUM 40 MG/0.4ML ~~LOC~~ SOLN: 40.0000 mg | SUBCUTANEOUS | Status: DC

## 2017-01-03 MED ORDER — MORPHINE SULFATE (PF) 4 MG/ML IV SOLN: 1.0000 mg | INTRAVENOUS | Status: DC | PRN

## 2017-01-03 MED ORDER — PANTOPRAZOLE SODIUM 40 MG PO TBEC
40.0000 mg | DELAYED_RELEASE_TABLET | Freq: Every day | ORAL | Status: DC
  Administered 2017-01-03 – 2017-01-04 (×2): 40 mg via ORAL
  Filled 2017-01-03 (×2): qty 1

## 2017-01-03 MED ORDER — MIDAZOLAM HCL 2 MG/2ML IJ SOLN
INTRAMUSCULAR | Status: AC
  Administered 2017-01-03: 1 mg
  Filled 2017-01-03: qty 2

## 2017-01-03 MED ORDER — ONDANSETRON 4 MG PO TBDP: 4.0000 mg | ORAL_TABLET | Freq: Four times a day (QID) | ORAL | Status: DC | PRN

## 2017-01-03 MED ORDER — PROMETHAZINE HCL 25 MG/ML IJ SOLN: 6.2500 mg | INTRAMUSCULAR | Status: DC | PRN

## 2017-01-03 MED ORDER — SODIUM CHLORIDE 0.9 % IJ SOLN
INTRAMUSCULAR | Status: AC
  Filled 2017-01-03: qty 10

## 2017-01-03 MED ORDER — ONDANSETRON HCL 4 MG/2ML IJ SOLN
INTRAMUSCULAR | Status: DC | PRN
  Administered 2017-01-03: 4 mg via INTRAVENOUS

## 2017-01-03 MED ORDER — CEFAZOLIN SODIUM-DEXTROSE 2-4 GM/100ML-% IV SOLN
2.0000 g | INTRAVENOUS | Status: AC
  Administered 2017-01-03: 2 g via INTRAVENOUS

## 2017-01-03 MED ORDER — LACTATED RINGERS IV SOLN
INTRAVENOUS | Status: DC
  Administered 2017-01-03: 12:00:00 via INTRAVENOUS

## 2017-01-03 MED ORDER — ONDANSETRON HCL 4 MG/2ML IJ SOLN
INTRAMUSCULAR | Status: AC
  Filled 2017-01-03: qty 6

## 2017-01-03 MED ORDER — METHYLENE BLUE 0.5 % INJ SOLN
INTRAVENOUS | Status: AC
  Filled 2017-01-03: qty 10

## 2017-01-03 MED ORDER — TECHNETIUM TC 99M SULFUR COLLOID FILTERED
1.0000 | Freq: Once | INTRAVENOUS | Status: AC | PRN
  Administered 2017-01-03: 1 via INTRADERMAL

## 2017-01-03 MED ORDER — PHENYLEPHRINE HCL 10 MG/ML IJ SOLN
INTRAVENOUS | Status: DC | PRN
  Administered 2017-01-03: 30 ug/min via INTRAVENOUS

## 2017-01-03 MED ORDER — LIDOCAINE HCL (CARDIAC) 20 MG/ML IV SOLN
INTRAVENOUS | Status: DC | PRN
  Administered 2017-01-03: 20 mg via INTRATRACHEAL

## 2017-01-03 MED ORDER — MIDAZOLAM HCL 2 MG/2ML IJ SOLN
INTRAMUSCULAR | Status: DC | PRN
Start: 2017-01-03 — End: 2017-01-03
  Administered 2017-01-03: 1 mg via INTRAVENOUS

## 2017-01-03 MED ORDER — FENTANYL CITRATE (PF) 100 MCG/2ML IJ SOLN
INTRAMUSCULAR | Status: AC
  Filled 2017-01-03: qty 2

## 2017-01-03 MED ORDER — HEMOSTATIC AGENTS (NO CHARGE) OPTIME
TOPICAL | Status: DC | PRN
  Administered 2017-01-03: 1 via TOPICAL

## 2017-01-03 MED ORDER — MORPHINE SULFATE (PF) 2 MG/ML IV SOLN: 2.0000 mg | INTRAVENOUS | Status: DC | PRN

## 2017-01-03 MED ORDER — PROPOFOL 10 MG/ML IV BOLUS
INTRAVENOUS | Status: AC
  Filled 2017-01-03: qty 20

## 2017-01-03 MED ORDER — FENTANYL CITRATE (PF) 250 MCG/5ML IJ SOLN
INTRAMUSCULAR | Status: AC
  Filled 2017-01-03: qty 5

## 2017-01-03 MED ORDER — MIDAZOLAM HCL 2 MG/2ML IJ SOLN
INTRAMUSCULAR | Status: AC
  Filled 2017-01-03: qty 2

## 2017-01-03 MED ORDER — ACETAMINOPHEN 650 MG RE SUPP: 650.0000 mg | Freq: Four times a day (QID) | RECTAL | Status: DC | PRN

## 2017-01-03 MED ORDER — GABAPENTIN 300 MG PO CAPS: 300.0000 mg | ORAL_CAPSULE | ORAL | Status: DC

## 2017-01-03 MED ORDER — ONDANSETRON HCL 4 MG/2ML IJ SOLN: 4.0000 mg | Freq: Four times a day (QID) | INTRAMUSCULAR | Status: DC | PRN

## 2017-01-03 MED ORDER — LIDOCAINE 2% (20 MG/ML) 5 ML SYRINGE
INTRAMUSCULAR | Status: AC
  Filled 2017-01-03: qty 5

## 2017-01-03 MED ORDER — CEFAZOLIN SODIUM-DEXTROSE 2-4 GM/100ML-% IV SOLN
INTRAVENOUS | Status: AC
Start: 2017-01-03 — End: 2017-01-03
  Filled 2017-01-03: qty 100

## 2017-01-03 MED ORDER — SIMETHICONE 80 MG PO CHEW: 40.0000 mg | CHEWABLE_TABLET | Freq: Four times a day (QID) | ORAL | Status: DC | PRN

## 2017-01-03 MED ORDER — ACETAMINOPHEN 325 MG PO TABS
650.0000 mg | ORAL_TABLET | Freq: Four times a day (QID) | ORAL | Status: DC | PRN
  Administered 2017-01-04: 650 mg via ORAL
  Filled 2017-01-03: qty 2

## 2017-01-03 MED ORDER — BUPIVACAINE-EPINEPHRINE (PF) 0.5% -1:200000 IJ SOLN
INTRAMUSCULAR | Status: DC | PRN
  Administered 2017-01-03: 25 mL

## 2017-01-03 MED ORDER — SODIUM CHLORIDE 0.9 % IJ SOLN
INTRAVENOUS | Status: DC | PRN
  Administered 2017-01-03: 5 mL via INTRAMUSCULAR

## 2017-01-03 MED ORDER — FENTANYL CITRATE (PF) 100 MCG/2ML IJ SOLN
INTRAMUSCULAR | Status: AC
  Administered 2017-01-03: 50 ug
  Filled 2017-01-03: qty 2

## 2017-01-03 MED ORDER — FENTANYL CITRATE (PF) 100 MCG/2ML IJ SOLN
25.0000 ug | INTRAMUSCULAR | Status: DC | PRN
  Administered 2017-01-03: 25 ug via INTRAVENOUS

## 2017-01-03 MED ORDER — METHOCARBAMOL 500 MG PO TABS: 500.0000 mg | ORAL_TABLET | Freq: Four times a day (QID) | ORAL | Status: DC | PRN

## 2017-01-03 MED ORDER — SODIUM CHLORIDE 0.9 % IV SOLN
INTRAVENOUS | Status: DC
  Administered 2017-01-03 – 2017-01-04 (×2): via INTRAVENOUS

## 2017-01-03 MED ORDER — PROPOFOL 10 MG/ML IV BOLUS
INTRAVENOUS | Status: DC | PRN
  Administered 2017-01-03: 150 mg via INTRAVENOUS

## 2017-01-03 MED ORDER — GABAPENTIN 300 MG PO CAPS
ORAL_CAPSULE | ORAL | Status: AC
  Filled 2017-01-03: qty 1

## 2017-01-03 MED ORDER — MORPHINE SULFATE (PF) 4 MG/ML IV SOLN: 2.0000 mg | INTRAVENOUS | Status: DC | PRN

## 2017-01-03 MED ORDER — LORAZEPAM 0.5 MG PO TABS: 0.5000 mg | ORAL_TABLET | Freq: Every evening | ORAL | Status: DC | PRN

## 2017-01-03 MED ORDER — OXYCODONE HCL 5 MG PO TABS
5.0000 mg | ORAL_TABLET | ORAL | Status: DC | PRN
Start: 2017-01-03 — End: 2017-01-04
  Administered 2017-01-03 (×3): 5 mg via ORAL
  Administered 2017-01-04: 10 mg via ORAL
  Filled 2017-01-03 (×2): qty 1
  Filled 2017-01-03: qty 2
  Filled 2017-01-03: qty 1

## 2017-01-03 MED ORDER — ACETAMINOPHEN 500 MG PO TABS
ORAL_TABLET | ORAL | Status: AC
  Filled 2017-01-03: qty 2

## 2017-01-03 MED ORDER — ACETAMINOPHEN 500 MG PO TABS: 1000.0000 mg | ORAL_TABLET | ORAL | Status: DC

## 2017-01-03 MED ORDER — FENTANYL CITRATE (PF) 250 MCG/5ML IJ SOLN
INTRAMUSCULAR | Status: DC | PRN
  Administered 2017-01-03 (×3): 50 ug via INTRAVENOUS

## 2017-01-03 SURGICAL SUPPLY — 63 items
ADH SKN CLS APL DERMABOND .7 (GAUZE/BANDAGES/DRESSINGS) ×2
APPLIER CLIP 9.375 MED OPEN (MISCELLANEOUS)
APR CLP MED 9.3 20 MLT OPN (MISCELLANEOUS)
BINDER BREAST LRG (GAUZE/BANDAGES/DRESSINGS) ×1 IMPLANT
BINDER BREAST XLRG (GAUZE/BANDAGES/DRESSINGS) IMPLANT
BIOPATCH RED 1 DISK 7.0 (GAUZE/BANDAGES/DRESSINGS) ×2 IMPLANT
CANISTER SUCT 3000ML PPV (MISCELLANEOUS) ×2 IMPLANT
CAP TITANIUM CLICK LOCK 3D (MISCELLANEOUS) ×2 IMPLANT
CHLORAPREP W/TINT 26ML (MISCELLANEOUS) ×2 IMPLANT
CLIP APPLIE 9.375 MED OPEN (MISCELLANEOUS) IMPLANT
CONT SPEC 4OZ CLIKSEAL STRL BL (MISCELLANEOUS) ×2 IMPLANT
COVER PROBE W GEL 5X96 (DRAPES) ×2 IMPLANT
COVER SURGICAL LIGHT HANDLE (MISCELLANEOUS) ×2 IMPLANT
DERMABOND ADVANCED (GAUZE/BANDAGES/DRESSINGS) ×2
DERMABOND ADVANCED .7 DNX12 (GAUZE/BANDAGES/DRESSINGS) ×1 IMPLANT
DRAIN CHANNEL 19F RND (DRAIN) ×2 IMPLANT
DRAPE LAPAROSCOPIC ABDOMINAL (DRAPES) ×2 IMPLANT
DRSG TEGADERM 2-3/8X2-3/4 SM (GAUZE/BANDAGES/DRESSINGS) ×2 IMPLANT
DRSG TEGADERM 4X4.75 (GAUZE/BANDAGES/DRESSINGS) ×1 IMPLANT
ELECT BLADE 4.0 EZ CLEAN MEGAD (MISCELLANEOUS) ×2
ELECT CAUTERY BLADE 6.4 (BLADE) ×2 IMPLANT
ELECT REM PT RETURN 9FT ADLT (ELECTROSURGICAL) ×2
ELECTRODE BLDE 4.0 EZ CLN MEGD (MISCELLANEOUS) ×1 IMPLANT
ELECTRODE REM PT RTRN 9FT ADLT (ELECTROSURGICAL) ×1 IMPLANT
EVACUATOR SILICONE 100CC (DRAIN) ×3 IMPLANT
GAUZE SPONGE 4X4 12PLY STRL (GAUZE/BANDAGES/DRESSINGS) ×2 IMPLANT
GLOVE BIO SURGEON STRL SZ7 (GLOVE) ×3 IMPLANT
GLOVE BIOGEL PI IND STRL 7.0 (GLOVE) IMPLANT
GLOVE BIOGEL PI IND STRL 7.5 (GLOVE) ×1 IMPLANT
GLOVE BIOGEL PI INDICATOR 7.0 (GLOVE) ×1
GLOVE BIOGEL PI INDICATOR 7.5 (GLOVE) ×1
GLOVE ECLIPSE 6.5 STRL STRAW (GLOVE) ×1 IMPLANT
GOWN STRL REUS W/ TWL LRG LVL3 (GOWN DISPOSABLE) ×3 IMPLANT
GOWN STRL REUS W/TWL LRG LVL3 (GOWN DISPOSABLE) ×8
HEMOSTAT ARISTA ABSORB 3G PWDR (MISCELLANEOUS) ×1 IMPLANT
KIT BASIN OR (CUSTOM PROCEDURE TRAY) ×2 IMPLANT
KIT ROOM TURNOVER OR (KITS) ×2 IMPLANT
MARKER SKIN DUAL TIP RULER LAB (MISCELLANEOUS) ×2 IMPLANT
NDL 18GX1X1/2 (RX/OR ONLY) (NEEDLE) IMPLANT
NDL FILTER BLUNT 18X1 1/2 (NEEDLE) IMPLANT
NDL HYPO 25GX1X1/2 BEV (NEEDLE) IMPLANT
NEEDLE 18GX1X1/2 (RX/OR ONLY) (NEEDLE) ×2 IMPLANT
NEEDLE FILTER BLUNT 18X 1/2SAF (NEEDLE) ×1
NEEDLE FILTER BLUNT 18X1 1/2 (NEEDLE) ×1 IMPLANT
NEEDLE HYPO 25GX1X1/2 BEV (NEEDLE) IMPLANT
NS IRRIG 1000ML POUR BTL (IV SOLUTION) ×2 IMPLANT
PACK GENERAL/GYN (CUSTOM PROCEDURE TRAY) ×2 IMPLANT
PAD ARMBOARD 7.5X6 YLW CONV (MISCELLANEOUS) ×2 IMPLANT
PIN SAFETY STERILE (MISCELLANEOUS) ×3 IMPLANT
SPECIMEN JAR X LARGE (MISCELLANEOUS) ×2 IMPLANT
STAPLER VISISTAT 35W (STAPLE) ×2 IMPLANT
STRIP CLOSURE SKIN 1/2X4 (GAUZE/BANDAGES/DRESSINGS) ×3 IMPLANT
SUT ETHILON 2 0 FS 18 (SUTURE) ×5 IMPLANT
SUT MON AB 4-0 PC3 18 (SUTURE) ×3 IMPLANT
SUT PROLENE 0 CT 2 (SUTURE) ×1 IMPLANT
SUT SILK 2 0 SH (SUTURE) IMPLANT
SUT VIC AB 3-0 54X BRD REEL (SUTURE) ×1 IMPLANT
SUT VIC AB 3-0 BRD 54 (SUTURE) ×2
SUT VIC AB 3-0 SH 18 (SUTURE) ×3 IMPLANT
SUT VIC AB 3-0 SH 8-18 (SUTURE) ×2 IMPLANT
SYR CONTROL 10ML LL (SYRINGE) ×2 IMPLANT
TOWEL OR 17X24 6PK STRL BLUE (TOWEL DISPOSABLE) ×2 IMPLANT
TOWEL OR 17X26 10 PK STRL BLUE (TOWEL DISPOSABLE) ×2 IMPLANT

## 2017-01-03 NOTE — Anesthesia Procedure Notes (Signed)
Anesthesia Regional Block: Pectoralis block   Pre-Anesthetic Checklist: ,, timeout performed, Correct Patient, Correct Site, Correct Laterality, Correct Procedure, Correct Position, site marked, Risks and benefits discussed,  Surgical consent,  Pre-op evaluation,  At surgeon's request and post-op pain management  Laterality: Right  Prep: chloraprep       Needles:  Injection technique: Single-shot  Needle Type: Echogenic Needle     Needle Length: 9cm  Needle Gauge: 21     Additional Needles:   Procedures: ultrasound guided,,,,,,,,  Narrative:  Start time: 01/03/2017 12:22 PM End time: 01/03/2017 12:29 PM Injection made incrementally with aspirations every 5 mL.  Performed by: Personally  Anesthesiologist: Suzette Battiest

## 2017-01-03 NOTE — Anesthesia Procedure Notes (Signed)
Procedure Name: LMA Insertion Date/Time: 01/03/2017 1:21 PM Performed by: Julieta Bellini Pre-anesthesia Checklist: Patient identified, Emergency Drugs available, Suction available and Patient being monitored Patient Re-evaluated:Patient Re-evaluated prior to inductionOxygen Delivery Method: Circle system utilized Preoxygenation: Pre-oxygenation with 100% oxygen Intubation Type: IV induction Ventilation: Mask ventilation without difficulty LMA: LMA inserted LMA Size: 4.0 Number of attempts: 1 Placement Confirmation: positive ETCO2 and breath sounds checked- equal and bilateral Tube secured with: Tape Dental Injury: Teeth and Oropharynx as per pre-operative assessment

## 2017-01-03 NOTE — Op Note (Signed)
Preoperative diagnosis: Right breast cancer, clinical stage II s/p primary chemotherapy Postoperative diagnosis: same as above Procedure:  1. Right total mastectomy 2. Right targeted axillary node dissection 3. Injection blue dye for sn identification Surgeon: Dr. Serita Grammes Anesthesia: Gen. with pectoral block Specimens:  Right breast marked short superior, long lateral,  right axillary nodes with sentinel node count of 139, seed containing node and regional resection of right axillary nodes including additional radioactive and palpable nodes Estimated blood loss: 100 cc Drains: 19 French Blake drain x 2 Complications: None Sponge count was correct at completion Disposition to recovery stable  Indications: This is a 11 yof who has undergone primary chemotherapy. She has modest response and nodes do appear normal. I still think she needs mastectomy given exam and prior cancer with her breast size.  We did discuss a possible targeted axillary dissection.  She had seed placed in node prior to beginning.    Procedure: After informed consent was obtained the patient was taken to the operating room. She had undergone a pectoral block and had received technetium in the standard periareolar fashion. She received antibiotics. SCDs were in place. She was placed under general anesthesia without complication. She was prepped and draped in the standard sterile surgical fashion. Surgical timeout was then performed.  I injected blue dye in periareolar fashion and massaged this for two minutes for later sentinel node identification.  I made an elliptical incision to encompass the nipple areolar complex. I then created flaps to the clavicle superiorly, parasternal area medially, inframammary crease, and the latissimus laterally. The breast and the pectoralis fasciawerethen removed from the pectoralis muscle. Specimen was marked as above. This was passed off the table. I did place an additional stitch  the port to secure it down again.   I then used the neoprobe to identify the sentinel lymph node. I entered the axilla. I removed the sentinel nodes. There was no background radioactivity. I also noted the seed containing node and this was sent separately.  The seed was confirmed by mammography. The clip in the seed was not there.  I assume this was dislodged during the dissection in the axilla.  There were some enlarged axillary nodes present that I removed as well all the way up to the axillary vein.  There were no more enlarged nodes present upon completion. She essentially ends up with an axillary dissection due to palpable nodes that were concerning.  I did identify the long thoracic nerve, thoracodorsal bundle and the axillary vein and none were injured.  I then obtained hemostasis. I then inserted 2 19 [place] Blake drains and secured these with a 2-0 nylon suture. The dermis was closed with interrupted 3-0 Vicryl sutures. The skin was closed with 4-0 Monocryl. Glue and Steri-Strips were applied.  Biopatches and Tegaderm were placed over the drain site. A breast binder was placed. She tolerated this well was extubated and transferred to the recovery room in stable condition.

## 2017-01-03 NOTE — Anesthesia Preprocedure Evaluation (Addendum)
Anesthesia Evaluation  Patient identified by MRN, date of birth, ID band Patient awake    Reviewed: Allergy & Precautions, NPO status , Patient's Chart, lab work & pertinent test results  Airway Mallampati: II  TM Distance: >3 FB Neck ROM: Full    Dental  (+) Dental Advisory Given   Pulmonary Current Smoker,    breath sounds clear to auscultation       Cardiovascular + Peripheral Vascular Disease   Rhythm:Regular Rate:Normal     Neuro/Psych Anxiety negative neurological ROS     GI/Hepatic Neg liver ROS, hiatal hernia, GERD  ,  Endo/Other  Hypothyroidism   Renal/GU negative Renal ROS     Musculoskeletal   Abdominal   Peds  Hematology negative hematology ROS (+)   Anesthesia Other Findings   Reproductive/Obstetrics                            Lab Results  Component Value Date   WBC 3.8 (L) 12/22/2016   HGB 11.7 12/22/2016   HCT 35.4 12/22/2016   MCV 106.3 (H) 12/22/2016   PLT 142 (L) 12/22/2016   Lab Results  Component Value Date   CREATININE 0.7 12/22/2016   BUN 10.1 12/22/2016   NA 144 12/22/2016   K 3.9 12/22/2016   CL 107 08/14/2016   CO2 26 12/22/2016    Anesthesia Physical Anesthesia Plan  ASA: II  Anesthesia Plan: General   Post-op Pain Management:  Regional for Post-op pain   Induction: Intravenous  Airway Management Planned: LMA  Additional Equipment:   Intra-op Plan:   Post-operative Plan: Extubation in OR  Informed Consent: I have reviewed the patients History and Physical, chart, labs and discussed the procedure including the risks, benefits and alternatives for the proposed anesthesia with the patient or authorized representative who has indicated his/her understanding and acceptance.   Dental advisory given  Plan Discussed with: CRNA  Anesthesia Plan Comments:         Anesthesia Quick Evaluation

## 2017-01-03 NOTE — Transfer of Care (Signed)
Immediate Anesthesia Transfer of Care Note  Patient: Cynthia Chavez  Procedure(s) Performed: Procedure(s): RIGHT TOTAL MASTECTOMY WITH RIGHT AXILLARY SENTINEL LYMPH NODE BIOPSY (Right) RIGHT AXILLARY LYMPH NODE SEED GUIDED EXCISIONAL BIOPSY (Right)  Patient Location: PACU  Anesthesia Type:General and GA combined with regional for post-op pain  Level of Consciousness: awake, alert , oriented and patient cooperative  Airway & Oxygen Therapy: Patient Spontanous Breathing  Post-op Assessment: Report given to RN, Post -op Vital signs reviewed and stable and Patient moving all extremities X 4  Post vital signs: Reviewed and stable  Last Vitals:  Vitals:   01/03/17 1236 01/03/17 1505  BP: (!) 141/73   Pulse: 73   Resp: 17   Temp:  (P) 36.1 C    Last Pain:  Vitals:   01/03/17 1505  TempSrc:   PainSc: (P) Asleep         Complications: No apparent anesthesia complications

## 2017-01-03 NOTE — H&P (Signed)
61 yof seen for node positive right breast cancer prior to chemotherapy. she noted a palpable right breast mass prior to dx. she has no discharge. she has no personal history of breast cancer. She has a niece at age 63 with breast cancer but no other family history. she underwent mm that showed b density breasts. there was upper inner right breast mass measuring 1.3 cm. There was additional partially obscured mass measuring about 3.6 cm. left breast negative. US showed a 1.5x1x1 mass at 2 oclock 6 cm from nac. this corresponds to uiq mass on mm. there is also at 1230 5 cm from nac a mass measuring 3.6x2.4x2.3 cm and there is another mass at 11 oclock measuring 4.6x1.9x2.7 cm. the entire area is about 8 cm in size. she also has a single node with cortical thickening measuring 2.7 cm in size. biopsy of 11 oclock mass is IDC with LVI that is er pos/pr pos,/her2 negative/Ki 90, biopsy of node is positive and is er pos/pr pos/her 2 pos/Ki 60%, the 2 oclock mass is IDC wih LVI that is er pos/pr pos/her2 pos/Ki is 90%. MRI shows 8.5 cm area in right breast. she has now undergone primary chemo after port placement. she has no issues with port. She has repear mri that shows residual right breast mass measuring 1.3 cm with residual nonmeasurable scattered foci of heterogenous enhancement throughout the superior and subareolar right breast. the left breast remains negative. the right axillary nodes are now normal appearing. she finished chemotherapy about 10 days ago and is slowly regaining strength   Past Surgical History Sharyn Lull R. Brooks, CMA; 12/11/2016 10:19 AM) Breast Biopsy  Right. Gallbladder Surgery - Open  Hysterectomy (not due to cancer) - Partial   Diagnostic Studies History Sharyn Lull R. Brooks, CMA; 12/11/2016 10:19 AM) Colonoscopy  5-10 years ago Mammogram  within last year Pap Smear  >5 years ago  Allergies Sharyn Lull R. Brooks, CMA; 12/11/2016 10:19 AM) Iodinated Glycerol  *COUGH/COLD/ALLERGY*  rash Dilacor XR *CALCIUM CHANNEL BLOCKERS*  shortness of breath, swelling Aspir-81 *ANALGESICS - NonNarcotic*  shortness of breath Codeine Phosphate *ANALGESICS - OPIOID*  nausea vomiting  Medication History (Michelle R. Brooks, CMA; 12/11/2016 10:23 AM) Levothyroxine Sodium (100MCG For Solution, Intravenous daily) Active. Lomotil (2.5-0.025MG Tablet, Oral) Active. Anusol (1% Ointment, Rectal) Active. EMLA (2.5-2.5% Cream, External) Active. LORazepam (0.5MG Tablet, Oral) Active. Zofran (8MG Tablet, Oral) Active. Potassium Chloride (20MEQ Packet, Oral) Active. Compazine (10MG Capsule ER, Oral) Active. Medications Reconciled  Social History Sharyn Lull R. Brooks, CMA; 12/11/2016 10:20 AM) Caffeine use  Carbonated beverages, Coffee. No alcohol use  No drug use  Tobacco use  Current every day smoker.  Family History Sharyn Lull R. Brooks, CMA; 12/11/2016 10:20 AM) Alcohol Abuse  Brother. Arthritis  Mother. Cancer  Sister. Hypertension  Brother, Sister, Son. Thyroid problems  Mother, Sister.  Pregnancy / Birth History Sharyn Lull R. Brooks, CMA; 12/11/2016 10:20 AM) Age at menarche  61 years. Gravida  3 Maternal age  60-20 Para  3  Other Problems Rolm Bookbinder, MD; 12/11/2016 3:52 PM) Breast Cancer  Gastroesophageal Reflux Disease  Hemorrhoids  Lump In Breast  Thyroid Disease   Vitals Sharyn Lull R. Brooks CMA; 12/11/2016 10:19 AM) 12/11/2016 10:18 AM Weight: 157.25 lb Height: 67in Body Surface Area: 1.83 m Body Mass Index: 24.63 kg/m  Pulse: 76 (Regular)  BP: 136/84 (Sitting, Left Arm, Standard) Physical Exam Rolm Bookbinder MD; 12/11/2016 3:46 PM) General Mental Status-Alert. Orientation-Oriented X3. Head and Neck Thyroid -Note: no thyroid mass of thyromegaly. Eye Sclera/Conjunctiva -  Bilateral-No scleral icterus. Chest and Lung Exam Chest and lung exam reveals -quiet, even and easy respiratory effort  with no use of accessory muscles and on auscultation, normal breath sounds, no adventitious sounds and normal vocal resonance. Breast Nipples-No Discharge. Note: superior right breast with about 5 cm of vague thickening but no real discrete mass anymore Cardiovascular Cardiovascular examination reveals -normal heart sounds, regular rate and rhythm with no murmurs. Abdomen Note: soft nt/nd Lymphatic Head & Neck General Head & Neck Lymphatics: Bilateral - Description - Normal. Axillary General Axillary Region: Bilateral - Description - Normal. Note: no [place] adenopathy  Assessment & Plan Rolm Bookbinder MD; 12/11/2016 3:51 PM) BREAST CANCER OF UPPER-INNER QUADRANT OF RIGHT FEMALE BREAST (C50.211) Story: Right total mastectomy, right targeted axillary dissection we reviewed her history and her repeat mri at end of chemotherapy. will plan for surgery in next 2-3 weeks. discussed nutrition and protein supplements as well as activity for next couple of weeks. I still think she needs mastectomy. she has had response to chemo- would not expect to be complete given biology of tumor. I am still concerned on exam, by mri that lumpectomy alone likely would not effectively clear this disease, we discussed this at length today and have decided to proceed with right total mastectomy without reconstruction given need for radiation and smoking history. we discussed risks, overnight hospital stay and postop drains. we discussed options for nodes including tad and alnd. I think reasonable given response to do TAD. I discussed this would still be accompanied by radiotherapy and likely will be equivalent for cancer with less risk of lymphedema. we discussed seed placement, seed guided excision of prior biopsied node as well as a sentinel node with blue dye and radioactivity. there is small chance based on final path that she would need to return for alnd but unlikely now.

## 2017-01-03 NOTE — Interval H&P Note (Signed)
History and Physical Interval Note:  01/03/2017 12:36 PM  Cynthia Chavez  has presented today for surgery, with the diagnosis of RIGHT BREAST CANCER  The various methods of treatment have been discussed with the patient and family. After consideration of risks, benefits and other options for treatment, the patient has consented to  Procedure(s): RIGHT TOTAL MASTECTOMY WITH RIGHT AXILLARY SENTINEL LYMPH NODE BIOPSY (Right) RIGHT AXILLARY LYMPH NODE SEED GUIDED EXCISIONAL BIOPSY (Right) as a surgical intervention .  The patient's history has been reviewed, patient examined, no change in status, stable for surgery.  I have reviewed the patient's chart and labs.  Questions were answered to the patient's satisfaction.     [name]

## 2017-01-04 ENCOUNTER — Encounter (HOSPITAL_COMMUNITY): Payer: Self-pay | Admitting: General Surgery

## 2017-01-04 DIAGNOSIS — C50411 Malignant neoplasm of upper-outer quadrant of right female breast: Secondary | ICD-10-CM | POA: Diagnosis not present

## 2017-01-04 MED ORDER — METHOCARBAMOL 500 MG PO TABS
500.0000 mg | ORAL_TABLET | Freq: Four times a day (QID) | ORAL | 0 refills | Status: DC | PRN
Start: 2017-01-04 — End: 2017-04-06

## 2017-01-04 MED ORDER — OXYCODONE HCL 5 MG PO TABS: 5.0000 mg | ORAL_TABLET | ORAL | 0 refills | Status: DC | PRN

## 2017-01-04 NOTE — Discharge Summary (Signed)
Physician Discharge Summary  Patient ID: Cynthia Chavez MRN: [id number] DOB/AGE: March 08, 1956 61 y.o.  Admit date: 01/03/2017 Discharge date: 01/04/2017  Admission Diagnoses: Breast cancer  Discharge Diagnoses:  Active Problems:   Breast cancer of upper-outer quadrant of right female breast [facility])   Discharged Condition good  Hospital Course: 22 yof s/p rimary chemotherapy underwent right total mastectomy and TAD.  She did well overnight and will be discharged.  Consults: none  Significant Diagnostic Studies: none  Treatments surgery    Disposition: 01-Home or Self Care   Allergies as of 01/04/2017      Reactions   Ivp Dye [iodinated Diagnostic Agents] Other (See Comments)   Chest pain   Other Shortness Of Breath, Swelling, Other (See Comments)   Versed or Fentanyl when used for endoscopy caused throat and tongue swelling   Dilaudid [hydromorphone Hcl]    PT had CP and felt shaky. Didn't like the feeling, but sx lessened w/in 10 min of IV administration   Asa [aspirin] Other (See Comments)   spasms   Codeine Other (See Comments)   spasms      Medication List    TAKE these medications   acetaminophen 500 MG tablet Commonly known as:  TYLENOL Take 1,000 mg by mouth every 4 (four) hours as needed for mild pain or moderate pain.   dexamethasone 4 MG tablet Commonly known as:  DECADRON Take 1 tablet (4 mg total) by mouth daily. Take 1 tab day before and 1 tab day after chemo   diphenoxylate-atropine 2.5-0.025 MG tablet Commonly known as:  LOMOTIL Take 2 tablets by mouth 4 (four) times daily as needed for diarrhea or loose stools.   hydrocortisone 2.5 % rectal cream Commonly known as:  ANUSOL-HC Place 1 application rectally 2 (two) times daily.   hydrocortisone 25 MG suppository Commonly known as:  ANUSOL-HC Place 1 suppository (25 mg total) rectally 2 (two) times daily.   levothyroxine 125 MCG tablet Commonly known as:  SYNTHROID, LEVOTHROID Take 1 tablet (125  mcg total) by mouth daily before breakfast.   lidocaine-prilocaine cream Commonly known as:  EMLA Apply to affected area once What changed:  how much to take  how to take this  when to take this  reasons to take this  additional instructions   LORazepam 0.5 MG tablet Commonly known as:  ATIVAN Take 1 tablet (0.5 mg total) by mouth at bedtime. As needed for sleep What changed:  when to take this  reasons to take this  additional instructions   methocarbamol 500 MG tablet Commonly known as:  ROBAXIN Take 1 tablet (500 mg total) by mouth every 6 (six) hours as needed for muscle spasms.   metroNIDAZOLE 500 MG tablet Commonly known as:  FLAGYL Take 1 tablet (500 mg total) by mouth 4 (four) times daily.   omeprazole 20 MG capsule Commonly known as:  PRILOSEC Take 20 mg by mouth daily as needed.   ondansetron 8 MG tablet Commonly known as:  ZOFRAN Take 1 tablet (8 mg total) by mouth 2 (two) times daily as needed for refractory nausea / vomiting. Start on day 3 after chemo.   oxyCODONE 5 MG immediate release tablet Commonly known as:  Oxy IR/ROXICODONE Take 1-2 tablets (5-10 mg total) by mouth every 4 (four) hours as needed for moderate pain.   potassium chloride SA 20 MEQ tablet Commonly known as:  K-DUR,KLOR-CON Take 1 tablet (20 mEq total) by mouth daily.   prochlorperazine 10 MG tablet Commonly known as:  COMPAZINE Take 1 tablet (10 mg total) by mouth every 6 (six) hours as needed (Nausea or vomiting).        SignedRolm Bookbinder 01/04/2017, 7:54 PM

## 2017-01-04 NOTE — Anesthesia Postprocedure Evaluation (Signed)
Anesthesia Post Note  Patient: Cynthia Chavez  Procedure(s) Performed: Procedure(s) (LRB): RIGHT TOTAL MASTECTOMY WITH RIGHT AXILLARY SENTINEL LYMPH NODE BIOPSY (Right) RIGHT AXILLARY LYMPH NODE SEED GUIDED EXCISIONAL BIOPSY (Right)  Patient location during evaluation: PACU Anesthesia Type: General Level of consciousness: awake and alert Pain management: pain level controlled Vital Signs Assessment: post-procedure vital signs reviewed and stable Respiratory status: spontaneous breathing, nonlabored ventilation, respiratory function stable and patient connected to nasal cannula oxygen Cardiovascular status: blood pressure returned to baseline and stable Postop Assessment: no signs of nausea or vomiting Anesthetic complications: no       Last Vitals:  Vitals:   01/03/17 2121 01/04/17 0440  BP: 96/65 118/66  Pulse: (!) 59 66  Resp: 16 16  Temp: 37 C 37.1 C    Last Pain:  Vitals:   01/04/17 0657  TempSrc:   PainSc: 10-Worst pain ever                 Tiajuana Amass

## 2017-01-04 NOTE — Progress Notes (Signed)
1 Day Post-Op   Subjective/Chief Complaint: Doing ok this am, pain controlled ,tolerating diet, has been up   Objective: Vital signs in last 24 hours: Temp:  [97 F (36.1 C)-98.8 F (37.1 C)] 98.8 F (37.1 C) (05/31 0440) Pulse Rate:  [54-73] 66 (05/31 0440) Resp:  [10-18] 16 (05/31 0440) BP: (96-161)/(62-86) 118/66 (05/31 0440) SpO2:  [95 %-100 %] 96 % (05/31 0440) Weight:  [71.9 kg (158 lb 8.2 oz)] 71.9 kg (158 lb 8.2 oz) (05/30 1057)    Intake/Output from previous day: 05/30 0701 - 05/31 0700 In: 1840 [I.V.:1840] Out: 255 [Drains:205; Blood:50] Intake/Output this shift: No intake/output data recorded.  Incision/Wound: no hematoma, swollen where binder is cutting into her, flaps viable, drains serous   Studies/Results: Mm Breast Surgical Specimen  Result Date: 01/03/2017 CLINICAL DATA:  Evaluate specimen EXAM: SPECIMEN RADIOGRAPH OF THE RIGHT BREAST COMPARISON:  Previous exam(s). FINDINGS: Status post excision of the right breast. The radioactive seed is within 1 of the 2 presented specimens. The biopsy clip is not in either specimen. Of note, the biopsy clip was seen on an MLO mammogram. The patient could have a mammogram at her convenience to evaluate for the remaining presence of the biopsy clip if clinically warranted. IMPRESSION: Specimen radiograph of the right axilla. Electronically Signed   By: Dorise Bullion III M.D   On: 01/03/2017 14:35    Anti-infectives: Anti-infectives    Start     Dose/Rate Route Frequency Ordered Stop   01/03/17 1107  ceFAZolin (ANCEF) 2-4 GM/100ML-% IVPB    Comments:  Merryl Hacker   : cabinet override      01/03/17 1107 01/03/17 1315   01/03/17 1104  ceFAZolin (ANCEF) IVPB 2g/100 mL premix     2 g 200 mL/hr over 30 Minutes Intravenous On call to O.R. 01/03/17 1104 01/03/17 1325      Assessment/Plan: POD 1 right tm, tad  Continue pain control If does well can dc home today Needs straps on  binder  Lilian Fuhs 01/04/2017

## 2017-01-04 NOTE — Discharge Planning (Signed)
Patient discharged home in stable condition. Verbalizes understanding of all discharge instructions, including home medications, JP drain care and follow up appointments.  ?

## 2017-01-05 ENCOUNTER — Encounter (HOSPITAL_COMMUNITY): Payer: Self-pay | Admitting: General Surgery

## 2017-01-05 NOTE — OR Nursing (Signed)
Late entry due to delay documentation.

## 2017-01-12 ENCOUNTER — Encounter: Payer: Self-pay | Admitting: Hematology and Oncology

## 2017-01-12 ENCOUNTER — Ambulatory Visit: Payer: BLUE CROSS/BLUE SHIELD

## 2017-01-12 ENCOUNTER — Other Ambulatory Visit (HOSPITAL_BASED_OUTPATIENT_CLINIC_OR_DEPARTMENT_OTHER): Payer: BLUE CROSS/BLUE SHIELD

## 2017-01-12 ENCOUNTER — Other Ambulatory Visit: Payer: Self-pay | Admitting: Hematology and Oncology

## 2017-01-12 ENCOUNTER — Ambulatory Visit (HOSPITAL_BASED_OUTPATIENT_CLINIC_OR_DEPARTMENT_OTHER): Payer: BLUE CROSS/BLUE SHIELD | Admitting: Hematology and Oncology

## 2017-01-12 ENCOUNTER — Other Ambulatory Visit: Payer: Self-pay

## 2017-01-12 ENCOUNTER — Ambulatory Visit (HOSPITAL_BASED_OUTPATIENT_CLINIC_OR_DEPARTMENT_OTHER): Payer: BLUE CROSS/BLUE SHIELD

## 2017-01-12 DIAGNOSIS — Z170 Estrogen receptor positive status [ER+]: Principal | ICD-10-CM

## 2017-01-12 DIAGNOSIS — C50411 Malignant neoplasm of upper-outer quadrant of right female breast: Secondary | ICD-10-CM

## 2017-01-12 DIAGNOSIS — Z5112 Encounter for antineoplastic immunotherapy: Secondary | ICD-10-CM

## 2017-01-12 DIAGNOSIS — Z95828 Presence of other vascular implants and grafts: Secondary | ICD-10-CM

## 2017-01-12 LAB — COMPREHENSIVE METABOLIC PANEL
ALK PHOS: 89 U/L (ref 40–150)
ALT: 11 U/L (ref 0–55)
ANION GAP: 7 meq/L (ref 3–11)
AST: 14 U/L (ref 5–34)
Albumin: 3.4 g/dL — ABNORMAL LOW (ref 3.5–5.0)
BUN: 14 mg/dL (ref 7.0–26.0)
CHLORIDE: 107 meq/L (ref 98–109)
CO2: 27 meq/L (ref 22–29)
CREATININE: 0.6 mg/dL (ref 0.6–1.1)
Calcium: 9.1 mg/dL (ref 8.4–10.4)
EGFR: >90 mL/min/{1.73_m2} (ref >90)
Glucose: 82 mg/dl (ref 70–140)
POTASSIUM: 3.8 meq/L (ref 3.5–5.1)
Sodium: 141 mEq/L (ref 136–145)
Total Bilirubin: 0.33 mg/dL (ref 0.20–1.20)
Total Protein: 6.3 g/dL — ABNORMAL LOW (ref 6.4–8.3)

## 2017-01-12 LAB — CBC WITH DIFFERENTIAL/PLATELET
BASO%: 0.2 % (ref 0.0–2.0)
BASOS ABS: 0 10*3/uL (ref 0.0–0.1)
EOS%: 4.7 % (ref 0.0–7.0)
Eosinophils Absolute: 0.3 10*3/uL (ref 0.0–0.5)
HCT: 33.4 % — ABNORMAL LOW (ref 34.8–46.6)
HGB: 11 g/dL — ABNORMAL LOW (ref 11.6–15.9)
LYMPH%: 27.1 % (ref 14.0–49.7)
MCH: 35.4 pg — AB (ref 25.1–34.0)
MCHC: 32.9 g/dL (ref 31.5–36.0)
MCV: 107.4 fL — ABNORMAL HIGH (ref 79.5–101.0)
MONO#: 0.3 10*3/uL (ref 0.1–0.9)
MONO%: 6.2 % (ref 0.0–14.0)
NEUT#: 3.4 10*3/uL (ref 1.5–6.5)
NEUT%: 61.8 % (ref 38.4–76.8)
Platelets: 194 10*3/uL (ref 145–400)
RBC: 3.11 10*6/uL — ABNORMAL LOW (ref 3.70–5.45)
RDW: 14.9 % — ABNORMAL HIGH (ref 11.2–14.5)
WBC: 5.5 10*3/uL (ref 3.9–10.3)
lymph#: 1.5 10*3/uL (ref 0.9–3.3)

## 2017-01-12 MED ORDER — ACETAMINOPHEN 325 MG PO TABS
650.0000 mg | ORAL_TABLET | Freq: Once | ORAL | Status: AC
  Administered 2017-01-12: 650 mg via ORAL

## 2017-01-12 MED ORDER — SODIUM CHLORIDE 0.9% FLUSH
10.0000 mL | INTRAVENOUS | Status: DC | PRN
  Administered 2017-01-12: 10 mL
  Filled 2017-01-12: qty 10

## 2017-01-12 MED ORDER — SODIUM CHLORIDE 0.9 % IV SOLN
Freq: Once | INTRAVENOUS | Status: AC
  Administered 2017-01-12: 11:00:00 via INTRAVENOUS

## 2017-01-12 MED ORDER — HEPARIN SOD (PORK) LOCK FLUSH 100 UNIT/ML IV SOLN
500.0000 [IU] | Freq: Once | INTRAVENOUS | Status: AC | PRN
  Administered 2017-01-12: 500 [IU]
  Filled 2017-01-12: qty 5

## 2017-01-12 MED ORDER — DIPHENHYDRAMINE HCL 25 MG PO CAPS: 50.0000 mg | ORAL_CAPSULE | Freq: Once | ORAL | Status: DC

## 2017-01-12 MED ORDER — ACETAMINOPHEN 325 MG PO TABS
ORAL_TABLET | ORAL | Status: AC
  Filled 2017-01-12: qty 2

## 2017-01-12 MED ORDER — TRASTUZUMAB CHEMO 150 MG IV SOLR
450.0000 mg | Freq: Once | INTRAVENOUS | Status: AC
  Administered 2017-01-12: 450 mg via INTRAVENOUS
  Filled 2017-01-12: qty 21.43

## 2017-01-12 MED ORDER — SODIUM CHLORIDE 0.9% FLUSH
10.0000 mL | INTRAVENOUS | Status: DC | PRN
  Administered 2017-01-12: 10 mL via INTRAVENOUS
  Filled 2017-01-12: qty 10

## 2017-01-12 MED ORDER — SODIUM CHLORIDE 0.9 % IV SOLN
420.0000 mg | Freq: Once | INTRAVENOUS | Status: AC
  Administered 2017-01-12: 420 mg via INTRAVENOUS
  Filled 2017-01-12: qty 14

## 2017-01-12 NOTE — Assessment & Plan Note (Signed)
Rt Biopsy 11oclock and 2 o clock: Grade 3 IDC with LVI; LN biopsy: Positive IDC; 11oclock: Er 100%, PR 90%, Ki 67: 90%; Her 2 Neg Heterogeneous; LN: Er 100%, PR: 90%; Ki67: 60%; Her 2 Pos Ratio 2.37; 2oclock: Er 100%, PR 90%; Ki 67: 90%; Her 2 Positive 2.52 Breast MRI on 08/13/2016: 8 cm span of breast cancer with multiple enlarged axillary lymph nodes. CT CAP: 08/17/16: 4 mm right middle lobe pulmonary nodule unlikely to be metastatic disease Bone scan 08/17/2016: No evidence of metastatic disease  Recommendationbased on multidisciplinary tumor board: 1. Neoadjuvant chemotherapy with TCH Perjeta 6 cycles followed by Herceptin Perjeta maintenance for 1 year 08/16/16 to 12/01/16 2. Followed by mastectomy with axillary node dissection 01/03/2017 3. Followed by adjuvant radiation therapy 4. Followed by Anti-estrogen therapy  01/04/2007 Right mastectomy: Multiple IDC with micropapillary features, LVI, grade 3, 1.5, 0.6, 0.5 cm, 12/20 lymph nodes positive for cancer, margins negative, ER 100%, PR 90%, HER-2 positive, Ki-67 60%,ypT1c ypN3 RCB class III; stage IIIB  Pathology review: I discussed the final pathology report with the patient and expressed that the number of lymph nodes involved is extremely concerning with the high risk of recurrence distantly.  Return to clinic after radiation therapy is complete to start antiestrogen spell. She will continue with Herceptin and Perjeta maintenance

## 2017-01-12 NOTE — Patient Instructions (Addendum)
[facility] Discharge Instructions for Patients Receiving Chemotherapy  **ECHO APPOINTMENT June 18 at 11 AM at [facility]. Check in at 1045 with admitting (across from the gift shop)**  Today you received the following chemotherapy agents Herceptin/Perjeta  To help prevent nausea and vomiting after your treatment, we encourage you to take your nausea medication    If you develop nausea and vomiting that is not controlled by your nausea medication, call the clinic.   BELOW ARE SYMPTOMS THAT SHOULD BE REPORTED IMMEDIATELY:  *FEVER GREATER THAN 100.5 F  *CHILLS WITH OR WITHOUT FEVER  NAUSEA AND VOMITING THAT IS NOT CONTROLLED WITH YOUR NAUSEA MEDICATION  *UNUSUAL SHORTNESS OF BREATH  *UNUSUAL BRUISING OR BLEEDING  TENDERNESS IN MOUTH AND THROAT WITH OR WITHOUT PRESENCE OF ULCERS  *URINARY PROBLEMS  *BOWEL PROBLEMS  UNUSUAL RASH Items with * indicate a potential emergency and should be followed up as soon as possible.  Feel free to call the clinic you have any questions or concerns. The clinic phone number is [phone].  Please show the [place] at check-in to the Emergency Department and triage nurse.

## 2017-01-12 NOTE — Progress Notes (Signed)
Patient Care Team: [name], MD as PCP - General (Pediatrics)  DIAGNOSIS:  Encounter Diagnosis  Name Primary?  . Malignant neoplasm of upper-outer quadrant of right breast in female, estrogen receptor positive ([place])     SUMMARY OF ONCOLOGIC HISTORY:   Breast cancer of upper-outer quadrant of right female breast ([place])   07/24/2016 Mammogram    Rt Breast 2 masses at 11-12:30 Position spanning 8 cm; Additional mass at 2 o clock: 1.5 cm; LN with cort thickening 2.7 cm      07/27/2016 Initial Diagnosis    Rt Biopsy 11oclock and 2 o clock: Grade 3 IDC with LVI; LN biopsy: Positive IDC; 11oclock: Er 100%, PR 90%, Ki 67: 90%; Her 2 Neg Heterogeneous; LN: Er 100%, PR: 90%; Ki67: 60%; Her 2 Pos Ratio 2.37; 2oclock: Er 100%, PR 90%; Ki 67: 90%; Her 2 Positive 2.52      08/16/2016 - 12/01/2016 Neo-Adjuvant Chemotherapy    Neoadjuvant chemotherapy with TCH Perjeta (Taxotere d/ced after 2 cycles)      12/06/2016 Breast MRI    Residual right breast 2 o'clock enhancing mass which now measures 1.3 cm in greatest dimension.Residual non measurable scattered foci of heterogeneous enhancement throughout the superior and subareolar right breast, anterior and middle depth. Diminished right axillary lymphadenopathy      01/03/2017 Surgery    Right mastectomy: Multiple IDC with micropapillary features, LVI, grade 3, 1.5, 0.6, 0.5 cm, 12/20 lymph nodes positive for cancer, margins negative, ER 100%, PR 90%, HER-2 positive, Ki-67 60%,ypT1c ypN3 RCB class III; stage IIIB       CHIEF COMPLIANT: Follow-up of recent right mastectomy  INTERVAL HISTORY: Cynthia Chavez is a 61 year old with above-mentioned history of right breast cancer treated with the neoadjuvant chemotherapy followed by mastectomy. You to the primary tumor in the breast from can size the lymph nodes were still positive. 12 or 20 lymph nodes came back positive. She is recovering from the surgery. She continues to have drains in place and  there is a lot of fluid in the drainage tube. Somewhat fatigued but otherwise recovering.  REVIEW OF SYSTEMS:   Constitutional: Denies fevers, chills or abnormal weight loss Eyes: Denies blurriness of vision Ears, nose, mouth, throat, and face: Denies mucositis or sore throat Respiratory: Denies cough, dyspnea or wheezes Cardiovascular: Denies palpitation, chest discomfort Gastrointestinal:  Denies nausea, heartburn or change in bowel habits Skin: Denies abnormal skin rashes Lymphatics: Denies new lymphadenopathy or easy bruising Neurological: Mild peripheral neuropathy Behavioral/Psych: Mood is stable, no new changes  Extremities: No lower extremity edema  All other systems were reviewed with the patient and are negative.  I have reviewed the past medical history, past surgical history, social history and family history with the patient and they are unchanged from previous note.  ALLERGIES:  is allergic to ivp dye [iodinated diagnostic agents]; other; dilaudid [hydromorphone hcl]; asa [aspirin]; and codeine.  MEDICATIONS:  Current Outpatient Prescriptions  Medication Sig Dispense Refill  . acetaminophen (TYLENOL) 500 MG tablet Take 1,000 mg by mouth every 4 (four) hours as needed for mild pain or moderate pain.    Marland Kitchen dexamethasone (DECADRON) 4 MG tablet Take 1 tablet (4 mg total) by mouth daily. Take 1 tab day before and 1 tab day after chemo (Patient not taking: Reported on 12/27/2016) 15 tablet 0  . diphenoxylate-atropine (LOMOTIL) 2.5-0.025 MG tablet Take 2 tablets by mouth 4 (four) times daily as needed for diarrhea or loose stools. (Patient not taking: Reported on 12/27/2016) 45  tablet 0  . hydrocortisone (ANUSOL-HC) 2.5 % rectal cream Place 1 application rectally 2 (two) times daily. (Patient not taking: Reported on 12/27/2016) 30 g 3  . hydrocortisone (ANUSOL-HC) 25 MG suppository Place 1 suppository (25 mg total) rectally 2 (two) times daily. (Patient not taking: Reported on  12/27/2016) 12 suppository 0  . levothyroxine (SYNTHROID, LEVOTHROID) 125 MCG tablet Take 1 tablet (125 mcg total) by mouth daily before breakfast. 90 tablet 0  . lidocaine-prilocaine (EMLA) cream Apply to affected area once (Patient taking differently: Apply 1 application topically as needed (before treatments). Apply to affected area once) 30 g 3  . LORazepam (ATIVAN) 0.5 MG tablet Take 1 tablet (0.5 mg total) by mouth at bedtime. As needed for sleep (Patient taking differently: Take 0.5 mg by mouth at bedtime as needed for sleep. As needed for sleep) 30 tablet 0  . methocarbamol (ROBAXIN) 500 MG tablet Take 1 tablet (500 mg total) by mouth every 6 (six) hours as needed for muscle spasms. 20 tablet 0  . metroNIDAZOLE (FLAGYL) 500 MG tablet Take 1 tablet (500 mg total) by mouth 4 (four) times daily. (Patient not taking: Reported on 12/27/2016) 56 tablet 0  . omeprazole (PRILOSEC) 20 MG capsule Take 20 mg by mouth daily as needed.    . ondansetron (ZOFRAN) 8 MG tablet Take 1 tablet (8 mg total) by mouth 2 (two) times daily as needed for refractory nausea / vomiting. Start on day 3 after chemo. (Patient not taking: Reported on 12/27/2016) 30 tablet 1  . oxyCODONE (OXY IR/ROXICODONE) 5 MG immediate release tablet Take 1-2 tablets (5-10 mg total) by mouth every 4 (four) hours as needed for moderate pain. 20 tablet 0  . potassium chloride SA (K-DUR,KLOR-CON) 20 MEQ tablet Take 1 tablet (20 mEq total) by mouth daily. (Patient not taking: Reported on 12/27/2016) 30 tablet 0  . prochlorperazine (COMPAZINE) 10 MG tablet Take 1 tablet (10 mg total) by mouth every 6 (six) hours as needed (Nausea or vomiting). (Patient not taking: Reported on 12/27/2016) 30 tablet 1   No current facility-administered medications for this visit.    Facility-Administered Medications Ordered in Other Visits  Medication Dose Route Frequency Provider Last Rate Last Dose  . diphenhydrAMINE (BENADRYL) capsule 50 mg  50 mg Oral Once Nicholas Lose, MD      . heparin lock flush 100 unit/mL  500 Units Intracatheter Once PRN Nicholas Lose, MD      . sodium chloride flush (NS) 0.9 % injection 10 mL  10 mL Intracatheter PRN Nicholas Lose, MD        PHYSICAL EXAMINATION: ECOG PERFORMANCE STATUS: 1 - Symptomatic but completely ambulatory  Vitals:   01/12/17 0952  BP: 131/80  Pulse: 69  Resp: 18  Temp: 97.8 F (36.6 C)   Filed Weights   01/12/17 0952  Weight: 158 lb 8 oz (71.9 kg)    GENERAL:alert, no distress and comfortable SKIN: skin color, texture, turgor are normal, no rashes or significant lesions EYES: normal, Conjunctiva are pink and non-injected, sclera clear OROPHARYNX:no exudate, no erythema and lips, buccal mucosa, and tongue normal  NECK: supple, thyroid normal size, non-tender, without nodularity LYMPH:  no palpable lymphadenopathy in the cervical, axillary or inguinal LUNGS: clear to auscultation and percussion with normal breathing effort HEART: regular rate & rhythm and no murmurs and no lower extremity edema ABDOMEN:abdomen soft, non-tender and normal bowel sounds MUSCULOSKELETAL:no cyanosis of digits and no clubbing  NEURO: alert & oriented x 3 with fluent speech,  no focal motor/sensory deficits EXTREMITIES: No lower extremity edema BREAST: Post mastectomy drains in place  LABORATORY DATA:  I have reviewed the data as listed   Chemistry      Component Value Date/Time   NA 141 01/12/2017 0918   K 3.8 01/12/2017 0918   CL 107 08/14/2016 1605   CO2 27 01/12/2017 0918   BUN 14.0 01/12/2017 0918   CREATININE 0.6 01/12/2017 0918      Component Value Date/Time   CALCIUM 9.1 01/12/2017 0918   ALKPHOS 89 01/12/2017 0918   AST 14 01/12/2017 0918   ALT 11 01/12/2017 0918   BILITOT 0.33 01/12/2017 0918       Lab Results  Component Value Date   WBC 5.5 01/12/2017   HGB 11.0 (L) 01/12/2017   HCT 33.4 (L) 01/12/2017   MCV 107.4 (H) 01/12/2017   PLT 194 01/12/2017   NEUTROABS 3.4 01/12/2017     ASSESSMENT & PLAN:  Breast cancer of upper-outer quadrant of right female breast ([place]) Rt Biopsy 11oclock and 2 o clock: Grade 3 IDC with LVI; LN biopsy: Positive IDC; 11oclock: Er 100%, PR 90%, Ki 67: 90%; Her 2 Neg Heterogeneous; LN: Er 100%, PR: 90%; Ki67: 60%; Her 2 Pos Ratio 2.37; 2oclock: Er 100%, PR 90%; Ki 67: 90%; Her 2 Positive 2.52 Breast MRI on 08/13/2016: 8 cm span of breast cancer with multiple enlarged axillary lymph nodes. CT CAP: 08/17/16: 4 mm right middle lobe pulmonary nodule unlikely to be metastatic disease Bone scan 08/17/2016: No evidence of metastatic disease  Recommendationbased on multidisciplinary tumor board: 1. Neoadjuvant chemotherapy with TCH Perjeta 6 cycles followed by Herceptin Perjeta maintenance for 1 year 08/16/16 to 12/01/16 2. Followed by mastectomy with axillary node dissection 01/03/2017 3. Followed by adjuvant radiation therapy 4. Followed by Anti-estrogen therapy 5. Followed by Neratinib for 1 year  -------------------------------------------------------------------------------------------------------------------------------------------------------------------------------------------------------------------------------------- 01/04/2007 Right mastectomy: Multiple IDC with micropapillary features, LVI, grade 3, 1.5, 0.6, 0.5 cm, 12/20 lymph nodes positive for cancer, margins negative, ER 100%, PR 90%, HER-2 positive, Ki-67 60%,ypT1c ypN3 RCB class III; stage IIIB  right breast drains: She has appointments to see her surgeon.  Pathology review: I discussed the final pathology report with the patient and expressed that the number of lymph nodes involved is extremely concerning with the high risk of recurrence distantly.  I discussed with the patient that residual disease in these lymph nodes is suggestive of high risk of recurrence.  Return to clinic after radiation therapy is complete to start antiestrogen spell. She will continue with Herceptin and  Perjeta maintenance  I spent 25 minutes talking to the patient of which more than half was spent in counseling and coordination of care.  No orders of the defined types were placed in this encounter.  The patient has a good understanding of the overall plan. she agrees with it. she will call with any problems that may develop before the next visit here.   Rulon Eisenmenger, MD 01/12/17

## 2017-01-12 NOTE — Progress Notes (Signed)
Location of Breast Cancer: Right Breast  Histology per Pathology Report:  07/27/16 Diagnosis 1. Breast, right, needle core biopsy, 11 o'clock mass - INVASIVE DUCTAL CARCINOMA. - LYMPHOVASCULAR SPACE INVOLVEMENT BY TUMOR.  Receptor Status: ER (100%), PR (90%) Her2 (NEG) Ki (90%)  2. Lymph node, needle/core biopsy, right axillary - METASTATIC CARCINOMA.  Receptor Status: ER (100%), PR (90%), Her2 (POS) Ki (60%)  3. Breast, right, needle core biopsy, 2 o'clock mass - INVASIVE DUCTAL CARCINOMA. - LYMPHOVASCULAR SPACE INVOLVEMENT BY TUMOR.  Receptor Status: ER(100%), PR (90%), Her2-neu (POS), Ki-(90%)  01/03/17 Diagnosis 1. Lymph node, biopsy, Right Axilla METASTATIC CARCINOMA IN ONE OF ONE LYMPH NODE (1/1) 2. Breast, modified radical mastectomy , Right MULTIPLE INVASIVE DUCTAL CARCINOMA WITH MICROPAPILLARY FEATURES, GRADE 3, SPANNING 1.5, 0.6 AND 0.5 CM LYMPHOVASCULAR INVASION IS IDENTIFIED ALL MARGINS OF RESECTION ARE NEGATIVE FOR CARCINOMA METASTATIC CARCINOMA IN THREE OF SEVEN LYMPH NODES (3/7) 3. Lymph nodes, regional resection, Right Axillary Contents METASTATIC CARCINOMA IN EIGHT OF TWELVE LYMPH NODES (8/12)  Did patient present with symptoms or was this found on screening mammography?: She felt the mass in her Right Breast.   Past/Anticipated interventions by surgeon, if any: 01/03/17 Procedure:  1. Righttotal mastectomy 2. Righttargeted axillary node dissection 3. Injection blue dye for sn identification Surgeon: Dr. Matt Wakefield   Past/Anticipated interventions by medical oncology, if any: 12/22/16 Dr. Gudena Recommendationbased on multidisciplinary tumor board: 1. Neoadjuvant chemotherapy with TCH Perjeta 6 cycles followed by Herceptin maintenance for 1 year 08/16/16 to 12/01/16 2. Followed by breast conserving surgery versus mastectomy with targeted axillary node dissection 3. Followed by adjuvant radiation therapy 4. Followed by Anti-estrogen  therapy  RTC after surgery Continue q 3 week Herceptin maintenance  01/12/17 Dr. Gudena appointment and Herceptin maintenance.   Lymphedema issues, if any: She has some swelling to her incision site. She saw her surgeon on 01/15/17 and had her drain removed.  She will see him again on 01/24/17.  Pain issues, if any:  She denies pain. She does have soreness to her Right Breast.   SAFETY ISSUES:  Prior radiation? No  Pacemaker/ICD? No  Possible current pregnancy? No  Is the patient on methotrexate? No  Current Complaints / other details:  BP 121/79   Pulse 78   Temp 97.8 F (36.6 C)   Ht 5' 7" (1.702 m)   Wt 158 lb 9.6 oz (71.9 kg)   SpO2 99% Comment: room air  BMI 24.84 kg/m     Wt Readings from Last 3 Encounters:  01/17/17 158 lb 9.6 oz (71.9 kg)  01/12/17 158 lb 8 oz (71.9 kg)  01/03/17 158 lb 8.2 oz (71.9 kg)      Malmfelt, Jennifer L, RN 01/12/2017,8:24 AM   

## 2017-01-12 NOTE — Progress Notes (Signed)
Scheduled pt for echocardiogram for 01/22/17 Monday at [facility]. Notified pt and is aware of appt.

## 2017-01-12 NOTE — Progress Notes (Signed)
Pt okay to proceed with herceptin/perjeta per Dr.Gudena. Will reschedule pt ECHO next week. Will have scheduling call and confirm pt echo appt time/date. Notified infusion RN of plan.

## 2017-01-17 ENCOUNTER — Encounter: Payer: Self-pay | Admitting: Radiation Oncology

## 2017-01-17 ENCOUNTER — Ambulatory Visit
Admission: RE | Admit: 2017-01-17 | Discharge: 2017-01-17 | Disposition: A | Discharge status: Home / Self Care | Payer: BLUE CROSS/BLUE SHIELD | Source: Ambulatory Visit | Attending: Radiation Oncology | Admitting: Radiation Oncology

## 2017-01-17 DIAGNOSIS — Z510 Encounter for antineoplastic radiation therapy: Secondary | ICD-10-CM | POA: Diagnosis not present

## 2017-01-17 DIAGNOSIS — F329 Major depressive disorder, single episode, unspecified: Secondary | ICD-10-CM | POA: Insufficient documentation

## 2017-01-17 DIAGNOSIS — F17210 Nicotine dependence, cigarettes, uncomplicated: Secondary | ICD-10-CM | POA: Diagnosis not present

## 2017-01-17 DIAGNOSIS — C773 Secondary and unspecified malignant neoplasm of axilla and upper limb lymph nodes: Secondary | ICD-10-CM | POA: Diagnosis not present

## 2017-01-17 DIAGNOSIS — Z9011 Acquired absence of right breast and nipple: Secondary | ICD-10-CM | POA: Insufficient documentation

## 2017-01-17 DIAGNOSIS — Z886 Allergy status to analgesic agent status: Secondary | ICD-10-CM | POA: Insufficient documentation

## 2017-01-17 DIAGNOSIS — F419 Anxiety disorder, unspecified: Secondary | ICD-10-CM | POA: Diagnosis not present

## 2017-01-17 DIAGNOSIS — Z91041 Radiographic dye allergy status: Secondary | ICD-10-CM | POA: Insufficient documentation

## 2017-01-17 DIAGNOSIS — Z90710 Acquired absence of both cervix and uterus: Secondary | ICD-10-CM | POA: Insufficient documentation

## 2017-01-17 DIAGNOSIS — E039 Hypothyroidism, unspecified: Secondary | ICD-10-CM | POA: Diagnosis not present

## 2017-01-17 DIAGNOSIS — K219 Gastro-esophageal reflux disease without esophagitis: Secondary | ICD-10-CM | POA: Diagnosis not present

## 2017-01-17 DIAGNOSIS — Z79899 Other long term (current) drug therapy: Secondary | ICD-10-CM | POA: Diagnosis not present

## 2017-01-17 DIAGNOSIS — Z825 Family history of asthma and other chronic lower respiratory diseases: Secondary | ICD-10-CM | POA: Insufficient documentation

## 2017-01-17 DIAGNOSIS — Z9221 Personal history of antineoplastic chemotherapy: Secondary | ICD-10-CM | POA: Diagnosis not present

## 2017-01-17 DIAGNOSIS — Z885 Allergy status to narcotic agent status: Secondary | ICD-10-CM | POA: Diagnosis not present

## 2017-01-17 DIAGNOSIS — Z809 Family history of malignant neoplasm, unspecified: Secondary | ICD-10-CM | POA: Insufficient documentation

## 2017-01-17 DIAGNOSIS — R21 Rash and other nonspecific skin eruption: Secondary | ICD-10-CM | POA: Diagnosis not present

## 2017-01-17 DIAGNOSIS — C50411 Malignant neoplasm of upper-outer quadrant of right female breast: Secondary | ICD-10-CM | POA: Diagnosis not present

## 2017-01-17 DIAGNOSIS — Z170 Estrogen receptor positive status [ER+]: Secondary | ICD-10-CM | POA: Insufficient documentation

## 2017-01-17 NOTE — Progress Notes (Signed)
Radiation Oncology         [phone] ________________________________  Initial outpatient Consultation  Name: Cynthia Chavez MRN: [id number]  Date: 01/17/2017  DOB: 30-Jul-1956  KA:[id number], [place] Hun, MD  Nicholas Lose, MD   REFERRING PHYSICIAN: Nicholas Lose, MD  DIAGNOSIS:    ICD-10-CM   1. Malignant neoplasm of upper-outer quadrant of right breast in female, estrogen receptor positive ([place]) C50.411    Z17.0     Stage IIIB ypT1c ypN3 cM0 Right Breast UIQ Invasive Ductal Carcinoma, ER Positive / PR Positive / Her2 Positive, Grade 3  CHIEF COMPLAINT: Here to discuss management of right breast cancer  HISTORY OF PRESENT ILLNESS::Cynthia Chavez is a 61 y.o. female who initially presented with palpable abnormality in the right breast in 2017. Notably, she had prior right breast abnormalities which were not significant. This time, her symptoms lasted for several months, and she was thus seen by her PCP, Dr. Assunta Found, in December 2017. She underwent mammogram on 07/24/16. This revealed an hihgly suspicious mass spanning 8 cm, extending from the 11 o'clock to the 2 o'clock positions. She also had enlarged lymph nodes present. Biopsy of the 2 masses along with right axillary lymph node biopsy showed invasive ductal carcinoma.  MR of breasts in January 2018 revealed "extensive mass and non mass enhancement throughout the upper, outer and upper, inner right breast measuring 8.5 x 7.5 x 7.4 cm (AP x TR x CC). The non mass enhancement does extend into the upper aspect of the lower, outer quadrant... There are multiple abnormal enlarged right axillary lymph nodes, the largest measuring 2.4 cm in greatest size."   Staging scans (CT CAP and bone scan) were performed and negative for metastases, but she had a 14m right lung nodule (favored to be benign) warranting followup.  The patient subsequently underwent neoadjuvant chemotherapy with TCheverlyPerjeta from 08/16/16 - 12/01/16, overseen by Dr. GLindi Adie  Taxotere was discontinued after 2 cycles. She then proceeded with right mastectomy with Dr. WDonne Hazelon 01/03/17. Surgical pathology revealed multiple invasive ductal carcinoma with micropapillary features, grade 3, spanning 1.5, 0.6, and 0.5 cm. Lymphovascular invasion was identified. All margins were negative for malignancy. Of 20 lymph nodes sampled, 12 were positive for metastatic carcinoma.  The patient's case was discussed today at the multidisciplinary tumor board conference.  The patient presents to the clinic unaccompanied today to discuss the role that radiation therapy may play in the treatment of her disease.  On review of systems, the patient reports some swelling to her incision site. She last saw Dr. WDonne Hazelon 01/15/17 to have her drain removed, and plans to see him again on 01/24/17. She denies pain at this time, though she does report some soreness to the right breast. She has good range of motion in her right arm, and has not gone to PT.  PREVIOUS RADIATION THERAPY: No  PAST MEDICAL HISTORY:  has a past medical history of Anxiety; Cancer ([place]; Depression; Edema; GERD (gastroesophageal reflux disease); History of hiatal hernia; Hypothyroidism; Malaise; Thyroid disease; and Varicose veins.    PAST SURGICAL HISTORY: Past Surgical History:  Procedure Laterality Date  . ABDOMINAL HYSTERECTOMY    . AXILLARY LYMPH NODE BIOPSY Right 01/03/2017   Procedure: RIGHT AXILLARY LYMPH NODE SEED GUIDED EXCISIONAL BIOPSY;  Surgeon: WRolm Bookbinder MD;  [facility]  Service: General;  Laterality: Right;  . CHOLECYSTECTOMY    . COLONOSCOPY    . ESOPHAGEAL DILATION    . MASTECTOMY W/ SENTINEL NODE BIOPSY  Right 01/03/2017   axillary  . MASTECTOMY W/ SENTINEL NODE BIOPSY Right 01/03/2017   Procedure: RIGHT TOTAL MASTECTOMY WITH RIGHT AXILLARY SENTINEL LYMPH NODE BIOPSY;  Surgeon: Rolm Bookbinder, MD;  Location: [facility];  Service: General;  Laterality: Right;  . PORTACATH PLACEMENT Left  08/17/2016   Procedure: INSERTION PORT-A-CATH WITH [place];  Surgeon: Rolm Bookbinder, MD;  Location: [facility];  Service: General;  Laterality: Left;    FAMILY HISTORY: family history includes COPD in her father; Cancer in her father.  SOCIAL HISTORY:  reports that she has been smoking Cigarettes.  She started smoking about 23 years ago. She has a 12.50 pack-year smoking history. She has never used smokeless tobacco. She reports that she does not drink alcohol or use drugs. She reports she is working towards smoking cessation, and she currently smokes 5 cigarettes daily.  ALLERGIES: Ivp dye [iodinated diagnostic agents]; Other; Dilaudid [hydromorphone hcl]; Asa [aspirin]; and Codeine  MEDICATIONS:  Current Outpatient Prescriptions  Medication Sig Dispense Refill  . acetaminophen (TYLENOL) 500 MG tablet Take 1,000 mg by mouth every 4 (four) hours as needed for mild pain or moderate pain.    Marland Kitchen levothyroxine (SYNTHROID, LEVOTHROID) 125 MCG tablet Take 1 tablet (125 mcg total) by mouth daily before breakfast. 90 tablet 0  . lidocaine-prilocaine (EMLA) cream Apply to affected area once (Patient taking differently: Apply 1 application topically as needed (before treatments). Apply to affected area once) 30 g 3  . LORazepam (ATIVAN) 0.5 MG tablet Take 1 tablet (0.5 mg total) by mouth at bedtime. As needed for sleep (Patient not taking: Reported on 01/17/2017) 30 tablet 0  . methocarbamol (ROBAXIN) 500 MG tablet Take 1 tablet (500 mg total) by mouth every 6 (six) hours as needed for muscle spasms. (Patient not taking: Reported on 01/17/2017) 20 tablet 0  . omeprazole (PRILOSEC) 20 MG capsule Take 20 mg by mouth daily as needed.    . ondansetron (ZOFRAN) 8 MG tablet Take 1 tablet (8 mg total) by mouth 2 (two) times daily as needed for refractory nausea / vomiting. Start on day 3 after chemo. (Patient not taking: Reported on 12/27/2016) 30 tablet 1  . oxyCODONE (OXY IR/ROXICODONE) 5 MG immediate  release tablet Take 1-2 tablets (5-10 mg total) by mouth every 4 (four) hours as needed for moderate pain. (Patient not taking: Reported on 01/17/2017) 20 tablet 0  . prochlorperazine (COMPAZINE) 10 MG tablet Take 1 tablet (10 mg total) by mouth every 6 (six) hours as needed (Nausea or vomiting). (Patient not taking: Reported on 12/27/2016) 30 tablet 1   No current facility-administered medications for this encounter.     REVIEW OF SYSTEMS: as above   PHYSICAL EXAM:  height is '5\' 7"'  (1.702 m) and weight is 158 lb 9.6 oz (71.9 kg). Her temperature is 97.8 F (36.6 C). Her blood pressure is 121/79 and her pulse is 78. Her oxygen saturation is 99%.   General: Alert and oriented, in no acute distress. HEENT: Head is normocephalic. Oropharynx is clear. Neck: Neck is supple, no palpable cervical or supraclavicular lymphadenopathy or masses. Heart: Regular in rate and rhythm with no murmurs. Chest: Clear to auscultation bilaterally. PAC in place in right upper chest. Abdomen: Soft, non tender, non distended. Extremities: No edema in her lower or upper extremities. Lymphatics: see Neck Exam Skin: No concerning lesions. Musculoskeletal: Symmetric strength and muscle tone throughout. Decent range of motion in right upper extremity, though this could still improve. Neurologic: No obvious focalities. Speech is  fluent. Coordination is intact. Psychiatric: Judgment and insight are intact. Affect is appropriate. Breasts: She is still healing from right mastectomy, all drains removed. Still has steri-strips in place over mastectomy scar. She has a right sided bruise in the lower-inner quadrant. No palpable masses in the left breast or left axilla.   ECOG = 0   LABORATORY DATA:  Lab Results  Component Value Date   WBC 5.5 01/12/2017   HGB 11.0 (L) 01/12/2017   HCT 33.4 (L) 01/12/2017   MCV 107.4 (H) 01/12/2017   PLT 194 01/12/2017   CMP     Component Value Date/Time   NA 141 01/12/2017 0918   K  3.8 01/12/2017 0918   CL 107 08/14/2016 1605   CO2 27 01/12/2017 0918   GLUCOSE 82 01/12/2017 0918   BUN 14.0 01/12/2017 0918   CREATININE 0.6 01/12/2017 0918   CALCIUM 9.1 01/12/2017 0918   PROT 6.3 (L) 01/12/2017 0918   ALBUMIN 3.4 (L) 01/12/2017 0918   AST 14 01/12/2017 0918   ALT 11 01/12/2017 0918   ALKPHOS 89 01/12/2017 0918   BILITOT 0.33 01/12/2017 0918   GFRNONAA >60 08/14/2016 1605   GFRAA >60 08/14/2016 1605     RADIOGRAPHY: Mm Breast Surgical Specimen  Result Date: 01/03/2017 CLINICAL DATA:  Evaluate specimen EXAM: SPECIMEN RADIOGRAPH OF THE RIGHT BREAST COMPARISON:  Previous exam(s). FINDINGS: Status post excision of the right breast. The radioactive seed is within 1 of the 2 presented specimens. The biopsy clip is not in either specimen. Of note, the biopsy clip was seen on an MLO mammogram. The patient could have a mammogram at her convenience to evaluate for the remaining presence of the biopsy clip if clinically warranted. IMPRESSION: Specimen radiograph of the right axilla. Electronically Signed   By: Dorise Bullion III M.D   On: 01/03/2017 14:35   [place] Rt Radioactive Seed Loc  Result Date: 12/29/2016 CLINICAL DATA:  Patient presents for radioactive seed localization of a biopsy-proven metastatic right axillary lymph node as patient to undergo a right mastectomy with targeted lymph node dissection. EXAM: ULTRASOUND GUIDED RADIOACTIVE SEED LOCALIZATION OF THE RIGHT AXILLA COMPARISON:  Previous exam(s). FINDINGS: Patient presents for radioactive seed localization prior to surgical excision. I met with the patient and we discussed the procedure of seed localization including benefits and alternatives. We discussed the high likelihood of a successful procedure. We discussed the risks of the procedure including infection, bleeding, tissue injury and further surgery. We discussed the low dose of radioactivity involved in the procedure. Informed, written consent was given. The usual  time-out protocol was performed immediately prior to the procedure. Using ultrasound guidance, sterile technique, 1% lidocaine and an I-125 radioactive seed, the targeted HydroMARK clip was localized using a inferior to superior approach. Real-time and post placement ultrasound images confirm deployment of the radioactive seed. Follow-up survey of the patient confirms presence of the radioactive seed. Order number of I-125 seed:  [id number]. Total activity:  0.249 mCi         Reference Date: 12/25/2016 The patient tolerated the procedure well and was released from the [facility]. She was given instructions regarding seed removal. IMPRESSION: Radioactive seed localization right axilla. No apparent complications. Electronically Signed   By: Marin Olp M.D.   On: 12/29/2016 16:11      IMPRESSION/PLAN: Stage IIIB ypT1c ypN3  cM0 Right Breast UIQ Invasive Ductal Carcinoma, Triple Positive, Grade 3.  It was a pleasure meeting the patient today. We discussed the risks, benefits, and  side effects of radiotherapy. I recommend radiotherapy to the right breast, IM, Axillary, and SCV nodes to reduce her risk of locoregional recurrence by 2/3.  We discussed that radiation would take approximately 6 weeks to complete and that I would give the patient a few weeks to heal following surgery before starting treatment planning. We spoke about acute effects including skin irritation and fatigue as well as much less common late effects including internal organ injury or irritation. We spoke about the latest technology that is used to minimize the risk of late effects for patients undergoing radiotherapy to the breast or chest wall. No guarantees of treatment were given. The patient is enthusiastic about proceeding with treatment. A consent form was signed, discussed, and placed in the patient's chart. I look forward to participating in the patient's care. She will be scheduled for CT simulation and treatment planning 02/02/17  at 8am if possible, in order to consolidate appointments on the same day she has chemotherapy. She will be called with her appointment.  We discussed the patient's attempts at smoking cessation, and I congratulated her on her reduction in tobacco use thus far.  I advised the patient to quit. Services were offered by me today including outpatient counseling and pharmacotherapy. I assessed for the willingness to attempt to quit and provided encouragement and demonstrated willingness to make referrals and/or prescriptions to help the patient attempt to quit. The patient has follow-up with the oncologic team to touch base on their tobacco use and /or cessation efforts.  Over 3 minutes were spent on this issue. I discussed Wellbutrin as a smoking cessation aid, though the patient has declined at this time. I also encouraged her to try Nicotine gum as a cessation aid. The patient is in agreement to try to quit smoking by 02/03/17 in anticipation of beginning RT. She will let me know if she has an interest in Wellbutrin in the future.   I spent 35 minutes face to face with the patient and more than 50% of that time was spent in counseling and/or coordination of care. __________________________________________   Eppie Gibson, MD  This document serves as a record of services personally performed by Eppie Gibson, MD. It was created on her behalf by Maryla Morrow, a trained medical scribe. The creation of this record is based on the scribe's personal observations and the provider's statements to them. This document has been checked and approved by the attending provider.

## 2017-01-18 ENCOUNTER — Telehealth: Payer: Self-pay | Admitting: *Deleted

## 2017-01-18 NOTE — Telephone Encounter (Signed)
PATIENT HAS AN APPT. FOR PT ON 01-19-17 @ 9:30 AM @ [facility] AND THE PATIENT IS AWARE OF THIS APPT.

## 2017-01-19 ENCOUNTER — Encounter: Payer: Self-pay | Admitting: Physical Therapy

## 2017-01-19 ENCOUNTER — Ambulatory Visit: Payer: BLUE CROSS/BLUE SHIELD | Attending: Radiation Oncology | Admitting: Physical Therapy

## 2017-01-19 DIAGNOSIS — M25611 Stiffness of right shoulder, not elsewhere classified: Secondary | ICD-10-CM

## 2017-01-19 DIAGNOSIS — M6281 Muscle weakness (generalized): Secondary | ICD-10-CM | POA: Insufficient documentation

## 2017-01-19 DIAGNOSIS — R293 Abnormal posture: Secondary | ICD-10-CM | POA: Insufficient documentation

## 2017-01-19 DIAGNOSIS — R600 Localized edema: Secondary | ICD-10-CM | POA: Insufficient documentation

## 2017-01-19 DIAGNOSIS — M25511 Pain in right shoulder: Secondary | ICD-10-CM | POA: Insufficient documentation

## 2017-01-19 NOTE — Therapy (Signed)
[place], [place], [address] Phone: [phone]   Fax:  [phone]  Physical Therapy Evaluation  Patient Details  Name: Cynthia Chavez MRN: [id number] Date of Birth: 1956-03-18 Referring Provider: Isidore Moos  Encounter Date: 01/19/2017      PT End of Session - 01/19/17 1114    Visit Number 1   Number of Visits 9   Date for PT Re-Evaluation 02/16/17   PT Start Time 0933   PT Stop Time 1015   PT Time Calculation (min) 42 min   Activity Tolerance Patient tolerated treatment well   Behavior During Therapy [facility] for tasks assessed/performed      Past Medical History:  Diagnosis Date  . Anxiety   . Cancer [facility])    breast cancer  . Depression   . Edema    bilateral feet and leg swelling  . GERD (gastroesophageal reflux disease)   . History of hiatal hernia   . Hypothyroidism   . Malaise   . Thyroid disease   . Varicose veins     Past Surgical History:  Procedure Laterality Date  . ABDOMINAL HYSTERECTOMY    . AXILLARY LYMPH NODE BIOPSY Right 01/03/2017   Procedure: RIGHT AXILLARY LYMPH NODE SEED GUIDED EXCISIONAL BIOPSY;  Surgeon: Rolm Bookbinder, MD;  Location: [place];  Service: General;  Laterality: Right;  . CHOLECYSTECTOMY    . COLONOSCOPY    . ESOPHAGEAL DILATION    . MASTECTOMY W/ SENTINEL NODE BIOPSY Right 01/03/2017   axillary  . MASTECTOMY W/ SENTINEL NODE BIOPSY Right 01/03/2017   Procedure: RIGHT TOTAL MASTECTOMY WITH RIGHT AXILLARY SENTINEL LYMPH NODE BIOPSY;  Surgeon: Rolm Bookbinder, MD;  Location: [place];  Service: General;  Laterality: Right;  . PORTACATH PLACEMENT Left 08/17/2016   Procedure: INSERTION PORT-A-CATH WITH [place];  Surgeon: Rolm Bookbinder, MD;  Location: [place];  Service: General;  Laterality: Left;    There were no vitals filed for this visit.       Subjective Assessment - 01/19/17 0938    Subjective Pt had a right mastectomy on 01/03/17  with ALND. I can move my arm pretty good but when I raise my arm up it pulls. It hurts to lay on it. I am still having swelling where they took the lymph nodes out. I have trouble with neuropathy in my hands and feet.    Pertinent History 07/27/16 Rt Biopsy 11oclock and 2 o clock: Grade 3 IDC with LVI; LN biopsy: Positive IDC; 11oclock: Er 100%, PR 90%, Ki 67: 90%; Her 2 Neg Heterogeneous; LN: Er 100%, PR: 90%; Ki67: 60%; Her 2 Pos Ratio 2.37; 2oclock: Er 100%, PR 90%; Ki 67: 90%; Her 2 Positive 2.52. 08/16/16-12/01/16 completed chemotherapy. 01/03/17 Right mastectomy: Multiple IDC with micropapillary features, LVI, grade 3, 1.5, 0.6, 0.5 cm, 12/20 lymph nodes positive for cancer, margins negative, ER 100%, PR 90%, HER-2 positive, Ki-67 60%,ypT1c ypN3 RCB class III; stage IIIB   Patient Stated Goals I want everything to heal so it doesn't pull or hurt when I move my arm   Currently in Pain? No/denies   Pain Score 0-No pain            [facility] PT Assessment - 01/19/17 0001      Assessment   Medical Diagnosis right breast cancer   Referring Provider [place]   Onset Date/Surgical Date 01/03/17   Hand Dominance Right   Prior Therapy none     Precautions   Precautions Other (  comment)  at risk for lymphedema     Restrictions   Weight Bearing Restrictions No     Balance Screen   Has the patient fallen in the past 6 months No   Has the patient had a decrease in activity level because of a fear of falling?  No   Is the patient reluctant to leave their home because of a fear of falling?  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   Leisure walks everyday for 15  min, takes dog out     Cognition   Overall Cognitive Status Within Functional Limits for tasks assessed     Observation/Other Assessments   Observations masectomy scar still healing, edema in right axilla and surrounding area   Skin Integrity masectomy scar healing with steri strips in place     Posture/Postural Control   Posture/Postural Control Postural limitations   Postural Limitations Rounded Shoulders;Forward head     ROM / Strength   AROM / PROM / Strength AROM     AROM   AROM Assessment Site Shoulder   Right/Left Shoulder Right;Left   Right Shoulder Flexion 120 Degrees   Right Shoulder ABduction 117 Degrees   Right Shoulder Internal Rotation 71 Degrees   Right Shoulder External Rotation 63 Degrees   Left Shoulder Flexion 151 Degrees   Left Shoulder ABduction 161 Degrees   Left Shoulder Internal Rotation 80 Degrees   Left Shoulder External Rotation 90 Degrees           LYMPHEDEMA/ONCOLOGY QUESTIONNAIRE - 01/19/17 0951      Type   Cancer Type right breast cancer     Surgeries   Mastectomy Date 01/03/17   Axillary Lymph Node Dissection Date 01/03/17   Number Lymph Nodes Removed 20  12 were positive     Date Lymphedema/Swelling Started   Date 01/03/17     Treatment   Active Chemotherapy Treatment Yes   Past Chemotherapy Treatment Yes   Date 12/01/16   Active Radiation Treatment No  will begin in July 2018   Past Radiation Treatment No   Current Hormone Treatment No   Past Hormone Therapy No     What other symptoms do you have   Are you Having Heaviness or Tightness No   Are you having Pain Yes   Are you having pitting edema No   Is it Hard or Difficult finding clothes that fit No   Do you have infections No   Is there Decreased scar mobility --  unable to assess due to scar still healing     Lymphedema Assessments   Lymphedema Assessments Upper extremities     Right Upper Extremity Lymphedema   15 cm Proximal to Olecranon Process 28.1 cm    Olecranon Process 26 cm   15 cm Proximal to Ulnar Styloid Process 24.5 cm   Just Proximal to Ulnar Styloid Process 16.5 cm   Across Hand at [employer] 21.1 cm   At [place] of 2nd Digit 6.4 cm     Left Upper Extremity Lymphedema   15 cm Proximal to Olecranon Process 27.1 cm   Olecranon Process  25.5 cm   15 cm Proximal to Ulnar Styloid Process 23.8 cm   Just Proximal to Ulnar Styloid Process 16.5 cm   Across Hand at [employer] 21 cm   At [place] of 2nd Digit 6.4 cm           Quick Dash - 01/19/17 0001    Open a tight or new jar No difficulty   Do heavy household chores (wash walls, wash floors) Mild difficulty   Carry a shopping bag or briefcase No difficulty   Wash your back Mild difficulty   Use a knife to cut food No difficulty   Recreational activities in which you take some force or impact through your arm, shoulder, or hand (golf, hammering, tennis) Moderate difficulty   During the past week, to what extent has your arm, shoulder or hand problem interfered with your normal social activities with family, friends, neighbors, or groups? Modererately   During the past week, to what extent has your arm, shoulder or hand problem limited your work or other regular daily activities Modererately   Arm, shoulder, or hand pain. Moderate   Tingling (pins and needles) in your arm, shoulder, or hand Moderate   Difficulty Sleeping Mild difficulty   DASH Score 29.55 %      Objective measurements completed on examination: See above findings.          [facility] Adult PT Treatment/Exercise - 01/19/17 0001      Manual Therapy   Manual Therapy Edema management   Edema Management foam chip pack created for pt to wear in bra to address right axillary swelling                PT Education - 01/19/17 1020    Education provided Yes   Education Details lymphedema risk reduction practices   Person(s) Educated Patient   Methods Explanation;Handout   Comprehension Verbalized  understanding                [facility] Goals - 01/19/17 1021      CC Long Term Goal  #1   Title Pt will be able to independently verbalize lymphedema risk reduction practices   Time 4   Period Weeks   Status New     CC Long Term Goal  #2   Title Pt will receive appropriate compression garments for long term management of edema   Time 4   Period Weeks   Status New     CC Long Term Goal  #3   Title Pt to demonstrate 160 degrees of right shoulder abduction to allow her to reach out to side and to obtain position necessary for radiation   Baseline 117   Time 4   Period Weeks   Status New     CC Long Term Goal  #4   Title Pt to demonstrate 160 degrees of right shoulder flexion to allow her to reach items overhead   Baseline 120   Time 4   Period Weeks   Status New     CC Long Term Goal  #5   Title Pt to be independent in a home exercise program for continued strengthening and stretching   Time 4   Period Weeks   Status New             Plan - 01/19/17 1016    Clinical Impression Statement Patient presents to PT following a right masectomy and axillary lymph node dissection. 12 of 20 nodes were  positive. She is continuing to receive chemotherapy and will begin radiation in July. She presents with decreased R shoulder ROM. She also has post surgical swelling in her right axilla and surrounding area. Her posture is characterized by forward head and rounded shoulders. Created a foam chip pack today for pt to wear in her bra to help with swelling. Pt would benefit from skilled PT services for right shoulder ROM and strengthening, to assist pt in receiving a prophylactic compression sleeve and to decrease post surgical edema.    History and Personal Factors relevant to plan of care: pt is right handed   Clinical Presentation Evolving   Clinical Presentation due to: beginining radiation in July   Clinical Decision Making Moderate   Rehab Potential Good   Clinical  Impairments Affecting Rehab Potential pt will begin radiation in July   PT Frequency 2x / week   PT Duration 4 weeks   PT Next Visit Plan assess how chip pack worked, begin gentle AAROM/AROM/PROM to R shoulder, MLD to right axillary swelling   Consulted and Agree with Plan of Care Patient      Patient will benefit from skilled therapeutic intervention in order to improve the following deficits and impairments:  Increased edema, Decreased knowledge of precautions, Impaired UE functional use, Decreased strength, Decreased range of motion, Decreased scar mobility, Postural dysfunction, Pain, Increased fascial restricitons  Visit Diagnosis: Stiffness of right shoulder, not elsewhere classified - Plan: PT plan of care cert/re-cert  Acute pain of right shoulder - Plan: PT plan of care cert/re-cert  Abnormal posture - Plan: PT plan of care cert/re-cert  Muscle weakness (generalized) - Plan: PT plan of care cert/re-cert  Localized edema - Plan: PT plan of care cert/re-cert     Problem List Patient Active Problem List   Diagnosis Date Noted  . Port catheter in place 09/08/2016  . Malignant neoplasm of upper-outer quadrant of right breast in female, estrogen receptor positive ([place]) 08/14/2016  . Breast cancer of upper-outer quadrant of right female breast ([place]) 08/13/2016  . Varicose veins of lower extremities with other complications 83/25/4982  . Hypothyroidism 04/24/2013    Allyson Sabal [facility] 01/19/2017, 11:16 AM  [place], [place], [address] Phone: [phone]   Fax:  [phone]  Name: ALEXCIS BICKING MRN: [id number] Date of Birth: [birth year]  Manus Gunning, PT 01/19/17 11:16 AM

## 2017-01-22 ENCOUNTER — Ambulatory Visit (HOSPITAL_COMMUNITY)
Admission: RE | Admit: 2017-01-22 | Discharge: 2017-01-22 | Disposition: A | Discharge status: Home / Self Care | Payer: BLUE CROSS/BLUE SHIELD | Source: Ambulatory Visit | Attending: Hematology and Oncology | Admitting: Hematology and Oncology

## 2017-01-22 DIAGNOSIS — Z170 Estrogen receptor positive status [ER+]: Secondary | ICD-10-CM | POA: Diagnosis not present

## 2017-01-22 DIAGNOSIS — I080 Rheumatic disorders of both mitral and aortic valves: Secondary | ICD-10-CM | POA: Diagnosis not present

## 2017-01-22 DIAGNOSIS — C50411 Malignant neoplasm of upper-outer quadrant of right female breast: Secondary | ICD-10-CM | POA: Insufficient documentation

## 2017-01-24 ENCOUNTER — Ambulatory Visit: Payer: BLUE CROSS/BLUE SHIELD | Admitting: Physical Therapy

## 2017-01-24 DIAGNOSIS — M25611 Stiffness of right shoulder, not elsewhere classified: Secondary | ICD-10-CM | POA: Diagnosis not present

## 2017-01-24 DIAGNOSIS — R293 Abnormal posture: Secondary | ICD-10-CM

## 2017-01-24 DIAGNOSIS — M25511 Pain in right shoulder: Secondary | ICD-10-CM

## 2017-01-24 NOTE — Patient Instructions (Signed)
Shoulder: Flexion (Supine)    With hands shoulder width apart, slowly lower dowel to floor behind head. Do not let elbows bend. Keep back flat. Hold _15___ seconds. Repeat _10___ times. Do __2__ sessions per day. CAUTION: Stretch slowly and gently.  Copyright  VHI. All rights reserved.  Shoulder: Abduction (Supine)    With right arm flat on floor, hold dowel in palm. Slowly move arm up to side of head by pushing with opposite arm. Do not let elbow bend. Hold _15___ seconds. Repeat _10___ times. Do _2___ sessions per day. CAUTION: Stretch slowly and gently.  Copyright  VHI. All rights reserved.

## 2017-01-24 NOTE — Therapy (Signed)
[place], [place], [address] Phone: [phone]   Fax:  [phone]  Physical Therapy Treatment  Patient Details  Name: Cynthia Chavez MRN: [id number] Date of Birth: [birth year] Referring Provider: Isidore Moos  Encounter Date: 01/24/2017      PT End of Session - 01/24/17 1722    Visit Number 2   Number of Visits 9   Date for PT Re-Evaluation 02/16/17   PT Start Time 5686   PT Stop Time 1520   PT Time Calculation (min) 56 min   Activity Tolerance Patient tolerated treatment well   Behavior During Therapy [facility] for tasks assessed/performed      Past Medical History:  Diagnosis Date  . Anxiety   . Cancer [facility])    breast cancer  . Depression   . Edema    bilateral feet and leg swelling  . GERD (gastroesophageal reflux disease)   . History of hiatal hernia   . Hypothyroidism   . Malaise   . Thyroid disease   . Varicose veins     Past Surgical History:  Procedure Laterality Date  . ABDOMINAL HYSTERECTOMY    . AXILLARY LYMPH NODE BIOPSY Right 01/03/2017   Procedure: RIGHT AXILLARY LYMPH NODE SEED GUIDED EXCISIONAL BIOPSY;  Surgeon: Rolm Bookbinder, MD;  Location: [place];  Service: General;  Laterality: Right;  . CHOLECYSTECTOMY    . COLONOSCOPY    . ESOPHAGEAL DILATION    . MASTECTOMY W/ SENTINEL NODE BIOPSY Right 01/03/2017   axillary  . MASTECTOMY W/ SENTINEL NODE BIOPSY Right 01/03/2017   Procedure: RIGHT TOTAL MASTECTOMY WITH RIGHT AXILLARY SENTINEL LYMPH NODE BIOPSY;  Surgeon: Rolm Bookbinder, MD;  Location: [place];  Service: General;  Laterality: Right;  . PORTACATH PLACEMENT Left 08/17/2016   Procedure: INSERTION PORT-A-CATH WITH [place];  Surgeon: Rolm Bookbinder, MD;  Location: [place];  Service: General;  Laterality: Left;    There were no vitals filed for this visit.      Subjective Assessment - 01/24/17 1429    Subjective My right arm hurts. I just left the doctor  and he told me to keep wearing the chip pack. I think it is helping.    Pertinent History 07/27/16 Rt Biopsy 11oclock and 2 o clock: Grade 3 IDC with LVI; LN biopsy: Positive IDC; 11oclock: Er 100%, PR 90%, Ki 67: 90%; Her 2 Neg Heterogeneous; LN: Er 100%, PR: 90%; Ki67: 60%; Her 2 Pos Ratio 2.37; 2oclock: Er 100%, PR 90%; Ki 67: 90%; Her 2 Positive 2.52. 08/16/16-12/01/16 completed chemotherapy. 01/03/17 Right mastectomy: Multiple IDC with micropapillary features, LVI, grade 3, 1.5, 0.6, 0.5 cm, 12/20 lymph nodes positive for cancer, margins negative, ER 100%, PR 90%, HER-2 positive, Ki-67 60%,ypT1c ypN3 RCB class III; stage IIIB   Patient Stated Goals I want everything to heal so it doesn't pull or hurt when I move my arm   Currently in Pain? Yes   Pain Score 8    Pain Location Axilla   Pain Orientation Right   Pain Descriptors / Indicators Burning   Pain Type Surgical pain   Pain Onset 1 to 4 weeks ago   Pain Frequency Intermittent                         [facility] Adult PT Treatment/Exercise - 01/24/17 0001      Shoulder Exercises: Supine   Flexion AAROM;5 reps  dowel   ABduction AAROM;5 reps  dowel  Manual Therapy   Manual Therapy Manual Lymphatic Drainage (MLD);Passive ROM;Myofascial release   Myofascial Release to cording in left axilla and upper arm   Manual Lymphatic Drainage (MLD) short neck, 5 diaphragmatic breaths, right inguinal nodes and establishment of axillo inguinal pathway, left axillary nodes and establishment of inter axillary pathway, drainage of right lateral trunk moving towards pathway, restablishment of pathways                        [facility] Goals - 01/19/17 1021      CC Long Term Goal  #1   Title Pt will be able to independently verbalize lymphedema risk reduction practices   Time 4   Period Weeks   Status New     CC Long Term Goal  #2   Title Pt will receive appropriate compression garments for long term  management of edema   Time 4   Period Weeks   Status New     CC Long Term Goal  #3   Title Pt to demonstrate 160 degrees of right shoulder abduction to allow her to reach out to side and to obtain position necessary for radiation   Baseline 117   Time 4   Period Weeks   Status New     CC Long Term Goal  #4   Title Pt to demonstrate 160 degrees of right shoulder flexion to allow her to reach items overhead   Baseline 120   Time 4   Period Weeks   Status New     CC Long Term Goal  #5   Title Pt to be independent in a home exercise program for continued strengthening and stretching   Time 4   Period Weeks   Status New            Plan - 01/24/17 1723    Clinical Impression Statement Cording palpable and visible at right axilla today. Performed myofascial to cording. Patient has increased tenderness in right axilla. Performed PROM to R shoulder to gently stretch cording. MLD performed to help decrease right lateral trunk and axillary edema. Pt has been wearing chip pack and reports it has been helping her swelling.    Rehab Potential Good   Clinical Impairments Affecting Rehab Potential pt will begin radiation in July   PT Frequency 2x / week   PT Duration 4 weeks   PT Next Visit Plan continue gentle AAROM/AROM/PROM to R shoulder, MLD to right axillary swelling, myofascial to cording   PT Home Exercise Plan supine dowel exercises   Consulted and Agree with Plan of Care Patient      Patient will benefit from skilled therapeutic intervention in order to improve the following deficits and impairments:  Increased edema, Decreased knowledge of precautions, Impaired UE functional use, Decreased strength, Decreased range of motion, Decreased scar mobility, Postural dysfunction, Pain, Increased fascial restricitons  Visit Diagnosis: Stiffness of right shoulder, not elsewhere classified  Acute pain of right shoulder  Abnormal posture     Problem List Patient Active Problem  List   Diagnosis Date Noted  . Port catheter in place 09/08/2016  . Malignant neoplasm of upper-outer quadrant of right breast in female, estrogen receptor positive ([place]) 08/14/2016  . Breast cancer of upper-outer quadrant of right female breast ([place]) 08/13/2016  . Varicose veins of lower extremities with other complications 40/03/6760  . Hypothyroidism 04/24/2013    Allyson Sabal [facility] 01/24/2017, 5:28 PM  [facility]  [place], [place], [address] Phone: [phone]   Fax:  [phone]  Name: Cynthia Chavez MRN: [id number] Date of Birth: 1956-02-08  Manus Gunning, PT 01/24/17 5:28 PM

## 2017-01-25 ENCOUNTER — Telehealth: Payer: Self-pay | Admitting: *Deleted

## 2017-01-25 NOTE — Telephone Encounter (Signed)
"  I want to know how the heart test turned out.  Call me at [phone]."   Returned call.  Notified patient on 01-22-2017 the ECHO revealed LV EF = 55% which remains within normal limits.  Scheduled treatment is 02-02-2017.  Provider will notify staff of any new orders or instructions with his review.  "Can my appointments be changed so I will not have to wait so long for treatment?"  Current schedule presents wait of 1 hr 15 min from expected completion of flush to treatment.  Infusion room will try to bring her back sooner if possible.

## 2017-01-29 ENCOUNTER — Ambulatory Visit: Payer: BLUE CROSS/BLUE SHIELD | Admitting: Physical Therapy

## 2017-01-29 DIAGNOSIS — M25511 Pain in right shoulder: Secondary | ICD-10-CM

## 2017-01-29 DIAGNOSIS — M25611 Stiffness of right shoulder, not elsewhere classified: Secondary | ICD-10-CM | POA: Diagnosis not present

## 2017-01-29 NOTE — Therapy (Signed)
[place], [place], [address] Phone: [phone]   Fax:  [phone]  Physical Therapy Treatment  Patient Details  Name: Cynthia Chavez MRN: [id number] Date of Birth: 01-17-1956 Referring Provider: Isidore Moos  Encounter Date: 01/29/2017      PT End of Session - 01/29/17 1719    Visit Number 3   Number of Visits 9   Date for PT Re-Evaluation 02/16/17   PT Start Time 1518   PT Stop Time 1602   PT Time Calculation (min) 44 min   Activity Tolerance Patient tolerated treatment well   Behavior During Therapy [facility] for tasks assessed/performed      Past Medical History:  Diagnosis Date  . Anxiety   . Cancer [facility])    breast cancer  . Depression   . Edema    bilateral feet and leg swelling  . GERD (gastroesophageal reflux disease)   . History of hiatal hernia   . Hypothyroidism   . Malaise   . Thyroid disease   . Varicose veins     Past Surgical History:  Procedure Laterality Date  . ABDOMINAL HYSTERECTOMY    . AXILLARY LYMPH NODE BIOPSY Right 01/03/2017   Procedure: RIGHT AXILLARY LYMPH NODE SEED GUIDED EXCISIONAL BIOPSY;  Surgeon: Rolm Bookbinder, MD;  Location: [place];  Service: General;  Laterality: Right;  . CHOLECYSTECTOMY    . COLONOSCOPY    . ESOPHAGEAL DILATION    . MASTECTOMY W/ SENTINEL NODE BIOPSY Right 01/03/2017   axillary  . MASTECTOMY W/ SENTINEL NODE BIOPSY Right 01/03/2017   Procedure: RIGHT TOTAL MASTECTOMY WITH RIGHT AXILLARY SENTINEL LYMPH NODE BIOPSY;  Surgeon: Rolm Bookbinder, MD;  Location: [place];  Service: General;  Laterality: Right;  . PORTACATH PLACEMENT Left 08/17/2016   Procedure: INSERTION PORT-A-CATH WITH [place];  Surgeon: Rolm Bookbinder, MD;  Location: [place];  Service: General;  Laterality: Left;    There were no vitals filed for this visit.      Subjective Assessment - 01/29/17 1521    Subjective The arm is doing pretty good.  I go Friday  for simulation.  Did some of the exercises, but some days didn't feel well from something that she ate.    Pertinent History 07/27/16 Rt Biopsy 11oclock and 2 o clock: Grade 3 IDC with LVI; LN biopsy: Positive IDC; 11oclock: Er 100%, PR 90%, Ki 67: 90%; Her 2 Neg Heterogeneous; LN: Er 100%, PR: 90%; Ki67: 60%; Her 2 Pos Ratio 2.37; 2oclock: Er 100%, PR 90%; Ki 67: 90%; Her 2 Positive 2.52. 08/16/16-12/01/16 completed chemotherapy. 01/03/17 Right mastectomy: Multiple IDC with micropapillary features, LVI, grade 3, 1.5, 0.6, 0.5 cm, 12/20 lymph nodes positive for cancer, margins negative, ER 100%, PR 90%, HER-2 positive, Ki-67 60%,ypT1c ypN3 RCB class III; stage IIIB   Currently in Pain? Yes   Pain Score 0-No pain  at rest, up to 4 with moving arm out and back   Pain Location Breast   Pain Orientation Right   Pain Descriptors / Indicators Tender  pulling   Aggravating Factors  sleeping on that side; moving arm into er and abduction   Pain Relieving Factors arm at rest                         [facility] Adult PT Treatment/Exercise - 01/29/17 0001      Shoulder Exercises: Supine   Flexion AAROM;Both  2 reps, 15 second hold, with dowel for  review   ABduction AAROM;Right  2 reps, 15 second hold, with dowel, for review     Shoulder Exercises: Seated   Abduction Both;5 reps;AROM   Other Seated Exercises seated shoulder rolls at end of session x 5     Manual Therapy   Manual Therapy Soft tissue mobilization;Passive ROM   Soft tissue mobilization to cording in right axilla   Myofascial Release brief crosshands at right axilla from lower axilla to right upper arm with arm in abduction; right UE myofascial pulling   Passive ROM in supine to right shoulder for er, abduction, and flexion; also for horizontal abduction; then into position for radiation                        Long Term Clinic Goals - 01/19/17 1021      CC Long Term Goal  #1   Title Pt will be able to  independently verbalize lymphedema risk reduction practices   Time 4   Period Weeks   Status New     CC Long Term Goal  #2   Title Pt will receive appropriate compression garments for long term management of edema   Time 4   Period Weeks   Status New     CC Long Term Goal  #3   Title Pt to demonstrate 160 degrees of right shoulder abduction to allow her to reach out to side and to obtain position necessary for radiation   Baseline 117   Time 4   Period Weeks   Status New     CC Long Term Goal  #4   Title Pt to demonstrate 160 degrees of right shoulder flexion to allow her to reach items overhead   Baseline 120   Time 4   Period Weeks   Status New     CC Long Term Goal  #5   Title Pt to be independent in a home exercise program for continued strengthening and stretching   Time 4   Period Weeks   Status New            Plan - 01/29/17 1719    Clinical Impression Statement Cording again palpable and visible at right axilla with right shoulder in abduction; only able to elicit one popping sound, but cording did seem to lessen with stretching.  She appeared to make nice ROM gains during session, though before and after measurements were not taken. She also appeared to achieve position needed for radiation.   Rehab Potential Good   Clinical Impairments Affecting Rehab Potential pt will begin radiation in July   PT Frequency 2x / week   PT Duration 4 weeks   PT Next Visit Plan check ROM and goals; continue gentle AAROM/AROM/PROM to R shoulder, MLD to right axillary swelling, myofascial to cording   PT Home Exercise Plan supine dowel exercises   Consulted and Agree with Plan of Care Patient      Patient will benefit from skilled therapeutic intervention in order to improve the following deficits and impairments:  Increased edema, Decreased knowledge of precautions, Impaired UE functional use, Decreased strength, Decreased range of motion, Decreased scar mobility, Postural  dysfunction, Pain, Increased fascial restricitons  Visit Diagnosis: Stiffness of right shoulder, not elsewhere classified  Acute pain of right shoulder     Problem List Patient Active Problem List   Diagnosis Date Noted  . Port catheter in place 09/08/2016  . Malignant neoplasm of upper-outer quadrant of right breast  in female, estrogen receptor positive ([place]) 08/14/2016  . Breast cancer of upper-outer quadrant of right female breast ([place]) 08/13/2016  . Varicose veins of lower extremities with other complications 55/97/4163  . Hypothyroidism 04/24/2013    SALISBURY,DONNA 01/29/2017, 5:22 PM  [place], [place], [address] Phone: [phone]   Fax:  [phone]  Name: NEKO MCGEEHAN MRN: [id number] Date of Birth: 1956-01-23  Serafina Royals, PT 01/29/17 5:23 PM

## 2017-01-31 ENCOUNTER — Ambulatory Visit: Payer: BLUE CROSS/BLUE SHIELD | Admitting: Physical Therapy

## 2017-01-31 ENCOUNTER — Encounter: Payer: Self-pay | Admitting: Pharmacist

## 2017-01-31 DIAGNOSIS — R600 Localized edema: Secondary | ICD-10-CM

## 2017-01-31 DIAGNOSIS — M6281 Muscle weakness (generalized): Secondary | ICD-10-CM

## 2017-01-31 DIAGNOSIS — M25611 Stiffness of right shoulder, not elsewhere classified: Secondary | ICD-10-CM | POA: Diagnosis not present

## 2017-01-31 DIAGNOSIS — C50411 Malignant neoplasm of upper-outer quadrant of right female breast: Secondary | ICD-10-CM

## 2017-01-31 DIAGNOSIS — Z170 Estrogen receptor positive status [ER+]: Secondary | ICD-10-CM

## 2017-01-31 DIAGNOSIS — M25511 Pain in right shoulder: Secondary | ICD-10-CM

## 2017-01-31 DIAGNOSIS — R293 Abnormal posture: Secondary | ICD-10-CM

## 2017-01-31 NOTE — Progress Notes (Signed)
Telephone documentation  Study code: [id number]  Spoke with patient over the phone today and reviewed with her the pharmacogenetic information listed below for the four genes of interest of the "Pharmacogenetic analysis of toxicities related to administration of taxanes in breast cancer patients" study. The information below was reviewed with the patient. Offered the patient the option of speaking with a [facility] genetic counselor and she was not interested in making an appointment.  **The buccal swab testing was NOT conducted at a CLIA validated lab. The patient was reminded that the information given was for informational proposes only and should NOT be used to make clinical decisions.   Gene Phenotype  CYP3A4 Normal metabolizer   CYP3A5 Poor metabolizer   SLCO1B1 Normal function  ABCB1 Low function    Darl Pikes, PharmD, [place] Clinical Pharmacist- Oncology Pharmacy Resident [phone]

## 2017-01-31 NOTE — Therapy (Addendum)
[place], [place], [address] Phone: [phone]   Fax:  [phone]  Physical Therapy Treatment  Patient Details  Name: Cynthia Chavez MRN: [id number] Date of Birth: 1956-03-21 Referring Provider: Isidore Moos  Encounter Date: 01/31/2017      PT End of Session - 01/31/17 1207    Visit Number 4   Number of Visits 9   Date for PT Re-Evaluation 02/16/17   PT Start Time 1105   PT Stop Time 1150   PT Time Calculation (min) 45 min   Activity Tolerance Patient tolerated treatment well   Behavior During Therapy [facility] for tasks assessed/performed      Past Medical History:  Diagnosis Date  . Anxiety   . Cancer [facility])    breast cancer  . Depression   . Edema    bilateral feet and leg swelling  . GERD (gastroesophageal reflux disease)   . History of hiatal hernia   . Hypothyroidism   . Malaise   . Thyroid disease   . Varicose veins     Past Surgical History:  Procedure Laterality Date  . ABDOMINAL HYSTERECTOMY    . AXILLARY LYMPH NODE BIOPSY Right 01/03/2017   Procedure: RIGHT AXILLARY LYMPH NODE SEED GUIDED EXCISIONAL BIOPSY;  Surgeon: Rolm Bookbinder, MD;  Location: [place];  Service: General;  Laterality: Right;  . CHOLECYSTECTOMY    . COLONOSCOPY    . ESOPHAGEAL DILATION    . MASTECTOMY W/ SENTINEL NODE BIOPSY Right 01/03/2017   axillary  . MASTECTOMY W/ SENTINEL NODE BIOPSY Right 01/03/2017   Procedure: RIGHT TOTAL MASTECTOMY WITH RIGHT AXILLARY SENTINEL LYMPH NODE BIOPSY;  Surgeon: Rolm Bookbinder, MD;  Location: [place];  Service: General;  Laterality: Right;  . PORTACATH PLACEMENT Left 08/17/2016   Procedure: INSERTION PORT-A-CATH WITH [place];  Surgeon: Rolm Bookbinder, MD;  Location: [place];  Service: General;  Laterality: Left;    There were no vitals filed for this visit.      Subjective Assessment - 01/31/17 1120    Subjective The last 3 or 4 steristrips came off this  morning.  Once in a while I can feel pain if I do something I shouldn't . I still don't do any heavy lifting    Pertinent History 07/27/16 Rt Biopsy 11oclock and 2 o clock: Grade 3 IDC with LVI; LN biopsy: Positive IDC; 11oclock: Er 100%, PR 90%, Ki 67: 90%; Her 2 Neg Heterogeneous; LN: Er 100%, PR: 90%; Ki67: 60%; Her 2 Pos Ratio 2.37; 2oclock: Er 100%, PR 90%; Ki 67: 90%; Her 2 Positive 2.52. 08/16/16-12/01/16 completed chemotherapy. 01/03/17 Right mastectomy: Multiple IDC with micropapillary features, LVI, grade 3, 1.5, 0.6, 0.5 cm, 12/20 lymph nodes positive for cancer, margins negative, ER 100%, PR 90%, HER-2 positive, Ki-67 60%,ypT1c ypN3 RCB class III; stage IIIB   Patient Stated Goals I want everything to heal so it doesn't pull or hurt when I move my arm   Currently in Pain? No/denies            [facility] PT Assessment - 01/31/17 0001      Observation/Other Assessments   Skin Integrity final steri strips have fallen off , pt with red areas and scabbed area along incision line      AROM   Right Shoulder Flexion 135 Degrees   Right Shoulder ABduction 129 Degrees                     [facility] Adult  PT Treatment/Exercise - 01/31/17 0001      Self-Care   Self-Care Other Self-Care Comments   Other Self-Care Comments  soft white foam patch to lateral incision with thick tg soft at lateral end of incision hoping to keep skin fold apart.  Also reissued chip pack and provided information about knitted knockers      Shoulder Exercises: Supine   Protraction AROM;Right;10 reps   Other Supine Exercises supine scapular series with both arms but no theraband x 5 reps.    Other Supine Exercises dowel rod for AAROM narrow and wide grip x 10 reps each      Shoulder Exercises: Sidelying   External Rotation AROM;Right;10 reps   ABduction AROM;Right;10 reps  small range of motion    Other Sidelying Exercises isometric adduction emphasizing scapular stability activation    Other Sidelying  Exercises small circles with arm pointed toward ceiling                                  Long Term Clinic Goals - 01/31/17 1138      CC Long Term Goal  #1   Title Pt will be able to independently verbalize lymphedema risk reduction practices   Time 4   Period Weeks   Status On-going     CC Long Term Goal  #2   Title Pt will receive appropriate compression garments for long term management of edema   Baseline Pt going to get compression bra on 02/08/2017      CC Long Term Goal  #3   Title Pt to demonstrate 160 degrees of right shoulder abduction to allow her to reach out to side and to obtain position necessary for radiation   Baseline 117 on eval,  129 on 01/31/2017   Time 4   Period Weeks   Status On-going     CC Long Term Goal  #4   Title Pt to demonstrate 160 degrees of right shoulder flexion to allow her to reach items overhead   Baseline 120 on eval,  135 on 01/31/2017   Period Weeks   Status On-going     CC Long Term Goal  #5   Title Pt to be independent in a home exercise program for continued strengthening and stretching   Time 4   Period Weeks   Status On-going            Plan - 01/31/17 1207    Clinical Impression Statement Pt is making progress toward her goals, and is ready for radiation simulation.  She still as some redundancy of skin along lateral incision as is goes under her arm and contracts in at lateral end causing skin fold with red irritation at this spot.  Upgraded foam pads to try to relieve skin irriitation.    Rehab Potential Good   Clinical Impairments Affecting Rehab Potential pt will begin radiation in July   PT Frequency 2x / week   PT Duration 4 weeks   PT Next Visit Plan Assess skin and foam pads  continue gentle AAROM/AROM/PROM to R shoulder, MLD to right axillary swelling, myofascial to cording   Consulted and Agree with Plan of Care Patient      Patient will benefit from skilled therapeutic intervention in order  to improve the following deficits and impairments:  Increased edema, Decreased knowledge of precautions, Impaired UE functional use, Decreased strength, Decreased range of motion, Decreased scar mobility, Postural  dysfunction, Pain, Increased fascial restricitons  Visit Diagnosis: Stiffness of right shoulder, not elsewhere classified  Acute pain of right shoulder  Abnormal posture  Muscle weakness (generalized)  Localized edema     Problem List Patient Active Problem List   Diagnosis Date Noted  . Port catheter in place 09/08/2016  . Malignant neoplasm of upper-outer quadrant of right breast in female, estrogen receptor positive ([place]) 08/14/2016  . Breast cancer of upper-outer quadrant of right female breast ([place]) 08/13/2016  . Varicose veins of lower extremities with other complications 74/71/8550  . Hypothyroidism 04/24/2013   Donato Heinz. Owens Shark PT  Norwood Levo 01/31/2017, 12:15 PM  [place], [place], [address] Phone: [phone]   Fax:  [phone]  Name: Cynthia Chavez MRN: [id number] Date of Birth: [birth year]

## 2017-02-02 ENCOUNTER — Ambulatory Visit
Admission: RE | Admit: 2017-02-02 | Discharge: 2017-02-02 | Disposition: A | Discharge status: Home / Self Care | Payer: BLUE CROSS/BLUE SHIELD | Source: Ambulatory Visit | Attending: Radiation Oncology | Admitting: Radiation Oncology

## 2017-02-02 ENCOUNTER — Other Ambulatory Visit (HOSPITAL_BASED_OUTPATIENT_CLINIC_OR_DEPARTMENT_OTHER): Payer: BLUE CROSS/BLUE SHIELD

## 2017-02-02 ENCOUNTER — Ambulatory Visit: Payer: BLUE CROSS/BLUE SHIELD

## 2017-02-02 ENCOUNTER — Ambulatory Visit (HOSPITAL_BASED_OUTPATIENT_CLINIC_OR_DEPARTMENT_OTHER): Payer: BLUE CROSS/BLUE SHIELD

## 2017-02-02 ENCOUNTER — Ambulatory Visit: Payer: BLUE CROSS/BLUE SHIELD | Admitting: Hematology and Oncology

## 2017-02-02 VITALS — BP 121/70 | HR 75 | Temp 98.1°F | Resp 20

## 2017-02-02 DIAGNOSIS — Z170 Estrogen receptor positive status [ER+]: Principal | ICD-10-CM

## 2017-02-02 DIAGNOSIS — Z95828 Presence of other vascular implants and grafts: Secondary | ICD-10-CM

## 2017-02-02 DIAGNOSIS — C50411 Malignant neoplasm of upper-outer quadrant of right female breast: Secondary | ICD-10-CM

## 2017-02-02 DIAGNOSIS — Z5112 Encounter for antineoplastic immunotherapy: Secondary | ICD-10-CM

## 2017-02-02 DIAGNOSIS — Z510 Encounter for antineoplastic radiation therapy: Secondary | ICD-10-CM | POA: Diagnosis not present

## 2017-02-02 LAB — CBC WITH DIFFERENTIAL/PLATELET
BASO%: 0.5 % (ref 0.0–2.0)
BASOS ABS: 0 10*3/uL (ref 0.0–0.1)
EOS ABS: 0.3 10*3/uL (ref 0.0–0.5)
EOS%: 4.9 % (ref 0.0–7.0)
HCT: 37.1 % (ref 34.8–46.6)
HEMOGLOBIN: 12.5 g/dL (ref 11.6–15.9)
LYMPH%: 20.9 % (ref 14.0–49.7)
MCH: 35.3 pg — AB (ref 25.1–34.0)
MCHC: 33.7 g/dL (ref 31.5–36.0)
MCV: 104.6 fL — AB (ref 79.5–101.0)
MONO#: 0.4 10*3/uL (ref 0.1–0.9)
MONO%: 5.6 % (ref 0.0–14.0)
NEUT%: 68.1 % (ref 38.4–76.8)
NEUTROS ABS: 4.4 10*3/uL (ref 1.5–6.5)
PLATELETS: 193 10*3/uL (ref 145–400)
RBC: 3.55 10*6/uL — ABNORMAL LOW (ref 3.70–5.45)
RDW: 13.6 % (ref 11.2–14.5)
WBC: 6.4 10*3/uL (ref 3.9–10.3)
lymph#: 1.3 10*3/uL (ref 0.9–3.3)

## 2017-02-02 LAB — COMPREHENSIVE METABOLIC PANEL
ALBUMIN: 3.8 g/dL (ref 3.5–5.0)
ALK PHOS: 101 U/L (ref 40–150)
ALT: 16 U/L (ref 0–55)
ANION GAP: 9 meq/L (ref 3–11)
AST: 17 U/L (ref 5–34)
BILIRUBIN TOTAL: 0.67 mg/dL (ref 0.20–1.20)
BUN: 13.4 mg/dL (ref 7.0–26.0)
CALCIUM: 9.4 mg/dL (ref 8.4–10.4)
CO2: 27 mEq/L (ref 22–29)
Chloride: 106 mEq/L (ref 98–109)
Creatinine: 0.7 mg/dL (ref 0.6–1.1)
EGFR: >90 mL/min/{1.73_m2} (ref >90)
Glucose: 90 mg/dl (ref 70–140)
POTASSIUM: 4 meq/L (ref 3.5–5.1)
Sodium: 142 mEq/L (ref 136–145)
Total Protein: 6.8 g/dL (ref 6.4–8.3)

## 2017-02-02 MED ORDER — SODIUM CHLORIDE 0.9% FLUSH
10.0000 mL | INTRAVENOUS | Status: DC | PRN
  Administered 2017-02-02: 10 mL
  Filled 2017-02-02: qty 10

## 2017-02-02 MED ORDER — DIPHENHYDRAMINE HCL 25 MG PO CAPS
ORAL_CAPSULE | ORAL | Status: AC
  Filled 2017-02-02: qty 2

## 2017-02-02 MED ORDER — HEPARIN SOD (PORK) LOCK FLUSH 100 UNIT/ML IV SOLN
500.0000 [IU] | Freq: Once | INTRAVENOUS | Status: AC | PRN
  Administered 2017-02-02: 500 [IU]
  Filled 2017-02-02: qty 5

## 2017-02-02 MED ORDER — ACETAMINOPHEN 325 MG PO TABS
ORAL_TABLET | ORAL | Status: AC
  Filled 2017-02-02: qty 2

## 2017-02-02 MED ORDER — DIPHENHYDRAMINE HCL 25 MG PO CAPS
50.0000 mg | ORAL_CAPSULE | Freq: Once | ORAL | Status: AC
  Administered 2017-02-02: 25 mg via ORAL

## 2017-02-02 MED ORDER — TRASTUZUMAB CHEMO 150 MG IV SOLR
450.0000 mg | Freq: Once | INTRAVENOUS | Status: AC
  Administered 2017-02-02: 450 mg via INTRAVENOUS
  Filled 2017-02-02: qty 21.43

## 2017-02-02 MED ORDER — FLUCONAZOLE 100 MG PO TABS: ORAL_TABLET | ORAL | 0 refills | Status: DC

## 2017-02-02 MED ORDER — SODIUM CHLORIDE 0.9% FLUSH
10.0000 mL | INTRAVENOUS | Status: DC | PRN
  Administered 2017-02-02: 10 mL via INTRAVENOUS
  Filled 2017-02-02: qty 10

## 2017-02-02 MED ORDER — ACETAMINOPHEN 325 MG PO TABS
650.0000 mg | ORAL_TABLET | Freq: Once | ORAL | Status: AC
  Administered 2017-02-02: 650 mg via ORAL

## 2017-02-02 MED ORDER — SODIUM CHLORIDE 0.9 % IV SOLN
420.0000 mg | Freq: Once | INTRAVENOUS | Status: AC
  Administered 2017-02-02: 420 mg via INTRAVENOUS
  Filled 2017-02-02: qty 14

## 2017-02-02 MED ORDER — SODIUM CHLORIDE 0.9 % IV SOLN
Freq: Once | INTRAVENOUS | Status: AC
  Administered 2017-02-02: 12:00:00 via INTRAVENOUS

## 2017-02-02 MED ORDER — CEPHALEXIN 500 MG PO CAPS: 500.0000 mg | ORAL_CAPSULE | Freq: Four times a day (QID) | ORAL | 0 refills | Status: DC

## 2017-02-02 NOTE — Progress Notes (Signed)
  Radiation Oncology         [phone] ________________________________  Name: Cynthia Chavez MRN: [id number]  Date: 02/02/2017  DOB: 1955-10-08  SIMULATION AND TREATMENT PLANNING NOTE    Outpatient  DIAGNOSIS:     ICD-10-CM   1. Malignant neoplasm of upper-outer quadrant of right breast in female, estrogen receptor positive (HCC) C50.411 cephALEXin (KEFLEX) 500 MG capsule   Z17.0 fluconazole (DIFLUCAN) 100 MG tablet    NARRATIVE:  The patient was brought to the [facility].  Identity was confirmed.  All relevant records and images related to the planned course of therapy were reviewed.  The patient freely provided informed written consent to proceed with treatment after reviewing the details related to the planned course of therapy. The consent form was witnessed and verified by the simulation staff.   Due to a skin rash more c/w infection than recurrence at the lateral chest wall scar, I Rx'd medications as above. She knows she needs to call Dr Donne Hazel in 3-4 days if it doesn't improve.    Then, the patient was set-up in a stable reproducible supine position for radiation therapy with her ipsilateral arm over her head, and her upper body secured in a custom-made Vac-lok device.  CT images were obtained.  Surface markings were placed.  The CT images were loaded into the planning software.    TREATMENT PLANNING NOTE: Treatment planning then occurred.  The radiation prescription was entered and confirmed.     A total of 5 medically necessary complex treatment devices were fabricated and supervised by me: 4 fields with MLCs for custom blocks to protect heart, and lungs;  and, a Vac-lok. MORE COMPLEX DEVICES MAY BE MADE IN DOSIMETRY FOR FIELD IN FIELD BEAMS FOR DOSE HOMOGENEITY.  I have requested : 3D Simulation which is medically necessary to give adequate dose to at risk tissues while sparing lungs and heart.  I have requested a DVH of the following structures: lungs,  heart, esophagus, spinal cord.    The patient will receive 50 Gy in 25 fractions to the right chest wall and IM nodes with 2 tangential fields.  I will also treat the SCV and axillary nodes to 50Gy in 25 fractions with 2 more fields. This will be followed by a boost.  Optical Surface Tracking Plan:  Since intensity modulated radiotherapy (IMRT) and 3D conformal radiation treatment methods are predicated on accurate and precise positioning for treatment, intrafraction motion monitoring is medically necessary to ensure accurate and safe treatment delivery. The ability to quantify intrafraction motion without excessive ionizing radiation dose can only be performed with optical surface tracking. Accordingly, surface imaging offers the opportunity to obtain 3D measurements of patient position throughout IMRT and 3D treatments without excessive radiation exposure. I am ordering optical surface tracking for this patient's upcoming course of radiotherapy.  ________________________________   Reference:  Ursula Alert, J, et al. Surface imaging-based analysis of intrafraction motion for breast radiotherapy patients.Journal of [place], n. 6, nov. 2014. ISSN [id number].  Available at: <http://www.jacmp.org/index.php/jacmp/article/view/4957>.    -----------------------------------  Eppie Gibson, MD

## 2017-02-02 NOTE — Patient Instructions (Signed)

## 2017-02-02 NOTE — Patient Instructions (Signed)
[facility] Discharge Instructions for Patients Receiving Chemotherapy  Today you received the following chemotherapy agents Herceptin/Perjeta  To help prevent nausea and vomiting after your treatment, we encourage you to take your nausea medication    If you develop nausea and vomiting that is not controlled by your nausea medication, call the clinic.   BELOW ARE SYMPTOMS THAT SHOULD BE REPORTED IMMEDIATELY:  *FEVER GREATER THAN 100.5 F  *CHILLS WITH OR WITHOUT FEVER  NAUSEA AND VOMITING THAT IS NOT CONTROLLED WITH YOUR NAUSEA MEDICATION  *UNUSUAL SHORTNESS OF BREATH  *UNUSUAL BRUISING OR BLEEDING  TENDERNESS IN MOUTH AND THROAT WITH OR WITHOUT PRESENCE OF ULCERS  *URINARY PROBLEMS  *BOWEL PROBLEMS  UNUSUAL RASH Items with * indicate a potential emergency and should be followed up as soon as possible.  Feel free to call the clinic you have any questions or concerns. The clinic phone number is [phone].  Please show the CHEMO ALERT CARD at check-in to the Emergency Department and triage nurse.   

## 2017-02-05 ENCOUNTER — Encounter: Payer: BLUE CROSS/BLUE SHIELD | Admitting: Physical Therapy

## 2017-02-06 DIAGNOSIS — Z510 Encounter for antineoplastic radiation therapy: Secondary | ICD-10-CM | POA: Diagnosis not present

## 2017-02-08 ENCOUNTER — Ambulatory Visit: Payer: BLUE CROSS/BLUE SHIELD | Attending: Radiation Oncology | Admitting: Physical Therapy

## 2017-02-08 ENCOUNTER — Encounter: Payer: Self-pay | Admitting: Physical Therapy

## 2017-02-08 DIAGNOSIS — M25611 Stiffness of right shoulder, not elsewhere classified: Secondary | ICD-10-CM | POA: Diagnosis present

## 2017-02-08 DIAGNOSIS — M25511 Pain in right shoulder: Secondary | ICD-10-CM | POA: Insufficient documentation

## 2017-02-08 NOTE — Therapy (Addendum)
[place], [place], [address] Phone: [phone]   Fax:  [phone]  Physical Therapy Treatment  Patient Details  Name: Cynthia Chavez MRN: [id number] Date of Birth: [birth year] Referring Provider: Isidore Moos  Encounter Date: 02/08/2017      PT End of Session - 02/08/17 1605    Visit Number 5   Number of Visits 9   Date for PT Re-Evaluation 02/16/17   PT Start Time 1522   PT Stop Time 1603   PT Time Calculation (min) 41 min   Activity Tolerance Patient tolerated treatment well   Behavior During Therapy [facility] for tasks assessed/performed      Past Medical History:  Diagnosis Date  . Anxiety   . Cancer [facility])    breast cancer  . Depression   . Edema    bilateral feet and leg swelling  . GERD (gastroesophageal reflux disease)   . History of hiatal hernia   . Hypothyroidism   . Malaise   . Thyroid disease   . Varicose veins     Past Surgical History:  Procedure Laterality Date  . ABDOMINAL HYSTERECTOMY    . AXILLARY LYMPH NODE BIOPSY Right 01/03/2017   Procedure: RIGHT AXILLARY LYMPH NODE SEED GUIDED EXCISIONAL BIOPSY;  Surgeon: Rolm Bookbinder, MD;  Location: [place];  Service: General;  Laterality: Right;  . CHOLECYSTECTOMY    . COLONOSCOPY    . ESOPHAGEAL DILATION    . MASTECTOMY W/ SENTINEL NODE BIOPSY Right 01/03/2017   axillary  . MASTECTOMY W/ SENTINEL NODE BIOPSY Right 01/03/2017   Procedure: RIGHT TOTAL MASTECTOMY WITH RIGHT AXILLARY SENTINEL LYMPH NODE BIOPSY;  Surgeon: Rolm Bookbinder, MD;  Location: [place];  Service: General;  Laterality: Right;  . PORTACATH PLACEMENT Left 08/17/2016   Procedure: INSERTION PORT-A-CATH WITH [place];  Surgeon: Rolm Bookbinder, MD;  Location: [place];  Service: General;  Laterality: Left;    There were no vitals filed for this visit.      Subjective Assessment - 02/08/17 1524    Subjective My shoulder is still feeling tight where  that cord is. It runs down my arm. It still feels weird under my arm.    Pertinent History 07/27/16 Rt Biopsy 11oclock and 2 o clock: Grade 3 IDC with LVI; LN biopsy: Positive IDC; 11oclock: Er 100%, PR 90%, Ki 67: 90%; Her 2 Neg Heterogeneous; LN: Er 100%, PR: 90%; Ki67: 60%; Her 2 Pos Ratio 2.37; 2oclock: Er 100%, PR 90%; Ki 67: 90%; Her 2 Positive 2.52. 08/16/16-12/01/16 completed chemotherapy. 01/03/17 Right mastectomy: Multiple IDC with micropapillary features, LVI, grade 3, 1.5, 0.6, 0.5 cm, 12/20 lymph nodes positive for cancer, margins negative, ER 100%, PR 90%, HER-2 positive, Ki-67 60%,ypT1c ypN3 RCB class III; stage IIIB   Patient Stated Goals I want everything to heal so it doesn't pull or hurt when I move my arm   Currently in Pain? No/denies   Pain Score 0-No pain                         [facility] Adult PT Treatment/Exercise - 02/08/17 0001      Self-Care   Self-Care Other Self-Care Comments   Other Self-Care Comments  had pt fold ends of TG soft inward covering foam to reduce irritation     Manual Therapy   Manual Therapy Soft tissue mobilization;Passive ROM   Soft tissue mobilization to cording in right axilla   Myofascial Release brief crosshands  at right axilla from lower axilla to right upper arm with arm in abduction; right UE myofascial pulling   Passive ROM in supine to right shoulder for er, abduction, and flexion; also for horizontal abduction; then into position for radiation                        Long Term Clinic Goals - 01/31/17 1138      CC Long Term Goal  #1   Title Pt will be able to independently verbalize lymphedema risk reduction practices   Time 4   Period Weeks   Status On-going     CC Long Term Goal  #2   Title Pt will receive appropriate compression garments for long term management of edema   Baseline Pt going to get compression bra on 02/08/2017      CC Long Term Goal  #3   Title Pt to demonstrate 160 degrees of right  shoulder abduction to allow her to reach out to side and to obtain position necessary for radiation   Baseline 117 on eval,  129 on 01/31/2017   Time 4   Period Weeks   Status On-going     CC Long Term Goal  #4   Title Pt to demonstrate 160 degrees of right shoulder flexion to allow her to reach items overhead   Baseline 120 on eval,  135 on 01/31/2017   Period Weeks   Status On-going     CC Long Term Goal  #5   Title Pt to be independent in a home exercise program for continued strengthening and stretching   Time 4   Period Weeks   Status On-going            Plan - 02/08/17 1605    Clinical Impression Statement Focused today on R shoulder ROM and myofascial to cording since pt reports this was very beneficial. Cording was less visible at end of session with only 1 cord that was easily seen. The foam pad with TG soft was causing some irritation in her axilla so pt turned in the edges to see if this would decrease discomfort. Encouraged pt to continue dowel exercises and hold stretch at end range.    Rehab Potential Good   Clinical Impairments Affecting Rehab Potential pt will begin radiation in July   PT Duration 4 weeks   PT Next Visit Plan Assess skin and foam pads  continue gentle AAROM/AROM/PROM to R shoulder, MLD to right axillary swelling, myofascial to cording   PT Home Exercise Plan supine dowel exercises   Consulted and Agree with Plan of Care Patient      Patient will benefit from skilled therapeutic intervention in order to improve the following deficits and impairments:  Increased edema, Decreased knowledge of precautions, Impaired UE functional use, Decreased strength, Decreased range of motion, Decreased scar mobility, Postural dysfunction, Pain, Increased fascial restricitons  Visit Diagnosis: Stiffness of right shoulder, not elsewhere classified  Acute pain of right shoulder     Problem List Patient Active Problem List   Diagnosis Date Noted  . Port  catheter in place 09/08/2016  . Malignant neoplasm of upper-outer quadrant of right breast in female, estrogen receptor positive ([place]) 08/14/2016  . Breast cancer of upper-outer quadrant of right female breast ([place]) 08/13/2016  . Varicose veins of lower extremities with other complications 60/60/0459  . Hypothyroidism 04/24/2013    Allyson Sabal Blue 02/08/2017, 4:08 PM  [place]  [place], [place], [address] Phone: [phone]   Fax:  [phone]  Name: Cynthia Chavez MRN: [id number] Date of Birth: 08/29/1955  Manus Gunning, PT 02/08/17 4:08 PM  PHYSICAL THERAPY DISCHARGE SUMMARY  Visits from Start of Care: 5  Current functional level related to goals / functional outcomes: See above   Remaining deficits: See above   Education / Equipment: See above Plan: Patient agrees to discharge.  Patient goals were not met. Patient is being discharged due to not returning since the last visit.  ?????     Allyson Sabal [place], [name] 09/03/17 10:24 AM

## 2017-02-09 ENCOUNTER — Ambulatory Visit
Admission: RE | Admit: 2017-02-09 | Discharge: 2017-02-09 | Disposition: A | Discharge status: Home / Self Care | Payer: BLUE CROSS/BLUE SHIELD | Source: Ambulatory Visit | Attending: Radiation Oncology | Admitting: Radiation Oncology

## 2017-02-09 ENCOUNTER — Ambulatory Visit: Payer: BLUE CROSS/BLUE SHIELD | Admitting: Radiation Oncology

## 2017-02-09 DIAGNOSIS — Z510 Encounter for antineoplastic radiation therapy: Secondary | ICD-10-CM | POA: Diagnosis not present

## 2017-02-12 ENCOUNTER — Ambulatory Visit
Admission: RE | Admit: 2017-02-12 | Discharge: 2017-02-12 | Disposition: A | Discharge status: Home / Self Care | Payer: BLUE CROSS/BLUE SHIELD | Source: Ambulatory Visit | Attending: Radiation Oncology | Admitting: Radiation Oncology

## 2017-02-12 DIAGNOSIS — Z510 Encounter for antineoplastic radiation therapy: Secondary | ICD-10-CM | POA: Diagnosis not present

## 2017-02-12 DIAGNOSIS — C50411 Malignant neoplasm of upper-outer quadrant of right female breast: Secondary | ICD-10-CM

## 2017-02-12 DIAGNOSIS — Z170 Estrogen receptor positive status [ER+]: Principal | ICD-10-CM

## 2017-02-12 MED ORDER — ALRA NON-METALLIC DEODORANT (RAD-ONC)
1.0000 "application " | Freq: Once | TOPICAL | Status: AC
  Administered 2017-02-12: 1 via TOPICAL

## 2017-02-12 MED ORDER — RADIAPLEXRX EX GEL
Freq: Once | CUTANEOUS | Status: AC
  Administered 2017-02-12: 15:00:00 via TOPICAL

## 2017-02-12 NOTE — Progress Notes (Signed)
Pt here for patient teaching.  Pt given Radiation and You booklet, skin care instructions, Alra deodorant and Radiaplex gel. Reviewed areas of pertinence such as fatigue and skin changes . Pt able to give teach back of to pat skin and use unscented/gentle soap,apply Radiaplex bid and avoid applying anything to skin within 4 hours of treatment. Pt demonstrated understanding and verbalizes understanding of information given and will contact nursing with any questions or concerns.        

## 2017-02-13 ENCOUNTER — Ambulatory Visit: Payer: BLUE CROSS/BLUE SHIELD | Admitting: Physical Therapy

## 2017-02-13 ENCOUNTER — Telehealth: Payer: Self-pay | Admitting: Physical Therapy

## 2017-02-13 ENCOUNTER — Ambulatory Visit
Admission: RE | Admit: 2017-02-13 | Discharge: 2017-02-13 | Disposition: A | Discharge status: Home / Self Care | Payer: BLUE CROSS/BLUE SHIELD | Source: Ambulatory Visit | Attending: Radiation Oncology | Admitting: Radiation Oncology

## 2017-02-13 DIAGNOSIS — Z510 Encounter for antineoplastic radiation therapy: Secondary | ICD-10-CM | POA: Diagnosis not present

## 2017-02-13 NOTE — Telephone Encounter (Signed)
Called pt because she did not show up for her appointment. She states she forgot her appointment. Reminded pt of her appointment on the 24th and pt states she will call and see if she can get another appointment sooner.   Cynthia Chavez [place], [name] 02/13/17 3:54 PM

## 2017-02-14 ENCOUNTER — Ambulatory Visit
Admission: RE | Admit: 2017-02-14 | Discharge: 2017-02-14 | Disposition: A | Discharge status: Home / Self Care | Payer: BLUE CROSS/BLUE SHIELD | Source: Ambulatory Visit | Attending: Radiation Oncology | Admitting: Radiation Oncology

## 2017-02-14 DIAGNOSIS — Z510 Encounter for antineoplastic radiation therapy: Secondary | ICD-10-CM | POA: Diagnosis not present

## 2017-02-15 ENCOUNTER — Ambulatory Visit
Admission: RE | Admit: 2017-02-15 | Discharge: 2017-02-15 | Disposition: A | Discharge status: Home / Self Care | Payer: BLUE CROSS/BLUE SHIELD | Source: Ambulatory Visit | Attending: Radiation Oncology | Admitting: Radiation Oncology

## 2017-02-15 DIAGNOSIS — Z510 Encounter for antineoplastic radiation therapy: Secondary | ICD-10-CM | POA: Diagnosis not present

## 2017-02-16 ENCOUNTER — Ambulatory Visit
Admission: RE | Admit: 2017-02-16 | Discharge: 2017-02-16 | Disposition: A | Discharge status: Home / Self Care | Payer: BLUE CROSS/BLUE SHIELD | Source: Ambulatory Visit | Attending: Radiation Oncology | Admitting: Radiation Oncology

## 2017-02-16 DIAGNOSIS — Z510 Encounter for antineoplastic radiation therapy: Secondary | ICD-10-CM | POA: Diagnosis not present

## 2017-02-19 ENCOUNTER — Ambulatory Visit
Admission: RE | Admit: 2017-02-19 | Discharge: 2017-02-19 | Disposition: A | Discharge status: Home / Self Care | Payer: BLUE CROSS/BLUE SHIELD | Source: Ambulatory Visit | Attending: Radiation Oncology | Admitting: Radiation Oncology

## 2017-02-19 DIAGNOSIS — Z510 Encounter for antineoplastic radiation therapy: Secondary | ICD-10-CM | POA: Diagnosis not present

## 2017-02-20 ENCOUNTER — Telehealth: Payer: Self-pay | Admitting: Medical Oncology

## 2017-02-20 ENCOUNTER — Ambulatory Visit
Admission: RE | Admit: 2017-02-20 | Discharge: 2017-02-20 | Disposition: A | Discharge status: Home / Self Care | Payer: BLUE CROSS/BLUE SHIELD | Source: Ambulatory Visit | Attending: Radiation Oncology | Admitting: Radiation Oncology

## 2017-02-20 DIAGNOSIS — Z510 Encounter for antineoplastic radiation therapy: Secondary | ICD-10-CM | POA: Diagnosis not present

## 2017-02-20 NOTE — Telephone Encounter (Signed)
Pt asking for referral to another lymphedema provider. She said it takes too long to get in at [facility]

## 2017-02-21 ENCOUNTER — Ambulatory Visit
Admission: RE | Admit: 2017-02-21 | Discharge: 2017-02-21 | Disposition: A | Discharge status: Home / Self Care | Payer: BLUE CROSS/BLUE SHIELD | Source: Ambulatory Visit | Attending: Radiation Oncology | Admitting: Radiation Oncology

## 2017-02-21 DIAGNOSIS — Z510 Encounter for antineoplastic radiation therapy: Secondary | ICD-10-CM | POA: Diagnosis not present

## 2017-02-22 ENCOUNTER — Telehealth: Payer: Self-pay | Admitting: Physical Therapy

## 2017-02-22 ENCOUNTER — Ambulatory Visit
Admission: RE | Admit: 2017-02-22 | Discharge: 2017-02-22 | Disposition: A | Discharge status: Home / Self Care | Payer: BLUE CROSS/BLUE SHIELD | Source: Ambulatory Visit | Attending: Radiation Oncology | Admitting: Radiation Oncology

## 2017-02-22 DIAGNOSIS — Z510 Encounter for antineoplastic radiation therapy: Secondary | ICD-10-CM | POA: Diagnosis not present

## 2017-02-22 NOTE — Telephone Encounter (Signed)
Returned pt call.  She is concerned because she is getting fatigued from radiation and it is too much for her to drive from [place] to [place] twice a day. She wonders if she can postpone PT until after she is finished from radiation.  Advised pt that we do sometimes do that and it would be fine to do that in her case.  She does not have lymphedema, but does have cording and tightness in her axilla. We would not be able to do aggressive stretching to her skin until she is healed from radiation.  If she continues to be free of lymphedema and just needs continued work on scar tissue and shoulder rehab she may be able to be seen by our therapists in [place]. She will call back for appointment sometime after the week of August 27 for [place] to re-evaluate and make a plan at that time Donato Heinz. Owens Shark, PT

## 2017-02-23 ENCOUNTER — Other Ambulatory Visit: Payer: Self-pay | Admitting: *Deleted

## 2017-02-23 ENCOUNTER — Other Ambulatory Visit (HOSPITAL_BASED_OUTPATIENT_CLINIC_OR_DEPARTMENT_OTHER): Payer: BLUE CROSS/BLUE SHIELD

## 2017-02-23 ENCOUNTER — Ambulatory Visit (HOSPITAL_BASED_OUTPATIENT_CLINIC_OR_DEPARTMENT_OTHER): Payer: BLUE CROSS/BLUE SHIELD | Admitting: Hematology and Oncology

## 2017-02-23 ENCOUNTER — Ambulatory Visit
Admission: RE | Admit: 2017-02-23 | Discharge: 2017-02-23 | Disposition: A | Discharge status: Home / Self Care | Payer: BLUE CROSS/BLUE SHIELD | Source: Ambulatory Visit | Attending: Radiation Oncology | Admitting: Radiation Oncology

## 2017-02-23 ENCOUNTER — Encounter: Payer: Self-pay | Admitting: Hematology and Oncology

## 2017-02-23 ENCOUNTER — Ambulatory Visit (HOSPITAL_BASED_OUTPATIENT_CLINIC_OR_DEPARTMENT_OTHER): Payer: BLUE CROSS/BLUE SHIELD

## 2017-02-23 ENCOUNTER — Ambulatory Visit: Payer: BLUE CROSS/BLUE SHIELD

## 2017-02-23 DIAGNOSIS — Z170 Estrogen receptor positive status [ER+]: Secondary | ICD-10-CM | POA: Diagnosis not present

## 2017-02-23 DIAGNOSIS — C50411 Malignant neoplasm of upper-outer quadrant of right female breast: Secondary | ICD-10-CM

## 2017-02-23 DIAGNOSIS — Z510 Encounter for antineoplastic radiation therapy: Secondary | ICD-10-CM | POA: Diagnosis not present

## 2017-02-23 DIAGNOSIS — Z5112 Encounter for antineoplastic immunotherapy: Secondary | ICD-10-CM | POA: Diagnosis not present

## 2017-02-23 LAB — CBC WITH DIFFERENTIAL/PLATELET
BASO%: 0.4 % (ref 0.0–2.0)
BASOS ABS: 0 10*3/uL (ref 0.0–0.1)
EOS%: 5.3 % (ref 0.0–7.0)
Eosinophils Absolute: 0.3 10*3/uL (ref 0.0–0.5)
HEMATOCRIT: 36.9 % (ref 34.8–46.6)
HGB: 12.4 g/dL (ref 11.6–15.9)
LYMPH#: 1 10*3/uL (ref 0.9–3.3)
LYMPH%: 20.4 % (ref 14.0–49.7)
MCH: 34.9 pg — AB (ref 25.1–34.0)
MCHC: 33.7 g/dL (ref 31.5–36.0)
MCV: 103.7 fL — ABNORMAL HIGH (ref 79.5–101.0)
MONO#: 0.3 10*3/uL (ref 0.1–0.9)
MONO%: 5.9 % (ref 0.0–14.0)
NEUT#: 3.3 10*3/uL (ref 1.5–6.5)
NEUT%: 68 % (ref 38.4–76.8)
PLATELETS: 161 10*3/uL (ref 145–400)
RBC: 3.56 10*6/uL — AB (ref 3.70–5.45)
RDW: 12.5 % (ref 11.2–14.5)
WBC: 4.9 10*3/uL (ref 3.9–10.3)

## 2017-02-23 LAB — COMPREHENSIVE METABOLIC PANEL
ALT: 17 U/L (ref 0–55)
ANION GAP: 6 meq/L (ref 3–11)
AST: 17 U/L (ref 5–34)
Albumin: 3.7 g/dL (ref 3.5–5.0)
Alkaline Phosphatase: 87 U/L (ref 40–150)
BUN: 10.3 mg/dL (ref 7.0–26.0)
CALCIUM: 9.1 mg/dL (ref 8.4–10.4)
CHLORIDE: 107 meq/L (ref 98–109)
CO2: 27 meq/L (ref 22–29)
Creatinine: 0.7 mg/dL (ref 0.6–1.1)
EGFR: >90 mL/min/{1.73_m2} (ref >90)
Glucose: 93 mg/dl (ref 70–140)
POTASSIUM: 3.8 meq/L (ref 3.5–5.1)
Sodium: 140 mEq/L (ref 136–145)
Total Bilirubin: 0.41 mg/dL (ref 0.20–1.20)
Total Protein: 6.5 g/dL (ref 6.4–8.3)

## 2017-02-23 MED ORDER — TRASTUZUMAB CHEMO 150 MG IV SOLR
450.0000 mg | Freq: Once | INTRAVENOUS | Status: AC
  Administered 2017-02-23: 450 mg via INTRAVENOUS
  Filled 2017-02-23: qty 21.43

## 2017-02-23 MED ORDER — DIPHENHYDRAMINE HCL 25 MG PO CAPS
50.0000 mg | ORAL_CAPSULE | Freq: Once | ORAL | Status: AC
  Administered 2017-02-23: 25 mg via ORAL

## 2017-02-23 MED ORDER — ACETAMINOPHEN 325 MG PO TABS
650.0000 mg | ORAL_TABLET | Freq: Once | ORAL | Status: AC
  Administered 2017-02-23: 650 mg via ORAL

## 2017-02-23 MED ORDER — ACETAMINOPHEN 325 MG PO TABS
ORAL_TABLET | ORAL | Status: AC
  Filled 2017-02-23: qty 2

## 2017-02-23 MED ORDER — SODIUM CHLORIDE 0.9% FLUSH
10.0000 mL | INTRAVENOUS | Status: DC | PRN
  Administered 2017-02-23: 10 mL
  Filled 2017-02-23: qty 10

## 2017-02-23 MED ORDER — SODIUM CHLORIDE 0.9 % IV SOLN
Freq: Once | INTRAVENOUS | Status: AC
  Administered 2017-02-23: 12:00:00 via INTRAVENOUS

## 2017-02-23 MED ORDER — SODIUM CHLORIDE 0.9% FLUSH
10.0000 mL | INTRAVENOUS | Status: DC | PRN
  Administered 2017-02-23: 10 mL via INTRAVENOUS
  Filled 2017-02-23: qty 10

## 2017-02-23 MED ORDER — SODIUM CHLORIDE 0.9 % IV SOLN
420.0000 mg | Freq: Once | INTRAVENOUS | Status: AC
  Administered 2017-02-23: 420 mg via INTRAVENOUS
  Filled 2017-02-23: qty 14

## 2017-02-23 MED ORDER — DIPHENHYDRAMINE HCL 25 MG PO CAPS
ORAL_CAPSULE | ORAL | Status: AC
  Filled 2017-02-23: qty 1

## 2017-02-23 MED ORDER — HEPARIN SOD (PORK) LOCK FLUSH 100 UNIT/ML IV SOLN
500.0000 [IU] | Freq: Once | INTRAVENOUS | Status: AC | PRN
  Administered 2017-02-23: 500 [IU]
  Filled 2017-02-23: qty 5

## 2017-02-23 NOTE — Progress Notes (Signed)
Patient Care Team: [name], MD as PCP - General (Pediatrics)  DIAGNOSIS:  Encounter Diagnosis  Name Primary?  . Malignant neoplasm of upper-outer quadrant of right breast in female, estrogen receptor positive ([place])     SUMMARY OF ONCOLOGIC HISTORY:   Breast cancer of upper-outer quadrant of right female breast ([place])   07/24/2016 Mammogram    Rt Breast 2 masses at 11-12:30 Position spanning 8 cm; Additional mass at 2 o clock: 1.5 cm; LN with cort thickening 2.7 cm      07/27/2016 Initial Diagnosis    Rt Biopsy 11oclock and 2 o clock: Grade 3 IDC with LVI; LN biopsy: Positive IDC; 11oclock: Er 100%, PR 90%, Ki 67: 90%; Her 2 Neg Heterogeneous; LN: Er 100%, PR: 90%; Ki67: 60%; Her 2 Pos Ratio 2.37; 2oclock: Er 100%, PR 90%; Ki 67: 90%; Her 2 Positive 2.52      08/16/2016 - 12/01/2016 Neo-Adjuvant Chemotherapy    Neoadjuvant chemotherapy with TCH Perjeta (Taxotere d/ced after 2 cycles)      12/06/2016 Breast MRI    Residual right breast 2 o'clock enhancing mass which now measures 1.3 cm in greatest dimension.Residual non measurable scattered foci of heterogeneous enhancement throughout the superior and subareolar right breast, anterior and middle depth. Diminished right axillary lymphadenopathy      01/03/2017 Surgery    Right mastectomy: Multiple IDC with micropapillary features, LVI, grade 3, 1.5, 0.6, 0.5 cm, 12/20 lymph nodes positive for cancer, margins negative, ER 100%, PR 90%, HER-2 positive, Ki-67 60%,ypT1c ypN3 RCB class III; stage IIIB      02/12/2017 -  Radiation Therapy    Adjuvant radiation therapy       CHIEF COMPLIANT: Follow-up on Herceptin and Perjeta  INTERVAL HISTORY: Cynthia Chavez is a 53 year with above-mentioned history of right breast cancer treated with mastectomy and is currently on adjuvant Herceptin and Perjeta. She is tolerating it fairly well. 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 Denies any nausea vomiting. She is currently undergoing  radiation therapy. She is complaining of a headache lately.  REVIEW OF SYSTEMS:   Constitutional: Denies fevers, chills or abnormal weight loss Eyes: Denies blurriness of vision Ears, nose, mouth, throat, and face: Denies mucositis or sore throat Respiratory: Denies cough, dyspnea or wheezes Cardiovascular: Denies palpitation, chest discomfort Gastrointestinal:  Denies nausea, heartburn or change in bowel habits Skin: Denies abnormal skin rashes Lymphatics: Denies new lymphadenopathy or easy bruising Neurological:Denies numbness, tingling or new weaknesses Behavioral/Psych: Mood is stable, no new changes  Extremities: No lower extremity edema Breast:  denies any pain or lumps or nodules in either breasts All other systems were reviewed with the patient and are negative.  I have reviewed the past medical history, past surgical history, social history and family history with the patient and they are unchanged from previous note.  ALLERGIES:  is allergic to ivp dye [iodinated diagnostic agents]; other; dilaudid [hydromorphone hcl]; asa [aspirin]; and codeine.  MEDICATIONS:  Current Outpatient Prescriptions  Medication Sig Dispense Refill  . acetaminophen (TYLENOL) 500 MG tablet Take 1,000 mg by mouth every 4 (four) hours as needed for mild pain or moderate pain.    . cephALEXin (KEFLEX) 500 MG capsule Take 1 capsule (500 mg total) by mouth 4 (four) times daily. Take for 10 days. 40 capsule 0  . fluconazole (DIFLUCAN) 100 MG tablet Take 2 tabs today, then 1 tab daily x 4 more days. 6 tablet 0  . levothyroxine (SYNTHROID, LEVOTHROID) 125 MCG tablet Take 1  tablet (125 mcg total) by mouth daily before breakfast. 90 tablet 0  . lidocaine-prilocaine (EMLA) cream Apply to affected area once (Patient taking differently: Apply 1 application topically as needed (before treatments). Apply to affected area once) 30 g 3  . LORazepam (ATIVAN) 0.5 MG tablet Take 1 tablet (0.5 mg total) by mouth at  bedtime. As needed for sleep (Patient not taking: Reported on 01/17/2017) 30 tablet 0  . methocarbamol (ROBAXIN) 500 MG tablet Take 1 tablet (500 mg total) by mouth every 6 (six) hours as needed for muscle spasms. (Patient not taking: Reported on 01/17/2017) 20 tablet 0  . omeprazole (PRILOSEC) 20 MG capsule Take 20 mg by mouth daily as needed.    . ondansetron (ZOFRAN) 8 MG tablet Take 1 tablet (8 mg total) by mouth 2 (two) times daily as needed for refractory nausea / vomiting. Start on day 3 after chemo. (Patient not taking: Reported on 12/27/2016) 30 tablet 1  . oxyCODONE (OXY IR/ROXICODONE) 5 MG immediate release tablet Take 1-2 tablets (5-10 mg total) by mouth every 4 (four) hours as needed for moderate pain. (Patient not taking: Reported on 01/17/2017) 20 tablet 0  . prochlorperazine (COMPAZINE) 10 MG tablet Take 1 tablet (10 mg total) by mouth every 6 (six) hours as needed (Nausea or vomiting). (Patient not taking: Reported on 12/27/2016) 30 tablet 1   No current facility-administered medications for this visit.    Facility-Administered Medications Ordered in Other Visits  Medication Dose Route Frequency Provider Last Rate Last Dose  . sodium chloride flush (NS) 0.9 % injection 10 mL  10 mL Intravenous PRN Nicholas Lose, MD   10 mL at 02/23/17 1017    PHYSICAL EXAMINATION: ECOG PERFORMANCE STATUS: 1 - Symptomatic but completely ambulatory  Vitals:   02/23/17 1023  BP: 135/89  Pulse: 65  Resp: 20  Temp: 97.6 F (36.4 C)   Filed Weights   02/23/17 1023  Weight: 160 lb 8 oz (72.8 kg)    GENERAL:alert, no distress and comfortable SKIN: skin color, texture, turgor are normal, no rashes or significant lesions EYES: normal, Conjunctiva are pink and non-injected, sclera clear OROPHARYNX:no exudate, no erythema and lips, buccal mucosa, and tongue normal  NECK: supple, thyroid normal size, non-tender, without nodularity LYMPH:  no palpable lymphadenopathy in the cervical, axillary or  inguinal LUNGS: clear to auscultation and percussion with normal breathing effort HEART: regular rate & rhythm and no murmurs and no lower extremity edema ABDOMEN:abdomen soft, non-tender and normal bowel sounds MUSCULOSKELETAL:no cyanosis of digits and no clubbing  NEURO: alert & oriented x 3 with fluent speech, no focal motor/sensory deficits EXTREMITIES: No lower extremity edema   LABORATORY DATA:  I have reviewed the data as listed   Chemistry      Component Value Date/Time   NA 140 02/23/2017 0958   K 3.8 02/23/2017 0958   CL 107 08/14/2016 1605   CO2 27 02/23/2017 0958   BUN 10.3 02/23/2017 0958   CREATININE 0.7 02/23/2017 0958      Component Value Date/Time   CALCIUM 9.1 02/23/2017 0958   ALKPHOS 87 02/23/2017 0958   AST 17 02/23/2017 0958   ALT 17 02/23/2017 0958   BILITOT 0.41 02/23/2017 0958       Lab Results  Component Value Date   WBC 4.9 02/23/2017   HGB 12.4 02/23/2017   HCT 36.9 02/23/2017   MCV 103.7 (H) 02/23/2017   PLT 161 02/23/2017   NEUTROABS 3.3 02/23/2017    ASSESSMENT & PLAN:  Breast cancer of upper-outer quadrant of right female breast ([place]) Rt Biopsy 11oclock and 2 o clock: Grade 3 IDC with LVI; LN biopsy: Positive IDC; 11oclock: Er 100%, PR 90%, Ki 67: 90%; Her 2 Neg Heterogeneous; LN: Er 100%, PR: 90%; Ki67: 60%; Her 2 Pos Ratio 2.37; 2oclock: Er 100%, PR 90%; Ki 67: 90%; Her 2 Positive 2.52 Breast MRI on 08/13/2016: 8 cm span of breast cancer with multiple enlarged axillary lymph nodes. CT CAP: 08/17/16: 4 mm right middle lobe pulmonary nodule unlikely to be metastatic disease Bone scan 08/17/2016: No evidence of metastatic disease  Recommendationbased on multidisciplinary tumor board: 1. Neoadjuvant chemotherapy with TCH Perjeta 6 cycles followed by Herceptin Perjeta maintenance for 1 year 08/16/16 to 12/01/16 2. Followed by mastectomy with axillary node dissection 01/03/2017 3. Followed by adjuvant radiation therapy 4. Followed by  Anti-estrogen therapy 5. Followed by Neratinib for 1 year  -------------------------------------------------------------------------------------------------------------------------------------------------------------------------------------------------------------------------------------- 01/04/2007 Right mastectomy: Multiple IDC with micropapillary features, LVI, grade 3, 1.5, 0.6, 0.5 cm, 12/20 lymph nodes positive for cancer, margins negative, ER 100%, PR 90%, HER-2 positive, Ki-67 60%,ypT1c ypN3 RCB class III; stage IIIB  Adjuvant radiation therapy started 02/12/2017  Current treatment: Herceptin Perjeta maintenance Return to clinic every 3 weeks for Herceptin and Perjeta every 6 weeks for follow-up with me. Once she completes radiation therapy will start antiestrogen therapy. When I see her back in August I will start her on antiestrogen therapy  I spent 25 minutes talking to the patient of which more than half was spent in counseling and coordination of care.  No orders of the defined types were placed in this encounter.  The patient has a good understanding of the overall plan. she agrees with it. she will call with any problems that may develop before the next visit here.   Rulon Eisenmenger, MD 02/23/17

## 2017-02-23 NOTE — Assessment & Plan Note (Signed)
Rt Biopsy 11oclock and 2 o clock: Grade 3 IDC with LVI; LN biopsy: Positive IDC; 11oclock: Er 100%, PR 90%, Ki 67: 90%; Her 2 Neg Heterogeneous; LN: Er 100%, PR: 90%; Ki67: 60%; Her 2 Pos Ratio 2.37; 2oclock: Er 100%, PR 90%; Ki 67: 90%; Her 2 Positive 2.52 Breast MRI on 08/13/2016: 8 cm span of breast cancer with multiple enlarged axillary lymph nodes. CT CAP: 08/17/16: 4 mm right middle lobe pulmonary nodule unlikely to be metastatic disease Bone scan 08/17/2016: No evidence of metastatic disease  Recommendationbased on multidisciplinary tumor board: 1. Neoadjuvant chemotherapy with TCH Perjeta 6 cycles followed by Herceptin Perjeta maintenance for 1 year 08/16/16 to 12/01/16 2. Followed by mastectomy with axillary node dissection 01/03/2017 3. Followed by adjuvant radiation therapy 4. Followed by Anti-estrogen therapy 5. Followed by Neratinib for 1 year  -------------------------------------------------------------------------------------------------------------------------------------------------------------------------------------------------------------------------------------- 01/04/2007 Right mastectomy: Multiple IDC with micropapillary features, LVI, grade 3, 1.5, 0.6, 0.5 cm, 12/20 lymph nodes positive for cancer, margins negative, ER 100%, PR 90%, HER-2 positive, Ki-67 60%,ypT1c ypN3 RCB class III; stage IIIB  Adjuvant radiation therapy started 02/12/2017  Current treatment: Herceptin Perjeta maintenance Return to clinic every 3 weeks for Herceptin and Perjeta every 6 weeks for follow-up with me. Once she completes radiation therapy will start antiestrogen therapy.

## 2017-02-23 NOTE — Patient Instructions (Signed)
Cancer Center Discharge Instructions for Patients Receiving Chemotherapy  Today you received the following biotherapy agents: Herceptin and Perjeta If you develop nausea and vomiting that is not controlled by your nausea medication, call the clinic.   BELOW ARE SYMPTOMS THAT SHOULD BE REPORTED IMMEDIATELY:  *FEVER GREATER THAN 100.5 F  *CHILLS WITH OR WITHOUT FEVER  NAUSEA AND VOMITING THAT IS NOT CONTROLLED WITH YOUR NAUSEA MEDICATION  *UNUSUAL SHORTNESS OF BREATH  *UNUSUAL BRUISING OR BLEEDING  TENDERNESS IN MOUTH AND THROAT WITH OR WITHOUT PRESENCE OF ULCERS  *URINARY PROBLEMS  *BOWEL PROBLEMS  UNUSUAL RASH Items with * indicate a potential emergency and should be followed up as soon as possible.  Feel free to call the clinic should you have any questions or concerns. The clinic phone number is [phone].  Please show the CHEMO ALERT CARD at check-in to the Emergency Department and triage nurse.   

## 2017-02-26 ENCOUNTER — Ambulatory Visit
Admission: RE | Admit: 2017-02-26 | Discharge: 2017-02-26 | Disposition: A | Discharge status: Home / Self Care | Payer: BLUE CROSS/BLUE SHIELD | Source: Ambulatory Visit | Attending: Radiation Oncology | Admitting: Radiation Oncology

## 2017-02-26 DIAGNOSIS — Z510 Encounter for antineoplastic radiation therapy: Secondary | ICD-10-CM | POA: Diagnosis not present

## 2017-02-27 ENCOUNTER — Ambulatory Visit
Admission: RE | Admit: 2017-02-27 | Discharge: 2017-02-27 | Disposition: A | Discharge status: Home / Self Care | Payer: BLUE CROSS/BLUE SHIELD | Source: Ambulatory Visit | Attending: Radiation Oncology | Admitting: Radiation Oncology

## 2017-02-27 ENCOUNTER — Ambulatory Visit: Payer: BLUE CROSS/BLUE SHIELD | Admitting: Physical Therapy

## 2017-02-27 DIAGNOSIS — Z510 Encounter for antineoplastic radiation therapy: Secondary | ICD-10-CM | POA: Diagnosis not present

## 2017-02-28 ENCOUNTER — Ambulatory Visit
Admission: RE | Admit: 2017-02-28 | Discharge: 2017-02-28 | Disposition: A | Discharge status: Home / Self Care | Payer: BLUE CROSS/BLUE SHIELD | Source: Ambulatory Visit | Attending: Radiation Oncology | Admitting: Radiation Oncology

## 2017-02-28 DIAGNOSIS — Z510 Encounter for antineoplastic radiation therapy: Secondary | ICD-10-CM | POA: Diagnosis not present

## 2017-03-01 ENCOUNTER — Ambulatory Visit
Admission: RE | Admit: 2017-03-01 | Discharge: 2017-03-01 | Disposition: A | Discharge status: Home / Self Care | Payer: BLUE CROSS/BLUE SHIELD | Source: Ambulatory Visit | Attending: Radiation Oncology | Admitting: Radiation Oncology

## 2017-03-01 DIAGNOSIS — Z510 Encounter for antineoplastic radiation therapy: Secondary | ICD-10-CM | POA: Diagnosis not present

## 2017-03-02 ENCOUNTER — Ambulatory Visit
Admission: RE | Admit: 2017-03-02 | Discharge: 2017-03-02 | Disposition: A | Discharge status: Home / Self Care | Payer: BLUE CROSS/BLUE SHIELD | Source: Ambulatory Visit | Attending: Radiation Oncology | Admitting: Radiation Oncology

## 2017-03-02 DIAGNOSIS — Z510 Encounter for antineoplastic radiation therapy: Secondary | ICD-10-CM | POA: Diagnosis not present

## 2017-03-05 ENCOUNTER — Ambulatory Visit
Admission: RE | Admit: 2017-03-05 | Discharge: 2017-03-05 | Disposition: A | Discharge status: Home / Self Care | Payer: BLUE CROSS/BLUE SHIELD | Source: Ambulatory Visit | Attending: Radiation Oncology | Admitting: Radiation Oncology

## 2017-03-05 ENCOUNTER — Encounter: Payer: BLUE CROSS/BLUE SHIELD | Admitting: Physical Therapy

## 2017-03-05 DIAGNOSIS — Z510 Encounter for antineoplastic radiation therapy: Secondary | ICD-10-CM | POA: Diagnosis not present

## 2017-03-06 ENCOUNTER — Ambulatory Visit
Admission: RE | Admit: 2017-03-06 | Discharge: 2017-03-06 | Disposition: A | Discharge status: Home / Self Care | Payer: BLUE CROSS/BLUE SHIELD | Source: Ambulatory Visit | Attending: Radiation Oncology | Admitting: Radiation Oncology

## 2017-03-06 DIAGNOSIS — Z510 Encounter for antineoplastic radiation therapy: Secondary | ICD-10-CM | POA: Diagnosis not present

## 2017-03-07 ENCOUNTER — Ambulatory Visit
Admission: RE | Admit: 2017-03-07 | Discharge: 2017-03-07 | Disposition: A | Discharge status: Home / Self Care | Payer: BLUE CROSS/BLUE SHIELD | Source: Ambulatory Visit | Attending: Radiation Oncology | Admitting: Radiation Oncology

## 2017-03-07 DIAGNOSIS — Z510 Encounter for antineoplastic radiation therapy: Secondary | ICD-10-CM | POA: Diagnosis not present

## 2017-03-08 ENCOUNTER — Ambulatory Visit
Admission: RE | Admit: 2017-03-08 | Discharge: 2017-03-08 | Disposition: A | Discharge status: Home / Self Care | Payer: BLUE CROSS/BLUE SHIELD | Source: Ambulatory Visit | Attending: Radiation Oncology | Admitting: Radiation Oncology

## 2017-03-08 DIAGNOSIS — Z510 Encounter for antineoplastic radiation therapy: Secondary | ICD-10-CM | POA: Diagnosis not present

## 2017-03-09 ENCOUNTER — Ambulatory Visit
Admission: RE | Admit: 2017-03-09 | Discharge: 2017-03-09 | Disposition: A | Discharge status: Home / Self Care | Payer: BLUE CROSS/BLUE SHIELD | Source: Ambulatory Visit | Attending: Radiation Oncology | Admitting: Radiation Oncology

## 2017-03-09 ENCOUNTER — Ambulatory Visit: Payer: BLUE CROSS/BLUE SHIELD | Admitting: Radiation Oncology

## 2017-03-09 DIAGNOSIS — Z510 Encounter for antineoplastic radiation therapy: Secondary | ICD-10-CM | POA: Diagnosis not present

## 2017-03-12 ENCOUNTER — Ambulatory Visit
Admission: RE | Admit: 2017-03-12 | Payer: BLUE CROSS/BLUE SHIELD | Source: Ambulatory Visit | Admitting: Radiation Oncology

## 2017-03-12 ENCOUNTER — Ambulatory Visit
Admission: RE | Admit: 2017-03-12 | Discharge: 2017-03-12 | Disposition: A | Discharge status: Home / Self Care | Payer: BLUE CROSS/BLUE SHIELD | Source: Ambulatory Visit | Attending: Radiation Oncology | Admitting: Radiation Oncology

## 2017-03-12 ENCOUNTER — Encounter: Payer: Self-pay | Admitting: Adult Health

## 2017-03-12 ENCOUNTER — Ambulatory Visit (HOSPITAL_BASED_OUTPATIENT_CLINIC_OR_DEPARTMENT_OTHER): Payer: BLUE CROSS/BLUE SHIELD | Admitting: Adult Health

## 2017-03-12 ENCOUNTER — Ambulatory Visit: Payer: BLUE CROSS/BLUE SHIELD | Admitting: Radiation Oncology

## 2017-03-12 ENCOUNTER — Ambulatory Visit (HOSPITAL_BASED_OUTPATIENT_CLINIC_OR_DEPARTMENT_OTHER): Payer: BLUE CROSS/BLUE SHIELD

## 2017-03-12 ENCOUNTER — Other Ambulatory Visit: Payer: Self-pay

## 2017-03-12 VITALS — BP 138/84 | HR 79 | Temp 97.9°F | Resp 15 | Ht 67.0 in | Wt 158.4 lb

## 2017-03-12 DIAGNOSIS — C50411 Malignant neoplasm of upper-outer quadrant of right female breast: Secondary | ICD-10-CM

## 2017-03-12 DIAGNOSIS — Z79899 Other long term (current) drug therapy: Secondary | ICD-10-CM

## 2017-03-12 DIAGNOSIS — R031 Nonspecific low blood-pressure reading: Secondary | ICD-10-CM

## 2017-03-12 DIAGNOSIS — Z170 Estrogen receptor positive status [ER+]: Principal | ICD-10-CM

## 2017-03-12 DIAGNOSIS — Z95828 Presence of other vascular implants and grafts: Secondary | ICD-10-CM

## 2017-03-12 DIAGNOSIS — E039 Hypothyroidism, unspecified: Secondary | ICD-10-CM

## 2017-03-12 DIAGNOSIS — R51 Headache: Secondary | ICD-10-CM | POA: Diagnosis not present

## 2017-03-12 DIAGNOSIS — E860 Dehydration: Secondary | ICD-10-CM

## 2017-03-12 LAB — COMPREHENSIVE METABOLIC PANEL
ALT: 28 U/L (ref 0–55)
AST: 22 U/L (ref 5–34)
Albumin: 4 g/dL (ref 3.5–5.0)
Alkaline Phosphatase: 99 U/L (ref 40–150)
Anion Gap: 9 mEq/L (ref 3–11)
BUN: 11.7 mg/dL (ref 7.0–26.0)
CHLORIDE: 105 meq/L (ref 98–109)
CO2: 25 meq/L (ref 22–29)
Calcium: 9.6 mg/dL (ref 8.4–10.4)
Creatinine: 0.7 mg/dL (ref 0.6–1.1)
EGFR: 89 mL/min/{1.73_m2} — AB (ref >90)
GLUCOSE: 99 mg/dL (ref 70–140)
POTASSIUM: 4 meq/L (ref 3.5–5.1)
SODIUM: 139 meq/L (ref 136–145)
TOTAL PROTEIN: 7.1 g/dL (ref 6.4–8.3)
Total Bilirubin: 1.08 mg/dL (ref 0.20–1.20)

## 2017-03-12 LAB — URINALYSIS, MICROSCOPIC - CHCC
BILIRUBIN (URINE): NEGATIVE
Blood: NEGATIVE
Glucose: NEGATIVE mg/dL
KETONES: NEGATIVE mg/dL
NITRITE: NEGATIVE
Protein: NEGATIVE mg/dL
RBC / HPF: NEGATIVE (ref 0–2)
Specific Gravity, Urine: 1.005 (ref 1.003–1.035)
Urobilinogen, UR: 0.2 mg/dL (ref 0.2–1)
pH: 6 (ref 4.6–8.0)

## 2017-03-12 LAB — CBC WITH DIFFERENTIAL/PLATELET
BASO%: 0.4 % (ref 0.0–2.0)
BASOS ABS: 0 10*3/uL (ref 0.0–0.1)
EOS%: 3.7 % (ref 0.0–7.0)
Eosinophils Absolute: 0.2 10*3/uL (ref 0.0–0.5)
HEMATOCRIT: 41.6 % (ref 34.8–46.6)
HEMOGLOBIN: 13.7 g/dL (ref 11.6–15.9)
LYMPH#: 0.8 10*3/uL — AB (ref 0.9–3.3)
LYMPH%: 16.8 % (ref 14.0–49.7)
MCH: 34.2 pg — ABNORMAL HIGH (ref 25.1–34.0)
MCHC: 32.9 g/dL (ref 31.5–36.0)
MCV: 103.7 fL — AB (ref 79.5–101.0)
MONO#: 0.4 10*3/uL (ref 0.1–0.9)
MONO%: 8.5 % (ref 0.0–14.0)
NEUT#: 3.2 10*3/uL (ref 1.5–6.5)
NEUT%: 70.6 % (ref 38.4–76.8)
PLATELETS: 153 10*3/uL (ref 145–400)
RBC: 4.01 10*6/uL (ref 3.70–5.45)
RDW: 12.4 % (ref 11.2–14.5)
WBC: 4.6 10*3/uL (ref 3.9–10.3)

## 2017-03-12 MED ORDER — SODIUM CHLORIDE 0.9 % IV SOLN
Freq: Once | INTRAVENOUS | Status: AC
  Administered 2017-03-12: 16:00:00 via INTRAVENOUS

## 2017-03-12 MED ORDER — HEPARIN SOD (PORK) LOCK FLUSH 100 UNIT/ML IV SOLN
500.0000 [IU] | Freq: Once | INTRAVENOUS | Status: AC | PRN
  Administered 2017-03-12: 500 [IU] via INTRAVENOUS
  Filled 2017-03-12: qty 5

## 2017-03-12 MED ORDER — SODIUM CHLORIDE 0.9% FLUSH
10.0000 mL | INTRAVENOUS | Status: DC | PRN
  Administered 2017-03-12: 10 mL via INTRAVENOUS
  Filled 2017-03-12: qty 10

## 2017-03-12 NOTE — Progress Notes (Signed)
[facility] Follow up:    Cynthia Chavez, [place] [id number]   DIAGNOSIS: Cancer Staging No matching staging information was found for the patient.  SUMMARY OF ONCOLOGIC HISTORY:   Breast cancer of upper-outer quadrant of right female breast ([place])   07/24/2016 Mammogram    Rt Breast 2 masses at 11-12:30 Position spanning 8 cm; Additional mass at 2 o clock: 1.5 cm; LN with cort thickening 2.7 cm      07/27/2016 Initial Diagnosis    Rt Biopsy 11oclock and 2 o clock: Grade 3 IDC with LVI; LN biopsy: Positive IDC; 11oclock: Er 100%, PR 90%, Ki 67: 90%; Her 2 Neg Heterogeneous; LN: Er 100%, PR: 90%; Ki67: 60%; Her 2 Pos Ratio 2.37; 2oclock: Er 100%, PR 90%; Ki 67: 90%; Her 2 Positive 2.52      08/16/2016 - 12/01/2016 Neo-Adjuvant Chemotherapy    Neoadjuvant chemotherapy with TCH Perjeta (Taxotere d/ced after 2 cycles)      12/06/2016 Breast MRI    Residual right breast 2 o'clock enhancing mass which now measures 1.3 cm in greatest dimension.Residual non measurable scattered foci of heterogeneous enhancement throughout the superior and subareolar right breast, anterior and middle depth. Diminished right axillary lymphadenopathy      01/03/2017 Surgery    Right mastectomy: Multiple IDC with micropapillary features, LVI, grade 3, 1.5, 0.6, 0.5 cm, 12/20 lymph nodes positive for cancer, margins negative, ER 100%, PR 90%, HER-2 positive, Ki-67 60%,ypT1c ypN3 RCB class III; stage IIIB      02/12/2017 -  Radiation Therapy    Adjuvant radiation therapy       CURRENT THERAPY: Herceptin/Perjeta  INTERVAL HISTORY: Cynthia Chavez 61 y.o. female returns for urgent evaluation.  She noted over the weekend that she had increased headaches with bending over.  She has had terrible headaches.  She is concerned she is dehydrated.  She also has some dysuria and malodorous urine.  headaches are intermittent and 10/10, never had anything like this bad before.   Accompanied by room spinning sensation.  Headaches are worse in back of her head and are described as throbbing.  Tyleonol helped some.  Bending over made headache worse.  She does have decreased appetite.  She is drinking about one bottle of water per day.  She is not having any diarrhea.     Patient Active Problem List   Diagnosis Date Noted  . Port catheter in place 09/08/2016  . Malignant neoplasm of upper-outer quadrant of right breast in female, estrogen receptor positive ([place]) 08/14/2016  . Breast cancer of upper-outer quadrant of right female breast ([place]) 08/13/2016  . Varicose veins of lower extremities with other complications 84/13/2440  . Hypothyroidism 04/24/2013    is allergic to ivp dye [iodinated diagnostic agents]; other; dilaudid [hydromorphone hcl]; asa [aspirin]; and codeine.  MEDICAL HISTORY: Past Medical History:  Diagnosis Date  . Anxiety   . Cancer [facility])    breast cancer  . Depression   . Edema    bilateral feet and leg swelling  . GERD (gastroesophageal reflux disease)   . History of hiatal hernia   . Hypothyroidism   . Malaise   . Thyroid disease   . Varicose veins     SURGICAL HISTORY: Past Surgical History:  Procedure Laterality Date  . ABDOMINAL HYSTERECTOMY    . AXILLARY LYMPH NODE BIOPSY Right 01/03/2017   Procedure: RIGHT AXILLARY LYMPH NODE SEED GUIDED EXCISIONAL BIOPSY;  Surgeon: Rolm Bookbinder, MD;  Location: [facility] OR;  Service: General;  Laterality: Right;  . CHOLECYSTECTOMY    . COLONOSCOPY    . ESOPHAGEAL DILATION    . MASTECTOMY W/ SENTINEL NODE BIOPSY Right 01/03/2017   axillary  . MASTECTOMY W/ SENTINEL NODE BIOPSY Right 01/03/2017   Procedure: RIGHT TOTAL MASTECTOMY WITH RIGHT AXILLARY SENTINEL LYMPH NODE BIOPSY;  Surgeon: Rolm Bookbinder, MD;  Location: [facility];  Service: General;  Laterality: Right;  . PORTACATH PLACEMENT Left 08/17/2016   Procedure: INSERTION PORT-A-CATH WITH [place];  Surgeon: Rolm Bookbinder, MD;  Location: [facility];  Service: General;  Laterality: Left;    SOCIAL HISTORY: Social History   Social History  . Marital status: Married    Spouse name: N/A  . Number of children: N/A  . Years of education: N/A   Occupational History  . Not on file.   Social History Main Topics  . Smoking status: Current Every Day Smoker    Packs/day: 0.50    Years: 25.00    Types: Cigarettes    Start date: 04/19/1993  . Smokeless tobacco: Never Used     Comment: she stopped smoking for a long time, but started back.   . Alcohol use No  . Drug use: No  . Sexual activity: Not on file   Other Topics Concern  . Not on file   Social History Narrative  . No narrative on file    FAMILY HISTORY: Family History  Problem Relation Age of Onset  . COPD Father   . Cancer Father     Review of Systems  Constitutional: Positive for fatigue. Negative for appetite change, chills, diaphoresis, fever and unexpected weight change.  HENT:   Negative for hearing loss.   Eyes: Negative for eye problems and icterus.  Respiratory: Negative for chest tightness, cough and shortness of breath.   Cardiovascular: Negative for chest pain, leg swelling and palpitations.  Gastrointestinal: Negative for abdominal distention, abdominal pain, constipation, diarrhea, nausea and vomiting.  Endocrine: Negative for hot flashes.  Musculoskeletal: Negative for arthralgias.  Skin: Negative for itching and rash.  Neurological: Positive for dizziness and headaches. Negative for extremity weakness, light-headedness and numbness.  Hematological: Negative for adenopathy. Does not bruise/bleed easily.  Psychiatric/Behavioral: Negative for depression. The patient is not nervous/anxious.       PHYSICAL EXAMINATION  ECOG PERFORMANCE STATUS: 2 - Symptomatic, <50% confined to bed  Vitals:   03/12/17 1616 03/12/17 1703  BP: 114/82 138/84  Pulse: 96 79  Resp:  15  Temp:  97.9 F (36.6 C)   Orthostatic VS are + Physical  Exam  Constitutional: She is oriented to person, place, and time and well-developed, well-nourished, and in no distress.  HENT:  Head: Normocephalic and atraumatic.  Mouth/Throat: Oropharynx is clear and moist. No oropharyngeal exudate.  Eyes: Pupils are equal, round, and reactive to light. EOM are normal. No scleral icterus.  Neck: Neck supple.  Cardiovascular: Normal rate, regular rhythm and normal heart sounds.   Pulmonary/Chest: Effort normal and breath sounds normal. No respiratory distress. She has no wheezes.  Abdominal: Soft. Bowel sounds are normal. She exhibits no distension. There is no tenderness.  Musculoskeletal: She exhibits no edema.  Strength 5/5 x 4 extremities  Lymphadenopathy:    She has no cervical adenopathy.  Neurological: She is alert and oriented to person, place, and time. No cranial nerve deficit. Coordination normal.  Skin: Skin is warm and dry. No rash noted.  Psychiatric: Mood and affect normal.  LABORATORY DATA:  CBC    Component Value Date/Time   WBC 4.6 03/12/2017 1511   WBC 8.7 08/14/2016 1605   RBC 4.01 03/12/2017 1511   RBC 4.60 08/14/2016 1605   HGB 13.7 03/12/2017 1511   HCT 41.6 03/12/2017 1511   PLT 153 03/12/2017 1511   MCV 103.7 (H) 03/12/2017 1511   MCH 34.2 (H) 03/12/2017 1511   MCH 32.2 08/14/2016 1605   MCHC 32.9 03/12/2017 1511   MCHC 34.3 08/14/2016 1605   RDW 12.4 03/12/2017 1511   LYMPHSABS 0.8 (L) 03/12/2017 1511   MONOABS 0.4 03/12/2017 1511   EOSABS 0.2 03/12/2017 1511   BASOSABS 0.0 03/12/2017 1511    CMP     Component Value Date/Time   NA 139 03/12/2017 1511   K 4.0 03/12/2017 1511   CL 107 08/14/2016 1605   CO2 25 03/12/2017 1511   GLUCOSE 99 03/12/2017 1511   BUN 11.7 03/12/2017 1511   CREATININE 0.7 03/12/2017 1511   CALCIUM 9.6 03/12/2017 1511   PROT 7.1 03/12/2017 1511   ALBUMIN 4.0 03/12/2017 1511   AST 22 03/12/2017 1511   ALT 28 03/12/2017 1511   ALKPHOS 99 03/12/2017 1511   BILITOT 1.08  03/12/2017 1511   GFRNONAA >60 08/14/2016 1605   GFRAA >60 08/14/2016 1605        ASSESSMENT and PLAN:   Malignant neoplasm of upper-outer quadrant of right breast in female, estrogen receptor positive (HCC) Renesmay is feeling unwell today.  Her orthostatic VS are positive and her bp dropped from 388 systolic lying to 828 systolic standing.  She will receive IV fluids today.  She will monitor her headaches.  Should they worsen in any way she will let us know.  I encouraged her to increase her fluid intake and PO intake.  I reviewed red flags with her and reasons to call/go to ER in detail.    Josely will return on Friday for labs, evaluation, Herceptin/Perjeta   Orders Placed This Encounter  Procedures  . Urine Culture    Standing Status:   Future    Number of Occurrences:   1    Standing Expiration Date:   03/12/2018  . Urinalysis with microscopic    Standing Status:   Future    Number of Occurrences:   1    Standing Expiration Date:   03/12/2018    All questions were answered. The patient knows to call the clinic with any problems, questions or concerns. We can certainly see the patient much sooner if necessary.  A total of (30) minutes of face-to-face time was spent with this patient with greater than 50% of that time in counseling and care-coordination.  This note was electronically signed. Scot Dock, NP 03/12/2017

## 2017-03-12 NOTE — Progress Notes (Signed)
Cynthia Chavez was on treatment side for radiation and reported to Radiation Therapist that she was feeling very poorly since Saturday and was concerned about getting treatment today.  Patient reports pain on her head, sore to touch, reports dizziness with nausea.  She reports sleeping excessively this weekend and reports that she doesn't recall drinking any fluids yesterday and today one 1 glass of water which she states she had to force herself to drink.  Patient states she is very tired, denies having any falls, no new medications and does not take anything for her blood pressure.  Patient reports her urination has been uncomfortable and discomfort goes away when she finishes urinating, reports odor and dark urine.  Patient is alone for visit today and reports that she drove over here on her own.  Cynthia Chavez saw patient on treatment side but patient did not receive treatment.     Contacted Cynthia Chavez nurse, explained that since Cynthia Chavez does not have a provider today and Cynthia Chavez wanted patient to be assessed by [facility] or Cynthia Chavez.  Per May, Cynthia Chavez they will try to work patient into Cynthia Chavez schedule today.  Transported patient to registration for labs and office visit with Cynthia Chavez per his nurse as Cynthia Chavez is not open today.     See orthostatic vital signs  Vitals:   03/12/17 1446 03/12/17 1447  BP: (!) 151/90 (!) 155/97  Resp: 18   Temp: 98.1 F (36.7 C)   TempSrc: Oral   SpO2: 99%

## 2017-03-12 NOTE — Patient Instructions (Signed)
Dehydration, Adult Dehydration is when there is not enough fluid or water in your body. This happens when you lose more fluids than you take in. Dehydration can range from mild to very bad. It should be treated right away to keep it from getting very bad. Symptoms of mild dehydration may include:  Thirst.  Dry lips.  Slightly dry mouth.  Dry, warm skin.  Dizziness. Symptoms of moderate dehydration may include:  Very dry mouth.  Muscle cramps.  Dark pee (urine). Pee may be the color of tea.  Your body making less pee.  Your eyes making fewer tears.  Heartbeat that is uneven or faster than normal (palpitations).  Headache.  Light-headedness, especially when you stand up from sitting.  Fainting (syncope). Symptoms of very bad dehydration may include:  Changes in skin, such as: ? Cold and clammy skin. ? Blotchy (mottled) or pale skin. ? Skin that does not quickly return to normal after being lightly pinched and let go (poor skin turgor).  Changes in body fluids, such as: ? Feeling very thirsty. ? Your eyes making fewer tears. ? Not sweating when body temperature is high, such as in hot weather. ? Your body making very little pee.  Changes in vital signs, such as: ? Weak pulse. ? Pulse that is more than 100 beats a minute when you are sitting still. ? Fast breathing. ? Low blood pressure.  Other changes, such as: ? Sunken eyes. ? Cold hands and feet. ? Confusion. ? Lack of energy (lethargy). ? Trouble waking up from sleep. ? Short-term weight loss. ? Unconsciousness. Follow these instructions at home:  If told by your doctor, drink an ORS: ? Make an ORS by using instructions on the package. ? Start by drinking small amounts, about  cup (120 mL) every 5-10 minutes. ? Slowly drink more until you have had the amount that your doctor said to have.  Drink enough clear fluid to keep your pee clear or pale yellow. If you were told to drink an ORS, finish the ORS  first, then start slowly drinking clear fluids. Drink fluids such as: ? Water. Do not drink only water by itself. Doing that can make the salt (sodium) level in your body get too low (hyponatremia). ? Ice chips. ? Fruit juice that you have added water to (diluted). ? Low-calorie sports drinks.  Avoid: ? Alcohol. ? Drinks that have a lot of sugar. These include high-calorie sports drinks, fruit juice that does not have water added, and soda. ? Caffeine. ? Foods that are greasy or have a lot of fat or sugar.  Take over-the-counter and prescription medicines only as told by your doctor.  Do not take salt tablets. Doing that can make the salt level in your body get too high (hypernatremia).  Eat foods that have minerals (electrolytes). Examples include bananas, oranges, potatoes, tomatoes, and spinach.  Keep all follow-up visits as told by your doctor. This is important. Contact a doctor if:  You have belly (abdominal) pain that: ? Gets worse. ? Stays in one area (localizes).  You have a rash.  You have a stiff neck.  You get angry or annoyed more easily than normal (irritability).  You are more sleepy than normal.  You have a harder time waking up than normal.  You feel: ? Weak. ? Dizzy. ? Very thirsty.  You have peed (urinated) only a small amount of very dark pee during 6-8 hours. Get help right away if:  You have symptoms of   very bad dehydration.  You cannot drink fluids without throwing up (vomiting).  Your symptoms get worse with treatment.  You have a fever.  You have a very bad headache.  You are throwing up or having watery poop (diarrhea) and it: ? Gets worse. ? Does not go away.  You have blood or something green (bile) in your throw-up.  You have blood in your poop (stool). This may cause poop to look black and tarry.  You have not peed in 6-8 hours.  You pass out (faint).  Your heart rate when you are sitting still is more than 100 beats a  minute.  You have trouble breathing. This information is not intended to replace advice given to you by your health care provider. Make sure you discuss any questions you have with your health care provider. Document Released: 05/20/2009 Document Revised: 02/11/2016 Document Reviewed: 09/17/2015 Elsevier Interactive Patient Education  2018 [employer] Inc.  

## 2017-03-12 NOTE — Assessment & Plan Note (Signed)
Cynthia Chavez is feeling unwell today.  Her orthostatic VS are positive and her bp dropped from 616 systolic lying to 073 systolic standing.  She will receive IV fluids today.  She will monitor her headaches.  Should they worsen in any way she will let us know.  I encouraged her to increase her fluid intake and PO intake.  I reviewed red flags with her and reasons to call/go to ER in detail.    Cynthia Chavez will return on Friday for labs, evaluation, Herceptin/Perjeta

## 2017-03-13 ENCOUNTER — Ambulatory Visit
Admission: RE | Admit: 2017-03-13 | Discharge: 2017-03-13 | Disposition: A | Discharge status: Home / Self Care | Payer: BLUE CROSS/BLUE SHIELD | Source: Ambulatory Visit | Attending: Radiation Oncology | Admitting: Radiation Oncology

## 2017-03-13 DIAGNOSIS — Z510 Encounter for antineoplastic radiation therapy: Secondary | ICD-10-CM | POA: Diagnosis not present

## 2017-03-14 ENCOUNTER — Telehealth: Payer: Self-pay | Admitting: Emergency Medicine

## 2017-03-14 ENCOUNTER — Ambulatory Visit
Admission: RE | Admit: 2017-03-14 | Discharge: 2017-03-14 | Disposition: A | Discharge status: Home / Self Care | Payer: BLUE CROSS/BLUE SHIELD | Source: Ambulatory Visit | Attending: Radiation Oncology | Admitting: Radiation Oncology

## 2017-03-14 DIAGNOSIS — Z510 Encounter for antineoplastic radiation therapy: Secondary | ICD-10-CM | POA: Diagnosis not present

## 2017-03-14 NOTE — Telephone Encounter (Signed)
Called and left voicemail on patients home phone. No cell phone voicemail set up. Asked patient to call this nurse back regarding urine culture.

## 2017-03-14 NOTE — Telephone Encounter (Signed)
"  I missed a call from someone there.  No voicemail is set up on my mobile phone."  No communication or orders found to share at this time.  Urine culture results provided.  Call transferred for further communication or orders thru collaborative.

## 2017-03-14 NOTE — Telephone Encounter (Signed)
Patient called this nurse back. Reports no urinary symptoms at this time. Increased water intake and is feeling much better. Jamelle Haring, NP  recommends no antibiotics at this time.

## 2017-03-15 ENCOUNTER — Ambulatory Visit
Admission: RE | Admit: 2017-03-15 | Discharge: 2017-03-15 | Disposition: A | Discharge status: Home / Self Care | Payer: BLUE CROSS/BLUE SHIELD | Source: Ambulatory Visit | Attending: Radiation Oncology | Admitting: Radiation Oncology

## 2017-03-15 DIAGNOSIS — Z510 Encounter for antineoplastic radiation therapy: Secondary | ICD-10-CM | POA: Diagnosis not present

## 2017-03-15 LAB — URINE CULTURE

## 2017-03-16 ENCOUNTER — Ambulatory Visit
Admission: RE | Admit: 2017-03-16 | Discharge: 2017-03-16 | Disposition: A | Discharge status: Home / Self Care | Payer: BLUE CROSS/BLUE SHIELD | Source: Ambulatory Visit | Attending: Radiation Oncology | Admitting: Radiation Oncology

## 2017-03-16 ENCOUNTER — Ambulatory Visit (HOSPITAL_BASED_OUTPATIENT_CLINIC_OR_DEPARTMENT_OTHER): Payer: BLUE CROSS/BLUE SHIELD

## 2017-03-16 VITALS — BP 129/61 | HR 67 | Temp 98.6°F | Resp 16

## 2017-03-16 DIAGNOSIS — Z5112 Encounter for antineoplastic immunotherapy: Secondary | ICD-10-CM

## 2017-03-16 DIAGNOSIS — C50411 Malignant neoplasm of upper-outer quadrant of right female breast: Secondary | ICD-10-CM

## 2017-03-16 DIAGNOSIS — Z510 Encounter for antineoplastic radiation therapy: Secondary | ICD-10-CM | POA: Diagnosis not present

## 2017-03-16 DIAGNOSIS — Z170 Estrogen receptor positive status [ER+]: Principal | ICD-10-CM

## 2017-03-16 MED ORDER — SODIUM CHLORIDE 0.9% FLUSH
10.0000 mL | INTRAVENOUS | Status: DC | PRN
  Administered 2017-03-16: 10 mL
  Filled 2017-03-16: qty 10

## 2017-03-16 MED ORDER — SODIUM CHLORIDE 0.9 % IV SOLN
Freq: Once | INTRAVENOUS | Status: AC
  Administered 2017-03-16: 10:00:00 via INTRAVENOUS

## 2017-03-16 MED ORDER — SODIUM CHLORIDE 0.9 % IV SOLN
420.0000 mg | Freq: Once | INTRAVENOUS | Status: AC
  Administered 2017-03-16: 420 mg via INTRAVENOUS
  Filled 2017-03-16: qty 14

## 2017-03-16 MED ORDER — TRASTUZUMAB CHEMO 150 MG IV SOLR
450.0000 mg | Freq: Once | INTRAVENOUS | Status: AC
  Administered 2017-03-16: 450 mg via INTRAVENOUS
  Filled 2017-03-16: qty 21.43

## 2017-03-16 MED ORDER — ACETAMINOPHEN 325 MG PO TABS
650.0000 mg | ORAL_TABLET | Freq: Once | ORAL | Status: AC
Start: 2017-03-16 — End: 2017-03-16
  Administered 2017-03-16: 650 mg via ORAL

## 2017-03-16 MED ORDER — DIPHENHYDRAMINE HCL 25 MG PO CAPS
50.0000 mg | ORAL_CAPSULE | Freq: Once | ORAL | Status: AC
  Administered 2017-03-16: 25 mg via ORAL

## 2017-03-16 MED ORDER — HEPARIN SOD (PORK) LOCK FLUSH 100 UNIT/ML IV SOLN
500.0000 [IU] | Freq: Once | INTRAVENOUS | Status: AC | PRN
  Administered 2017-03-16: 500 [IU]
  Filled 2017-03-16: qty 5

## 2017-03-16 MED ORDER — DIPHENHYDRAMINE HCL 25 MG PO CAPS
ORAL_CAPSULE | ORAL | Status: AC
  Filled 2017-03-16: qty 1

## 2017-03-16 MED ORDER — ACETAMINOPHEN 325 MG PO TABS
ORAL_TABLET | ORAL | Status: AC
  Filled 2017-03-16: qty 2

## 2017-03-16 NOTE — Patient Instructions (Signed)
[facility] Discharge Instructions for Patients Receiving Chemotherapy  Today you received the following biotherapy agents: Herceptin and Perjeta If you develop nausea and vomiting that is not controlled by your nausea medication, call the clinic.   BELOW ARE SYMPTOMS THAT SHOULD BE REPORTED IMMEDIATELY:  *FEVER GREATER THAN 100.5 F  *CHILLS WITH OR WITHOUT FEVER  NAUSEA AND VOMITING THAT IS NOT CONTROLLED WITH YOUR NAUSEA MEDICATION  *UNUSUAL SHORTNESS OF BREATH  *UNUSUAL BRUISING OR BLEEDING  TENDERNESS IN MOUTH AND THROAT WITH OR WITHOUT PRESENCE OF ULCERS  *URINARY PROBLEMS  *BOWEL PROBLEMS  UNUSUAL RASH Items with * indicate a potential emergency and should be followed up as soon as possible.  Feel free to call the clinic should you have any questions or concerns. The clinic phone number is [phone].  Please show the CHEMO ALERT CARD at check-in to the Emergency Department and triage nurse.   

## 2017-03-19 ENCOUNTER — Ambulatory Visit: Payer: BLUE CROSS/BLUE SHIELD

## 2017-03-19 ENCOUNTER — Ambulatory Visit
Admission: RE | Admit: 2017-03-19 | Discharge: 2017-03-19 | Disposition: A | Discharge status: Home / Self Care | Payer: BLUE CROSS/BLUE SHIELD | Source: Ambulatory Visit | Attending: Radiation Oncology | Admitting: Radiation Oncology

## 2017-03-19 DIAGNOSIS — C50411 Malignant neoplasm of upper-outer quadrant of right female breast: Secondary | ICD-10-CM

## 2017-03-19 DIAGNOSIS — Z170 Estrogen receptor positive status [ER+]: Principal | ICD-10-CM

## 2017-03-19 DIAGNOSIS — Z510 Encounter for antineoplastic radiation therapy: Secondary | ICD-10-CM | POA: Diagnosis not present

## 2017-03-19 MED ORDER — SONAFINE EX EMUL
1.0000 "application " | Freq: Once | CUTANEOUS | Status: AC
  Administered 2017-03-19: 1 via TOPICAL

## 2017-03-20 ENCOUNTER — Ambulatory Visit
Admission: RE | Admit: 2017-03-20 | Discharge: 2017-03-20 | Disposition: A | Discharge status: Home / Self Care | Payer: BLUE CROSS/BLUE SHIELD | Source: Ambulatory Visit | Attending: Radiation Oncology | Admitting: Radiation Oncology

## 2017-03-20 ENCOUNTER — Ambulatory Visit: Payer: BLUE CROSS/BLUE SHIELD

## 2017-03-20 DIAGNOSIS — Z510 Encounter for antineoplastic radiation therapy: Secondary | ICD-10-CM | POA: Diagnosis not present

## 2017-03-21 ENCOUNTER — Ambulatory Visit
Admission: RE | Admit: 2017-03-21 | Discharge: 2017-03-21 | Disposition: A | Discharge status: Home / Self Care | Payer: BLUE CROSS/BLUE SHIELD | Source: Ambulatory Visit | Attending: Radiation Oncology | Admitting: Radiation Oncology

## 2017-03-21 ENCOUNTER — Ambulatory Visit: Payer: BLUE CROSS/BLUE SHIELD

## 2017-03-21 DIAGNOSIS — Z510 Encounter for antineoplastic radiation therapy: Secondary | ICD-10-CM | POA: Diagnosis not present

## 2017-03-22 ENCOUNTER — Ambulatory Visit
Admission: RE | Admit: 2017-03-22 | Discharge: 2017-03-22 | Disposition: A | Discharge status: Home / Self Care | Payer: BLUE CROSS/BLUE SHIELD | Source: Ambulatory Visit | Attending: Radiation Oncology | Admitting: Radiation Oncology

## 2017-03-22 ENCOUNTER — Ambulatory Visit: Payer: BLUE CROSS/BLUE SHIELD

## 2017-03-22 DIAGNOSIS — Z510 Encounter for antineoplastic radiation therapy: Secondary | ICD-10-CM | POA: Diagnosis not present

## 2017-03-23 ENCOUNTER — Ambulatory Visit
Admission: RE | Admit: 2017-03-23 | Discharge: 2017-03-23 | Disposition: A | Discharge status: Home / Self Care | Payer: BLUE CROSS/BLUE SHIELD | Source: Ambulatory Visit | Attending: Radiation Oncology | Admitting: Radiation Oncology

## 2017-03-23 ENCOUNTER — Ambulatory Visit: Payer: BLUE CROSS/BLUE SHIELD

## 2017-03-23 DIAGNOSIS — Z510 Encounter for antineoplastic radiation therapy: Secondary | ICD-10-CM | POA: Diagnosis not present

## 2017-03-26 ENCOUNTER — Ambulatory Visit
Admission: RE | Admit: 2017-03-26 | Discharge: 2017-03-26 | Disposition: A | Discharge status: Home / Self Care | Payer: BLUE CROSS/BLUE SHIELD | Source: Ambulatory Visit | Attending: Radiation Oncology | Admitting: Radiation Oncology

## 2017-03-26 DIAGNOSIS — Z510 Encounter for antineoplastic radiation therapy: Secondary | ICD-10-CM | POA: Diagnosis not present

## 2017-03-27 ENCOUNTER — Encounter: Payer: Self-pay | Admitting: Radiation Oncology

## 2017-03-27 NOTE — Progress Notes (Signed)
  Radiation Oncology         [phone] ________________________________  Name: Cynthia Chavez MRN: [id number]  Date: 03/27/2017  DOB: 05-Aug-1956  End of Treatment Note  Diagnosis: Stage IIIB ypT1c ypN3 cM0 Right Breast UIQ Invasive Ductal Carcinoma, ER Positive / PR Positive / Her2 Positive, Grade 3     Indication for treatment:  Curative  Radiation treatment dates:   02/12/17-03/26/17  Site/dose:   1) Chest wall and regional nodes, right- 50 Gy in 25 fractions  2) Chest wall, boost, right -10 Gy in 5 fractions   Beams/energy:   1) 3D / 10X, 6X, 15X 2) electrons / 6MeV   Narrative: The patient tolerated radiation treatment relatively well.  She initially was without complaint, however, as her treatment progressed she gradually developed pain and radiation related skin changes to the right chest wall, SCV area, and right axillary area. She was given Radiaplex, Sonafine, and was using aloe vera to remedy this. She was otherwise without notable fatigue, or any other complaints.   Plan: The patient has completed radiation treatment. The patient will return to radiation oncology clinic for routine followup in one month. I advised them to call or return sooner if they have any questions or concerns related to their recovery or treatment.  -----------------------------------  Eppie Gibson, MD  This document serves as a record of services personally performed by Eppie Gibson, MD. It was created on his behalf by Reola Mosher, a trained medical scribe. The creation of this record is based on the scribe's personal observations and the provider's statements to them. This document has been checked and approved by the attending provider.

## 2017-04-06 ENCOUNTER — Encounter: Payer: Self-pay | Admitting: Hematology and Oncology

## 2017-04-06 ENCOUNTER — Ambulatory Visit (HOSPITAL_BASED_OUTPATIENT_CLINIC_OR_DEPARTMENT_OTHER): Payer: BLUE CROSS/BLUE SHIELD

## 2017-04-06 ENCOUNTER — Ambulatory Visit: Payer: BLUE CROSS/BLUE SHIELD

## 2017-04-06 ENCOUNTER — Other Ambulatory Visit (HOSPITAL_BASED_OUTPATIENT_CLINIC_OR_DEPARTMENT_OTHER): Payer: BLUE CROSS/BLUE SHIELD

## 2017-04-06 ENCOUNTER — Ambulatory Visit (HOSPITAL_BASED_OUTPATIENT_CLINIC_OR_DEPARTMENT_OTHER): Payer: BLUE CROSS/BLUE SHIELD | Admitting: Hematology and Oncology

## 2017-04-06 VITALS — BP 121/70 | HR 78 | Temp 98.3°F | Resp 18

## 2017-04-06 DIAGNOSIS — Z5112 Encounter for antineoplastic immunotherapy: Secondary | ICD-10-CM

## 2017-04-06 DIAGNOSIS — Z170 Estrogen receptor positive status [ER+]: Principal | ICD-10-CM

## 2017-04-06 DIAGNOSIS — Z95828 Presence of other vascular implants and grafts: Secondary | ICD-10-CM

## 2017-04-06 DIAGNOSIS — C50411 Malignant neoplasm of upper-outer quadrant of right female breast: Secondary | ICD-10-CM

## 2017-04-06 LAB — COMPREHENSIVE METABOLIC PANEL
ALT: 17 U/L (ref 0–55)
ANION GAP: 5 meq/L (ref 3–11)
AST: 17 U/L (ref 5–34)
Albumin: 3.5 g/dL (ref 3.5–5.0)
Alkaline Phosphatase: 96 U/L (ref 40–150)
BILIRUBIN TOTAL: 0.46 mg/dL (ref 0.20–1.20)
BUN: 12.3 mg/dL (ref 7.0–26.0)
CALCIUM: 9.3 mg/dL (ref 8.4–10.4)
CHLORIDE: 108 meq/L (ref 98–109)
CO2: 28 meq/L (ref 22–29)
CREATININE: 0.7 mg/dL (ref 0.6–1.1)
Glucose: 102 mg/dl (ref 70–140)
Potassium: 4 mEq/L (ref 3.5–5.1)
Sodium: 141 mEq/L (ref 136–145)
TOTAL PROTEIN: 6.7 g/dL (ref 6.4–8.3)

## 2017-04-06 LAB — CBC WITH DIFFERENTIAL/PLATELET
BASO%: 0.6 % (ref 0.0–2.0)
Basophils Absolute: 0 10*3/uL (ref 0.0–0.1)
EOS ABS: 0.2 10*3/uL (ref 0.0–0.5)
EOS%: 4.8 % (ref 0.0–7.0)
HCT: 38 % (ref 34.8–46.6)
HGB: 12.8 g/dL (ref 11.6–15.9)
LYMPH%: 15.8 % (ref 14.0–49.7)
MCH: 33.9 pg (ref 25.1–34.0)
MCHC: 33.6 g/dL (ref 31.5–36.0)
MCV: 100.9 fL (ref 79.5–101.0)
MONO#: 0.3 10*3/uL (ref 0.1–0.9)
MONO%: 6.8 % (ref 0.0–14.0)
NEUT#: 3.2 10*3/uL (ref 1.5–6.5)
NEUT%: 72 % (ref 38.4–76.8)
PLATELETS: 158 10*3/uL (ref 145–400)
RBC: 3.77 10*6/uL (ref 3.70–5.45)
RDW: 12.5 % (ref 11.2–14.5)
WBC: 4.4 10*3/uL (ref 3.9–10.3)
lymph#: 0.7 10*3/uL — ABNORMAL LOW (ref 0.9–3.3)

## 2017-04-06 MED ORDER — HEPARIN SOD (PORK) LOCK FLUSH 100 UNIT/ML IV SOLN
500.0000 [IU] | Freq: Once | INTRAVENOUS | Status: AC | PRN
  Administered 2017-04-06: 500 [IU]
  Filled 2017-04-06: qty 5

## 2017-04-06 MED ORDER — ANASTROZOLE 1 MG PO TABS: 1.0000 mg | ORAL_TABLET | Freq: Every day | ORAL | 3 refills | Status: DC

## 2017-04-06 MED ORDER — SODIUM CHLORIDE 0.9 % IV SOLN
420.0000 mg | Freq: Once | INTRAVENOUS | Status: AC
  Administered 2017-04-06: 420 mg via INTRAVENOUS
  Filled 2017-04-06: qty 14

## 2017-04-06 MED ORDER — SODIUM CHLORIDE 0.9% FLUSH
10.0000 mL | INTRAVENOUS | Status: DC | PRN
  Administered 2017-04-06: 10 mL via INTRAVENOUS
  Filled 2017-04-06: qty 10

## 2017-04-06 MED ORDER — ACETAMINOPHEN 325 MG PO TABS
650.0000 mg | ORAL_TABLET | Freq: Once | ORAL | Status: AC
  Administered 2017-04-06: 650 mg via ORAL

## 2017-04-06 MED ORDER — TRASTUZUMAB CHEMO 150 MG IV SOLR
450.0000 mg | Freq: Once | INTRAVENOUS | Status: AC
  Administered 2017-04-06: 450 mg via INTRAVENOUS
  Filled 2017-04-06: qty 21.43

## 2017-04-06 MED ORDER — SODIUM CHLORIDE 0.9 % IV SOLN
Freq: Once | INTRAVENOUS | Status: AC
  Administered 2017-04-06: 12:00:00 via INTRAVENOUS

## 2017-04-06 MED ORDER — DIPHENHYDRAMINE HCL 25 MG PO CAPS
50.0000 mg | ORAL_CAPSULE | Freq: Once | ORAL | Status: AC
  Administered 2017-04-06: 25 mg via ORAL

## 2017-04-06 MED ORDER — SODIUM CHLORIDE 0.9% FLUSH
10.0000 mL | INTRAVENOUS | Status: DC | PRN
  Administered 2017-04-06: 10 mL
  Filled 2017-04-06: qty 10

## 2017-04-06 MED ORDER — ACETAMINOPHEN 325 MG PO TABS
ORAL_TABLET | ORAL | Status: AC
  Filled 2017-04-06: qty 2

## 2017-04-06 MED ORDER — DIPHENHYDRAMINE HCL 25 MG PO CAPS
ORAL_CAPSULE | ORAL | Status: AC
  Filled 2017-04-06: qty 1

## 2017-04-06 NOTE — Progress Notes (Signed)
Patient Care Team: [name], MD as PCP - General (Pediatrics)  DIAGNOSIS:  Encounter Diagnosis  Name Primary?  . Malignant neoplasm of upper-outer quadrant of right breast in female, estrogen receptor positive ([place])     SUMMARY OF ONCOLOGIC HISTORY:   Breast cancer of upper-outer quadrant of right female breast ([place])   07/24/2016 Mammogram    Rt Breast 2 masses at 11-12:30 Position spanning 8 cm; Additional mass at 2 o clock: 1.5 cm; LN with cort thickening 2.7 cm      07/27/2016 Initial Diagnosis    Rt Biopsy 11oclock and 2 o clock: Grade 3 IDC with LVI; LN biopsy: Positive IDC; 11oclock: Er 100%, PR 90%, Ki 67: 90%; Her 2 Neg Heterogeneous; LN: Er 100%, PR: 90%; Ki67: 60%; Her 2 Pos Ratio 2.37; 2oclock: Er 100%, PR 90%; Ki 67: 90%; Her 2 Positive 2.52      08/16/2016 - 12/01/2016 Neo-Adjuvant Chemotherapy    Neoadjuvant chemotherapy with TCH Perjeta (Taxotere d/ced after 2 cycles)      12/06/2016 Breast MRI    Residual right breast 2 o'clock enhancing mass which now measures 1.3 cm in greatest dimension.Residual non measurable scattered foci of heterogeneous enhancement throughout the superior and subareolar right breast, anterior and middle depth. Diminished right axillary lymphadenopathy      01/03/2017 Surgery    Right mastectomy: Multiple IDC with micropapillary features, LVI, grade 3, 1.5, 0.6, 0.5 cm, 12/20 lymph nodes positive for cancer, margins negative, ER 100%, PR 90%, HER-2 positive, Ki-67 60%,ypT1c ypN3 RCB class III; stage IIIB      02/12/2017 - 03/26/2017 Radiation Therapy    Adjuvant radiation therapy      04/06/2017 -  Anti-estrogen oral therapy    Anastrozole 1 mg by mouth daily       CHIEF COMPLIANT: Follow-up of Herceptin and Perjeta, completed radiation therapy  INTERVAL HISTORY: Cynthia Chavez is a 61 year old with above-mentioned right breast cancer treated with neoadjuvant chemotherapy followed by mastectomy and radiation. She is here for  Herceptin and Perjeta maintenance. She is also here to discuss starting antiestrogen therapy with anastrozole. She has radiation dermatitis and it is causing her significant pain and discomfort. She continues to have some tingling and numbness of her hands or feet. She does feel fatigue. Denies any diarrhea.  REVIEW OF SYSTEMS:   Constitutional: Denies fevers, chills or abnormal weight loss Eyes: Denies blurriness of vision Ears, nose, mouth, throat, and face: Denies mucositis or sore throat Respiratory: Denies cough, dyspnea or wheezes Cardiovascular: Denies palpitation, chest discomfort Gastrointestinal:  Denies nausea, heartburn or change in bowel habits Skin: Denies abnormal skin rashes Lymphatics: Denies new lymphadenopathy or easy bruising Neurological:Denies numbness, tingling or new weaknesses Behavioral/Psych: Mood is stable, no new changes  Extremities: No lower extremity edema  All other systems were reviewed with the patient and are negative.  I have reviewed the past medical history, past surgical history, social history and family history with the patient and they are unchanged from previous note.  ALLERGIES:  is allergic to ivp dye [iodinated diagnostic agents]; other; dilaudid [hydromorphone hcl]; asa [aspirin]; and codeine.  MEDICATIONS:  Current Outpatient Prescriptions  Medication Sig Dispense Refill  . acetaminophen (TYLENOL) 500 MG tablet Take 1,000 mg by mouth every 4 (four) hours as needed for mild pain or moderate pain.    . cephALEXin (KEFLEX) 500 MG capsule Take 1 capsule (500 mg total) by mouth 4 (four) times daily. Take for 10 days. 40 capsule 0  .  fluconazole (DIFLUCAN) 100 MG tablet Take 2 tabs today, then 1 tab daily x 4 more days. 6 tablet 0  . levothyroxine (SYNTHROID, LEVOTHROID) 125 MCG tablet Take 1 tablet (125 mcg total) by mouth daily before breakfast. 90 tablet 0  . lidocaine-prilocaine (EMLA) cream Apply to affected area once (Patient taking  differently: Apply 1 application topically as needed (before treatments). Apply to affected area once) 30 g 3  . LORazepam (ATIVAN) 0.5 MG tablet Take 1 tablet (0.5 mg total) by mouth at bedtime. As needed for sleep (Patient not taking: Reported on 01/17/2017) 30 tablet 0  . methocarbamol (ROBAXIN) 500 MG tablet Take 1 tablet (500 mg total) by mouth every 6 (six) hours as needed for muscle spasms. (Patient not taking: Reported on 01/17/2017) 20 tablet 0  . omeprazole (PRILOSEC) 20 MG capsule Take 20 mg by mouth daily as needed.    . ondansetron (ZOFRAN) 8 MG tablet Take 1 tablet (8 mg total) by mouth 2 (two) times daily as needed for refractory nausea / vomiting. Start on day 3 after chemo. (Patient not taking: Reported on 12/27/2016) 30 tablet 1  . oxyCODONE (OXY IR/ROXICODONE) 5 MG immediate release tablet Take 1-2 tablets (5-10 mg total) by mouth every 4 (four) hours as needed for moderate pain. (Patient not taking: Reported on 01/17/2017) 20 tablet 0  . prochlorperazine (COMPAZINE) 10 MG tablet Take 1 tablet (10 mg total) by mouth every 6 (six) hours as needed (Nausea or vomiting). (Patient not taking: Reported on 12/27/2016) 30 tablet 1   No current facility-administered medications for this visit.     PHYSICAL EXAMINATION: ECOG PERFORMANCE STATUS: 1 - Symptomatic but completely ambulatory  Vitals:   04/06/17 1045  BP: 113/83  Pulse: 72  Resp: 18  Temp: 98 F (36.7 C)  SpO2: 100%   Filed Weights   04/06/17 1045  Weight: 162 lb (73.5 kg)    GENERAL:alert, no distress and comfortable SKIN: skin color, texture, turgor are normal, no rashes or significant lesions EYES: normal, Conjunctiva are pink and non-injected, sclera clear OROPHARYNX:no exudate, no erythema and lips, buccal mucosa, and tongue normal  NECK: supple, thyroid normal size, non-tender, without nodularity LYMPH:  no palpable lymphadenopathy in the cervical, axillary or inguinal LUNGS: clear to auscultation and percussion  with normal breathing effort HEART: regular rate & rhythm and no murmurs and no lower extremity edema ABDOMEN:abdomen soft, non-tender and normal bowel sounds MUSCULOSKELETAL:no cyanosis of digits and no clubbing  NEURO: alert & oriented x 3 with fluent speech, no focal motor/sensory deficits EXTREMITIES: No lower extremity edema  LABORATORY DATA:  I have reviewed the data as listed   Chemistry      Component Value Date/Time   NA 141 04/06/2017 0957   K 4.0 04/06/2017 0957   CL 107 08/14/2016 1605   CO2 28 04/06/2017 0957   BUN 12.3 04/06/2017 0957   CREATININE 0.7 04/06/2017 0957      Component Value Date/Time   CALCIUM 9.3 04/06/2017 0957   ALKPHOS 96 04/06/2017 0957   AST 17 04/06/2017 0957   ALT 17 04/06/2017 0957   BILITOT 0.46 04/06/2017 0957       Lab Results  Component Value Date   WBC 4.4 04/06/2017   HGB 12.8 04/06/2017   HCT 38.0 04/06/2017   MCV 100.9 04/06/2017   PLT 158 04/06/2017   NEUTROABS 3.2 04/06/2017    ASSESSMENT & PLAN:  Breast cancer of upper-outer quadrant of right female breast ([place]) Rt Biopsy 11oclock and  2 o clock: Grade 3 IDC with LVI; LN biopsy: Positive IDC; 11oclock: Er 100%, PR 90%, Ki 67: 90%; Her 2 Neg Heterogeneous; LN: Er 100%, PR: 90%; Ki67: 60%; Her 2 Pos Ratio 2.37; 2oclock: Er 100%, PR 90%; Ki 67: 90%; Her 2 Positive 2.52 Breast MRI on 08/13/2016: 8 cm span of breast cancer with multiple enlarged axillary lymph nodes. CT CAP: 08/17/16: 4 mm right middle lobe pulmonary nodule unlikely to be metastatic disease Bone scan 08/17/2016: No evidence of metastatic disease  Recommendationbased on multidisciplinary tumor board: 1. Neoadjuvant chemotherapy with TCH Perjeta 6 cycles followed by Herceptin Perjetamaintenance for 1 year 08/16/16 to 12/01/16 2. Followed by mastectomy with axillary node dissection 01/03/2017 3. Adjuvant radiation therapy completed 03/26/2017 4. Followed by Anti-estrogen therapy started 04/06/2017 5. Followed by  Neratinib for 1 year -------------------------------------------------------------------------------------------------------------------------------------------------------------------------------------------------------------------------------------- 01/04/2007 Right mastectomy: Multiple IDC with micropapillary features, LVI, grade 3, 1.5, 0.6, 0.5 cm, 12/20 lymph nodes positive for cancer, margins negative, ER 100%, PR 90%, HER-2 positive, Ki-67 60%,ypT1c ypN3 RCB class III; stage IIIB  Adjuvant radiation therapy started 02/12/2017 completed 03/26/2017  Current treatment: Herceptin Perjeta maintenance to be completed in December 2018 I recommended starting antiestrogen therapy with anastrozole 1 mg by mouth daily. Anastrozole counseling:We discussed the risks and benefits of anti-estrogen therapy with aromatase inhibitors. These include but not limited to insomnia, hot flashes, mood changes, vaginal dryness, bone density loss, and weight gain. We strongly believe that the benefits far outweigh the risks. Patient understands these risks and consented to starting treatment. Planned treatment duration is 7 years.  Return to clinic every 3 weeks for Herceptin and Perjeta every 6 weeks for follow-up with me.   I spent 25 minutes talking to the patient of which more than half was spent in counseling and coordination of care.  No orders of the defined types were placed in this encounter.  The patient has a good understanding of the overall plan. she agrees with it. she will call with any problems that may develop before the next visit here.   Rulon Eisenmenger, MD 04/06/17

## 2017-04-06 NOTE — Assessment & Plan Note (Signed)
Rt Biopsy 11oclock and 2 o clock: Grade 3 IDC with LVI; LN biopsy: Positive IDC; 11oclock: Er 100%, PR 90%, Ki 67: 90%; Her 2 Neg Heterogeneous; LN: Er 100%, PR: 90%; Ki67: 60%; Her 2 Pos Ratio 2.37; 2oclock: Er 100%, PR 90%; Ki 67: 90%; Her 2 Positive 2.52 Breast MRI on 08/13/2016: 8 cm span of breast cancer with multiple enlarged axillary lymph nodes. CT CAP: 08/17/16: 4 mm right middle lobe pulmonary nodule unlikely to be metastatic disease Bone scan 08/17/2016: No evidence of metastatic disease  Recommendationbased on multidisciplinary tumor board: 1. Neoadjuvant chemotherapy with TCH Perjeta 6 cycles followed by Herceptin Perjetamaintenance for 1 year 08/16/16 to 12/01/16 2. Followed by mastectomy with axillary node dissection 01/03/2017 3. Adjuvant radiation therapy completed 03/26/2017 4. Followed by Anti-estrogen therapy started 04/06/2017 5. Followed by Neratinib for 1 year -------------------------------------------------------------------------------------------------------------------------------------------------------------------------------------------------------------------------------------- 01/04/2007 Right mastectomy: Multiple IDC with micropapillary features, LVI, grade 3, 1.5, 0.6, 0.5 cm, 12/20 lymph nodes positive for cancer, margins negative, ER 100%, PR 90%, HER-2 positive, Ki-67 60%,ypT1c ypN3 RCB class III; stage IIIB  Adjuvant radiation therapy started 02/12/2017 completed 03/26/2017  Current treatment: Herceptin Perjeta maintenance I recommended starting antiestrogen therapy with anastrozole 1 mg by mouth daily. Anastrozole counseling:We discussed the risks and benefits of anti-estrogen therapy with aromatase inhibitors. These include but not limited to insomnia, hot flashes, mood changes, vaginal dryness, bone density loss, and weight gain. We strongly believe that the benefits far outweigh the risks. Patient understands these risks and consented to starting  treatment. Planned treatment duration is 5-7 years.  Return to clinic every 3 weeks for Herceptin and Perjeta every 6 weeks for follow-up with me.

## 2017-04-17 ENCOUNTER — Other Ambulatory Visit: Payer: Self-pay | Admitting: Pediatrics

## 2017-04-17 DIAGNOSIS — E039 Hypothyroidism, unspecified: Secondary | ICD-10-CM

## 2017-04-18 NOTE — Telephone Encounter (Signed)
Patient aware and will call back to make apt.

## 2017-04-26 NOTE — Assessment & Plan Note (Signed)
Rt Biopsy 11oclock and 2 o clock: Grade 3 IDC with LVI; LN biopsy: Positive IDC; 11oclock: Er 100%, PR 90%, Ki 67: 90%; Her 2 Neg Heterogeneous; LN: Er 100%, PR: 90%; Ki67: 60%; Her 2 Pos Ratio 2.37; 2oclock: Er 100%, PR 90%; Ki 67: 90%; Her 2 Positive 2.52 Breast MRI on 08/13/2016: 8 cm span of breast cancer with multiple enlarged axillary lymph nodes. CT CAP: 08/17/16: 4 mm right middle lobe pulmonary nodule unlikely to be metastatic disease Bone scan 08/17/2016: No evidence of metastatic disease  Recommendationbased on multidisciplinary tumor board: 1. Neoadjuvant chemotherapy with TCH Perjeta 6 cycles followed by Herceptin Perjetamaintenance for 1 year 08/16/16 to 12/01/16 2. Followed by mastectomy with axillary node dissection 01/03/2017 3. Adjuvant radiation therapy completed 03/26/2017 4. Followed by Anti-estrogen therapy started 04/06/2017 5. Followed by Neratinib for 1 year -------------------------------------------------------------------------------------------------------------------------------------------------------------------------------------------------------------------------------------- 01/04/2007 Right mastectomy: Multiple IDC with micropapillary features, LVI, grade 3, 1.5, 0.6, 0.5 cm, 12/20 lymph nodes positive for cancer, margins negative, ER 100%, PR 90%, HER-2 positive, Ki-67 60%,ypT1c ypN3 RCB class III; stage IIIB  Adjuvant radiation therapy started 02/12/2017 completed 03/26/2017  Current treatment: Herceptin Perjeta maintenance to be completed in December 2018 Anastrozole started 04/06/17  Anastrozole Toxicities:

## 2017-04-26 NOTE — Progress Notes (Signed)
Patient Care Team: [name], MD as PCP - General (Pediatrics)  DIAGNOSIS:  Encounter Diagnosis  Name Primary?  . Malignant neoplasm of upper-outer quadrant of right breast in female, estrogen receptor positive ([place])     SUMMARY OF ONCOLOGIC HISTORY:   Breast cancer of upper-outer quadrant of right female breast ([place])   07/24/2016 Mammogram    Rt Breast 2 masses at 11-12:30 Position spanning 8 cm; Additional mass at 2 o clock: 1.5 cm; LN with cort thickening 2.7 cm      07/27/2016 Initial Diagnosis    Rt Biopsy 11oclock and 2 o clock: Grade 3 IDC with LVI; LN biopsy: Positive IDC; 11oclock: Er 100%, PR 90%, Ki 67: 90%; Her 2 Neg Heterogeneous; LN: Er 100%, PR: 90%; Ki67: 60%; Her 2 Pos Ratio 2.37; 2oclock: Er 100%, PR 90%; Ki 67: 90%; Her 2 Positive 2.52      08/16/2016 - 12/01/2016 Neo-Adjuvant Chemotherapy    Neoadjuvant chemotherapy with TCH Perjeta (Taxotere d/ced after 2 cycles)      12/06/2016 Breast MRI    Residual right breast 2 o'clock enhancing mass which now measures 1.3 cm in greatest dimension.Residual non measurable scattered foci of heterogeneous enhancement throughout the superior and subareolar right breast, anterior and middle depth. Diminished right axillary lymphadenopathy      01/03/2017 Surgery    Right mastectomy: Multiple IDC with micropapillary features, LVI, grade 3, 1.5, 0.6, 0.5 cm, 12/20 lymph nodes positive for cancer, margins negative, ER 100%, PR 90%, HER-2 positive, Ki-67 60%,ypT1c ypN3 RCB class III; stage IIIB      02/12/2017 - 03/26/2017 Radiation Therapy    Adjuvant radiation therapy      04/06/2017 -  Anti-estrogen oral therapy    Anastrozole 1 mg by mouth daily       CHIEF COMPLIANT: Follow-up on Herceptin Perjeta and anastrozole  INTERVAL HISTORY: Cynthia Chavez is a 61 year old with above-mentioned history of right breast cancer treated with neoadjuvant chemotherapy with [place] and is currently on adjuvant therapy with  Herceptin Perjeta maintenance which will be completed in December. She is also currently on anastrozole therapy. We started this on 04/06/2017. She does not complain of any hot flashes or myalgias. She appears to be tolerating it very well. She has some weird dreams at night. Denies any diarrhea.  REVIEW OF SYSTEMS:   Constitutional: Denies fevers, chills or abnormal weight loss Eyes: Denies blurriness of vision Ears, nose, mouth, throat, and face: Denies mucositis or sore throat Respiratory: Denies cough, dyspnea or wheezes Cardiovascular: Denies palpitation, chest discomfort Gastrointestinal:  Denies nausea, heartburn or change in bowel habits Skin: Denies abnormal skin rashes Lymphatics: Denies new lymphadenopathy or easy bruising Neurological: Peripheral neuropathy from prior chemotherapy Behavioral/Psych: Mood is stable, no new changes  Extremities: No lower extremity edema Breast:  denies any pain or lumps or nodules in either breasts All other systems were reviewed with the patient and are negative.  I have reviewed the past medical history, past surgical history, social history and family history with the patient and they are unchanged from previous note.  ALLERGIES:  is allergic to ivp dye [iodinated diagnostic agents]; other; dilaudid [hydromorphone hcl]; asa [aspirin]; and codeine.  MEDICATIONS:  Current Outpatient Prescriptions  Medication Sig Dispense Refill  . anastrozole (ARIMIDEX) 1 MG tablet Take 1 tablet (1 mg total) by mouth daily. 90 tablet 3  . levothyroxine (SYNTHROID, LEVOTHROID) 125 MCG tablet TAKE 1 TABLET BY MOUTH ONCE DAILY BEFORE BREAKFAST 30 tablet 0  .  lidocaine-prilocaine (EMLA) cream Apply to affected area once (Patient taking differently: Apply 1 application topically as needed (before treatments). Apply to affected area once) 30 g 3   No current facility-administered medications for this visit.     PHYSICAL EXAMINATION: ECOG PERFORMANCE STATUS: 1 -  Symptomatic but completely ambulatory  Vitals:   04/27/17 0841  BP: 130/74  Pulse: 70  Resp: 18  Temp: 98.1 F (36.7 C)  SpO2: 100%   Filed Weights   04/27/17 0841  Weight: 163 lb 1.6 oz (74 kg)    GENERAL:alert, no distress and comfortable SKIN: skin color, texture, turgor are normal, no rashes or significant lesions EYES: normal, Conjunctiva are pink and non-injected, sclera clear OROPHARYNX:no exudate, no erythema and lips, buccal mucosa, and tongue normal  NECK: supple, thyroid normal size, non-tender, without nodularity LYMPH:  no palpable lymphadenopathy in the cervical, axillary or inguinal LUNGS: clear to auscultation and percussion with normal breathing effort HEART: regular rate & rhythm and no murmurs and no lower extremity edema ABDOMEN:abdomen soft, non-tender and normal bowel sounds MUSCULOSKELETAL:no cyanosis of digits and no clubbing  NEURO: alert & oriented x 3 with fluent speech, grade 1 peripheral neuropathy EXTREMITIES: No lower extremity edema  LABORATORY DATA:  I have reviewed the data as listed   Chemistry      Component Value Date/Time   NA 141 04/06/2017 0957   K 4.0 04/06/2017 0957   CL 107 08/14/2016 1605   CO2 28 04/06/2017 0957   BUN 12.3 04/06/2017 0957   CREATININE 0.7 04/06/2017 0957      Component Value Date/Time   CALCIUM 9.3 04/06/2017 0957   ALKPHOS 96 04/06/2017 0957   AST 17 04/06/2017 0957   ALT 17 04/06/2017 0957   BILITOT 0.46 04/06/2017 0957       Lab Results  Component Value Date   WBC 4.6 04/27/2017   HGB 13.9 04/27/2017   HCT 40.9 04/27/2017   MCV 99.7 04/27/2017   PLT 166 04/27/2017   NEUTROABS 3.3 04/27/2017    ASSESSMENT & PLAN:  Breast cancer of upper-outer quadrant of right female breast ([place]) Rt Biopsy 11oclock and 2 o clock: Grade 3 IDC with LVI; LN biopsy: Positive IDC; 11oclock: Er 100%, PR 90%, Ki 67: 90%; Her 2 Neg Heterogeneous; LN: Er 100%, PR: 90%; Ki67: 60%; Her 2 Pos Ratio 2.37; 2oclock: Er  100%, PR 90%; Ki 67: 90%; Her 2 Positive 2.52 Breast MRI on 08/13/2016: 8 cm span of breast cancer with multiple enlarged axillary lymph nodes. CT CAP: 08/17/16: 4 mm right middle lobe pulmonary nodule unlikely to be metastatic disease Bone scan 08/17/2016: No evidence of metastatic disease  Recommendationbased on multidisciplinary tumor board: 1. Neoadjuvant chemotherapy with TCH Perjeta 6 cycles followed by Herceptin Perjetamaintenance for 1 year 08/16/16 to 12/01/16 2. Followed by mastectomy with axillary node dissection 01/03/2017 3. Adjuvant radiation therapy completed 03/26/2017 4. Followed by Anti-estrogen therapy started 04/06/2017 5. Followed by Neratinib for 1 year -------------------------------------------------------------------------------------------------------------------------------------------------------------------------------------------------------------------------------------- 01/04/2007 Right mastectomy: Multiple IDC with micropapillary features, LVI, grade 3, 1.5, 0.6, 0.5 cm, 12/20 lymph nodes positive for cancer, margins negative, ER 100%, PR 90%, HER-2 positive, Ki-67 60%,ypT1c ypN3 RCB class III; stage IIIB  Adjuvant radiation therapy started 02/12/2017 completed 03/26/2017  Current treatment: Herceptin Perjeta maintenance to be completed in December 2018 Anastrozole started 04/06/17  Anastrozole Toxicities:  Return to clinic every 3 weeks for Herceptin and Perjeta every 6 weeks for follow-up with me.  I spent 25 minutes talking to the patient of  which more than half was spent in counseling and coordination of care.  No orders of the defined types were placed in this encounter.  The patient has a good understanding of the overall plan. she agrees with it. she will call with any problems that may develop before the next visit here.   Rulon Eisenmenger, MD 04/27/17

## 2017-04-27 ENCOUNTER — Other Ambulatory Visit: Payer: BLUE CROSS/BLUE SHIELD

## 2017-04-27 ENCOUNTER — Ambulatory Visit (HOSPITAL_BASED_OUTPATIENT_CLINIC_OR_DEPARTMENT_OTHER): Payer: BLUE CROSS/BLUE SHIELD

## 2017-04-27 ENCOUNTER — Ambulatory Visit: Payer: BLUE CROSS/BLUE SHIELD | Admitting: Hematology and Oncology

## 2017-04-27 ENCOUNTER — Ambulatory Visit (HOSPITAL_BASED_OUTPATIENT_CLINIC_OR_DEPARTMENT_OTHER): Payer: BLUE CROSS/BLUE SHIELD | Admitting: Hematology and Oncology

## 2017-04-27 ENCOUNTER — Telehealth: Payer: Self-pay | Admitting: Hematology and Oncology

## 2017-04-27 ENCOUNTER — Encounter: Payer: Self-pay | Admitting: Radiation Oncology

## 2017-04-27 ENCOUNTER — Other Ambulatory Visit (HOSPITAL_BASED_OUTPATIENT_CLINIC_OR_DEPARTMENT_OTHER): Payer: BLUE CROSS/BLUE SHIELD

## 2017-04-27 VITALS — BP 119/71 | HR 75 | Temp 98.0°F | Resp 18

## 2017-04-27 DIAGNOSIS — Z170 Estrogen receptor positive status [ER+]: Secondary | ICD-10-CM | POA: Diagnosis not present

## 2017-04-27 DIAGNOSIS — Z5112 Encounter for antineoplastic immunotherapy: Secondary | ICD-10-CM

## 2017-04-27 DIAGNOSIS — C50411 Malignant neoplasm of upper-outer quadrant of right female breast: Secondary | ICD-10-CM

## 2017-04-27 LAB — CBC WITH DIFFERENTIAL/PLATELET
BASO%: 0.6 % (ref 0.0–2.0)
BASOS ABS: 0 10*3/uL (ref 0.0–0.1)
EOS%: 5.5 % (ref 0.0–7.0)
Eosinophils Absolute: 0.3 10*3/uL (ref 0.0–0.5)
HEMATOCRIT: 40.9 % (ref 34.8–46.6)
HGB: 13.9 g/dL (ref 11.6–15.9)
LYMPH#: 0.7 10*3/uL — AB (ref 0.9–3.3)
LYMPH%: 16.2 % (ref 14.0–49.7)
MCH: 33.8 pg (ref 25.1–34.0)
MCHC: 33.9 g/dL (ref 31.5–36.0)
MCV: 99.7 fL (ref 79.5–101.0)
MONO#: 0.3 10*3/uL (ref 0.1–0.9)
MONO%: 6.4 % (ref 0.0–14.0)
NEUT#: 3.3 10*3/uL (ref 1.5–6.5)
NEUT%: 71.3 % (ref 38.4–76.8)
Platelets: 166 10*3/uL (ref 145–400)
RBC: 4.1 10*6/uL (ref 3.70–5.45)
RDW: 12.7 % (ref 11.2–14.5)
WBC: 4.6 10*3/uL (ref 3.9–10.3)

## 2017-04-27 LAB — COMPREHENSIVE METABOLIC PANEL
ALT: 15 U/L (ref 0–55)
ANION GAP: 8 meq/L (ref 3–11)
AST: 18 U/L (ref 5–34)
Albumin: 3.6 g/dL (ref 3.5–5.0)
Alkaline Phosphatase: 100 U/L (ref 40–150)
BUN: 10.8 mg/dL (ref 7.0–26.0)
CALCIUM: 9.4 mg/dL (ref 8.4–10.4)
CHLORIDE: 106 meq/L (ref 98–109)
CO2: 27 mEq/L (ref 22–29)
Creatinine: 0.8 mg/dL (ref 0.6–1.1)
EGFR: 84 mL/min/{1.73_m2} — AB (ref >90)
Glucose: 96 mg/dl (ref 70–140)
POTASSIUM: 4 meq/L (ref 3.5–5.1)
Sodium: 141 mEq/L (ref 136–145)
Total Bilirubin: 0.65 mg/dL (ref 0.20–1.20)
Total Protein: 6.9 g/dL (ref 6.4–8.3)

## 2017-04-27 MED ORDER — SODIUM CHLORIDE 0.9 % IV SOLN
Freq: Once | INTRAVENOUS | Status: AC
  Administered 2017-04-27: 09:00:00 via INTRAVENOUS

## 2017-04-27 MED ORDER — SODIUM CHLORIDE 0.9% FLUSH
10.0000 mL | INTRAVENOUS | Status: DC | PRN
  Administered 2017-04-27: 10 mL
  Filled 2017-04-27: qty 10

## 2017-04-27 MED ORDER — DIPHENHYDRAMINE HCL 25 MG PO CAPS
50.0000 mg | ORAL_CAPSULE | Freq: Once | ORAL | Status: AC
  Administered 2017-04-27: 25 mg via ORAL

## 2017-04-27 MED ORDER — SODIUM CHLORIDE 0.9 % IV SOLN
450.0000 mg | Freq: Once | INTRAVENOUS | Status: AC
  Administered 2017-04-27: 450 mg via INTRAVENOUS
  Filled 2017-04-27: qty 21.43

## 2017-04-27 MED ORDER — ACETAMINOPHEN 325 MG PO TABS
ORAL_TABLET | ORAL | Status: AC
  Filled 2017-04-27: qty 2

## 2017-04-27 MED ORDER — ACETAMINOPHEN 325 MG PO TABS
650.0000 mg | ORAL_TABLET | Freq: Once | ORAL | Status: AC
  Administered 2017-04-27: 650 mg via ORAL

## 2017-04-27 MED ORDER — HEPARIN SOD (PORK) LOCK FLUSH 100 UNIT/ML IV SOLN
500.0000 [IU] | Freq: Once | INTRAVENOUS | Status: AC | PRN
  Administered 2017-04-27: 500 [IU]
  Filled 2017-04-27: qty 5

## 2017-04-27 MED ORDER — SODIUM CHLORIDE 0.9 % IV SOLN
420.0000 mg | Freq: Once | INTRAVENOUS | Status: AC
  Administered 2017-04-27: 420 mg via INTRAVENOUS
  Filled 2017-04-27: qty 14

## 2017-04-27 MED ORDER — DIPHENHYDRAMINE HCL 25 MG PO CAPS
ORAL_CAPSULE | ORAL | Status: AC
  Filled 2017-04-27: qty 1

## 2017-04-27 NOTE — Progress Notes (Signed)
Okay to treat today with 01-22-17 Echo, per May, RN, per Dr. Lindi Adie.

## 2017-04-27 NOTE — Patient Instructions (Signed)
[facility] Discharge Instructions for Patients Receiving Chemotherapy  Today you received the following biotherapy agents: Herceptin and Perjeta If you develop nausea and vomiting that is not controlled by your nausea medication, call the clinic.   BELOW ARE SYMPTOMS THAT SHOULD BE REPORTED IMMEDIATELY:  *FEVER GREATER THAN 100.5 F  *CHILLS WITH OR WITHOUT FEVER  NAUSEA AND VOMITING THAT IS NOT CONTROLLED WITH YOUR NAUSEA MEDICATION  *UNUSUAL SHORTNESS OF BREATH  *UNUSUAL BRUISING OR BLEEDING  TENDERNESS IN MOUTH AND THROAT WITH OR WITHOUT PRESENCE OF ULCERS  *URINARY PROBLEMS  *BOWEL PROBLEMS  UNUSUAL RASH Items with * indicate a potential emergency and should be followed up as soon as possible.  Feel free to call the clinic should you have any questions or concerns. The clinic phone number is [phone].  Please show the [facility] at check-in to the Emergency Department and triage nurse.

## 2017-04-27 NOTE — Progress Notes (Signed)
Per Dr.Gudena, okay to treat pt with current echo from June 2018. Will schedule pt for echo in the next 1-2 weeks.

## 2017-04-27 NOTE — Telephone Encounter (Signed)
Scheduled appt per 9/21 los - left message with appt date and time.  

## 2017-05-01 ENCOUNTER — Encounter: Payer: Self-pay | Admitting: Pediatrics

## 2017-05-01 ENCOUNTER — Ambulatory Visit (INDEPENDENT_AMBULATORY_CARE_PROVIDER_SITE_OTHER): Payer: BLUE CROSS/BLUE SHIELD | Admitting: Pediatrics

## 2017-05-01 VITALS — BP 139/86 | HR 77 | Temp 97.7°F | Ht 67.0 in | Wt 165.0 lb

## 2017-05-01 DIAGNOSIS — Z1159 Encounter for screening for other viral diseases: Secondary | ICD-10-CM

## 2017-05-01 DIAGNOSIS — Z720 Tobacco use: Secondary | ICD-10-CM | POA: Diagnosis not present

## 2017-05-01 DIAGNOSIS — C50411 Malignant neoplasm of upper-outer quadrant of right female breast: Secondary | ICD-10-CM

## 2017-05-01 DIAGNOSIS — Z170 Estrogen receptor positive status [ER+]: Secondary | ICD-10-CM | POA: Diagnosis not present

## 2017-05-01 DIAGNOSIS — E039 Hypothyroidism, unspecified: Secondary | ICD-10-CM

## 2017-05-01 NOTE — Progress Notes (Signed)
  Subjective:   Patient ID: Cynthia Chavez, female    DOB: 1955-10-28, 61 y.o.   MRN: [id number] CC: Thyroid Problem and Insect Bite  HPI: Cynthia Chavez is a 61 y.o. female presenting for Thyroid Problem and Insect Bite  Getting chemotherapy for breast ca Has 4 more treatments Finished with radiation  Mood has been good Support from family  hasnt had thyroid level checked in a while Taking med daily  Interested in quitting smoking at some point, not ready Trying to decrease   Relevant past medical, surgical, family and social history reviewed. Allergies and medications reviewed and updated. History  Smoking Status  . Current Every Day Smoker  . Packs/day: 0.50  . Years: 25.00  . Types: Cigarettes  . Start date: 04/19/1993  Smokeless Tobacco  . Never Used    Comment: she stopped smoking for a long time, but started back.    ROS: Per HPI   Objective:    BP 139/86   Pulse 77   Temp 97.7 F (36.5 C) (Oral)   Ht 5\' 7"  (1.702 m)   Wt 165 lb (74.8 kg)   BMI 25.84 kg/m   Wt Readings from Last 3 Encounters:  05/01/17 165 lb (74.8 kg)  04/27/17 163 lb 1.6 oz (74 kg)  04/06/17 162 lb (73.5 kg)    Gen: NAD, alert, cooperative with exam, NCAT EYES: EOMI, no conjunctival injection, or no icterus ENT:  TMs pearly gray b/l, OP without erythema LYMPH: no cervical LAD CV: NRRR, normal S1/S2 Resp: CTABL, no wheezes, normal WOB Abd: +BS, soft, NTND. no guarding or organomegaly Ext: No edema, warm Neuro: Alert and oriented  Assessment & Plan:  Cynthia Chavez was seen today for thyroid problem and insect bite.  Diagnoses and all orders for this visit:  Hypothyroidism, unspecified type TSH elevated, increase synthroid from 125 to 137 mcg -     TSH  Tobacco use Cont to encourage cessation, contemplative  Malignant neoplasm of upper-outer quadrant of right breast in female, estrogen receptor positive ([place]) Following with oncology  Need for hepatitis C screening test -      Hepatitis C antibody   Follow up plan: Return in about 6 months (around 10/29/2017). Assunta Found, MD [place]

## 2017-05-02 LAB — TSH: TSH: 5.25 u[IU]/mL — AB (ref 0.450–4.500)

## 2017-05-02 LAB — HEPATITIS C ANTIBODY: Hep C Virus Ab: <0.1 s/co ratio (ref 0.0–0.9)

## 2017-05-02 MED ORDER — LEVOTHYROXINE SODIUM 137 MCG PO TABS: 137.0000 ug | ORAL_TABLET | Freq: Every day | ORAL | 3 refills | Status: DC

## 2017-05-04 ENCOUNTER — Ambulatory Visit
Admission: RE | Admit: 2017-05-04 | Payer: BLUE CROSS/BLUE SHIELD | Source: Ambulatory Visit | Admitting: Radiation Oncology

## 2017-05-04 HISTORY — DX: Personal history of irradiation: Z92.3

## 2017-05-05 MED ORDER — LEVOTHYROXINE SODIUM 150 MCG PO TABS: 137.0000 ug | ORAL_TABLET | Freq: Every day | ORAL | 1 refills | Status: DC

## 2017-05-05 NOTE — Addendum Note (Signed)
Addended by: [name] on: 05/05/2017 09:24 AM   Modules accepted: Orders

## 2017-05-08 ENCOUNTER — Telehealth: Payer: Self-pay | Admitting: Pediatrics

## 2017-05-08 NOTE — Telephone Encounter (Signed)
Spoke with patient and documented in lab result note

## 2017-05-08 NOTE — Telephone Encounter (Signed)
Patient states she is returning call, but not sure why? Please advise. She said it might be on her labs.Marland Kitchen

## 2017-05-10 ENCOUNTER — Other Ambulatory Visit: Payer: Self-pay

## 2017-05-10 DIAGNOSIS — Z5181 Encounter for therapeutic drug level monitoring: Secondary | ICD-10-CM

## 2017-05-10 DIAGNOSIS — Z79899 Other long term (current) drug therapy: Secondary | ICD-10-CM

## 2017-05-10 DIAGNOSIS — Z170 Estrogen receptor positive status [ER+]: Principal | ICD-10-CM

## 2017-05-10 DIAGNOSIS — C50411 Malignant neoplasm of upper-outer quadrant of right female breast: Secondary | ICD-10-CM

## 2017-05-11 ENCOUNTER — Encounter: Payer: Self-pay | Admitting: Radiation Oncology

## 2017-05-11 ENCOUNTER — Ambulatory Visit
Admission: RE | Admit: 2017-05-11 | Discharge: 2017-05-11 | Disposition: A | Discharge status: Home / Self Care | Payer: BLUE CROSS/BLUE SHIELD | Source: Ambulatory Visit | Attending: Radiation Oncology | Admitting: Radiation Oncology

## 2017-05-11 DIAGNOSIS — C50411 Malignant neoplasm of upper-outer quadrant of right female breast: Secondary | ICD-10-CM

## 2017-05-11 DIAGNOSIS — Z923 Personal history of irradiation: Secondary | ICD-10-CM | POA: Diagnosis not present

## 2017-05-11 DIAGNOSIS — Z170 Estrogen receptor positive status [ER+]: Secondary | ICD-10-CM | POA: Diagnosis not present

## 2017-05-11 NOTE — Progress Notes (Signed)
Radiation Oncology         [phone] ________________________________  Name: Cynthia Chavez MRN: [id number]  Date: 05/11/2017  DOB: 04-04-1956  Follow-Up Visit Note  Outpatient  CC: [name], MD  Nicholas Lose, MD  Diagnosis and Prior Radiotherapy:    ICD-10-CM   1. Malignant neoplasm of upper-outer quadrant of right breast in female, estrogen receptor positive ([place]) C50.411    Z17.0     Stage IIIB ypT1c ypN3 cM0 Right Breast UIQ Invasive Ductal Carcinoma, ER Positive / PR Positive / Her2 Positive, Grade 3  CHIEF COMPLAINT: Here for follow-up and surveillance of right breast cancer  Narrative:  The patient returns today for routine follow-up following completion of radiotherapy, that began on 02/12/2017 and ended on 03/26/2017 where the patient received the following treatment: 1. Chest wall and regional nodes, right- 50 Gy in 25 fractions 2. Chest wall, boost, right -10 Gy in 5 fractions.  Pt presents to the office today and reports that she is doing well overall. She notes that she has completed some Physical Therapy and voiced concern for if she is able to receive another referral for Physical Therapy.   On review of systems, pt reports an area of concern that resembles a mole to her right chest wall. Pt reports right arm soreness.    ALLERGIES:  is allergic to ivp dye [iodinated diagnostic agents]; other; dilaudid [hydromorphone hcl]; asa [aspirin]; and codeine.  Meds: Current Outpatient Prescriptions  Medication Sig Dispense Refill  . anastrozole (ARIMIDEX) 1 MG tablet Take 1 tablet (1 mg total) by mouth daily. 90 tablet 3  . levothyroxine (SYNTHROID, LEVOTHROID) 150 MCG tablet Take 1 tablet (150 mcg total) by mouth daily before breakfast. 90 tablet 1  . lidocaine-prilocaine (EMLA) cream Apply to affected area once (Patient taking differently: Apply 1 application topically as needed (before treatments). Apply to affected area once) 30 g 3   No current  facility-administered medications for this encounter.     Physical Findings: The patient is in no acute distress. Patient is alert and oriented.  height is '5\' 7"'  (1.702 m) and weight is 164 lb 9.6 oz (74.7 kg). Her temperature is 98.2 F (36.8 C). Her blood pressure is 110/78 and her pulse is 86. Her oxygen saturation is 100%. .  Chest Wall: There is what appears to be a tiny mole that has bled at the midline of the chest and is a couple mm in dimension. Faint residual dryness and hyperpigmentation along the right chest wall scar. Moderate diffuse edema along the lower right chest wall.    Lab Findings: Lab Results  Component Value Date   WBC 4.6 04/27/2017   HGB 13.9 04/27/2017   HCT 40.9 04/27/2017   MCV 99.7 04/27/2017   PLT 166 04/27/2017    Radiographic Findings: No results found.  Impression/Plan:  This is a 61 y.o. lovely woman with Stage IIIB ypT1c ypN3 cM0 Right Breast UIQ Invasive Ductal Carcinoma, ER Positive / PR Positive / Her2 Positive, Grade 3.  I advised the pt use vitamin E oil or lotions with vitamin E oil to aid in alleviating radiation related skin changes to right chest wall. I encouraged her to continue with yearly left breast mammography and followup with medical oncology. Consider PT referral if swelling/inflammation in lower chest wall doesn't improve in the next month - she will let me know.   I will see her back on an as-needed basis. I have encouraged her to call if she  has any issues or concerns in the future. I wished her the very best. .   _____________________________________   Eppie Gibson, MD   This document serves as a record of services personally performed by Eppie Gibson, MD. It was created on her behalf by Steva Colder, a trained medical scribe. The creation of this record is based on the scribe's personal observations and the provider's statements to them. This document has been checked and approved by the attending provider.

## 2017-05-11 NOTE — Progress Notes (Signed)
Cynthia Chavez presents for follow up of radiation completed 03/26/17 to her Right Chest Wall. She reports pain in her bilateral feet and hands related to neuropathy. She is taking anastrozole daily. She continues to receive Herceptin and Perjeta until December. She will see Dr. Lindi Adie 06/08/17. Her right chest has hyperpigmentation to her incision area. She has a reddened scab area to her mid chest area. She continues to use Radiaplex about every other day. She was encouraged to use it daily and switch to a vitamin E cream/oil when the tube is completed.   BP 110/78   Pulse 86   Temp 98.2 F (36.8 C)   Ht 5\' 7"  (1.702 m)   Wt 164 lb 9.6 oz (74.7 kg)   SpO2 100% Comment: room air  BMI 25.78 kg/m    Wt Readings from Last 3 Encounters:  05/11/17 164 lb 9.6 oz (74.7 kg)  05/01/17 165 lb (74.8 kg)  04/27/17 163 lb 1.6 oz (74 kg)

## 2017-05-15 ENCOUNTER — Other Ambulatory Visit (HOSPITAL_COMMUNITY): Payer: BLUE CROSS/BLUE SHIELD

## 2017-05-17 ENCOUNTER — Ambulatory Visit (HOSPITAL_COMMUNITY)
Admission: RE | Admit: 2017-05-17 | Discharge: 2017-05-17 | Disposition: A | Discharge status: Home / Self Care | Payer: BLUE CROSS/BLUE SHIELD | Source: Ambulatory Visit | Attending: Hematology and Oncology | Admitting: Hematology and Oncology

## 2017-05-17 DIAGNOSIS — Z720 Tobacco use: Secondary | ICD-10-CM | POA: Diagnosis not present

## 2017-05-17 DIAGNOSIS — Z79899 Other long term (current) drug therapy: Secondary | ICD-10-CM | POA: Insufficient documentation

## 2017-05-17 DIAGNOSIS — Z9221 Personal history of antineoplastic chemotherapy: Secondary | ICD-10-CM | POA: Diagnosis not present

## 2017-05-17 DIAGNOSIS — I5030 Unspecified diastolic (congestive) heart failure: Secondary | ICD-10-CM | POA: Diagnosis not present

## 2017-05-17 DIAGNOSIS — Z5181 Encounter for therapeutic drug level monitoring: Secondary | ICD-10-CM

## 2017-05-17 DIAGNOSIS — Z853 Personal history of malignant neoplasm of breast: Secondary | ICD-10-CM | POA: Diagnosis not present

## 2017-05-17 LAB — ECHOCARDIOGRAM COMPLETE
AVLVOTPG: 3 mmHg
CHL CUP DOP CALC LVOT VTI: 18.4 cm
E decel time: 176 msec
EERAT: 13.89
FS: 33 % (ref 28–44)
IV/PV OW: 1.58
LA ID, A-P, ES: 40 mm
LADIAMINDEX: 2.15 cm/m2
LAVOL: 33.5 mL
LAVOLA4C: 40.8 mL
LAVOLIN: 18 mL/m2
LDCA: 3.46 cm2
LEFT ATRIUM END SYS DIAM: 40 mm
LV E/e' medial: 13.89
LV E/e'average: 13.89
LV PW d: 8.37 mm — AB (ref 0.6–1.1)
LV TDI E'MEDIAL: 6.96
LVELAT: 6.74 cm/s
LVOT SV: 64 mL
LVOT peak vel: 88.7 cm/s
LVOTD: 21 mm
Lateral S' vel: 13.3 cm/s
MV Dec: 176
MV Peak grad: 4 mmHg
MVPKAVEL: 96.5 m/s
MVPKEVEL: 93.6 m/s
TAPSE: 25.2 mm
TDI e' lateral: 6.74

## 2017-05-17 NOTE — Progress Notes (Signed)
  Echocardiogram 2D Echocardiogram has been performed.  Cynthia Chavez 05/17/2017, 3:34 PM

## 2017-05-18 ENCOUNTER — Ambulatory Visit (HOSPITAL_BASED_OUTPATIENT_CLINIC_OR_DEPARTMENT_OTHER): Payer: BLUE CROSS/BLUE SHIELD

## 2017-05-18 VITALS — BP 118/62 | HR 68 | Temp 98.4°F | Resp 16

## 2017-05-18 DIAGNOSIS — Z5112 Encounter for antineoplastic immunotherapy: Secondary | ICD-10-CM | POA: Diagnosis not present

## 2017-05-18 DIAGNOSIS — Z170 Estrogen receptor positive status [ER+]: Principal | ICD-10-CM

## 2017-05-18 DIAGNOSIS — C50411 Malignant neoplasm of upper-outer quadrant of right female breast: Secondary | ICD-10-CM

## 2017-05-18 MED ORDER — SODIUM CHLORIDE 0.9 % IV SOLN
420.0000 mg | Freq: Once | INTRAVENOUS | Status: AC
  Administered 2017-05-18: 420 mg via INTRAVENOUS
  Filled 2017-05-18: qty 14

## 2017-05-18 MED ORDER — SODIUM CHLORIDE 0.9 % IV SOLN
Freq: Once | INTRAVENOUS | Status: AC
  Administered 2017-05-18: 11:00:00 via INTRAVENOUS

## 2017-05-18 MED ORDER — HEPARIN SOD (PORK) LOCK FLUSH 100 UNIT/ML IV SOLN
500.0000 [IU] | Freq: Once | INTRAVENOUS | Status: AC | PRN
  Administered 2017-05-18: 500 [IU]
  Filled 2017-05-18: qty 5

## 2017-05-18 MED ORDER — ACETAMINOPHEN 325 MG PO TABS
650.0000 mg | ORAL_TABLET | Freq: Once | ORAL | Status: AC
  Administered 2017-05-18: 650 mg via ORAL

## 2017-05-18 MED ORDER — ACETAMINOPHEN 325 MG PO TABS
ORAL_TABLET | ORAL | Status: AC
  Filled 2017-05-18: qty 2

## 2017-05-18 MED ORDER — DIPHENHYDRAMINE HCL 25 MG PO CAPS
50.0000 mg | ORAL_CAPSULE | Freq: Once | ORAL | Status: AC
  Administered 2017-05-18: 25 mg via ORAL

## 2017-05-18 MED ORDER — DIPHENHYDRAMINE HCL 25 MG PO CAPS
ORAL_CAPSULE | ORAL | Status: AC
  Filled 2017-05-18: qty 1

## 2017-05-18 MED ORDER — SODIUM CHLORIDE 0.9% FLUSH
10.0000 mL | INTRAVENOUS | Status: DC | PRN
  Administered 2017-05-18: 10 mL
  Filled 2017-05-18: qty 10

## 2017-05-18 MED ORDER — TRASTUZUMAB CHEMO 150 MG IV SOLR
450.0000 mg | Freq: Once | INTRAVENOUS | Status: AC
  Administered 2017-05-18: 450 mg via INTRAVENOUS
  Filled 2017-05-18: qty 21.43

## 2017-05-25 ENCOUNTER — Ambulatory Visit: Payer: BLUE CROSS/BLUE SHIELD

## 2017-06-08 ENCOUNTER — Other Ambulatory Visit (HOSPITAL_BASED_OUTPATIENT_CLINIC_OR_DEPARTMENT_OTHER): Payer: BLUE CROSS/BLUE SHIELD

## 2017-06-08 ENCOUNTER — Ambulatory Visit (HOSPITAL_BASED_OUTPATIENT_CLINIC_OR_DEPARTMENT_OTHER): Payer: BLUE CROSS/BLUE SHIELD | Admitting: Oncology

## 2017-06-08 ENCOUNTER — Encounter: Payer: Self-pay | Admitting: Oncology

## 2017-06-08 ENCOUNTER — Ambulatory Visit (HOSPITAL_BASED_OUTPATIENT_CLINIC_OR_DEPARTMENT_OTHER): Payer: BLUE CROSS/BLUE SHIELD

## 2017-06-08 VITALS — BP 129/72 | HR 71 | Temp 98.4°F | Resp 18 | Ht 67.0 in | Wt 167.0 lb

## 2017-06-08 DIAGNOSIS — C50411 Malignant neoplasm of upper-outer quadrant of right female breast: Secondary | ICD-10-CM

## 2017-06-08 DIAGNOSIS — Z170 Estrogen receptor positive status [ER+]: Secondary | ICD-10-CM | POA: Diagnosis not present

## 2017-06-08 DIAGNOSIS — Z5112 Encounter for antineoplastic immunotherapy: Secondary | ICD-10-CM | POA: Diagnosis not present

## 2017-06-08 LAB — COMPREHENSIVE METABOLIC PANEL
ALT: 13 U/L (ref 0–55)
AST: 13 U/L (ref 5–34)
Albumin: 3.5 g/dL (ref 3.5–5.0)
Alkaline Phosphatase: 85 U/L (ref 40–150)
Anion Gap: 9 mEq/L (ref 3–11)
BUN: 11.6 mg/dL (ref 7.0–26.0)
CHLORIDE: 107 meq/L (ref 98–109)
CO2: 26 meq/L (ref 22–29)
Calcium: 9 mg/dL (ref 8.4–10.4)
Creatinine: 0.7 mg/dL (ref 0.6–1.1)
EGFR: >60 mL/min/{1.73_m2} (ref >60)
GLUCOSE: 70 mg/dL (ref 70–140)
POTASSIUM: 3.9 meq/L (ref 3.5–5.1)
Sodium: 142 mEq/L (ref 136–145)
TOTAL PROTEIN: 6.4 g/dL (ref 6.4–8.3)
Total Bilirubin: 0.51 mg/dL (ref 0.20–1.20)

## 2017-06-08 LAB — CBC WITH DIFFERENTIAL/PLATELET
BASO%: 0.2 % (ref 0.0–2.0)
BASOS ABS: 0 10*3/uL (ref 0.0–0.1)
EOS%: 6.1 % (ref 0.0–7.0)
Eosinophils Absolute: 0.4 10*3/uL (ref 0.0–0.5)
HEMATOCRIT: 39.1 % (ref 34.8–46.6)
HGB: 12.7 g/dL (ref 11.6–15.9)
LYMPH#: 0.8 10*3/uL — AB (ref 0.9–3.3)
LYMPH%: 14.7 % (ref 14.0–49.7)
MCH: 32.6 pg (ref 25.1–34.0)
MCHC: 32.5 g/dL (ref 31.5–36.0)
MCV: 100.3 fL (ref 79.5–101.0)
MONO#: 0.4 10*3/uL (ref 0.1–0.9)
MONO%: 7.5 % (ref 0.0–14.0)
NEUT#: 4.1 10*3/uL (ref 1.5–6.5)
NEUT%: 71.5 % (ref 38.4–76.8)
PLATELETS: 153 10*3/uL (ref 145–400)
RBC: 3.9 10*6/uL (ref 3.70–5.45)
RDW: 12.9 % (ref 11.2–14.5)
WBC: 5.7 10*3/uL (ref 3.9–10.3)

## 2017-06-08 MED ORDER — SODIUM CHLORIDE 0.9 % IV SOLN
450.0000 mg | Freq: Once | INTRAVENOUS | Status: AC
  Administered 2017-06-08: 450 mg via INTRAVENOUS
  Filled 2017-06-08: qty 21.43

## 2017-06-08 MED ORDER — ACETAMINOPHEN 325 MG PO TABS
650.0000 mg | ORAL_TABLET | Freq: Once | ORAL | Status: AC
  Administered 2017-06-08: 650 mg via ORAL

## 2017-06-08 MED ORDER — DIPHENHYDRAMINE HCL 25 MG PO CAPS
50.0000 mg | ORAL_CAPSULE | Freq: Once | ORAL | Status: AC
  Administered 2017-06-08: 25 mg via ORAL

## 2017-06-08 MED ORDER — HEPARIN SOD (PORK) LOCK FLUSH 100 UNIT/ML IV SOLN
500.0000 [IU] | Freq: Once | INTRAVENOUS | Status: AC | PRN
  Administered 2017-06-08: 500 [IU]
  Filled 2017-06-08: qty 5

## 2017-06-08 MED ORDER — PERTUZUMAB CHEMO INJECTION 420 MG/14ML
420.0000 mg | Freq: Once | INTRAVENOUS | Status: AC
  Administered 2017-06-08: 420 mg via INTRAVENOUS
  Filled 2017-06-08: qty 14

## 2017-06-08 MED ORDER — DIPHENHYDRAMINE HCL 25 MG PO CAPS
ORAL_CAPSULE | ORAL | Status: AC
  Filled 2017-06-08: qty 1

## 2017-06-08 MED ORDER — GABAPENTIN 100 MG PO CAPS: 100.0000 mg | ORAL_CAPSULE | Freq: Two times a day (BID) | ORAL | 1 refills | Status: DC

## 2017-06-08 MED ORDER — SODIUM CHLORIDE 0.9% FLUSH
10.0000 mL | INTRAVENOUS | Status: DC | PRN
  Administered 2017-06-08: 10 mL
  Filled 2017-06-08: qty 10

## 2017-06-08 MED ORDER — SODIUM CHLORIDE 0.9 % IV SOLN
Freq: Once | INTRAVENOUS | Status: AC
  Administered 2017-06-08: 10:00:00 via INTRAVENOUS

## 2017-06-08 MED ORDER — ACETAMINOPHEN 325 MG PO TABS
ORAL_TABLET | ORAL | Status: AC
  Filled 2017-06-08: qty 2

## 2017-06-08 NOTE — Assessment & Plan Note (Signed)
Rt Biopsy 11oclock and 2 o clock: Grade 3 IDC with LVI; LN biopsy: Positive IDC; 11oclock: Er 100%, PR 90%, Ki 67: 90%; Her 2 Neg Heterogeneous; LN: Er 100%, PR: 90%; Ki67: 60%; Her 2 Pos Ratio 2.37; 2oclock: Er 100%, PR 90%; Ki 67: 90%; Her 2 Positive 2.52 Breast MRI on 08/13/2016: 8 cm span of breast cancer with multiple enlarged axillary lymph nodes. CT CAP: 08/17/16: 4 mm right middle lobe pulmonary nodule unlikely to be metastatic disease Bone scan 08/17/2016: No evidence of metastatic disease  Echocardiogram performed on 05/17/2017 showed a left ventricular ejection fraction of 60-65%.  Recommendationbased on multidisciplinary tumor board: 1. Neoadjuvant chemotherapy with TCH Perjeta 6 cycles given 08/16/16 to 12/01/16 followed by Herceptin Perjetamaintenance for 1 year  2. Followed by mastectomy with axillary node dissection 01/03/2017 3. Adjuvant radiation therapycompleted 03/26/2017 4. Followed by Anti-estrogen therapy started 04/06/2017 5. Followed by Neratinib for 1 year -------------------------------------------------------------------------------------------------------------------------------------------------------------------------------------------------------------------------------------- 01/04/2007 Right mastectomy: Multiple IDC with micropapillary features, LVI, grade 3, 1.5, 0.6, 0.5 cm, 12/20 lymph nodes positive for cancer, margins negative, ER 100%, PR 90%, HER-2 positive, Ki-67 60%,ypT1c ypN3 RCB class III; stage IIIB  Adjuvant radiation therapy started 07/09/2018completed 03/26/2017  Current treatment: Herceptin Perjeta maintenance to be completed in December 2018 Anastrozole started 04/06/17  The patient is tolerating her treatment with Perjeta and Herceptin well. She is tolerating the anastrozole well. She will continue her current treatment. For her neuropathic pain, we will try her on low-dose gabapentin. She will start with 100 mg at bedtime for 1 week and if  she tolerates that she was instructed to increase this to 1 capsule twice a day.  Return to clinic every 3 weeks for Herceptin and Perjeta every 6 weeks for follow-up with me.   The patient has a good understanding of the overall plan. she agrees with it. she will call with any problems that may develop before the next visit here.

## 2017-06-08 NOTE — Patient Instructions (Signed)
[facility] Discharge Instructions for Patients Receiving Chemotherapy  Today you received the following biotherapy agents: Herceptin and Perjeta If you develop nausea and vomiting that is not controlled by your nausea medication, call the clinic.   BELOW ARE SYMPTOMS THAT SHOULD BE REPORTED IMMEDIATELY:  *FEVER GREATER THAN 100.5 F  *CHILLS WITH OR WITHOUT FEVER  NAUSEA AND VOMITING THAT IS NOT CONTROLLED WITH YOUR NAUSEA MEDICATION  *UNUSUAL SHORTNESS OF BREATH  *UNUSUAL BRUISING OR BLEEDING  TENDERNESS IN MOUTH AND THROAT WITH OR WITHOUT PRESENCE OF ULCERS  *URINARY PROBLEMS  *BOWEL PROBLEMS  UNUSUAL RASH Items with * indicate a potential emergency and should be followed up as soon as possible.  Feel free to call the clinic should you have any questions or concerns. The clinic phone number is [phone].  Please show the [facility] at check-in to the Emergency Department and triage nurse.

## 2017-06-08 NOTE — Patient Instructions (Signed)
Begin Gabapentin 100 mg at bedtime for 1 week. If you do well with this, increase this to one capsule twice a day. If you find that it is too sedating, you can continue the 100 mg at bedtime.

## 2017-06-08 NOTE — Progress Notes (Signed)
[facility] OFFICE PROGRESS NOTE  [name], [place] [id number]  DIAGNOSIS: Malignant neoplasm of upper-outer quadrant of right breast in female, estrogen receptor positive ([place])  SUMMARY OF ONCOLOGIC HISTORY:       Breast cancer of upper-outer quadrant of right female breast ([place])   07/24/2016 Mammogram    Rt Breast 2 masses at 11-12:30 Position spanning 8 cm; Additional mass at 2 o clock: 1.5 cm; LN with cort thickening 2.7 cm      07/27/2016 Initial Diagnosis    Rt Biopsy 11oclock and 2 o clock: Grade 3 IDC with LVI; LN biopsy: Positive IDC; 11oclock: Er 100%, PR 90%, Ki 67: 90%; Her 2 Neg Heterogeneous; LN: Er 100%, PR: 90%; Ki67: 60%; Her 2 Pos Ratio 2.37; 2oclock: Er 100%, PR 90%; Ki 67: 90%; Her 2 Positive 2.52      08/16/2016 - 12/01/2016 Neo-Adjuvant Chemotherapy    Neoadjuvant chemotherapy with TCH Perjeta (Taxotere d/ced after 2 cycles)      12/06/2016 Breast MRI    Residual right breast 2 o'clock enhancing mass which now measures 1.3 cm in greatest dimension.Residual non measurable scattered foci of heterogeneous enhancement throughout the superior and subareolar right breast, anterior and middle depth. Diminished right axillary lymphadenopathy      01/03/2017 Surgery    Right mastectomy: Multiple IDC with micropapillary features, LVI, grade 3, 1.5, 0.6, 0.5 cm, 12/20 lymph nodes positive for cancer, margins negative, ER 100%, PR 90%, HER-2 positive, Ki-67 60%,ypT1c ypN3 RCB class III; stage IIIB      02/12/2017 - 03/26/2017 Radiation Therapy    Adjuvant radiation therapy      04/06/2017 -  Anti-estrogen oral therapy    Anastrozole 1 mg by mouth daily       CURRENT THERAPY: Anastrozole 1 mg daily started on 03/19/2017 along with maintenance Herceptin and Perjeta.  INTERVAL HISTORY: DANIELYS MADRY 61 y.o. female returns for routine follow-up visit by herself. The patient continues to tolerate her  treatment well. Her only complaint today is of nerve pain at her prior surgery site. It sometimes wakes her up at night. She denies fevers and chills. Denies chest pain, shortness breath, cough, hemoptysis. Denies nausea, vomiting, constipation, diarrhea. She does not complain of any hot flashes or myalgias. The patient is here for evaluation prior to her next cycle of Herceptin and Perjeta.  MEDICAL HISTORY: Past Medical History:  Diagnosis Date  . Anxiety   . Cancer [facility])    breast cancer  . Depression   . Edema    bilateral feet and leg swelling  . GERD (gastroesophageal reflux disease)   . History of hiatal hernia   . History of radiation therapy 02/12/17- 03/26/17   Chest wall and regional nodes, right- 50 Gy in 25 fractions, Chest Wall, boost, right- 10 Gy in 5 fractions.   . Hypothyroidism   . Malaise   . Thyroid disease   . Varicose veins     ALLERGIES:  is allergic to ivp dye [iodinated diagnostic agents]; other; dilaudid [hydromorphone hcl]; asa [aspirin]; and codeine.  MEDICATIONS:  Current Outpatient Prescriptions  Medication Sig Dispense Refill  . anastrozole (ARIMIDEX) 1 MG tablet Take 1 tablet (1 mg total) by mouth daily. 90 tablet 3  . levothyroxine (SYNTHROID, LEVOTHROID) 150 MCG tablet Take 1 tablet (150 mcg total) by mouth daily before breakfast. 90 tablet 1  . lidocaine-prilocaine (EMLA) cream Apply to affected area once (Patient taking differently: Apply 1 application  topically as needed (before treatments). Apply to affected area once) 30 g 3  . gabapentin (NEURONTIN) 100 MG capsule Take 1 capsule (100 mg total) by mouth 2 (two) times daily. 60 capsule 1   No current facility-administered medications for this visit.    Facility-Administered Medications Ordered in Other Visits  Medication Dose Route Frequency Provider Last Rate Last Dose  . heparin lock flush 100 unit/mL  500 Units Intracatheter Once PRN Nicholas Lose, MD      . pertuzumab (PERJETA) 420 mg in  sodium chloride 0.9 % 250 mL chemo infusion  420 mg Intravenous Once Nicholas Lose, MD      . sodium chloride flush (NS) 0.9 % injection 10 mL  10 mL Intracatheter PRN Nicholas Lose, MD      . trastuzumab (HERCEPTIN) 450 mg in sodium chloride 0.9 % 250 mL chemo infusion  450 mg Intravenous Once Nicholas Lose, MD 542.9 mL/hr at 06/08/17 1124 450 mg at 06/08/17 1124    SURGICAL HISTORY:  Past Surgical History:  Procedure Laterality Date  . ABDOMINAL HYSTERECTOMY    . AXILLARY LYMPH NODE BIOPSY Right 01/03/2017   Procedure: RIGHT AXILLARY LYMPH NODE SEED GUIDED EXCISIONAL BIOPSY;  Surgeon: Rolm Bookbinder, MD;  Location: [facility];  Service: General;  Laterality: Right;  . CHOLECYSTECTOMY    . COLONOSCOPY    . ESOPHAGEAL DILATION    . MASTECTOMY W/ SENTINEL NODE BIOPSY Right 01/03/2017   axillary  . MASTECTOMY W/ SENTINEL NODE BIOPSY Right 01/03/2017   Procedure: RIGHT TOTAL MASTECTOMY WITH RIGHT AXILLARY SENTINEL LYMPH NODE BIOPSY;  Surgeon: Rolm Bookbinder, MD;  Location: [facility];  Service: General;  Laterality: Right;  . PORTACATH PLACEMENT Left 08/17/2016   Procedure: INSERTION PORT-A-CATH WITH [place];  Surgeon: Rolm Bookbinder, MD;  Location: [facility];  Service: General;  Laterality: Left;    REVIEW OF SYSTEMS:   Review of Systems  Constitutional: Negative for appetite change, chills, fatigue, fever and unexpected weight change.  HENT:   Negative for mouth sores, nosebleeds, sore throat and trouble swallowing.   Eyes: Negative for eye problems and icterus.  Respiratory: Negative for cough, hemoptysis, shortness of breath and wheezing.   Cardiovascular: Negative for chest pain and leg swelling.  Gastrointestinal: Negative for abdominal pain, constipation, diarrhea, nausea and vomiting.  Genitourinary: Negative for bladder incontinence, difficulty urinating, dysuria, frequency and hematuria.   Musculoskeletal: Negative for back pain, gait problem, neck pain and neck  stiffness.  Skin: Negative for itching and rash.  Neurological: Negative for dizziness, extremity weakness, gait problem, headaches, light-headedness and seizures.  positive for nerve pain at the prior right mastectomy site. Hematological: Negative for adenopathy. Does not bruise/bleed easily.  Psychiatric/Behavioral: Negative for confusion, depression and sleep disturbance. The patient is not nervous/anxious.     PHYSICAL EXAMINATION:  Blood pressure 129/72, pulse 71, temperature 98.4 F (36.9 C), temperature source Oral, resp. rate 18, height _0  (1.702 m), weight 167 lb (75.8 kg), SpO2 99 %.  ECOG PERFORMANCE STATUS: 1 - Symptomatic but completely ambulatory  Physical Exam  Constitutional: Oriented to person, place, and time and well-developed, well-nourished, and in no distress. No distress.  HENT:  Head: Normocephalic and atraumatic.  Mouth/Throat: Oropharynx is clear and moist. No oropharyngeal exudate.  Eyes: Conjunctivae are normal. Right eye exhibits no discharge. Left eye exhibits no discharge. No scleral icterus.  Neck: Normal range of motion. Neck supple.  Cardiovascular: Normal rate, regular rhythm, normal heart sounds and intact distal pulses.   Pulmonary/Chest: Effort  normal and breath sounds normal. No respiratory distress. No wheezes. No rales.  Abdominal: Soft. Bowel sounds are normal. Exhibits no distension and no mass. There is no tenderness.  Musculoskeletal: Normal range of motion. Exhibits no edema.  Lymphadenopathy:    No cervical adenopathy.  Neurological: Alert and oriented to person, place, and time. Exhibits normal muscle tone. Gait normal. Coordination normal.  Skin: Skin is warm and dry. No rash noted. Not diaphoretic. No erythema. No pallor.  Psychiatric: Mood, memory and judgment normal.  Vitals reviewed.  LABORATORY DATA: Lab Results  Component Value Date   WBC 5.7 06/08/2017   HGB 12.7 06/08/2017   HCT 39.1 06/08/2017   MCV 100.3 06/08/2017    PLT 153 06/08/2017      Chemistry      Component Value Date/Time   NA 142 06/08/2017 0807   K 3.9 06/08/2017 0807   CL 107 08/14/2016 1605   CO2 26 06/08/2017 0807   BUN 11.6 06/08/2017 0807   CREATININE 0.7 06/08/2017 0807      Component Value Date/Time   CALCIUM 9.0 06/08/2017 0807   ALKPHOS 85 06/08/2017 0807   AST 13 06/08/2017 0807   ALT 13 06/08/2017 0807   BILITOT 0.51 06/08/2017 0807       RADIOGRAPHIC STUDIES:  No results found.   ASSESSMENT/PLAN:  Breast cancer of upper-outer quadrant of right female breast ([place]) Rt Biopsy 11oclock and 2 o clock: Grade 3 IDC with LVI; LN biopsy: Positive IDC; 11oclock: Er 100%, PR 90%, Ki 67: 90%; Her 2 Neg Heterogeneous; LN: Er 100%, PR: 90%; Ki67: 60%; Her 2 Pos Ratio 2.37; 2oclock: Er 100%, PR 90%; Ki 67: 90%; Her 2 Positive 2.52 Breast MRI on 08/13/2016: 8 cm span of breast cancer with multiple enlarged axillary lymph nodes. CT CAP: 08/17/16: 4 mm right middle lobe pulmonary nodule unlikely to be metastatic disease Bone scan 08/17/2016: No evidence of metastatic disease  Echocardiogram performed on 05/17/2017 showed a left ventricular ejection fraction of 60-65%.  Recommendationbased on multidisciplinary tumor board: 1. Neoadjuvant chemotherapy with TCH Perjeta 6 cycles given 08/16/16 to 12/01/16 followed by Herceptin Perjetamaintenance for 1 year  2. Followed by mastectomy with axillary node dissection 01/03/2017 3. Adjuvant radiation therapycompleted 03/26/2017 4. Followed by Anti-estrogen therapy started 04/06/2017 5. Followed by Neratinib for 1 year -------------------------------------------------------------------------------------------------------------------------------------------------------------------------------------------------------------------------------------- 01/04/2007 Right mastectomy: Multiple IDC with micropapillary features, LVI, grade 3, 1.5, 0.6, 0.5 cm, 12/20 lymph nodes positive for cancer,  margins negative, ER 100%, PR 90%, HER-2 positive, Ki-67 60%,ypT1c ypN3 RCB class III; stage IIIB  Adjuvant radiation therapy started 07/09/2018completed 03/26/2017  Current treatment: Herceptin Perjeta maintenance to be completed in December 2018 Anastrozole started 04/06/17  The patient is tolerating her treatment with Perjeta and Herceptin well. She is tolerating the anastrozole well. She will continue her current treatment. For her neuropathic pain, we will try her on low-dose gabapentin. She will start with 100 mg at bedtime for 1 week and if she tolerates that she was instructed to increase this to 1 capsule twice a day.  Return to clinic every 3 weeks for Herceptin and Perjeta every 6 weeks for follow-up with me.   The patient has a good understanding of the overall plan. she agrees with it. she will call with any problems that may develop before the next visit here.  No orders of the defined types were placed in this encounter.   Mikey Bussing, DNP, AGPCNP-BC, AOCNP 06/08/17

## 2017-06-10 ENCOUNTER — Telehealth: Payer: Self-pay | Admitting: Hematology and Oncology

## 2017-06-10 NOTE — Telephone Encounter (Signed)
Lvm advising appts 11/23 @ 10am and 12/14 @ 8.15am.

## 2017-06-15 ENCOUNTER — Telehealth: Payer: Self-pay | Admitting: Oncology

## 2017-06-15 NOTE — Telephone Encounter (Signed)
Per 11/2 los - all previous appts have already been added

## 2017-06-29 ENCOUNTER — Ambulatory Visit (HOSPITAL_BASED_OUTPATIENT_CLINIC_OR_DEPARTMENT_OTHER): Payer: BLUE CROSS/BLUE SHIELD

## 2017-06-29 ENCOUNTER — Other Ambulatory Visit (HOSPITAL_BASED_OUTPATIENT_CLINIC_OR_DEPARTMENT_OTHER): Payer: BLUE CROSS/BLUE SHIELD

## 2017-06-29 VITALS — BP 116/61 | HR 74 | Temp 97.8°F | Resp 18

## 2017-06-29 DIAGNOSIS — Z170 Estrogen receptor positive status [ER+]: Principal | ICD-10-CM

## 2017-06-29 DIAGNOSIS — C50411 Malignant neoplasm of upper-outer quadrant of right female breast: Secondary | ICD-10-CM

## 2017-06-29 DIAGNOSIS — Z5112 Encounter for antineoplastic immunotherapy: Secondary | ICD-10-CM | POA: Diagnosis not present

## 2017-06-29 LAB — COMPREHENSIVE METABOLIC PANEL
ALBUMIN: 3.6 g/dL (ref 3.5–5.0)
ALK PHOS: 85 U/L (ref 40–150)
ALT: 13 U/L (ref 0–55)
AST: 14 U/L (ref 5–34)
Anion Gap: 9 mEq/L (ref 3–11)
BUN: 15.2 mg/dL (ref 7.0–26.0)
CO2: 28 mEq/L (ref 22–29)
Calcium: 9.4 mg/dL (ref 8.4–10.4)
Chloride: 106 mEq/L (ref 98–109)
Creatinine: 0.7 mg/dL (ref 0.6–1.1)
GLUCOSE: 84 mg/dL (ref 70–140)
POTASSIUM: 3.9 meq/L (ref 3.5–5.1)
SODIUM: 142 meq/L (ref 136–145)
Total Bilirubin: 0.65 mg/dL (ref 0.20–1.20)
Total Protein: 6.8 g/dL (ref 6.4–8.3)

## 2017-06-29 LAB — CBC WITH DIFFERENTIAL/PLATELET
BASO%: 0.6 % (ref 0.0–2.0)
BASOS ABS: 0 10*3/uL (ref 0.0–0.1)
EOS ABS: 0.2 10*3/uL (ref 0.0–0.5)
EOS%: 4.7 % (ref 0.0–7.0)
HCT: 39.2 % (ref 34.8–46.6)
HEMOGLOBIN: 13.1 g/dL (ref 11.6–15.9)
LYMPH%: 18 % (ref 14.0–49.7)
MCH: 32.8 pg (ref 25.1–34.0)
MCHC: 33.4 g/dL (ref 31.5–36.0)
MCV: 98.2 fL (ref 79.5–101.0)
MONO#: 0.3 10*3/uL (ref 0.1–0.9)
MONO%: 6.6 % (ref 0.0–14.0)
NEUT#: 3.5 10*3/uL (ref 1.5–6.5)
NEUT%: 70.1 % (ref 38.4–76.8)
Platelets: 168 10*3/uL (ref 145–400)
RBC: 4 10*6/uL (ref 3.70–5.45)
RDW: 13.3 % (ref 11.2–14.5)
WBC: 5 10*3/uL (ref 3.9–10.3)
lymph#: 0.9 10*3/uL (ref 0.9–3.3)

## 2017-06-29 MED ORDER — DIPHENHYDRAMINE HCL 25 MG PO CAPS
50.0000 mg | ORAL_CAPSULE | Freq: Once | ORAL | Status: AC
  Administered 2017-06-29: 25 mg via ORAL

## 2017-06-29 MED ORDER — SODIUM CHLORIDE 0.9% FLUSH
10.0000 mL | INTRAVENOUS | Status: DC | PRN
  Administered 2017-06-29: 10 mL
  Filled 2017-06-29: qty 10

## 2017-06-29 MED ORDER — PERTUZUMAB CHEMO INJECTION 420 MG/14ML
420.0000 mg | Freq: Once | INTRAVENOUS | Status: AC
  Administered 2017-06-29: 420 mg via INTRAVENOUS
  Filled 2017-06-29: qty 14

## 2017-06-29 MED ORDER — SODIUM CHLORIDE 0.9 % IV SOLN
Freq: Once | INTRAVENOUS | Status: AC
  Administered 2017-06-29: 11:00:00 via INTRAVENOUS

## 2017-06-29 MED ORDER — TRASTUZUMAB CHEMO 150 MG IV SOLR
450.0000 mg | Freq: Once | INTRAVENOUS | Status: AC
  Administered 2017-06-29: 450 mg via INTRAVENOUS
  Filled 2017-06-29: qty 21.43

## 2017-06-29 MED ORDER — HEPARIN SOD (PORK) LOCK FLUSH 100 UNIT/ML IV SOLN
500.0000 [IU] | Freq: Once | INTRAVENOUS | Status: AC | PRN
  Administered 2017-06-29: 500 [IU]
  Filled 2017-06-29: qty 5

## 2017-06-29 MED ORDER — DIPHENHYDRAMINE HCL 25 MG PO CAPS
ORAL_CAPSULE | ORAL | Status: AC
Start: 2017-06-29 — End: 2017-06-29
  Filled 2017-06-29: qty 1

## 2017-06-29 MED ORDER — ACETAMINOPHEN 325 MG PO TABS
ORAL_TABLET | ORAL | Status: AC
Start: 2017-06-29 — End: 2017-06-29
  Filled 2017-06-29: qty 2

## 2017-06-29 MED ORDER — ACETAMINOPHEN 325 MG PO TABS
650.0000 mg | ORAL_TABLET | Freq: Once | ORAL | Status: AC
  Administered 2017-06-29: 650 mg via ORAL

## 2017-06-29 NOTE — Addendum Note (Signed)
Addended by: Ena Dawley on: 06/29/2017 09:27 AM   Modules accepted: Orders

## 2017-06-29 NOTE — Progress Notes (Signed)
Pt refuse to stay for 30 minute post Perjeta infusion observation. Pt was encouraged to stay and explanation/education given. Pt states she will be fine, pt stable upon discharge.

## 2017-06-29 NOTE — Patient Instructions (Addendum)
[facility] Discharge Instructions for Patients Receiving Chemotherapy  Today you received the following chemotherapy agents : Herceptin and Perjeta  To help prevent nausea and vomiting after your treatment, we encourage you to take your nausea medication as directed  If you develop nausea and vomiting that is not controlled by your nausea medication, call the clinic.   BELOW ARE SYMPTOMS THAT SHOULD BE REPORTED IMMEDIATELY:   *FEVER GREATER THAN 100.5 F  *CHILLS WITH OR WITHOUT FEVER  NAUSEA AND VOMITING THAT IS NOT CONTROLLED WITH YOUR NAUSEA MEDICATION  *UNUSUAL SHORTNESS OF BREATH  *UNUSUAL BRUISING OR BLEEDING  TENDERNESS IN MOUTH AND THROAT WITH OR WITHOUT PRESENCE OF ULCERS  *URINARY PROBLEMS  *BOWEL PROBLEMS  UNUSUAL RASH Items with * indicate a potential emergency and should be followed up as soon as possible.  Feel free to call the clinic should you have any questions or concerns. The clinic phone number is [phone].  Please show the [place] at check-in to the Emergency Department and triage nurse.  [facility] Discharge Instructions for Patients Receiving Chemotherapy  Today you received the following chemotherapy agents Herceptin and Perjeta  To help prevent nausea and vomiting after your treatment, we encourage you to take your nausea medication as directed   If you develop nausea and vomiting that is not controlled by your nausea medication, call the clinic.   BELOW ARE SYMPTOMS THAT SHOULD BE REPORTED IMMEDIATELY:   *FEVER GREATER THAN 100.5 F  *CHILLS WITH OR WITHOUT FEVER  NAUSEA AND VOMITING THAT IS NOT CONTROLLED WITH YOUR NAUSEA MEDICATION  *UNUSUAL SHORTNESS OF BREATH  *UNUSUAL BRUISING OR BLEEDING  TENDERNESS IN MOUTH AND THROAT WITH OR WITHOUT PRESENCE OF ULCERS  *URINARY PROBLEMS  *BOWEL PROBLEMS  UNUSUAL RASH Items with * indicate a potential emergency and should be followed up as soon  as possible.  Feel free to call the clinic should you have any questions or concerns. The clinic phone number is [phone].  Please show the [place] at check-in to the Emergency Department and triage nurse.

## 2017-07-19 ENCOUNTER — Other Ambulatory Visit: Payer: Self-pay | Admitting: General Surgery

## 2017-07-19 DIAGNOSIS — Z1231 Encounter for screening mammogram for malignant neoplasm of breast: Secondary | ICD-10-CM

## 2017-07-20 ENCOUNTER — Ambulatory Visit (HOSPITAL_BASED_OUTPATIENT_CLINIC_OR_DEPARTMENT_OTHER): Payer: BLUE CROSS/BLUE SHIELD

## 2017-07-20 ENCOUNTER — Ambulatory Visit (HOSPITAL_BASED_OUTPATIENT_CLINIC_OR_DEPARTMENT_OTHER): Payer: BLUE CROSS/BLUE SHIELD | Admitting: Hematology and Oncology

## 2017-07-20 ENCOUNTER — Other Ambulatory Visit (HOSPITAL_BASED_OUTPATIENT_CLINIC_OR_DEPARTMENT_OTHER): Payer: BLUE CROSS/BLUE SHIELD

## 2017-07-20 ENCOUNTER — Ambulatory Visit: Payer: BLUE CROSS/BLUE SHIELD

## 2017-07-20 VITALS — BP 117/85 | HR 71 | Temp 98.2°F | Resp 18

## 2017-07-20 DIAGNOSIS — C50411 Malignant neoplasm of upper-outer quadrant of right female breast: Secondary | ICD-10-CM

## 2017-07-20 DIAGNOSIS — Z170 Estrogen receptor positive status [ER+]: Secondary | ICD-10-CM | POA: Diagnosis not present

## 2017-07-20 DIAGNOSIS — Z5112 Encounter for antineoplastic immunotherapy: Secondary | ICD-10-CM | POA: Diagnosis not present

## 2017-07-20 DIAGNOSIS — Z95828 Presence of other vascular implants and grafts: Secondary | ICD-10-CM

## 2017-07-20 LAB — COMPREHENSIVE METABOLIC PANEL
ALT: 12 U/L (ref 0–55)
ANION GAP: 9 meq/L (ref 3–11)
AST: 13 U/L (ref 5–34)
Albumin: 3.5 g/dL (ref 3.5–5.0)
Alkaline Phosphatase: 86 U/L (ref 40–150)
BUN: 12.1 mg/dL (ref 7.0–26.0)
CALCIUM: 9 mg/dL (ref 8.4–10.4)
CHLORIDE: 109 meq/L (ref 98–109)
CO2: 25 meq/L (ref 22–29)
Creatinine: 0.6 mg/dL (ref 0.6–1.1)
Glucose: 86 mg/dl (ref 70–140)
POTASSIUM: 3.7 meq/L (ref 3.5–5.1)
Sodium: 143 mEq/L (ref 136–145)
Total Bilirubin: 0.73 mg/dL (ref 0.20–1.20)
Total Protein: 6.4 g/dL (ref 6.4–8.3)

## 2017-07-20 LAB — CBC WITH DIFFERENTIAL/PLATELET
BASO%: 0.2 % (ref 0.0–2.0)
Basophils Absolute: 0 10*3/uL (ref 0.0–0.1)
EOS ABS: 0.2 10*3/uL (ref 0.0–0.5)
EOS%: 4 % (ref 0.0–7.0)
HEMATOCRIT: 36.7 % (ref 34.8–46.6)
HGB: 12 g/dL (ref 11.6–15.9)
LYMPH%: 14.9 % (ref 14.0–49.7)
MCH: 32.3 pg (ref 25.1–34.0)
MCHC: 32.7 g/dL (ref 31.5–36.0)
MCV: 98.9 fL (ref 79.5–101.0)
MONO#: 0.4 10*3/uL (ref 0.1–0.9)
MONO%: 7.3 % (ref 0.0–14.0)
NEUT#: 4.5 10*3/uL (ref 1.5–6.5)
NEUT%: 73.6 % (ref 38.4–76.8)
PLATELETS: 166 10*3/uL (ref 145–400)
RBC: 3.71 10*6/uL (ref 3.70–5.45)
RDW: 13.2 % (ref 11.2–14.5)
WBC: 6.1 10*3/uL (ref 3.9–10.3)
lymph#: 0.9 10*3/uL (ref 0.9–3.3)
nRBC: 0 % (ref 0–0)

## 2017-07-20 MED ORDER — SODIUM CHLORIDE 0.9 % IV SOLN
Freq: Once | INTRAVENOUS | Status: AC
  Administered 2017-07-20: 10:00:00 via INTRAVENOUS

## 2017-07-20 MED ORDER — SODIUM CHLORIDE 0.9 % IV SOLN
450.0000 mg | Freq: Once | INTRAVENOUS | Status: AC
  Administered 2017-07-20: 450 mg via INTRAVENOUS
  Filled 2017-07-20: qty 21.43

## 2017-07-20 MED ORDER — DIPHENHYDRAMINE HCL 25 MG PO CAPS
50.0000 mg | ORAL_CAPSULE | Freq: Once | ORAL | Status: AC
  Administered 2017-07-20: 25 mg via ORAL

## 2017-07-20 MED ORDER — DIPHENHYDRAMINE HCL 25 MG PO CAPS
ORAL_CAPSULE | ORAL | Status: AC
  Filled 2017-07-20: qty 1

## 2017-07-20 MED ORDER — SODIUM CHLORIDE 0.9 % IV SOLN
420.0000 mg | Freq: Once | INTRAVENOUS | Status: AC
  Administered 2017-07-20: 420 mg via INTRAVENOUS
  Filled 2017-07-20: qty 14

## 2017-07-20 MED ORDER — ACETAMINOPHEN 325 MG PO TABS
650.0000 mg | ORAL_TABLET | Freq: Once | ORAL | Status: AC
  Administered 2017-07-20: 650 mg via ORAL

## 2017-07-20 MED ORDER — ACETAMINOPHEN 325 MG PO TABS
ORAL_TABLET | ORAL | Status: AC
  Filled 2017-07-20: qty 2

## 2017-07-20 MED ORDER — SODIUM CHLORIDE 0.9% FLUSH
10.0000 mL | INTRAVENOUS | Status: DC | PRN
  Administered 2017-07-20: 10 mL via INTRAVENOUS
  Filled 2017-07-20: qty 10

## 2017-07-20 MED ORDER — HEPARIN SOD (PORK) LOCK FLUSH 100 UNIT/ML IV SOLN
500.0000 [IU] | Freq: Once | INTRAVENOUS | Status: AC | PRN
Start: 2017-07-20 — End: 2017-07-20
  Administered 2017-07-20: 500 [IU]
  Filled 2017-07-20: qty 5

## 2017-07-20 MED ORDER — SODIUM CHLORIDE 0.9% FLUSH
10.0000 mL | INTRAVENOUS | Status: DC | PRN
  Administered 2017-07-20: 10 mL
  Filled 2017-07-20: qty 10

## 2017-07-20 NOTE — Assessment & Plan Note (Addendum)
Rt Biopsy 11oclock and 2 o clock: Grade 3 IDC with LVI; LN biopsy: Positive IDC; 11oclock: Er 100%, PR 90%, Ki 67: 90%; Her 2 Neg Heterogeneous; LN: Er 100%, PR: 90%; Ki67: 60%; Her 2 Pos Ratio 2.37; 2oclock: Er 100%, PR 90%; Ki 67: 90%; Her 2 Positive 2.52 Breast MRI on 08/13/2016: 8 cm span of breast cancer with multiple enlarged axillary lymph nodes. CT CAP: 08/17/16: 4 mm right middle lobe pulmonary nodule unlikely to be metastatic disease Bone scan 08/17/2016: No evidence of metastatic disease  Echocardiogram performed on 05/17/2017 showed a left ventricular ejection fraction of 60-65%.  Treatment summary: 1. Neoadjuvant chemotherapy with TCH Perjeta 6 cycles given 08/16/16 to 12/01/16 followed by Herceptin Perjetamaintenance for 1 year  2. Followed by mastectomy with axillary node dissection 01/03/2017 3. Adjuvant radiation therapycompleted 03/26/2017 4. Followed by Anti-estrogen therapy started 04/06/2017 5. Followed by Neratinib for 1 year -------------------------------------------------------------------------------------------------------------------------------------------------------------------------------------------------------------------------------------- 01/04/2007 Right mastectomy: Multiple IDC with micropapillary features, LVI, grade 3, 1.5, 0.6, 0.5 cm, 12/20 lymph nodes positive for cancer, margins negative, ER 100%, PR 90%, HER-2 positive, Ki-67 60%,ypT1c ypN3 RCB class III; stage IIIB  Adjuvant radiation therapy started 07/09/2018completed 03/26/2017  Current treatment: Herceptin Perjeta maintenance completed 07/20/2017 (today is her last treatment) Anastrozole started 04/06/17  Anastrozole toxicities:  I discussed with the patient the role of Neratinib in decreasing cancer recurrence. We will consider starting Neratinib 3 months.  Chemo-induced peripheral neuropathy: Currently on gabapentin 100 mg twice daily  Port to be removed  Return to clinic in 3  months for follow-up.

## 2017-07-20 NOTE — Patient Instructions (Signed)
[facility] Discharge Instructions for Patients Receiving Chemotherapy  Today you received the following chemotherapy agents Herceptin; Perjeta  To help prevent nausea and vomiting after your treatment, we encourage you to take your nausea medication as directed   If you develop nausea and vomiting that is not controlled by your nausea medication, call the clinic.   BELOW ARE SYMPTOMS THAT SHOULD BE REPORTED IMMEDIATELY:  *FEVER GREATER THAN 100.5 F  *CHILLS WITH OR WITHOUT FEVER  NAUSEA AND VOMITING THAT IS NOT CONTROLLED WITH YOUR NAUSEA MEDICATION  *UNUSUAL SHORTNESS OF BREATH  *UNUSUAL BRUISING OR BLEEDING  TENDERNESS IN MOUTH AND THROAT WITH OR WITHOUT PRESENCE OF ULCERS  *URINARY PROBLEMS  *BOWEL PROBLEMS  UNUSUAL RASH Items with * indicate a potential emergency and should be followed up as soon as possible.  Feel free to call the clinic should you have any questions or concerns. The clinic phone number is [phone].  Please show the CHEMO ALERT CARD at check-in to the Emergency Department and triage nurse.   

## 2017-07-20 NOTE — Progress Notes (Signed)
Patient Care Team: [name], MD as PCP - General (Pediatrics)  DIAGNOSIS:  Encounter Diagnosis  Name Primary?  . Malignant neoplasm of upper-outer quadrant of right breast in female, estrogen receptor positive ([place])     SUMMARY OF ONCOLOGIC HISTORY:   Breast cancer of upper-outer quadrant of right female breast ([place])   07/24/2016 Mammogram    Rt Breast 2 masses at 11-12:30 Position spanning 8 cm; Additional mass at 2 o clock: 1.5 cm; LN with cort thickening 2.7 cm      07/27/2016 Initial Diagnosis    Rt Biopsy 11oclock and 2 o clock: Grade 3 IDC with LVI; LN biopsy: Positive IDC; 11oclock: Er 100%, PR 90%, Ki 67: 90%; Her 2 Neg Heterogeneous; LN: Er 100%, PR: 90%; Ki67: 60%; Her 2 Pos Ratio 2.37; 2oclock: Er 100%, PR 90%; Ki 67: 90%; Her 2 Positive 2.52      08/16/2016 - 12/01/2016 Neo-Adjuvant Chemotherapy    Neoadjuvant chemotherapy with TCH Perjeta (Taxotere d/ced after 2 cycles)      12/06/2016 Breast MRI    Residual right breast 2 o'clock enhancing mass which now measures 1.3 cm in greatest dimension.Residual non measurable scattered foci of heterogeneous enhancement throughout the superior and subareolar right breast, anterior and middle depth. Diminished right axillary lymphadenopathy      01/03/2017 Surgery    Right mastectomy: Multiple IDC with micropapillary features, LVI, grade 3, 1.5, 0.6, 0.5 cm, 12/20 lymph nodes positive for cancer, margins negative, ER 100%, PR 90%, HER-2 positive, Ki-67 60%,ypT1c ypN3 RCB class III; stage IIIB      02/12/2017 - 03/26/2017 Radiation Therapy    Adjuvant radiation therapy      04/06/2017 -  Anti-estrogen oral therapy    Anastrozole 1 mg by mouth daily      CHIEF COMPLIANT: Last dose of Herceptin  INTERVAL HISTORY: Cynthia Chavez is a 61 year old with above-mentioned history of right breast cancer who underwent neoadjuvant chemotherapy followed by mastectomy and radiation.  She is currently on anastrozole therapy and  Herceptin.  Today is her last dose of Herceptin.  She has scheduled for port removal coming up with Dr. Donne Hazel.  She appears to be tolerating anastrozole fairly well.  She does have right-sided chest wall and axillary pain as well as right arm pain.  She was prescribed gabapentin but she had nightmares and she stopped taking it.  REVIEW OF SYSTEMS:   Constitutional: Denies fevers, chills or abnormal weight loss Eyes: Denies blurriness of vision Ears, nose, mouth, throat, and face: Denies mucositis or sore throat Respiratory: Denies cough, dyspnea or wheezes Cardiovascular: Denies palpitation, chest discomfort Gastrointestinal:  Denies nausea, heartburn or change in bowel habits Skin: Denies abnormal skin rashes Lymphatics: Denies new lymphadenopathy or easy bruising Neurological:Denies numbness, tingling or new weaknesses Behavioral/Psych: Mood is stable, no new changes  Extremities: Right arm pain.  All other systems were reviewed with the patient and are negative.  I have reviewed the past medical history, past surgical history, social history and family history with the patient and they are unchanged from previous note.  ALLERGIES:  is allergic to ivp dye [iodinated diagnostic agents]; other; dilaudid [hydromorphone hcl]; asa [aspirin]; and codeine.  MEDICATIONS:  Current Outpatient Medications  Medication Sig Dispense Refill  . anastrozole (ARIMIDEX) 1 MG tablet Take 1 tablet (1 mg total) by mouth daily. 90 tablet 3  . levothyroxine (SYNTHROID, LEVOTHROID) 150 MCG tablet Take 1 tablet (150 mcg total) by mouth daily before breakfast. 90 tablet 1  No current facility-administered medications for this visit.     PHYSICAL EXAMINATION: ECOG PERFORMANCE STATUS: 1 - Symptomatic but completely ambulatory  Vitals:   07/20/17 0847  BP: 127/76  Pulse: 80  Resp: 18  Temp: (!) 97.4 F (36.3 C)  SpO2: 100%   Filed Weights   07/20/17 0847  Weight: 171 lb 14.4 oz (78 kg)     GENERAL:alert, no distress and comfortable SKIN: skin color, texture, turgor are normal, no rashes or significant lesions EYES: normal, Conjunctiva are pink and non-injected, sclera clear OROPHARYNX:no exudate, no erythema and lips, buccal mucosa, and tongue normal  NECK: supple, thyroid normal size, non-tender, without nodularity LYMPH:  no palpable lymphadenopathy in the cervical, axillary or inguinal LUNGS: clear to auscultation and percussion with normal breathing effort HEART: regular rate & rhythm and no murmurs and no lower extremity edema ABDOMEN:abdomen soft, non-tender and normal bowel sounds MUSCULOSKELETAL:no cyanosis of digits and no clubbing  NEURO: alert & oriented x 3 with fluent speech, no focal motor/sensory deficits EXTREMITIES: No lower extremity edema  LABORATORY DATA:  I have reviewed the data as listed   Chemistry      Component Value Date/Time   NA 143 07/20/2017 0749   K 3.7 07/20/2017 0749   CL 107 08/14/2016 1605   CO2 25 07/20/2017 0749   BUN 12.1 07/20/2017 0749   CREATININE 0.6 07/20/2017 0749      Component Value Date/Time   CALCIUM 9.0 07/20/2017 0749   ALKPHOS 86 07/20/2017 0749   AST 13 07/20/2017 0749   ALT 12 07/20/2017 0749   BILITOT 0.73 07/20/2017 0749       Lab Results  Component Value Date   WBC 6.1 07/20/2017   HGB 12.0 07/20/2017   HCT 36.7 07/20/2017   MCV 98.9 07/20/2017   PLT 166 07/20/2017   NEUTROABS 4.5 07/20/2017    ASSESSMENT & PLAN:  Breast cancer of upper-outer quadrant of right female breast ([place]) Rt Biopsy 11oclock and 2 o clock: Grade 3 IDC with LVI; LN biopsy: Positive IDC; 11oclock: Er 100%, PR 90%, Ki 67: 90%; Her 2 Neg Heterogeneous; LN: Er 100%, PR: 90%; Ki67: 60%; Her 2 Pos Ratio 2.37; 2oclock: Er 100%, PR 90%; Ki 67: 90%; Her 2 Positive 2.52 Breast MRI on 08/13/2016: 8 cm span of breast cancer with multiple enlarged axillary lymph nodes. CT CAP: 08/17/16: 4 mm right middle lobe pulmonary nodule unlikely  to be metastatic disease Bone scan 08/17/2016: No evidence of metastatic disease  Echocardiogram performed on 05/17/2017 showed a left ventricular ejection fraction of 60-65%.  Treatment summary: 1. Neoadjuvant chemotherapy with TCH Perjeta 6 cycles given 08/16/16 to 12/01/16 followed by Herceptin Perjetamaintenance for 1 year  2. Followed by mastectomy with axillary node dissection 01/03/2017 3. Adjuvant radiation therapycompleted 03/26/2017 4. Followed by Anti-estrogen therapy started 04/06/2017 5. Followed by Neratinib for 1 year -------------------------------------------------------------------------------------------------------------------------------------------------------------------------------------------------------------------------------------- 01/04/2007 Right mastectomy: Multiple IDC with micropapillary features, LVI, grade 3, 1.5, 0.6, 0.5 cm, 12/20 lymph nodes positive for cancer, margins negative, ER 100%, PR 90%, HER-2 positive, Ki-67 60%,ypT1c ypN3 RCB class III; stage IIIB  Adjuvant radiation therapy started 07/09/2018completed 03/26/2017  Current treatment: Herceptin Perjeta maintenance completed 07/20/2017 (today is her last treatment) Anastrozole started 04/06/17  Anastrozole toxicities: Denies any hot flashes or myalgias  I discussed with the patient the role of Neratinib in decreasing cancer recurrence. She will call us in February to inform us when she wants to start Neratinib.  Right arm pain: She was prescribed gabapentin but it  caused nightmares so she discontinued it. Port to be removed  Return to clinic in June 2019 for follow-up.   I spent 25 minutes talking to the patient of which more than half was spent in counseling and coordination of care.  No orders of the defined types were placed in this encounter.  The patient has a good understanding of the overall plan. she agrees with it. she will call with any problems that may develop before  the next visit here.   Rulon Eisenmenger, MD 07/20/17

## 2017-07-21 ENCOUNTER — Telehealth: Payer: Self-pay | Admitting: Hematology and Oncology

## 2017-07-21 NOTE — Telephone Encounter (Signed)
Spoke with patient re f/u June 2019. Schedule mailed.

## 2017-07-24 ENCOUNTER — Encounter: Payer: Self-pay | Admitting: *Deleted

## 2017-07-27 ENCOUNTER — Ambulatory Visit
Admission: RE | Admit: 2017-07-27 | Discharge: 2017-07-27 | Disposition: A | Discharge status: Home / Self Care | Payer: BLUE CROSS/BLUE SHIELD | Source: Ambulatory Visit | Attending: General Surgery | Admitting: General Surgery

## 2017-07-27 DIAGNOSIS — Z1231 Encounter for screening mammogram for malignant neoplasm of breast: Secondary | ICD-10-CM

## 2017-07-27 HISTORY — DX: Malignant neoplasm of unspecified site of unspecified female breast: C50.919

## 2017-08-31 ENCOUNTER — Telehealth: Payer: Self-pay

## 2017-08-31 NOTE — Telephone Encounter (Signed)
Pt called to notify Dr.Gudena that it has been 1 month since her last infusion. Pt is now ready to discuss and continue treatment with Neratinib. Pt was told to call end of January to let Dr.Gudena know if she is interested in starting. Pt would like to start this medication, but need to know cost and patient assistance.   Told pt Dr.Gudena is out of the office today but will follow up with him on Monday and see if she needs to come back for lab work and to meet with oral oncology pharmacist. Pt verbalize understanding and will wait for call next week. No further needs at this time.

## 2017-09-03 ENCOUNTER — Other Ambulatory Visit: Payer: Self-pay

## 2017-09-03 DIAGNOSIS — C50411 Malignant neoplasm of upper-outer quadrant of right female breast: Secondary | ICD-10-CM

## 2017-09-03 DIAGNOSIS — Z170 Estrogen receptor positive status [ER+]: Principal | ICD-10-CM

## 2017-09-05 ENCOUNTER — Telehealth: Payer: Self-pay | Admitting: Pharmacy Technician

## 2017-09-05 ENCOUNTER — Telehealth: Payer: Self-pay | Admitting: Pharmacist

## 2017-09-05 NOTE — Assessment & Plan Note (Signed)
Rt Biopsy 11oclock and 2 o clock: Grade 3 IDC with LVI; LN biopsy: Positive IDC; 11oclock: Er 100%, PR 90%, Ki 67: 90%; Her 2 Neg Heterogeneous; LN: Er 100%, PR: 90%; Ki67: 60%; Her 2 Pos Ratio 2.37; 2oclock: Er 100%, PR 90%; Ki 67: 90%; Her 2 Positive 2.52 Breast MRI on 08/13/2016: 8 cm span of breast cancer with multiple enlarged axillary lymph nodes. CT CAP: 08/17/16: 4 mm right middle lobe pulmonary nodule unlikely to be metastatic disease Bone scan 08/17/2016: No evidence of metastatic disease  Echocardiogram performed on 05/17/2017 showed a left ventricular ejection fraction of 60-65%.  Treatment summary: 1. Neoadjuvant chemotherapy with [place] Perjeta 6 cyclesgiven1/10/18 to 12/01/16 followed by Herceptin Perjetamaintenance for 1 year  2. Followed by mastectomy with axillary node dissection 01/03/2017 3. Adjuvant radiation therapycompleted 03/26/2017 4. Followed by Anti-estrogen therapy started 04/06/2017 5. Followed by Neratinib for 1 year -------------------------------------------------------------------------------------------------------------------------------------------------------------------------------------------------------------------------------------- 01/04/2007 Right mastectomy: Multiple IDC with micropapillary features, LVI, grade 3, 1.5, 0.6, 0.5 cm, 12/20 lymph nodes positive for cancer, margins negative, ER 100%, PR 90%, HER-2 positive, Ki-67 60%,ypT1c ypN3 RCB class III; stage IIIB  Adjuvant radiation therapy started 07/09/2018completed 03/26/2017  Current treatment: Neratinib (started 09/01/17) and Anastrozole started 04/06/17  Anastrozole toxicities: Denies any hot flashes or myalgias  Neratinib Toxicities:   RTC in 1 month for follow up

## 2017-09-05 NOTE — Telephone Encounter (Signed)
Oral Oncology Pharmacist Encounter  Received new referral for possible Nerlynx (neratinib) new start for the extended adjuvant treatment of stage IIIB hormone receptor positive, Her-2 positive breast cancer in conjunction with continued anastrazole, planned duration 1 year of Nerlynx. Last Herceptin infusion 07/20/17  Labs from 07/20/17 assessed, OK for treatment.  Current medication list in Epic reviewed, no DDIs with Nerlynx identified:  I will plan to see patient in office at 09/06/17 office visit with Dr. Lindi Adie to describe specialty pharmacy process. Patient noted to have [employer]. Nerlynx is a limited distribution medication.  Oral Oncology Clinic will continue to follow for insurance authorization, copayment issues, initial counseling and start date.  Johny Drilling, PharmD, BCPS, BCOP 09/05/2017 8:34 AM Oral Oncology Clinic [phone]

## 2017-09-05 NOTE — Telephone Encounter (Signed)
Oral Oncology Patient Advocate Encounter  Received notification from [place] that prior authorization for Nerlynx is required.  PA submitted on CoverMyMeds Key [id number] Status is pending  Oral Oncology Clinic will continue to follow.  Fabio Asa. Melynda Keller, [place] Patient [place] [phone] 09/05/2017 12:12 PM

## 2017-09-06 ENCOUNTER — Inpatient Hospital Stay: Payer: BLUE CROSS/BLUE SHIELD | Attending: Hematology and Oncology | Admitting: Hematology and Oncology

## 2017-09-06 ENCOUNTER — Inpatient Hospital Stay: Payer: BLUE CROSS/BLUE SHIELD

## 2017-09-06 ENCOUNTER — Telehealth: Payer: Self-pay | Admitting: Pharmacist

## 2017-09-06 DIAGNOSIS — Z923 Personal history of irradiation: Secondary | ICD-10-CM

## 2017-09-06 DIAGNOSIS — C50411 Malignant neoplasm of upper-outer quadrant of right female breast: Secondary | ICD-10-CM

## 2017-09-06 DIAGNOSIS — Z9011 Acquired absence of right breast and nipple: Secondary | ICD-10-CM | POA: Diagnosis not present

## 2017-09-06 DIAGNOSIS — Z170 Estrogen receptor positive status [ER+]: Principal | ICD-10-CM

## 2017-09-06 DIAGNOSIS — Z79811 Long term (current) use of aromatase inhibitors: Secondary | ICD-10-CM

## 2017-09-06 LAB — CMP (CANCER CENTER ONLY)
ALT: 12 U/L (ref 0–55)
ANION GAP: 9 (ref 3–11)
AST: 13 U/L (ref 5–34)
Albumin: 3.8 g/dL (ref 3.5–5.0)
Alkaline Phosphatase: 109 U/L (ref 40–150)
BUN: 13 mg/dL (ref 7–26)
CHLORIDE: 105 mmol/L (ref 98–109)
CO2: 28 mmol/L (ref 22–29)
Calcium: 9.4 mg/dL (ref 8.4–10.4)
Creatinine: 0.73 mg/dL (ref 0.60–1.10)
GFR, Estimated: >60 mL/min (ref >60)
Glucose, Bld: 93 mg/dL (ref 70–140)
Potassium: 3.6 mmol/L (ref 3.5–5.1)
SODIUM: 142 mmol/L (ref 136–145)
Total Bilirubin: 0.4 mg/dL (ref 0.2–1.2)
Total Protein: 7.1 g/dL (ref 6.4–8.3)

## 2017-09-06 LAB — CBC WITH DIFFERENTIAL (CANCER CENTER ONLY)
BASOS PCT: 1 %
Basophils Absolute: 0 10*3/uL (ref 0.0–0.1)
Eosinophils Absolute: 0.3 10*3/uL (ref 0.0–0.5)
Eosinophils Relative: 5 %
HEMATOCRIT: 40.4 % (ref 34.8–46.6)
HEMOGLOBIN: 13.5 g/dL (ref 11.6–15.9)
LYMPHS ABS: 1 10*3/uL (ref 0.9–3.3)
Lymphocytes Relative: 17 %
MCH: 32.2 pg (ref 25.1–34.0)
MCHC: 33.4 g/dL (ref 31.5–36.0)
MCV: 96.2 fL (ref 79.5–101.0)
MONOS PCT: 6 %
Monocytes Absolute: 0.3 10*3/uL (ref 0.1–0.9)
NEUTROS ABS: 4.4 10*3/uL (ref 1.5–6.5)
NEUTROS PCT: 71 %
Platelet Count: 185 10*3/uL (ref 145–400)
RBC: 4.2 MIL/uL (ref 3.70–5.45)
RDW: 13 % (ref 11.2–14.5)
WBC Count: 6 10*3/uL (ref 3.9–10.3)

## 2017-09-06 MED ORDER — NERATINIB MALEATE 40 MG PO TABS: 240.0000 mg | ORAL_TABLET | Freq: Every day | ORAL | 3 refills | Status: DC

## 2017-09-06 NOTE — Telephone Encounter (Signed)
Oral Oncology Patient Advocate Encounter  Prior Authorization for Nerlynx has been approved.    PA# [id number] Effective dates: 09/05/2017 through 09/05/2018  Oral Oncology Clinic will continue to follow.   Fabio Asa. Melynda Keller, [place] Patient [place] [phone] 09/06/2017 8:30 AM

## 2017-09-06 NOTE — Telephone Encounter (Signed)
Oral Chemotherapy Pharmacist Encounter   I spoke with patient in exam room for overview of: Nerlynx (neratinib) for the maintenance adjuvant treatment of triple positive breast cancer, after completion of Herceptin, planned duration one year. Last Herceptin infusion: 07/20/17   Pt is doing well. Counseled patient on administration, dosing, side effects, monitoring, drug-food interactions, safe handling, storage, and disposal.  Patient will take Nerlynx 40mg  tablets, 6 tablets (240mg ) by mouth once daily with food.  Patient knows to avoid grapefruit and grapefruit juice while on treatment with Nerlynx.  Patient instructed to avoid use of PPIs or H2RAs while on treatment with Nerlynx, can separate antacids from Nerlynx by 3 hours if acid suppression is needed.  Nerlynx start date: TBD, pending medication acquisition Likely ~09/14/17  Side effects include but not limited to: diarrhea, nausea, vomiting, mouth sores, fatigue, rash, and abdominal pain.    Patient instructed on antidiarrheal prophylaxis with loperamide and provided written instructions:  Loperamide 2mg  by mouth TID for weeks 1-2  Loperamide 2mg  by mouth BID for weeks 3-8  Loperamide 2mg  by mouth as needed for diarrhea for remaining length of therapy  Patient instructed to increase or decrease loperamide dose to maintain 1-2 bowel movements per day  Reviewed with patient importance of keeping a medication schedule and plan for any missed doses.  Ms. Garrette voiced understanding and appreciation.   All questions answered. Medication reconciliation performed and medication/allergy list updated.  Nerlynx prescription e-scribed to [facility] (ph: [phone]) as Nerlynx is a limited distribution medication and patient also with [employer]. Patient provided dispensing pharmacy phone number and phone number to oral oncology clinic.Marland Kitchen   Patient knows to call the office with questions or concerns.  Oral Oncology Clinic will continue to follow.  Thank you,  Johny Drilling, PharmD, BCPS, BCOP 09/06/2017   4:33 PM Oral Oncology Clinic [phone]

## 2017-09-06 NOTE — Progress Notes (Signed)
Patient Care Team: [name], MD as PCP - General (Pediatrics)  DIAGNOSIS:  Encounter Diagnosis  Name Primary?  . Malignant neoplasm of upper-outer quadrant of right breast in female, estrogen receptor positive ([place])     SUMMARY OF ONCOLOGIC HISTORY:   Breast cancer of upper-outer quadrant of right female breast ([place])   07/24/2016 Mammogram    Rt Breast 2 masses at 11-12:30 Position spanning 8 cm; Additional mass at 2 o clock: 1.5 cm; LN with cort thickening 2.7 cm      07/27/2016 Initial Diagnosis    Rt Biopsy 11oclock and 2 o clock: Grade 3 IDC with LVI; LN biopsy: Positive IDC; 11oclock: Er 100%, PR 90%, Ki 67: 90%; Her 2 Neg Heterogeneous; LN: Er 100%, PR: 90%; Ki67: 60%; Her 2 Pos Ratio 2.37; 2oclock: Er 100%, PR 90%; Ki 67: 90%; Her 2 Positive 2.52      08/16/2016 - 12/01/2016 Neo-Adjuvant Chemotherapy    Neoadjuvant chemotherapy with TCH Perjeta (Taxotere d/ced after 2 cycles)      12/06/2016 Breast MRI    Residual right breast 2 o'clock enhancing mass which now measures 1.3 cm in greatest dimension.Residual non measurable scattered foci of heterogeneous enhancement throughout the superior and subareolar right breast, anterior and middle depth. Diminished right axillary lymphadenopathy      01/03/2017 Surgery    Right mastectomy: Multiple IDC with micropapillary features, LVI, grade 3, 1.5, 0.6, 0.5 cm, 12/20 lymph nodes positive for cancer, margins negative, ER 100%, PR 90%, HER-2 positive, Ki-67 60%,ypT1c ypN3 RCB class III; stage IIIB      02/12/2017 - 03/26/2017 Radiation Therapy    Adjuvant radiation therapy      04/06/2017 -  Anti-estrogen oral therapy    Anastrozole 1 mg by mouth daily       CHIEF COMPLIANT: Patient is here to discuss starting Neratinib therapy  INTERVAL HISTORY: Cynthia Chavez is a 62 year old with above-mentioned history of HER-2 positive right breast cancer treated with neoadjuvant chemotherapy followed by mastectomy and radiation.  She  is here today to discuss starting anti-HER-2 therapy with Neratinib.  She appears to be tolerating anastrozole extremely well except for hot flashes as well as increased appetite and potentially weight gain.  REVIEW OF SYSTEMS:   Constitutional: Denies fevers, chills or abnormal weight loss Eyes: Denies blurriness of vision Ears, nose, mouth, throat, and face: Denies mucositis or sore throat Respiratory: Denies cough, dyspnea or wheezes Cardiovascular: Denies palpitation, chest discomfort Gastrointestinal:  Denies nausea, heartburn or change in bowel habits Skin: Denies abnormal skin rashes Lymphatics: Denies new lymphadenopathy or easy bruising Neurological:Denies numbness, tingling or new weaknesses Behavioral/Psych: Mood is stable, no new changes  Extremities: No lower extremity edema Breast:  denies any pain or lumps or nodules in either breasts All other systems were reviewed with the patient and are negative.  I have reviewed the past medical history, past surgical history, social history and family history with the patient and they are unchanged from previous note.  ALLERGIES:  is allergic to ivp dye [iodinated diagnostic agents]; other; dilaudid [hydromorphone hcl]; asa [aspirin]; and codeine.  MEDICATIONS:  Current Outpatient Medications  Medication Sig Dispense Refill  . anastrozole (ARIMIDEX) 1 MG tablet Take 1 tablet (1 mg total) by mouth daily. 90 tablet 3  . levothyroxine (SYNTHROID, LEVOTHROID) 150 MCG tablet Take 1 tablet (150 mcg total) by mouth daily before breakfast. 90 tablet 1  . Neratinib Maleate (NERLYNX) 40 MG tablet Take 6 tablets (240 mg total) by mouth  daily. Take with food. 180 tablet 3   No current facility-administered medications for this visit.     PHYSICAL EXAMINATION: ECOG PERFORMANCE STATUS: 1 - Symptomatic but completely ambulatory  Vitals:   09/06/17 1533  BP: 131/88  Pulse: 80  Resp: 20  Temp: 97.8 F (36.6 C)  SpO2: 100%   Filed  Weights   09/06/17 1533  Weight: 172 lb 9.6 oz (78.3 kg)    GENERAL:alert, no distress and comfortable SKIN: skin color, texture, turgor are normal, no rashes or significant lesions EYES: normal, Conjunctiva are pink and non-injected, sclera clear OROPHARYNX:no exudate, no erythema and lips, buccal mucosa, and tongue normal  NECK: supple, thyroid normal size, non-tender, without nodularity LYMPH:  no palpable lymphadenopathy in the cervical, axillary or inguinal LUNGS: clear to auscultation and percussion with normal breathing effort HEART: regular rate & rhythm and no murmurs and no lower extremity edema ABDOMEN:abdomen soft, non-tender and normal bowel sounds MUSCULOSKELETAL:no cyanosis of digits and no clubbing  NEURO: alert & oriented x 3 with fluent speech, no focal motor/sensory deficits EXTREMITIES: No lower extremity edema   LABORATORY DATA:  I have reviewed the data as listed CMP Latest Ref Rng & Units 09/06/2017 07/20/2017 06/29/2017  Glucose 70 - 140 mg/dL 93 86 84  BUN 7 - 26 mg/dL 13 12.1 15.2  Creatinine 0.6 - 1.1 mg/dL - 0.6 0.7  Sodium 136 - 145 mmol/L 142 143 142  Potassium 3.5 - 5.1 mmol/L 3.6 3.7 3.9  Chloride 98 - 109 mmol/L 105 - -  CO2 22 - 29 mmol/L '28 25 28  ' Calcium 8.4 - 10.4 mg/dL 9.4 9.0 9.4  Total Protein 6.4 - 8.3 g/dL 7.1 6.4 6.8  Total Bilirubin 0.2 - 1.2 mg/dL 0.4 0.73 0.65  Alkaline Phos 40 - 150 U/L 109 86 85  AST 5 - 34 U/L '13 13 14  ' ALT 0 - 55 U/L '12 12 13    ' Lab Results  Component Value Date   WBC 6.0 09/06/2017   HGB 12.0 07/20/2017   HCT 40.4 09/06/2017   MCV 96.2 09/06/2017   PLT 185 09/06/2017   NEUTROABS 4.4 09/06/2017    ASSESSMENT & PLAN:  Breast cancer of upper-outer quadrant of right female breast ([place]) Rt Biopsy 11oclock and 2 o clock: Grade 3 IDC with LVI; LN biopsy: Positive IDC; 11oclock: Er 100%, PR 90%, Ki 67: 90%; Her 2 Neg Heterogeneous; LN: Er 100%, PR: 90%; Ki67: 60%; Her 2 Pos Ratio 2.37; 2oclock: Er 100%, PR  90%; Ki 67: 90%; Her 2 Positive 2.52 Breast MRI on 08/13/2016: 8 cm span of breast cancer with multiple enlarged axillary lymph nodes. CT CAP: 08/17/16: 4 mm right middle lobe pulmonary nodule unlikely to be metastatic disease Bone scan 08/17/2016: No evidence of metastatic disease  Echocardiogram performed on 05/17/2017 showed a left ventricular ejection fraction of 60-65%.  Treatment summary: 1. Neoadjuvant chemotherapy with [place] Perjeta 6 cyclesgiven1/10/18 to 12/01/16 followed by Herceptin Perjetamaintenance for 1 year  2. Followed by mastectomy with axillary node dissection 01/03/2017 3. Adjuvant radiation therapycompleted 03/26/2017 4. Followed by Anti-estrogen therapy started 04/06/2017 5. Followed by Neratinib for 1 yearstarted 09/07/2017 -------------------------------------------------------------------------------------------------------------------------------------------------------------------------------------------------------------------------------------- 01/04/2007 Right mastectomy: Multiple IDC with micropapillary features, LVI, grade 3, 1.5, 0.6, 0.5 cm, 12/20 lymph nodes positive for cancer, margins negative, ER 100%, PR 90%, HER-2 positive, Ki-67 60%,ypT1c ypN3 RCB class III; stage IIIB  Adjuvant radiation therapy started 07/09/2018completed 03/26/2017  Current treatment: Neratinib (started 09/01/17) and Anastrozole started 04/06/17  Anastrozole toxicities: Denies any  hot flashes or myalgias Patient tells me that she has had increased appetite and has been gaining weight. She wants to join a gym and lose weight.  Neratinib counseling: Neratinib is up and HER-2 inhibitor that is currently approved for treatment of HER-2 positive breast cancer after conclusion of one year of Herceptin treatment. Based on the studies it does appear to have a progression free survival difference of 2% absolute value. The starting dose of the spill is 200 mg once daily. The greatest side  effect of this medication is diarrhea. Patient will need to be on prophylactic antidiarrheal medications. Days 1 to 14: Loperamide 4 mg orally 3 times daily Days 15 to 56: Loperamide 4 mg orally twice daily Days 57 to 365: Loperamide 4 mg as needed (maximum: 16 mg/day) During the treatment will have to monitor the liver functions. Apart from the diarrhea other side effects include fatigue 27%, skin rash 18%, muscle spasms in 11%.   RTC in 1 month for follow up    I spent 25 minutes talking to the patient of which more than half was spent in counseling and coordination of care.  Orders Placed This Encounter  Procedures  . CMP ([facility] only)    Standing Status:   Future    Standing Expiration Date:   09/06/2018  . CBC with Differential (Cancer Center Only)    Standing Status:   Future    Standing Expiration Date:   09/06/2018   The patient has a good understanding of the overall plan. she agrees with it. she will call with any problems that may develop before the next visit here.   Harriette Ohara, MD 09/06/17

## 2017-09-07 ENCOUNTER — Telehealth: Payer: Self-pay | Admitting: Hematology and Oncology

## 2017-09-07 NOTE — Telephone Encounter (Signed)
Mailed patient calendar of upcoming February appointments.

## 2017-09-19 ENCOUNTER — Ambulatory Visit: Payer: BLUE CROSS/BLUE SHIELD | Attending: General Surgery | Admitting: Physical Therapy

## 2017-09-19 ENCOUNTER — Other Ambulatory Visit: Payer: Self-pay

## 2017-09-19 DIAGNOSIS — Z483 Aftercare following surgery for neoplasm: Secondary | ICD-10-CM | POA: Diagnosis present

## 2017-09-19 DIAGNOSIS — M25511 Pain in right shoulder: Secondary | ICD-10-CM

## 2017-09-19 DIAGNOSIS — R293 Abnormal posture: Secondary | ICD-10-CM | POA: Diagnosis present

## 2017-09-19 DIAGNOSIS — L599 Disorder of the skin and subcutaneous tissue related to radiation, unspecified: Secondary | ICD-10-CM | POA: Insufficient documentation

## 2017-09-19 DIAGNOSIS — M25611 Stiffness of right shoulder, not elsewhere classified: Secondary | ICD-10-CM | POA: Insufficient documentation

## 2017-09-19 NOTE — Therapy (Addendum)
[place], [place], [address] Phone: [phone]   Fax:  [phone]  Physical Therapy Evaluation  Patient Details  Name: Cynthia Chavez MRN: [id number] Date of Birth: Jan 05, 1956 Referring Provider: Dr. Rolm Bookbinder   Encounter Date: 09/19/2017  PT End of Session - 09/19/17 2004    Visit Number  1    Number of Visits  9    Date for PT Re-Evaluation  10/26/17    PT Start Time  1347    PT Stop Time  1440    PT Time Calculation (min)  53 min    Activity Tolerance  Patient tolerated treatment well    Behavior During Therapy  [facility] for tasks assessed/performed       Past Medical History:  Diagnosis Date  . Anxiety   . Breast cancer ([place])   . Cancer [facility])    breast cancer  . Depression   . Edema    bilateral feet and leg swelling  . GERD (gastroesophageal reflux disease)   . History of hiatal hernia   . History of radiation therapy 02/12/17- 03/26/17   Chest wall and regional nodes, right- 50 Gy in 25 fractions, Chest Wall, boost, right- 10 Gy in 5 fractions.   . Hypothyroidism   . Malaise   . Thyroid disease   . Varicose veins     Past Surgical History:  Procedure Laterality Date  . ABDOMINAL HYSTERECTOMY    . AXILLARY LYMPH NODE BIOPSY Right 01/03/2017   Procedure: RIGHT AXILLARY LYMPH NODE SEED GUIDED EXCISIONAL BIOPSY;  Surgeon: Rolm Bookbinder, MD;  Location: [place];  Service: General;  Laterality: Right;  . CHOLECYSTECTOMY    . COLONOSCOPY    . ESOPHAGEAL DILATION    . MASTECTOMY Right   . MASTECTOMY W/ SENTINEL NODE BIOPSY Right 01/03/2017   axillary  . MASTECTOMY W/ SENTINEL NODE BIOPSY Right 01/03/2017   Procedure: RIGHT TOTAL MASTECTOMY WITH RIGHT AXILLARY SENTINEL LYMPH NODE BIOPSY;  Surgeon: Rolm Bookbinder, MD;  Location: [place];  Service: General;  Laterality: Right;  . PORTACATH PLACEMENT Left 08/17/2016   Procedure: INSERTION PORT-A-CATH WITH [place];  Surgeon: Rolm Bookbinder, MD;  Location: [place];  Service: General;  Laterality: Left;    There were no vitals filed for this visit.   Subjective Assessment - 09/19/17 1350    Subjective  "I went back to get my breast checked on the left side. Dr. Donne Hazel felt like I still need more therapy on the right side." At timesit feels like it's hard to lift with the right arm.    Pertinent History  Right breast cancer diagnosed; 08/16/16-12/01/16 completed chemotherapy except continued Herceptin until December. 01/03/17 Right mastectomy followed by radiation, completed 03/26/17. Reports neuropathy in hands (right>left) and in feet. Hypothyoid.     Patient Stated Goals  stop the pain    Currently in Pain?  Yes    Pain Score  7  up to 10 at times    Pain Location  Arm    Pain Orientation  Right;Upper    Pain Descriptors / Indicators  Spasm    Aggravating Factors   unsure; cold weather seemsworse    Pain Relieving Factors  moving arm a bit, sleeping on the right side         [facility] PT Assessment - 09/19/17 0001      Assessment   Medical Diagnosis  right breast cancer s/p chemo, mastectomy and radiation  Referring Provider  Dr. Rolm Bookbinder    Onset Date/Surgical Date  01/03/17 mastectomy    Hand Dominance  Right    Prior Therapy  was here last summer      Precautions   Precautions  Other (comment)    Precaution Comments  cancer precautions: avoid modalities to the right upper quadrant other than possibly electrical stimulation or iontophoresis if warranted      Restrictions   Weight Bearing Restrictions  No      Balance Screen   Has the patient fallen in the past 6 months  No    Has the patient had a decrease in activity level because of a fear of falling?   No    Is the patient reluctant to leave their home because of a fear of falling?   No      Home Film/video editor residence    Living Arrangements  Spouse/significant other    Type of [place]  One level      Prior Function   Level of Independence  Independent    Vocation  Full time employment    Vocation Requirements  in a [place]  just walks some every day with the dog, 3 miles total      Cognition   Overall Cognitive Status  Within Functional Limits for tasks assessed says she still has chemo brain      Observation/Other Assessments   Skin Integrity  incision viewed briefly and is well-healed; skin is darkened from radiation    Quick DASH   50      Posture/Postural Control   Posture/Postural Control  Postural limitations    Postural Limitations  Forward head;Rounded Shoulders      ROM / Strength   AROM / PROM / Strength  AROM      AROM   AROM Assessment Site  Shoulder    Right/Left Shoulder  Right;Left    Right Shoulder Flexion  120 Degrees standing    Right Shoulder ABduction  105 Degrees    Right Shoulder Internal Rotation  64 Degrees    Right Shoulder External Rotation  47 Degrees pn and pec pulling    Right Shoulder Horizontal ABduction  8 Degrees    Left Shoulder Flexion  153 Degrees    Left Shoulder ABduction  160 Degrees    Left Shoulder Internal Rotation  68 Degrees    Left Shoulder External Rotation  81 Degrees    Left Shoulder Horizontal ABduction  25 Degrees      Palpation   Palpation comment  no cording felt, though it looked like there might be some with right shoulder abduction, no tenderness subcromial space, very limited soft tissue mobility and mastectomy site      Special Tests    Special Tests  Rotator Cuff Impingement    Rotator Cuff Impingment tests  Michel Bickers test      Hawkins-Kennedy test   Findings  Positive    Side  Right    Comments  mild pain      Ambulation/Gait   Ambulation/Gait  Yes    Ambulation/Gait Assistance  7: Independent        LYMPHEDEMA/ONCOLOGY QUESTIONNAIRE - 09/19/17 1424      Right Upper Extremity Lymphedema   10 cm Proximal to Olecranon Process  29.7 cm     Olecranon Process  26 cm    10 cm  Proximal to Ulnar Styloid Process  21.3 cm    Just Proximal to Ulnar Styloid Process  16.8 cm    Across Hand at [employer]  20.8 cm    At [place] of 2nd Digit  6.4 cm      Left Upper Extremity Lymphedema   10 cm Proximal to Olecranon Process  29 cm    Olecranon Process  26 cm    10 cm Proximal to Ulnar Styloid Process  21 cm    Just Proximal to Ulnar Styloid Process  16.7 cm    Across Hand at [employer]  20.3 cm    At [place] of 2nd Digit  6.4 cm        Quick Dash - 09/19/17 0001    Open a tight or new jar  Moderate difficulty    Do heavy household chores (wash walls, wash floors)  Moderate difficulty    Carry a shopping bag or briefcase  Mild difficulty    Wash your back  Mild difficulty    Use a knife to cut food  Moderate difficulty    Recreational activities in which you take some force or impact through your arm, shoulder, or hand (golf, hammering, tennis)  Severe difficulty    During the past week, to what extent has your arm, shoulder or hand problem interfered with your normal social activities with family, friends, neighbors, or groups?  Modererately    During the past week, to what extent has your arm, shoulder or hand problem limited your work or other regular daily activities  Modererately    Arm, shoulder, or hand pain.  Moderate    Tingling (pins and needles) in your arm, shoulder, or hand  Severe    Difficulty Sleeping  Moderate difficulty    DASH Score  50 %       Objective measurements completed on examination: See above findings.                   PT Long Term Goals - 09/19/17 2020      PT LONG TERM GOAL #1   Title  Patient will be independent in home exercise program for right shoulder ROM, pect stretches, upper back strengthening.    Time  4    Period  Weeks    Status  New      PT LONG TERM GOAL #2   Title  Right shoulder flexion to at least 140 degrees for improved overhead reach.    Baseline  120  at eval compared to 153 on the left    Time  4    Period  Weeks    Status  New      PT LONG TERM GOAL #3   Title  right shoulder abduction to at least 140 degrees for improved ADLs    Baseline  105 at eval compared to 160 on the left    Time  4    Period  Weeks    Status  New      PT LONG TERM GOAL #4   Title  right shoulder horizontal abduction to at least 20 degrees    Baseline  8 degrees at eval compared to 25 on the left    Time  4    Period  Weeks    Status  New      PT LONG TERM GOAL #5   Title  patient will show at least 65 degrees of right shoulder external rotation  for improved ADLs    Baseline  47 at eval compared to 81 on the left    Time  4    Period  Weeks    Status  New      Additional Long Term Goals   Additional Long Term Goals  Yes      PT LONG TERM GOAL #6   Title  Pt. will report at least 50% decrease overall in right shoulder pain (intensity and/or frequency)    Baseline  up to 10/10 at times    Time  4    Period  Weeks    Status  New      PT LONG TERM GOAL #7   Title  Quick DASH score will be reduced to 25 or less    Baseline  50 at eval    Time  4    Period  Weeks    Status  New             Plan - 09/19/17 2005    Clinical Impression Statement  This is a pleasant woman known to this clinic,  having been treated here in June of last year.  She was diagnosed with right breast cancer and underwent mastectomy 01/03/17 following neo-adjuvant chemotherapy.  She completed radiation treatment 03/26/17.  Her therapy with [place] was interrupted by her radiation treatment as it became too difficult to do all of this at once.  At a recent follow-up with her surgeon, he suggested that she would benefit now from continuing therapy.  She presents with forward head, rounded shoulder posture.  Right mastectomy scar looks good, but at its lateral aspect, the scar draws in to the tissue near the axilla.  Soft tissue mobility is significantly limited across the  mastectomy incision/radiation treatment area. Her shoulder active ROM is limited in flexion, abduction, external rotation , and horizontal abduction on the right side. She also has pain in the area that is intermittent, with some pain on internal rotation of the shoulder, but no localized tenderness to palpation at the shoulder joint line. Pain with AROM is at least in part at the right pect muscle. Because she lives and works in [place], she would prefer to get treatment there.    Clinical Presentation  Stable    Clinical Decision Making  Low    Rehab Potential  Good    Clinical Impairments Affecting Rehab Potential  h/o radiation to right chest probably limits the potential for normal soft tissue mobility in that area    PT Frequency  2x / week    PT Duration  4 weeks    PT Treatment/Interventions  ADLs/Self Care Home Management;Therapeutic exercise;Patient/family education;Manual techniques;Scar mobilization;Passive range of motion    PT Next Visit Plan  Instruct in home exercise program for right shoulder ROM; begin soft tissue mobilization and myofascial release to right chest/incision/shoulder area; include passive ROM to right shoulder; pect stretches.  Later, include upper back strengthening and posture re-ed.     Consulted and Agree with Plan of Care  Patient       Patient will benefit from skilled therapeutic intervention in order to improve the following deficits and impairments:  Decreased range of motion, Pain, Decreased scar mobility, Postural dysfunction, Impaired UE functional use  Visit Diagnosis: Stiffness of right shoulder, not elsewhere classified - Plan: PT plan of care cert/re-cert  Abnormal posture - Plan: PT plan of care cert/re-cert  Disorder of the skin and subcutaneous tissue related to radiation, unspecified -  Plan: PT plan of care cert/re-cert  Aftercare following surgery for neoplasm - Plan: PT plan of care cert/re-cert  Right shoulder pain, unspecified  chronicity - Plan: PT plan of care cert/re-cert     Problem List Patient Active Problem List   Diagnosis Date Noted  . Port catheter in place 09/08/2016  . Malignant neoplasm of upper-outer quadrant of right breast in female, estrogen receptor positive ([place]) 08/14/2016  . Breast cancer of upper-outer quadrant of right female breast ([place]) 08/13/2016  . Varicose veins of lower extremities with other complications 50/51/8335  . Hypothyroidism 04/24/2013    SALISBURY,DONNA 09/19/2017, 8:30 PM  [place], [place], [address] Phone: [phone]   Fax:  [phone]  Name: EVERLYNN SAGUN MRN: [id number] Date of Birth: 30-Nov-1955  Serafina Royals, PT 09/19/17 8:30 PM

## 2017-09-20 NOTE — Telephone Encounter (Signed)
Oral Oncology Patient Advocate Encounter  Received confirmation from [place] that the patient received her initial shipment of Nerlynx on 09/17/2017.   Fabio Asa. Melynda Keller, [place] Patient [place] [phone] 09/20/2017 11:34 AM

## 2017-09-24 ENCOUNTER — Other Ambulatory Visit: Payer: Self-pay | Admitting: *Deleted

## 2017-09-24 ENCOUNTER — Telehealth: Payer: Self-pay | Admitting: *Deleted

## 2017-09-24 NOTE — Telephone Encounter (Signed)
This RN spoke with pt per her call stating " I am feeling terrible since starting that pill on Saturday "   " I was reading up on it and I am just feeling awful - just like I want to die "  Note pt was emotional during discussion - with this RN validated her feelings but also verifying above is physically how she is feeling -  She denies any suicidal ideation.  " every side effect I had when I had chemo has returned - my feet burn and hurt, my hands hurt, the medication is tearing my insides up and then I started having diarrhea "  " my heart is racing and pounding and this medication has my nerves all tore up "  Above discussed with pt stating symptoms worse on Sunday post starting the Nerlynx on Saturday - with last diarrhea at 430 am - Betsabe took an imodium.  She was able to eat toast for breakfast and she is drinking well " so I do not think I am dehydrated ".  This RN discussed concern due to onset post initiating and need to hold medication - Shamekia states " I don't feel I can take this medication but I do not want to do something that would not be good for me " Gethsemane verified she is scared if she doesn't take the medication she could get worse per cancer - but taking the medication is " just tearing me up all over ".  Post MD review - pt is to hold medication at this time - and call this office later this week with update.  Medication and possible restart will be further discussed at scheduled visit 10/04/2017.  Above discussed with patient - who stated relief with plan and appreciation.  This note will be forwarded to oral Pharmacist department for communication of call.

## 2017-09-27 ENCOUNTER — Telehealth: Payer: Self-pay

## 2017-09-27 NOTE — Telephone Encounter (Signed)
Returned pt call. She is feeling much better after being off of anastrozole for 4 days. Informed her to continue to stay of off the medication and she will be able to discuss options next Thursday at her appointment with Dr. Lindi Adie.   Cyndia Bent RN

## 2017-10-01 ENCOUNTER — Ambulatory Visit: Payer: BLUE CROSS/BLUE SHIELD | Admitting: Physical Therapy

## 2017-10-01 DIAGNOSIS — L599 Disorder of the skin and subcutaneous tissue related to radiation, unspecified: Secondary | ICD-10-CM

## 2017-10-01 DIAGNOSIS — R293 Abnormal posture: Secondary | ICD-10-CM

## 2017-10-01 DIAGNOSIS — M25611 Stiffness of right shoulder, not elsewhere classified: Secondary | ICD-10-CM | POA: Diagnosis not present

## 2017-10-01 DIAGNOSIS — M25511 Pain in right shoulder: Secondary | ICD-10-CM

## 2017-10-01 DIAGNOSIS — Z483 Aftercare following surgery for neoplasm: Secondary | ICD-10-CM

## 2017-10-01 NOTE — Therapy (Signed)
[facility] [address], [place], [address] Phone: [phone]   Fax:  [phone]  Physical Therapy Treatment  Patient Details  Name: Cynthia Chavez MRN: [id number] Date of Birth: 09/08/1955 Referring Provider: Dr. Rolm Bookbinder   Encounter Date: 10/01/2017  PT End of Session - 10/01/17 1353    Visit Number  2    Number of Visits  9    Date for PT Re-Evaluation  10/26/17    PT Start Time  3762    PT Stop Time  1430    PT Time Calculation (min)  42 min    Activity Tolerance  Patient tolerated treatment well    Behavior During Therapy  [facility] for tasks assessed/performed       Past Medical History:  Diagnosis Date  . Anxiety   . Breast cancer ([place])   . Cancer [facility])    breast cancer  . Depression   . Edema    bilateral feet and leg swelling  . GERD (gastroesophageal reflux disease)   . History of hiatal hernia   . History of radiation therapy 02/12/17- 03/26/17   Chest wall and regional nodes, right- 50 Gy in 25 fractions, Chest Wall, boost, right- 10 Gy in 5 fractions.   . Hypothyroidism   . Malaise   . Thyroid disease   . Varicose veins     Past Surgical History:  Procedure Laterality Date  . ABDOMINAL HYSTERECTOMY    . AXILLARY LYMPH NODE BIOPSY Right 01/03/2017   Procedure: RIGHT AXILLARY LYMPH NODE SEED GUIDED EXCISIONAL BIOPSY;  Surgeon: Rolm Bookbinder, MD;  Location: [place];  Service: General;  Laterality: Right;  . CHOLECYSTECTOMY    . COLONOSCOPY    . ESOPHAGEAL DILATION    . MASTECTOMY Right   . MASTECTOMY W/ SENTINEL NODE BIOPSY Right 01/03/2017   axillary  . MASTECTOMY W/ SENTINEL NODE BIOPSY Right 01/03/2017   Procedure: RIGHT TOTAL MASTECTOMY WITH RIGHT AXILLARY SENTINEL LYMPH NODE BIOPSY;  Surgeon: Rolm Bookbinder, MD;  Location: [place];  Service: General;  Laterality: Right;  . PORTACATH PLACEMENT Left 08/17/2016   Procedure: INSERTION PORT-A-CATH WITH [place];  Surgeon: Rolm Bookbinder, MD;   Location: [place];  Service: General;  Laterality: Left;    There were no vitals filed for this visit.      [facility] PT Assessment - 10/01/17 0001      Assessment   Medical Diagnosis  right breast cancer s/p chemo, mastectomy and radiation    Onset Date/Surgical Date  01/03/17 masectomy    Hand Dominance  Right    Prior Therapy  yes      Precautions   Precautions  Other (comment)    Precaution Comments  cancer precautions: avoid modalities to the right upper quadrant other than possibly electrical stimulation or iontophoresis if warranted      Restrictions   Weight Bearing Restrictions  No                  [facility] Adult PT Treatment/Exercise - 10/01/17 0001      Exercises   Exercises  Shoulder      Shoulder Exercises: Seated   Horizontal ABduction  AROM;Right;20 reps      Shoulder Exercises: Standing   Horizontal ABduction  --    Retraction  AROM;20 reps    Other Standing Exercises  Shoulder rolls 2x10      Manual Therapy   Manual Therapy  Soft tissue mobilization;Passive ROM    Soft  tissue mobilization  STW/M to R axilla    Passive ROM  PROM to improve R flexion, abduction, ER with gentle over pressure                  PT Long Term Goals - 09/19/17 2020      PT LONG TERM GOAL #1   Title  Patient will be independent in home exercise program for right shoulder ROM, pect stretches, upper back strengthening.    Time  4    Period  Weeks    Status  New      PT LONG TERM GOAL #2   Title  Right shoulder flexion to at least 140 degrees for improved overhead reach.    Baseline  120 at eval compared to 153 on the left    Time  4    Period  Weeks    Status  New      PT LONG TERM GOAL #3   Title  right shoulder abduction to at least 140 degrees for improved ADLs    Baseline  105 at eval compared to 160 on the left    Time  4    Period  Weeks    Status  New      PT LONG TERM GOAL #4   Title  right shoulder horizontal abduction  to at least 20 degrees    Baseline  8 degrees at eval compared to 25 on the left    Time  4    Period  Weeks    Status  New      PT LONG TERM GOAL #5   Title  patient will show at least 65 degrees of right shoulder external rotation for improved ADLs    Baseline  47 at eval compared to 81 on the left    Time  4    Period  Weeks    Status  New      Additional Long Term Goals   Additional Long Term Goals  Yes      PT LONG TERM GOAL #6   Title  Pt. will report at least 50% decrease overall in right shoulder pain (intensity and/or frequency)    Baseline  up to 10/10 at times    Time  4    Period  Weeks    Status  New      PT LONG TERM GOAL #7   Title  Quick DASH score will be reduced to 25 or less    Baseline  50 at eval    Time  4    Period  Weeks    Status  New            Plan - 10/01/17 1650    Clinical Impression Statement  Patient was able to tolerate treatment well. Patient required some rest breaks during exercise as UT muscles and posterior shouldre muscles began to fatigue. Patient noted with tight band of tissue in the axilla; STW/M performed on band with PROM shoulder abduction. Patient stated she felt more "freed up" under the axilla.    Clinical Presentation  Stable    Clinical Decision Making  Low    Rehab Potential  Good    Clinical Impairments Affecting Rehab Potential  h/o radiation to right chest probably limits the potential for normal soft tissue mobility in that area    PT Frequency  2x / week    PT Duration  4 weeks    PT Treatment/Interventions  ADLs/Self  Care Home Management;Therapeutic exercise;Patient/family education;Manual techniques;Scar mobilization;Passive range of motion    PT Next Visit Plan  Instruct in home exercise program for right shoulder ROM; begin soft tissue mobilization and myofascial release to right chest/incision/shoulder area; include passive ROM to right shoulder; pect stretches.  Later, include upper back strengthening and  posture re-ed.     Consulted and Agree with Plan of Care  Patient       Patient will benefit from skilled therapeutic intervention in order to improve the following deficits and impairments:  Decreased range of motion, Pain, Decreased scar mobility, Postural dysfunction, Impaired UE functional use  Visit Diagnosis: Stiffness of right shoulder, not elsewhere classified  Abnormal posture  Disorder of the skin and subcutaneous tissue related to radiation, unspecified  Aftercare following surgery for neoplasm  Right shoulder pain, unspecified chronicity     Problem List Patient Active Problem List   Diagnosis Date Noted  . Port catheter in place 09/08/2016  . Malignant neoplasm of upper-outer quadrant of right breast in female, estrogen receptor positive ([place]) 08/14/2016  . Breast cancer of upper-outer quadrant of right female breast ([place]) 08/13/2016  . Varicose veins of lower extremities with other complications 28/36/6294  . Hypothyroidism 04/24/2013   Gabriela Eves, PT, DPT 10/01/2017, 4:55 PM  [facility] [address] [place], [place], [address] Phone: [phone]   Fax:  [phone]  Name: Cynthia Chavez MRN: [id number] Date of Birth: 09/22/1955

## 2017-10-03 ENCOUNTER — Ambulatory Visit: Payer: BLUE CROSS/BLUE SHIELD | Admitting: Physical Therapy

## 2017-10-03 ENCOUNTER — Encounter: Payer: Self-pay | Admitting: Physical Therapy

## 2017-10-03 DIAGNOSIS — M25611 Stiffness of right shoulder, not elsewhere classified: Secondary | ICD-10-CM | POA: Diagnosis not present

## 2017-10-03 DIAGNOSIS — Z483 Aftercare following surgery for neoplasm: Secondary | ICD-10-CM

## 2017-10-03 DIAGNOSIS — R293 Abnormal posture: Secondary | ICD-10-CM

## 2017-10-03 DIAGNOSIS — L599 Disorder of the skin and subcutaneous tissue related to radiation, unspecified: Secondary | ICD-10-CM

## 2017-10-03 DIAGNOSIS — M25511 Pain in right shoulder: Secondary | ICD-10-CM

## 2017-10-03 NOTE — Therapy (Signed)
[facility] [place], [place], [address] Phone: [phone]   Fax:  [phone]  Physical Therapy Treatment  Patient Details  Name: Cynthia Chavez MRN: [id number] Date of Birth: 1956-01-29 Referring Provider: Dr. Rolm Bookbinder   Encounter Date: 10/03/2017  PT End of Session - 10/03/17 1353    Visit Number  3    Number of Visits  9    Date for PT Re-Evaluation  10/26/17    PT Start Time  1350    PT Stop Time  1430    PT Time Calculation (min)  40 min    Activity Tolerance  Patient tolerated treatment well    Behavior During Therapy  [facility] for tasks assessed/performed       Past Medical History:  Diagnosis Date  . Anxiety   . Breast cancer ([place])   . Cancer [facility])    breast cancer  . Depression   . Edema    bilateral feet and leg swelling  . GERD (gastroesophageal reflux disease)   . History of hiatal hernia   . History of radiation therapy 02/12/17- 03/26/17   Chest wall and regional nodes, right- 50 Gy in 25 fractions, Chest Wall, boost, right- 10 Gy in 5 fractions.   . Hypothyroidism   . Malaise   . Thyroid disease   . Varicose veins     Past Surgical History:  Procedure Laterality Date  . ABDOMINAL HYSTERECTOMY    . AXILLARY LYMPH NODE BIOPSY Right 01/03/2017   Procedure: RIGHT AXILLARY LYMPH NODE SEED GUIDED EXCISIONAL BIOPSY;  Surgeon: Rolm Bookbinder, MD;  Location: [place];  Service: General;  Laterality: Right;  . CHOLECYSTECTOMY    . COLONOSCOPY    . ESOPHAGEAL DILATION    . MASTECTOMY Right   . MASTECTOMY W/ SENTINEL NODE BIOPSY Right 01/03/2017   axillary  . MASTECTOMY W/ SENTINEL NODE BIOPSY Right 01/03/2017   Procedure: RIGHT TOTAL MASTECTOMY WITH RIGHT AXILLARY SENTINEL LYMPH NODE BIOPSY;  Surgeon: Rolm Bookbinder, MD;  Location: [place];  Service: General;  Laterality: Right;  . PORTACATH PLACEMENT Left 08/17/2016   Procedure: INSERTION PORT-A-CATH WITH [place];  Surgeon: Rolm Bookbinder, MD;   Location: [place];  Service: General;  Laterality: Left;    There were no vitals filed for this visit.  Subjective Assessment - 10/03/17 1352    Subjective  Reports greatest difficulty in going backwards.    Pertinent History  Right breast cancer diagnosed; 08/16/16-12/01/16 completed chemotherapy except continued Herceptin until December. 01/03/17 Right mastectomy followed by radiation, completed 03/26/17. Reports neuropathy in hands (right>left) and in feet. Hypothyoid.     Patient Stated Goals  stop the pain    Currently in Pain?  Yes    Pain Score  8     Pain Location  Shoulder    Pain Orientation  Right;Upper    Pain Descriptors / Indicators  Sharp    Pain Onset  More than a month ago    Pain Frequency  Intermittent    Aggravating Factors   Certain movements    Pain Relieving Factors  Movement of RUE, sleeping on R side         [facility] PT Assessment - 10/03/17 0001      Assessment   Medical Diagnosis  right breast cancer s/p chemo, mastectomy and radiation    Onset Date/Surgical Date  01/03/17    Hand Dominance  Right    Prior Therapy  yes  Precautions   Precautions  Other (comment)    Precaution Comments  cancer precautions: avoid modalities to the right upper quadrant other than possibly electrical stimulation or iontophoresis if warranted      Restrictions   Weight Bearing Restrictions  No                  [facility] Adult PT Treatment/Exercise - 10/03/17 0001      Shoulder Exercises: Standing   Horizontal ABduction  AROM;Both;20 reps    Flexion  AROM;Both;20 reps    ABduction  AROM;Both;20 reps    Other Standing Exercises  B shoulder scaption x20 reps AROM      Shoulder Exercises: Pulleys   Flexion  Other (comment) x6 min      Shoulder Exercises: ROM/Strengthening   UBE (Upper Arm Bike)  120 RPM x6 min (3 min forward, 3 min backward)      Manual Therapy   Manual Therapy  Soft tissue mobilization    Soft tissue mobilization  STW to R  pectoralis, axilla to reduce muscle tightness and pain                  PT Long Term Goals - 09/19/17 2020      PT LONG TERM GOAL #1   Title  Patient will be independent in home exercise program for right shoulder ROM, pect stretches, upper back strengthening.    Time  4    Period  Weeks    Status  New      PT LONG TERM GOAL #2   Title  Right shoulder flexion to at least 140 degrees for improved overhead reach.    Baseline  120 at eval compared to 153 on the left    Time  4    Period  Weeks    Status  New      PT LONG TERM GOAL #3   Title  right shoulder abduction to at least 140 degrees for improved ADLs    Baseline  105 at eval compared to 160 on the left    Time  4    Period  Weeks    Status  New      PT LONG TERM GOAL #4   Title  right shoulder horizontal abduction to at least 20 degrees    Baseline  8 degrees at eval compared to 25 on the left    Time  4    Period  Weeks    Status  New      PT LONG TERM GOAL #5   Title  patient will show at least 65 degrees of right shoulder external rotation for improved ADLs    Baseline  47 at eval compared to 81 on the left    Time  4    Period  Weeks    Status  New      Additional Long Term Goals   Additional Long Term Goals  Yes      PT LONG TERM GOAL #6   Title  Pt. will report at least 50% decrease overall in right shoulder pain (intensity and/or frequency)    Baseline  up to 10/10 at times    Time  4    Period  Weeks    Status  New      PT LONG TERM GOAL #7   Title  Quick DASH score will be reduced to 25 or less    Baseline  50 at eval    Time  4    Period  Weeks    Status  New            Plan - 10/03/17 1553    Clinical Impression Statement  Patient tolerated today's treatment fairly well as she completed therex well with only reports of discomfort with shoulder extension. Patient able to complete exercises at wall with ball for posture cueing. Patient was sensitive to manual therapy of R  pectoralis and axilla.     Rehab Potential  Good    Clinical Impairments Affecting Rehab Potential  h/o radiation to right chest probably limits the potential for normal soft tissue mobility in that area    PT Frequency  2x / week    PT Duration  4 weeks    PT Treatment/Interventions  ADLs/Self Care Home Management;Therapeutic exercise;Patient/family education;Manual techniques;Scar mobilization;Passive range of motion    PT Next Visit Plan  Instruct in home exercise program for right shoulder ROM; begin soft tissue mobilization and myofascial release to right chest/incision/shoulder area; include passive ROM to right shoulder; pect stretches.  Later, include upper back strengthening and posture re-ed.     Consulted and Agree with Plan of Care  Patient       Patient will benefit from skilled therapeutic intervention in order to improve the following deficits and impairments:  Decreased range of motion, Pain, Decreased scar mobility, Postural dysfunction, Impaired UE functional use  Visit Diagnosis: Stiffness of right shoulder, not elsewhere classified  Abnormal posture  Disorder of the skin and subcutaneous tissue related to radiation, unspecified  Aftercare following surgery for neoplasm  Right shoulder pain, unspecified chronicity     Problem List Patient Active Problem List   Diagnosis Date Noted  . Port catheter in place 09/08/2016  . Malignant neoplasm of upper-outer quadrant of right breast in female, estrogen receptor positive ([place]) 08/14/2016  . Breast cancer of upper-outer quadrant of right female breast ([place]) 08/13/2016  . Varicose veins of lower extremities with other complications 09/47/0962  . Hypothyroidism 04/24/2013    Standley Brooking, PTA 10/03/2017, 4:04 PM  [facility] [address] [place], [place], [address] Phone: [phone]   Fax:  [phone]  Name: Cynthia Chavez MRN: [id number] Date of Birth:  Sep 24, 1955

## 2017-10-04 ENCOUNTER — Inpatient Hospital Stay (HOSPITAL_BASED_OUTPATIENT_CLINIC_OR_DEPARTMENT_OTHER): Payer: BLUE CROSS/BLUE SHIELD | Admitting: Hematology and Oncology

## 2017-10-04 ENCOUNTER — Inpatient Hospital Stay: Payer: BLUE CROSS/BLUE SHIELD | Attending: Hematology and Oncology

## 2017-10-04 DIAGNOSIS — Z923 Personal history of irradiation: Secondary | ICD-10-CM | POA: Insufficient documentation

## 2017-10-04 DIAGNOSIS — C50411 Malignant neoplasm of upper-outer quadrant of right female breast: Secondary | ICD-10-CM

## 2017-10-04 DIAGNOSIS — Z9221 Personal history of antineoplastic chemotherapy: Secondary | ICD-10-CM | POA: Insufficient documentation

## 2017-10-04 DIAGNOSIS — Z79811 Long term (current) use of aromatase inhibitors: Secondary | ICD-10-CM

## 2017-10-04 DIAGNOSIS — Z170 Estrogen receptor positive status [ER+]: Secondary | ICD-10-CM

## 2017-10-04 LAB — CBC WITH DIFFERENTIAL (CANCER CENTER ONLY)
Basophils Absolute: 0 10*3/uL (ref 0.0–0.1)
Basophils Relative: 0 %
EOS ABS: 0.4 10*3/uL (ref 0.0–0.5)
Eosinophils Relative: 6 %
HEMATOCRIT: 38.7 % (ref 34.8–46.6)
HEMOGLOBIN: 12.8 g/dL (ref 11.6–15.9)
LYMPHS ABS: 1.1 10*3/uL (ref 0.9–3.3)
Lymphocytes Relative: 18 %
MCH: 31.7 pg (ref 25.1–34.0)
MCHC: 33.2 g/dL (ref 31.5–36.0)
MCV: 95.6 fL (ref 79.5–101.0)
MONO ABS: 0.4 10*3/uL (ref 0.1–0.9)
MONOS PCT: 7 %
NEUTROS ABS: 4.4 10*3/uL (ref 1.5–6.5)
NEUTROS PCT: 69 %
Platelet Count: 184 10*3/uL (ref 145–400)
RBC: 4.04 MIL/uL (ref 3.70–5.45)
RDW: 12.8 % (ref 11.2–14.5)
WBC Count: 6.3 10*3/uL (ref 3.9–10.3)

## 2017-10-04 LAB — CMP (CANCER CENTER ONLY)
ALK PHOS: 94 U/L (ref 40–150)
ALT: 13 U/L (ref 0–55)
ANION GAP: 9 (ref 3–11)
AST: 10 U/L (ref 5–34)
Albumin: 3.3 g/dL — ABNORMAL LOW (ref 3.5–5.0)
BILIRUBIN TOTAL: 0.3 mg/dL (ref 0.2–1.2)
BUN: 15 mg/dL (ref 7–26)
CALCIUM: 9.1 mg/dL (ref 8.4–10.4)
CO2: 26 mmol/L (ref 22–29)
Chloride: 108 mmol/L (ref 98–109)
Creatinine: 0.8 mg/dL (ref 0.60–1.10)
GFR, Estimated: >60 mL/min (ref >60)
Glucose, Bld: 103 mg/dL (ref 70–140)
POTASSIUM: 3.9 mmol/L (ref 3.5–5.1)
Sodium: 143 mmol/L (ref 136–145)
TOTAL PROTEIN: 6.4 g/dL (ref 6.4–8.3)

## 2017-10-04 NOTE — Progress Notes (Signed)
Patient Care Team: [name], MD as PCP - General (Pediatrics)  DIAGNOSIS:  Encounter Diagnosis  Name Primary?  . Malignant neoplasm of upper-outer quadrant of right breast in female, estrogen receptor positive ([place])     SUMMARY OF ONCOLOGIC HISTORY:   Breast cancer of upper-outer quadrant of right female breast ([place])   07/24/2016 Mammogram    Rt Breast 2 masses at 11-12:30 Position spanning 8 cm; Additional mass at 2 o clock: 1.5 cm; LN with cort thickening 2.7 cm      07/27/2016 Initial Diagnosis    Rt Biopsy 11oclock and 2 o clock: Grade 3 IDC with LVI; LN biopsy: Positive IDC; 11oclock: Er 100%, PR 90%, Ki 67: 90%; Her 2 Neg Heterogeneous; LN: Er 100%, PR: 90%; Ki67: 60%; Her 2 Pos Ratio 2.37; 2oclock: Er 100%, PR 90%; Ki 67: 90%; Her 2 Positive 2.52      08/16/2016 - 12/01/2016 Neo-Adjuvant Chemotherapy    Neoadjuvant chemotherapy with TCH Perjeta (Taxotere d/ced after 2 cycles)      12/06/2016 Breast MRI    Residual right breast 2 o'clock enhancing mass which now measures 1.3 cm in greatest dimension.Residual non measurable scattered foci of heterogeneous enhancement throughout the superior and subareolar right breast, anterior and middle depth. Diminished right axillary lymphadenopathy      01/03/2017 Surgery    Right mastectomy: Multiple IDC with micropapillary features, LVI, grade 3, 1.5, 0.6, 0.5 cm, 12/20 lymph nodes positive for cancer, margins negative, ER 100%, PR 90%, HER-2 positive, Ki-67 60%,ypT1c ypN3 RCB class III; stage IIIB      02/12/2017 - 03/26/2017 Radiation Therapy    Adjuvant radiation therapy      04/06/2017 -  Anti-estrogen oral therapy    Anastrozole 1 mg by mouth daily       CHIEF COMPLIANT: Follow-up on anastrozole therapy, could not tolerated Neratinib  INTERVAL HISTORY: Cynthia Chavez is a 62 year old with above-mentioned history of right breast cancer treated with neoadjuvant chemotherapy followed by mastectomy and radiation.  She is  currently on antiestrogen therapy with anastrozole.  She is tolerating that extremely well.  Because she was HER-2 positive we gave her a prescription for Neratinib which is FDA approved in this situation.  She could not tolerated.  She had GI upset along with vague generalized symptoms of a email of I think they want [place] to call her and let her know that she is getting injection for your eating the other email radiology is feeling unwell.  REVIEW OF SYSTEMS:   Constitutional: Denies fevers, chills or abnormal weight loss Eyes: Denies blurriness of vision Ears, nose, mouth, throat, and face: Denies mucositis or sore throat Respiratory: Denies cough, dyspnea or wheezes Cardiovascular: Denies palpitation, chest discomfort Gastrointestinal:  Denies nausea, heartburn or change in bowel habits Skin: Denies abnormal skin rashes Lymphatics: Denies new lymphadenopathy or easy bruising Neurological:Denies numbness, tingling or new weaknesses Behavioral/Psych: Mood is stable, no new changes  Extremities: No lower extremity edema Breast: Has breast cancer patient will be ACE membranes I would wonder my question is she she is a person who denies any pain or lumps or nodules in either breasts All other systems were reviewed with the patient and are negative.  I have reviewed the past medical history, past surgical history, social history and family history with the patient and they are unchanged from previous note.  ALLERGIES:  is allergic to ivp dye [iodinated diagnostic agents]; other; dilaudid [hydromorphone hcl]; asa [aspirin]; and codeine.  MEDICATIONS:  Current Outpatient Medications  Medication Sig Dispense Refill  . anastrozole (ARIMIDEX) 1 MG tablet Take 1 tablet (1 mg total) by mouth daily. 90 tablet 3  . levothyroxine (SYNTHROID, LEVOTHROID) 150 MCG tablet Take 1 tablet (150 mcg total) by mouth daily before breakfast. 90 tablet 1  . Neratinib Maleate (NERLYNX) 40 MG tablet Take 6 tablets (240  mg total) by mouth daily. Take with food. (Patient not taking: Reported on 09/19/2017) 180 tablet 3   No current facility-administered medications for this visit.     PHYSICAL EXAMINATION: ECOG PERFORMANCE STATUS: 1 - Symptomatic but completely ambulatory  Vitals:   10/04/17 1430  BP: 110/82  Pulse: 92  Resp: 17  Temp: 98.4 F (36.9 C)  SpO2: 99%   Filed Weights   10/04/17 1430  Weight: 175 lb 12.8 oz (79.7 kg)    GENERAL:alert, no distress and comfortable SKIN: skin color, texture, turgor are normal, no rashes or significant lesions EYES: normal, Conjunctiva are pink and non-injected, sclera clear OROPHARYNX:no exudate, no erythema and lips, buccal mucosa, and tongue normal  NECK: supple, thyroid normal size, non-tender, without nodularity LYMPH:  no palpable lymphadenopathy in the cervical, axillary or inguinal LUNGS: clear to auscultation and percussion with normal breathing effort HEART: regular rate & rhythm and no murmurs and no lower extremity edema ABDOMEN:abdomen soft, non-tender and normal bowel sounds MUSCULOSKELETAL:no cyanosis of digits and no clubbing  NEURO: alert & oriented x 3 with fluent speech, no focal motor/sensory deficits EXTREMITIES: No lower extremity edema  LABORATORY DATA:  I have reviewed the data as listed CMP Latest Ref Rng & Units 10/04/2017 09/06/2017 07/20/2017  Glucose 70 - 140 mg/dL 103 93 86  BUN 7 - 26 mg/dL 15 13 12.1  Creatinine 0.60 - 1.10 mg/dL 0.80 0.73 0.6  Sodium 136 - 145 mmol/L 143 142 143  Potassium 3.5 - 5.1 mmol/L 3.9 3.6 3.7  Chloride 98 - 109 mmol/L 108 105 -  CO2 22 - 29 mmol/L '26 28 25  ' Calcium 8.4 - 10.4 mg/dL 9.1 9.4 9.0  Total Protein 6.4 - 8.3 g/dL 6.4 7.1 6.4  Total Bilirubin 0.2 - 1.2 mg/dL 0.3 0.4 0.73  Alkaline Phos 40 - 150 U/L 94 109 86  AST 5 - 34 U/L '10 13 13  ' ALT 0 - 55 U/L '13 12 12    ' Lab Results  Component Value Date   WBC 6.3 10/04/2017   HGB 12.0 07/20/2017   HCT 38.7 10/04/2017   MCV 95.6  10/04/2017   PLT 184 10/04/2017   NEUTROABS 4.4 10/04/2017    ASSESSMENT & PLAN:  Breast cancer of upper-outer quadrant of right female breast ([place]) Rt Biopsy 11oclock and 2 o clock: Grade 3 IDC with LVI; LN biopsy: Positive IDC; 11oclock: Er 100%, PR 90%, Ki 67: 90%; Her 2 Neg Heterogeneous; LN: Er 100%, PR: 90%; Ki67: 60%; Her 2 Pos Ratio 2.37; 2oclock: Er 100%, PR 90%; Ki 67: 90%; Her 2 Positive 2.52 Breast MRI on 08/13/2016: 8 cm span of breast cancer with multiple enlarged axillary lymph nodes. CT CAP: 08/17/16: 4 mm right middle lobe pulmonary nodule unlikely to be metastatic disease Bone scan 08/17/2016: No evidence of metastatic disease  Echocardiogram performed on 05/17/2017 showed a left ventricular ejection fraction of 60-65%.  Treatment summary: 1. Neoadjuvant chemotherapy with [place] Perjeta 6 cyclesgiven1/10/18 to 12/01/16 followed by Herceptin Perjetamaintenance for 1 year  2. Followed by mastectomy with axillary node dissection 01/03/2017 3. Adjuvant radiation therapycompleted 03/26/2017 4. Followed by Anti-estrogen therapy  started 04/06/2017 5. Followed by Neratinib for 1 yearstarted 09/07/2017 -------------------------------------------------------------------------------------------------------------------------------------------------------------------------------------------------------------------------------------- 01/04/2007 Right mastectomy: Multiple IDC with micropapillary features, LVI, grade 3, 1.5, 0.6, 0.5 cm, 12/20 lymph nodes positive for cancer, margins negative, ER 100%, PR 90%, HER-2 positive, Ki-67 60%,ypT1c ypN3 RCB class III; stage IIIB  Adjuvant radiation therapy started 07/09/2018completed 03/26/2017  Current treatment: Neratinib (started 09/01/17) and Anastrozole started 04/06/17  Anastrozole toxicities:Denies any hot flashes or myalgias Patient tells me that she has had increased appetite and has been gaining weight. She wants to join a gym  and lose weight.  Neratinib toxicities: Headaches, diarrhea, fatigue, generalized feeling of ill health. She stopped Neratinib.  She does not want to take it any further.  Return to clinic in 1 year for follow-up    I spent 25 minutes talking to the patient of which more than half was spent in counseling and coordination of care.  No orders of the defined types were placed in this encounter.  The patient has a good understanding of the overall plan. she agrees with it. she will call with any problems that may develop before the next visit here.   Harriette Ohara, MD 10/04/17

## 2017-10-04 NOTE — Assessment & Plan Note (Signed)
Rt Biopsy 11oclock and 2 o clock: Grade 3 IDC with LVI; LN biopsy: Positive IDC; 11oclock: Er 100%, PR 90%, Ki 67: 90%; Her 2 Neg Heterogeneous; LN: Er 100%, PR: 90%; Ki67: 60%; Her 2 Pos Ratio 2.37; 2oclock: Er 100%, PR 90%; Ki 67: 90%; Her 2 Positive 2.52 Breast MRI on 08/13/2016: 8 cm span of breast cancer with multiple enlarged axillary lymph nodes. CT CAP: 08/17/16: 4 mm right middle lobe pulmonary nodule unlikely to be metastatic disease Bone scan 08/17/2016: No evidence of metastatic disease  Echocardiogram performed on 05/17/2017 showed a left ventricular ejection fraction of 60-65%.  Treatment summary: 1. Neoadjuvant chemotherapy with [place] Perjeta 6 cyclesgiven1/10/18 to 12/01/16 followed by Herceptin Perjetamaintenance for 1 year  2. Followed by mastectomy with axillary node dissection 01/03/2017 3. Adjuvant radiation therapycompleted 03/26/2017 4. Followed by Anti-estrogen therapy started 04/06/2017 5. Followed by Neratinib for 1 yearstarted 09/07/2017 -------------------------------------------------------------------------------------------------------------------------------------------------------------------------------------------------------------------------------------- 01/04/2007 Right mastectomy: Multiple IDC with micropapillary features, LVI, grade 3, 1.5, 0.6, 0.5 cm, 12/20 lymph nodes positive for cancer, margins negative, ER 100%, PR 90%, HER-2 positive, Ki-67 60%,ypT1c ypN3 RCB class III; stage IIIB  Adjuvant radiation therapy started 07/09/2018completed 03/26/2017  Current treatment: Neratinib (started 09/01/17) and Anastrozole started 04/06/17  Anastrozole toxicities:Denies any hot flashes or myalgias Patient tells me that she has had increased appetite and has been gaining weight. She wants to join a gym and lose weight.  Neratinib toxicities:  Return to clinic in 3 months for follow-up

## 2017-10-05 ENCOUNTER — Telehealth: Payer: Self-pay | Admitting: Hematology and Oncology

## 2017-10-05 ENCOUNTER — Telehealth: Payer: Self-pay | Admitting: Pharmacy Technician

## 2017-10-05 NOTE — Telephone Encounter (Signed)
Mailed patient calendar of upcoming February appointments.

## 2017-10-05 NOTE — Telephone Encounter (Signed)
No note - error

## 2017-10-09 ENCOUNTER — Other Ambulatory Visit: Payer: Self-pay | Admitting: *Deleted

## 2017-10-09 ENCOUNTER — Ambulatory Visit: Payer: BLUE CROSS/BLUE SHIELD | Attending: General Surgery | Admitting: Physical Therapy

## 2017-10-09 DIAGNOSIS — R293 Abnormal posture: Secondary | ICD-10-CM | POA: Insufficient documentation

## 2017-10-09 DIAGNOSIS — R600 Localized edema: Secondary | ICD-10-CM | POA: Insufficient documentation

## 2017-10-09 DIAGNOSIS — L599 Disorder of the skin and subcutaneous tissue related to radiation, unspecified: Secondary | ICD-10-CM | POA: Insufficient documentation

## 2017-10-09 DIAGNOSIS — M25611 Stiffness of right shoulder, not elsewhere classified: Secondary | ICD-10-CM | POA: Diagnosis not present

## 2017-10-09 DIAGNOSIS — M6281 Muscle weakness (generalized): Secondary | ICD-10-CM | POA: Insufficient documentation

## 2017-10-09 DIAGNOSIS — M25511 Pain in right shoulder: Secondary | ICD-10-CM | POA: Insufficient documentation

## 2017-10-09 DIAGNOSIS — E039 Hypothyroidism, unspecified: Secondary | ICD-10-CM

## 2017-10-09 DIAGNOSIS — Z483 Aftercare following surgery for neoplasm: Secondary | ICD-10-CM | POA: Diagnosis present

## 2017-10-09 NOTE — Therapy (Signed)
[facility] [place], [place], [address] Phone: [phone]   Fax:  [phone]  Physical Therapy Treatment  Patient Details  Name: Cynthia Chavez MRN: [id number] Date of Birth: Mar 18, 1956 Referring Provider: Dr. Rolm Bookbinder   Encounter Date: 10/09/2017  PT End of Session - 10/09/17 1356    Visit Number  4    Number of Visits  9    Date for PT Re-Evaluation  10/26/17    PT Start Time  6644    PT Stop Time  1430    PT Time Calculation (min)  42 min    Activity Tolerance  Patient tolerated treatment well    Behavior During Therapy  [facility] for tasks assessed/performed       Past Medical History:  Diagnosis Date  . Anxiety   . Breast cancer ([place])   . Cancer [facility])    breast cancer  . Depression   . Edema    bilateral feet and leg swelling  . GERD (gastroesophageal reflux disease)   . History of hiatal hernia   . History of radiation therapy 02/12/17- 03/26/17   Chest wall and regional nodes, right- 50 Gy in 25 fractions, Chest Wall, boost, right- 10 Gy in 5 fractions.   . Hypothyroidism   . Malaise   . Thyroid disease   . Varicose veins     Past Surgical History:  Procedure Laterality Date  . ABDOMINAL HYSTERECTOMY    . AXILLARY LYMPH NODE BIOPSY Right 01/03/2017   Procedure: RIGHT AXILLARY LYMPH NODE SEED GUIDED EXCISIONAL BIOPSY;  Surgeon: Rolm Bookbinder, MD;  Location: [place];  Service: General;  Laterality: Right;  . CHOLECYSTECTOMY    . COLONOSCOPY    . ESOPHAGEAL DILATION    . MASTECTOMY Right   . MASTECTOMY W/ SENTINEL NODE BIOPSY Right 01/03/2017   axillary  . MASTECTOMY W/ SENTINEL NODE BIOPSY Right 01/03/2017   Procedure: RIGHT TOTAL MASTECTOMY WITH RIGHT AXILLARY SENTINEL LYMPH NODE BIOPSY;  Surgeon: Rolm Bookbinder, MD;  Location: [place];  Service: General;  Laterality: Right;  . PORTACATH PLACEMENT Left 08/17/2016   Procedure: INSERTION PORT-A-CATH WITH [place];  Surgeon: Rolm Bookbinder, MD;   Location: [place];  Service: General;  Laterality: Left;    There were no vitals filed for this visit.  Subjective Assessment - 10/09/17 1807    Subjective  Patient reported last session was tough but felt it has made improvement with R shoulder mobility.     Pertinent History  Right breast cancer diagnosed; 08/16/16-12/01/16 completed chemotherapy except continued Herceptin until December. 01/03/17 Right mastectomy followed by radiation, completed 03/26/17. Reports neuropathy in hands (right>left) and in feet. Hypothyoid.     Patient Stated Goals  stop the pain    Pain Score  7     Pain Location  Shoulder    Pain Orientation  Right;Upper    Pain Onset  More than a month ago                      [facility] Adult PT Treatment/Exercise - 10/09/17 0001      Exercises   Exercises  Shoulder      Shoulder Exercises: Standing   Extension  AROM;Both;15 reps with Cane    Other Standing Exercises  UE ranger clockwise, counter clockwise 20x each    Other Standing Exercises  finger ladder x10 #27/28      Shoulder Exercises: Pulleys   Flexion  Other (comment) 4 minutes  Manual Therapy   Manual Therapy  Soft tissue mobilization    Soft tissue mobilization  STW to R pectoralis, posterior shoulder axilla to reduce muscle tightness and pain    Passive ROM  PROM to improve R flexion, abduction, ER with gentle over pressure                  PT Long Term Goals - 09/19/17 2020      PT LONG TERM GOAL #1   Title  Patient will be independent in home exercise program for right shoulder ROM, pect stretches, upper back strengthening.    Time  4    Period  Weeks    Status  New      PT LONG TERM GOAL #2   Title  Right shoulder flexion to at least 140 degrees for improved overhead reach.    Baseline  120 at eval compared to 153 on the left    Time  4    Period  Weeks    Status  New      PT LONG TERM GOAL #3   Title  right shoulder abduction to at least 140  degrees for improved ADLs    Baseline  105 at eval compared to 160 on the left    Time  4    Period  Weeks    Status  New      PT LONG TERM GOAL #4   Title  right shoulder horizontal abduction to at least 20 degrees    Baseline  8 degrees at eval compared to 25 on the left    Time  4    Period  Weeks    Status  New      PT LONG TERM GOAL #5   Title  patient will show at least 65 degrees of right shoulder external rotation for improved ADLs    Baseline  47 at eval compared to 81 on the left    Time  4    Period  Weeks    Status  New      Additional Long Term Goals   Additional Long Term Goals  Yes      PT LONG TERM GOAL #6   Title  Pt. will report at least 50% decrease overall in right shoulder pain (intensity and/or frequency)    Baseline  up to 10/10 at times    Time  4    Period  Weeks    Status  New      PT LONG TERM GOAL #7   Title  Quick DASH score will be reduced to 25 or less    Baseline  50 at eval    Time  4    Period  Weeks    Status  New            Plan - 10/09/17 1809    Clinical Impression Statement  Patient was able to tolerate treatment well with some discomfort with R shoulder flexion. Patient noted with improvement of shoulder and abduction PROM to roughly 90 degrees. Continue with PROM and AROM exercises to improve range of motion and pain.    Clinical Presentation  Stable    Clinical Decision Making  Low    Rehab Potential  Good    Clinical Impairments Affecting Rehab Potential  h/o radiation to right chest probably limits the potential for normal soft tissue mobility in that area    PT Frequency  2x / week    PT  Duration  4 weeks    PT Treatment/Interventions  ADLs/Self Care Home Management;Therapeutic exercise;Patient/family education;Manual techniques;Scar mobilization;Passive range of motion    PT Next Visit Plan  Instruct in home exercise program for right shoulder ROM; begin soft tissue mobilization and myofascial release to right  chest/incision/shoulder area; include passive ROM to right shoulder; pect stretches.  Later, include upper back strengthening and posture re-ed.     Consulted and Agree with Plan of Care  Patient       Patient will benefit from skilled therapeutic intervention in order to improve the following deficits and impairments:  Decreased range of motion, Pain, Decreased scar mobility, Postural dysfunction, Impaired UE functional use  Visit Diagnosis: Stiffness of right shoulder, not elsewhere classified  Abnormal posture  Disorder of the skin and subcutaneous tissue related to radiation, unspecified  Aftercare following surgery for neoplasm  Right shoulder pain, unspecified chronicity  Acute pain of right shoulder     Problem List Patient Active Problem List   Diagnosis Date Noted  . Port catheter in place 09/08/2016  . Malignant neoplasm of upper-outer quadrant of right breast in female, estrogen receptor positive ([place]) 08/14/2016  . Breast cancer of upper-outer quadrant of right female breast ([place]) 08/13/2016  . Varicose veins of lower extremities with other complications 25/18/9842  . Hypothyroidism 04/24/2013   Gabriela Eves, PT, DPT 10/09/2017, 6:12 PM  [facility] [address] [place], [place], [address] Phone: [phone]   Fax:  [phone]  Name: SHIGEKO MANARD MRN: [id number] Date of Birth: 08-08-1955

## 2017-10-10 ENCOUNTER — Ambulatory Visit: Payer: BLUE CROSS/BLUE SHIELD | Admitting: Physical Therapy

## 2017-10-10 DIAGNOSIS — M25611 Stiffness of right shoulder, not elsewhere classified: Secondary | ICD-10-CM | POA: Diagnosis not present

## 2017-10-10 DIAGNOSIS — M6281 Muscle weakness (generalized): Secondary | ICD-10-CM

## 2017-10-10 DIAGNOSIS — Z483 Aftercare following surgery for neoplasm: Secondary | ICD-10-CM

## 2017-10-10 DIAGNOSIS — R293 Abnormal posture: Secondary | ICD-10-CM

## 2017-10-10 DIAGNOSIS — L599 Disorder of the skin and subcutaneous tissue related to radiation, unspecified: Secondary | ICD-10-CM

## 2017-10-10 DIAGNOSIS — R600 Localized edema: Secondary | ICD-10-CM

## 2017-10-10 DIAGNOSIS — M25511 Pain in right shoulder: Secondary | ICD-10-CM

## 2017-10-10 MED ORDER — LEVOTHYROXINE SODIUM 150 MCG PO TABS: 137.0000 ug | ORAL_TABLET | Freq: Every day | ORAL | 1 refills | Status: DC

## 2017-10-10 NOTE — Therapy (Signed)
[facility] [place], [place], [address] Phone: [phone]   Fax:  [phone]  Physical Therapy Treatment  Patient Details  Name: Cynthia Chavez MRN: [id number] Date of Birth: [birth year] Referring Provider: Dr. Rolm Bookbinder   Encounter Date: 10/10/2017  PT End of Session - 10/10/17 1440    Visit Number  5    Number of Visits  9    Date for PT Re-Evaluation  10/26/17    PT Start Time  2951    PT Stop Time  1533    PT Time Calculation (min)  48 min    Activity Tolerance  Patient tolerated treatment well    Behavior During Therapy  [facility] for tasks assessed/performed       Past Medical History:  Diagnosis Date  . Anxiety   . Breast cancer ([place])   . Cancer [facility])    breast cancer  . Depression   . Edema    bilateral feet and leg swelling  . GERD (gastroesophageal reflux disease)   . History of hiatal hernia   . History of radiation therapy 02/12/17- 03/26/17   Chest wall and regional nodes, right- 50 Gy in 25 fractions, Chest Wall, boost, right- 10 Gy in 5 fractions.   . Hypothyroidism   . Malaise   . Thyroid disease   . Varicose veins     Past Surgical History:  Procedure Laterality Date  . ABDOMINAL HYSTERECTOMY    . AXILLARY LYMPH NODE BIOPSY Right 01/03/2017   Procedure: RIGHT AXILLARY LYMPH NODE SEED GUIDED EXCISIONAL BIOPSY;  Surgeon: Rolm Bookbinder, MD;  Location: [place];  Service: General;  Laterality: Right;  . CHOLECYSTECTOMY    . COLONOSCOPY    . ESOPHAGEAL DILATION    . MASTECTOMY Right   . MASTECTOMY W/ SENTINEL NODE BIOPSY Right 01/03/2017   axillary  . MASTECTOMY W/ SENTINEL NODE BIOPSY Right 01/03/2017   Procedure: RIGHT TOTAL MASTECTOMY WITH RIGHT AXILLARY SENTINEL LYMPH NODE BIOPSY;  Surgeon: Rolm Bookbinder, MD;  Location: [place];  Service: General;  Laterality: Right;  . PORTACATH PLACEMENT Left 08/17/2016   Procedure: INSERTION PORT-A-CATH WITH [place];  Surgeon: Rolm Bookbinder, MD;   Location: [place];  Service: General;  Laterality: Left;    There were no vitals filed for this visit.  Subjective Assessment - 10/10/17 1441    Subjective  Patient reported right shoulder soress and feels like her shoulder spasms occasionally.     Currently in Pain?  Yes    Pain Score  5     Pain Location  Shoulder    Pain Orientation  Right;Upper    Pain Descriptors / Indicators  Sharp    Pain Onset  More than a month ago         [facility] PT Assessment - 10/10/17 0001      Assessment   Medical Diagnosis  right breast cancer s/p chemo, mastectomy and radiation    Onset Date/Surgical Date  01/03/17    Hand Dominance  Right    Prior Therapy  yes      Precautions   Precautions  Other (comment)    Precaution Comments  cancer precautions: avoid modalities to the right upper quadrant other than possibly electrical stimulation or iontophoresis if warranted      Restrictions   Weight Bearing Restrictions  No      ROM / Strength   AROM / PROM / Strength  AROM      AROM  AROM Assessment Site  Shoulder    Right/Left Shoulder  Right    Right Shoulder Flexion  155 Degrees    Right Shoulder ABduction  105 Degrees    Right Shoulder Internal Rotation  70 Degrees    Right Shoulder External Rotation  45 Degrees                  [facility] Adult PT Treatment/Exercise - 10/10/17 0001      Exercises   Exercises  Shoulder      Shoulder Exercises: Standing   Horizontal ABduction  AROM;Both;20 reps    Flexion  AROM;20 reps scaption with cane    Other Standing Exercises  UE ranger clockwise, counter clockwise 20x each      Shoulder Exercises: Pulleys   Flexion  Other (comment) 4 minutes      Shoulder Exercises: ROM/Strengthening   UBE (Upper Arm Bike)  120 RPM x6 min (3 min forward, 3 min backward)      Manual Therapy   Manual Therapy  Soft tissue mobilization;Joint mobilization    Joint Mobilization  Grade III shoulder posterior mobilization and grade III  distraction to improve joint mobility and ROM    Soft tissue mobilization  STW to R pectoralis, posterior shoulder axilla to reduce muscle tightness and pain    Passive ROM  PROM to improve R flexion, abduction, ER with gentle over pressure                  PT Long Term Goals - 09/19/17 2020      PT LONG TERM GOAL #1   Title  Patient will be independent in home exercise program for right shoulder ROM, pect stretches, upper back strengthening.    Time  4    Period  Weeks    Status  New      PT LONG TERM GOAL #2   Title  Right shoulder flexion to at least 140 degrees for improved overhead reach.    Baseline  120 at eval compared to 153 on the left    Time  4    Period  Weeks    Status  New      PT LONG TERM GOAL #3   Title  right shoulder abduction to at least 140 degrees for improved ADLs    Baseline  105 at eval compared to 160 on the left    Time  4    Period  Weeks    Status  New      PT LONG TERM GOAL #4   Title  right shoulder horizontal abduction to at least 20 degrees    Baseline  8 degrees at eval compared to 25 on the left    Time  4    Period  Weeks    Status  New      PT LONG TERM GOAL #5   Title  patient will show at least 65 degrees of right shoulder external rotation for improved ADLs    Baseline  47 at eval compared to 81 on the left    Time  4    Period  Weeks    Status  New      Additional Long Term Goals   Additional Long Term Goals  Yes      PT LONG TERM GOAL #6   Title  Pt. will report at least 50% decrease overall in right shoulder pain (intensity and/or frequency)    Baseline  up to 10/10 at  times    Time  4    Period  Weeks    Status  New      PT LONG TERM GOAL #7   Title  Quick DASH score will be reduced to 25 or less    Baseline  50 at eval    Time  4    Period  Weeks    Status  New            Plan - 10/10/17 1706    Clinical Impression Statement  Patient was able to complete treatment with some reports of soreness  and discomfort. Patient has made improvements in AROM although she still has soreness and "pulling" when she moves her arm toward end range.    Clinical Presentation  Stable    Clinical Decision Making  Low    Rehab Potential  Good    Clinical Impairments Affecting Rehab Potential  h/o radiation to right chest probably limits the potential for normal soft tissue mobility in that area    PT Frequency  2x / week    PT Duration  4 weeks    PT Treatment/Interventions  ADLs/Self Care Home Management;Therapeutic exercise;Patient/family education;Manual techniques;Scar mobilization;Passive range of motion    PT Next Visit Plan  Instruct in home exercise program for right shoulder ROM; begin soft tissue mobilization and myofascial release to right chest/incision/shoulder area; include passive ROM to right shoulder; pect stretches.  Later, include upper back strengthening and posture re-ed.     Consulted and Agree with Plan of Care  Patient       Patient will benefit from skilled therapeutic intervention in order to improve the following deficits and impairments:  Decreased range of motion, Pain, Decreased scar mobility, Postural dysfunction, Impaired UE functional use  Visit Diagnosis: Stiffness of right shoulder, not elsewhere classified  Abnormal posture  Disorder of the skin and subcutaneous tissue related to radiation, unspecified  Aftercare following surgery for neoplasm  Right shoulder pain, unspecified chronicity  Acute pain of right shoulder  Muscle weakness (generalized)  Localized edema     Problem List Patient Active Problem List   Diagnosis Date Noted  . Port catheter in place 09/08/2016  . Malignant neoplasm of upper-outer quadrant of right breast in female, estrogen receptor positive ([place]) 08/14/2016  . Breast cancer of upper-outer quadrant of right female breast ([place]) 08/13/2016  . Varicose veins of lower extremities with other complications 84/53/6468  .  Hypothyroidism 04/24/2013   Gabriela Eves, PT, DPT 10/10/2017, 5:12 PM  [facility] [address] [place], [place], [address] Phone: [phone]   Fax:  [phone]  Name: Cynthia Chavez MRN: [id number] Date of Birth: [birth year]

## 2017-10-12 ENCOUNTER — Encounter: Payer: BLUE CROSS/BLUE SHIELD | Admitting: Physical Therapy

## 2017-10-15 ENCOUNTER — Ambulatory Visit: Payer: BLUE CROSS/BLUE SHIELD | Admitting: Physical Therapy

## 2017-10-15 DIAGNOSIS — M25511 Pain in right shoulder: Secondary | ICD-10-CM

## 2017-10-15 DIAGNOSIS — R600 Localized edema: Secondary | ICD-10-CM

## 2017-10-15 DIAGNOSIS — Z483 Aftercare following surgery for neoplasm: Secondary | ICD-10-CM

## 2017-10-15 DIAGNOSIS — M6281 Muscle weakness (generalized): Secondary | ICD-10-CM

## 2017-10-15 DIAGNOSIS — R293 Abnormal posture: Secondary | ICD-10-CM

## 2017-10-15 DIAGNOSIS — L599 Disorder of the skin and subcutaneous tissue related to radiation, unspecified: Secondary | ICD-10-CM

## 2017-10-15 DIAGNOSIS — M25611 Stiffness of right shoulder, not elsewhere classified: Secondary | ICD-10-CM | POA: Diagnosis not present

## 2017-10-15 NOTE — Therapy (Signed)
[facility] [place], [place], [address] Phone: [phone]   Fax:  [phone]  Physical Therapy Treatment  Patient Details  Name: Cynthia Chavez MRN: [id number] Date of Birth: 1956/06/16 Referring Provider: Dr. Rolm Bookbinder   Encounter Date: 10/15/2017  PT End of Session - 10/15/17 1041    Visit Number  6    Number of Visits  9    Date for PT Re-Evaluation  10/26/17    PT Start Time  1030    PT Stop Time  1117    PT Time Calculation (min)  47 min    Activity Tolerance  Patient tolerated treatment well    Behavior During Therapy  [facility] for tasks assessed/performed       Past Medical History:  Diagnosis Date  . Anxiety   . Breast cancer ([place])   . Cancer [facility])    breast cancer  . Depression   . Edema    bilateral feet and leg swelling  . GERD (gastroesophageal reflux disease)   . History of hiatal hernia   . History of radiation therapy 02/12/17- 03/26/17   Chest wall and regional nodes, right- 50 Gy in 25 fractions, Chest Wall, boost, right- 10 Gy in 5 fractions.   . Hypothyroidism   . Malaise   . Thyroid disease   . Varicose veins     Past Surgical History:  Procedure Laterality Date  . ABDOMINAL HYSTERECTOMY    . AXILLARY LYMPH NODE BIOPSY Right 01/03/2017   Procedure: RIGHT AXILLARY LYMPH NODE SEED GUIDED EXCISIONAL BIOPSY;  Surgeon: Rolm Bookbinder, MD;  Location: [place];  Service: General;  Laterality: Right;  . CHOLECYSTECTOMY    . COLONOSCOPY    . ESOPHAGEAL DILATION    . MASTECTOMY Right   . MASTECTOMY W/ SENTINEL NODE BIOPSY Right 01/03/2017   axillary  . MASTECTOMY W/ SENTINEL NODE BIOPSY Right 01/03/2017   Procedure: RIGHT TOTAL MASTECTOMY WITH RIGHT AXILLARY SENTINEL LYMPH NODE BIOPSY;  Surgeon: Rolm Bookbinder, MD;  Location: [facility];  Service: General;  Laterality: Right;  . PORTACATH PLACEMENT Left 08/17/2016   Procedure: INSERTION PORT-A-CATH WITH [place];  Surgeon: Rolm Bookbinder, MD;   Location: [place];  Service: General;  Laterality: Left;    There were no vitals filed for this visit.  Subjective Assessment - 10/15/17 1041    Subjective  Patient reported increased soreness and swelling in R arm over the weekend. Patient stated she needed to brace her arm against her body to decrease the soreness. Patient currently is not having any pain, just pain with movement.    Pertinent History  Right breast cancer diagnosed; 08/16/16-12/01/16 completed chemotherapy except continued Herceptin until December. 01/03/17 Right mastectomy followed by radiation, completed 03/26/17. Reports neuropathy in hands (right>left) and in feet. Hypothyoid.     Patient Stated Goals  stop the pain    Currently in Pain?  No/denies currently only with movement    Pain Orientation  Right;Upper    Pain Descriptors / Indicators  Sore    Pain Onset  More than a month ago         [facility] PT Assessment - 10/15/17 0001      Assessment   Medical Diagnosis  right breast cancer s/p chemo, mastectomy and radiation    Onset Date/Surgical Date  01/03/17    Hand Dominance  Right    Prior Therapy  yes      Precautions   Precautions  Other (comment)  Precaution Comments  cancer precautions: avoid modalities to the right upper quadrant other than possibly electrical stimulation or iontophoresis if warranted      Restrictions   Weight Bearing Restrictions  No                  [facility] Adult PT Treatment/Exercise - 10/15/17 0001      Exercises   Exercises  Shoulder      Shoulder Exercises: Standing   Horizontal ABduction  AROM;Both;20 reps    Flexion  AROM;20 reps scaption plane      Shoulder Exercises: Pulleys   Flexion  Other (comment) 4 minutes      Shoulder Exercises: ROM/Strengthening   UBE (Upper Arm Bike)  120 RPM x6 min (3 min forward, 3 min backward)      Shoulder Exercises: Stretch   Corner Stretch  4 reps 15 seconds      Manual Therapy   Manual Therapy  Soft  tissue mobilization    Joint Mobilization  Grade III shoulder posterior mobilization and grade III distraction to improve joint mobility and ROM    Soft tissue mobilization  STW to R pectoralis, posterior shoulder axilla to reduce muscle tightness and pain    Passive ROM  PROM to improve R flexion, abduction, ER with gentle over pressure                  PT Long Term Goals - 09/19/17 2020      PT LONG TERM GOAL #1   Title  Patient will be independent in home exercise program for right shoulder ROM, pect stretches, upper back strengthening.    Time  4    Period  Weeks    Status  New      PT LONG TERM GOAL #2   Title  Right shoulder flexion to at least 140 degrees for improved overhead reach.    Baseline  120 at eval compared to 153 on the left    Time  4    Period  Weeks    Status  New      PT LONG TERM GOAL #3   Title  right shoulder abduction to at least 140 degrees for improved ADLs    Baseline  105 at eval compared to 160 on the left    Time  4    Period  Weeks    Status  New      PT LONG TERM GOAL #4   Title  right shoulder horizontal abduction to at least 20 degrees    Baseline  8 degrees at eval compared to 25 on the left    Time  4    Period  Weeks    Status  New      PT LONG TERM GOAL #5   Title  patient will show at least 65 degrees of right shoulder external rotation for improved ADLs    Baseline  47 at eval compared to 81 on the left    Time  4    Period  Weeks    Status  New      Additional Long Term Goals   Additional Long Term Goals  Yes      PT LONG TERM GOAL #6   Title  Pt. will report at least 50% decrease overall in right shoulder pain (intensity and/or frequency)    Baseline  up to 10/10 at times    Time  4    Period  Weeks  Status  New      PT LONG TERM GOAL #7   Title  Quick DASH score will be reduced to 25 or less    Baseline  50 at eval    Time  4    Period  Weeks    Status  New            Plan - 10/15/17 1124     Clinical Impression Statement  Patient continues to make improvements in AROM despite "pulling" sensation in R pectorals and R triceps with abduction and flexion, respectively. Patient continues to perform HEP and feels HEP has been helping with her progress. Patient instructed to call doctor to discuss swelling in arm and hand. Patient reported agreement.    Clinical Presentation  Stable    Clinical Decision Making  Low    Rehab Potential  Good    Clinical Impairments Affecting Rehab Potential  h/o radiation to right chest probably limits the potential for normal soft tissue mobility in that area    PT Frequency  2x / week    PT Duration  4 weeks    PT Treatment/Interventions  ADLs/Self Care Home Management;Therapeutic exercise;Patient/family education;Manual techniques;Scar mobilization;Passive range of motion    PT Next Visit Plan  Instruct in home exercise program for right shoulder ROM; begin soft tissue mobilization and myofascial release to right chest/incision/shoulder area; include passive ROM to right shoulder; pect stretches.  Later, include upper back strengthening and posture re-ed.     Consulted and Agree with Plan of Care  Patient       Patient will benefit from skilled therapeutic intervention in order to improve the following deficits and impairments:  Decreased range of motion, Pain, Decreased scar mobility, Postural dysfunction, Impaired UE functional use  Visit Diagnosis: Stiffness of right shoulder, not elsewhere classified  Abnormal posture  Disorder of the skin and subcutaneous tissue related to radiation, unspecified  Aftercare following surgery for neoplasm  Right shoulder pain, unspecified chronicity  Acute pain of right shoulder  Muscle weakness (generalized)  Localized edema     Problem List Patient Active Problem List   Diagnosis Date Noted  . Port catheter in place 09/08/2016  . Malignant neoplasm of upper-outer quadrant of right breast in female,  estrogen receptor positive ([place]) 08/14/2016  . Breast cancer of upper-outer quadrant of right female breast ([place]) 08/13/2016  . Varicose veins of lower extremities with other complications 21/97/5883  . Hypothyroidism 04/24/2013    Gabriela Eves 10/15/2017, 11:28 AM  [facility] [address] [place], [place], [address] Phone: [phone]   Fax:  [phone]  Name: CARROLL RANNEY MRN: [id number] Date of Birth: June 07, 1956

## 2017-10-17 ENCOUNTER — Ambulatory Visit: Payer: BLUE CROSS/BLUE SHIELD | Admitting: Physical Therapy

## 2017-10-17 DIAGNOSIS — L599 Disorder of the skin and subcutaneous tissue related to radiation, unspecified: Secondary | ICD-10-CM

## 2017-10-17 DIAGNOSIS — M6281 Muscle weakness (generalized): Secondary | ICD-10-CM

## 2017-10-17 DIAGNOSIS — M25511 Pain in right shoulder: Secondary | ICD-10-CM

## 2017-10-17 DIAGNOSIS — R293 Abnormal posture: Secondary | ICD-10-CM

## 2017-10-17 DIAGNOSIS — M25611 Stiffness of right shoulder, not elsewhere classified: Secondary | ICD-10-CM | POA: Diagnosis not present

## 2017-10-17 DIAGNOSIS — Z483 Aftercare following surgery for neoplasm: Secondary | ICD-10-CM

## 2017-10-17 DIAGNOSIS — R600 Localized edema: Secondary | ICD-10-CM

## 2017-10-17 NOTE — Therapy (Signed)
[facility] [place], [place], [address] Phone: [phone]   Fax:  [phone]  Physical Therapy Treatment  Patient Details  Name: Cynthia Chavez MRN: [id number] Date of Birth: May 01, 1956 Referring Provider: Dr. Rolm Bookbinder   Encounter Date: 10/17/2017  PT End of Session - 10/17/17 1354    Visit Number  7    Number of Visits  9    Date for PT Re-Evaluation  10/26/17    PT Start Time  8182    PT Stop Time  1437    PT Time Calculation (min)  52 min    Activity Tolerance  Patient tolerated treatment well    Behavior During Therapy  [facility] for tasks assessed/performed       Past Medical History:  Diagnosis Date  . Anxiety   . Breast cancer ([place])   . Cancer [facility])    breast cancer  . Depression   . Edema    bilateral feet and leg swelling  . GERD (gastroesophageal reflux disease)   . History of hiatal hernia   . History of radiation therapy 02/12/17- 03/26/17   Chest wall and regional nodes, right- 50 Gy in 25 fractions, Chest Wall, boost, right- 10 Gy in 5 fractions.   . Hypothyroidism   . Malaise   . Thyroid disease   . Varicose veins     Past Surgical History:  Procedure Laterality Date  . ABDOMINAL HYSTERECTOMY    . AXILLARY LYMPH NODE BIOPSY Right 01/03/2017   Procedure: RIGHT AXILLARY LYMPH NODE SEED GUIDED EXCISIONAL BIOPSY;  Surgeon: Rolm Bookbinder, MD;  Location: [facility];  Service: General;  Laterality: Right;  . CHOLECYSTECTOMY    . COLONOSCOPY    . ESOPHAGEAL DILATION    . MASTECTOMY Right   . MASTECTOMY W/ SENTINEL NODE BIOPSY Right 01/03/2017   axillary  . MASTECTOMY W/ SENTINEL NODE BIOPSY Right 01/03/2017   Procedure: RIGHT TOTAL MASTECTOMY WITH RIGHT AXILLARY SENTINEL LYMPH NODE BIOPSY;  Surgeon: Rolm Bookbinder, MD;  Location: [place];  Service: General;  Laterality: Right;  . PORTACATH PLACEMENT Left 08/17/2016   Procedure: INSERTION PORT-A-CATH WITH [place];  Surgeon: Rolm Bookbinder, MD;   Location: [facility];  Service: General;  Laterality: Left;    There were no vitals filed for this visit.  Subjective Assessment - 10/17/17 1648    Subjective  Patient reports the shoulder is doing okay. She stated she is still experiencing that soreness and grabbing sensation that requires her to brace her arm against her body.    Pertinent History  Right breast cancer diagnosed; 08/16/16-12/01/16 completed chemotherapy except continued Herceptin until December. 01/03/17 Right mastectomy followed by radiation, completed 03/26/17. Reports neuropathy in hands (right>left) and in feet. Hypothyoid.     Patient Stated Goals  stop the pain    Currently in Pain?  No/denies         [facility] PT Assessment - 10/17/17 0001      Assessment   Medical Diagnosis  right breast cancer s/p chemo, mastectomy and radiation      Precautions   Precautions  Other (comment)    Precaution Comments  cancer precautions: avoid modalities to the right upper quadrant other than possibly electrical stimulation or iontophoresis if warranted      Restrictions   Weight Bearing Restrictions  No                  [facility] Adult PT Treatment/Exercise - 10/17/17 0001  Exercises   Exercises  Shoulder      Shoulder Exercises: Standing   Horizontal ABduction  Strengthening;Both;Theraband x30    Other Standing Exercises  UE ranger clockwise, counter clockwise 20x each    Other Standing Exercises  finger ladder x3 minutes #27/28      Shoulder Exercises: ROM/Strengthening   UBE (Upper Arm Bike)  120 RPM x8 min (4 min forward, 4 min backward)      Manual Therapy   Manual Therapy  Soft tissue mobilization    Soft tissue mobilization  STW to R pectoralis, posterior shoulder and axilla with PROM to reduce muscle tightness and pain and improve ROM    Passive ROM  PROM to improve R flexion, abduction, ER with gentle over pressure                  PT Long Term Goals - 09/19/17 2020       PT LONG TERM GOAL #1   Title  Patient will be independent in home exercise program for right shoulder ROM, pect stretches, upper back strengthening.    Time  4    Period  Weeks    Status  New      PT LONG TERM GOAL #2   Title  Right shoulder flexion to at least 140 degrees for improved overhead reach.    Baseline  120 at eval compared to 153 on the left    Time  4    Period  Weeks    Status  New      PT LONG TERM GOAL #3   Title  right shoulder abduction to at least 140 degrees for improved ADLs    Baseline  105 at eval compared to 160 on the left    Time  4    Period  Weeks    Status  New      PT LONG TERM GOAL #4   Title  right shoulder horizontal abduction to at least 20 degrees    Baseline  8 degrees at eval compared to 25 on the left    Time  4    Period  Weeks    Status  New      PT LONG TERM GOAL #5   Title  patient will show at least 65 degrees of right shoulder external rotation for improved ADLs    Baseline  47 at eval compared to 81 on the left    Time  4    Period  Weeks    Status  New      Additional Long Term Goals   Additional Long Term Goals  Yes      PT LONG TERM GOAL #6   Title  Pt. will report at least 50% decrease overall in right shoulder pain (intensity and/or frequency)    Baseline  up to 10/10 at times    Time  4    Period  Weeks    Status  New      PT LONG TERM GOAL #7   Title  Quick DASH score will be reduced to 25 or less    Baseline  50 at eval    Time  4    Period  Weeks    Status  New            Plan - 10/17/17 1646    Clinical Impression Statement  Patient continues to make improvements with PROM. Patient still reports "pulling" during certain motion but feels it has "  loosened up" since the start of PT. Assess ROM active and passive next visit..    Clinical Presentation  Stable    Clinical Decision Making  Low    Rehab Potential  Good    Clinical Impairments Affecting Rehab Potential  h/o radiation to right chest probably  limits the potential for normal soft tissue mobility in that area    PT Frequency  2x / week    PT Duration  4 weeks    PT Treatment/Interventions  ADLs/Self Care Home Management;Therapeutic exercise;Patient/family education;Manual techniques;Scar mobilization;Passive range of motion    PT Next Visit Plan  Instruct in home exercise program for right shoulder ROM; begin soft tissue mobilization and myofascial release to right chest/incision/shoulder area; include passive ROM to right shoulder; pect stretches.  Later, include upper back strengthening and posture re-ed.     Consulted and Agree with Plan of Care  Patient       Patient will benefit from skilled therapeutic intervention in order to improve the following deficits and impairments:  Decreased range of motion, Pain, Decreased scar mobility, Postural dysfunction, Impaired UE functional use  Visit Diagnosis: Stiffness of right shoulder, not elsewhere classified  Abnormal posture  Disorder of the skin and subcutaneous tissue related to radiation, unspecified  Aftercare following surgery for neoplasm  Right shoulder pain, unspecified chronicity  Acute pain of right shoulder  Muscle weakness (generalized)  Localized edema     Problem List Patient Active Problem List   Diagnosis Date Noted  . Port catheter in place 09/08/2016  . Malignant neoplasm of upper-outer quadrant of right breast in female, estrogen receptor positive ([place]) 08/14/2016  . Breast cancer of upper-outer quadrant of right female breast ([place]) 08/13/2016  . Varicose veins of lower extremities with other complications 88/91/6945  . Hypothyroidism 04/24/2013   Gabriela Eves, PT, DPT 10/17/2017, 4:52 PM  [facility] [address] [place], [place], [address] Phone: [phone]   Fax:  [phone]  Name: Cynthia Chavez MRN: [id number] Date of Birth: 07-10-1956

## 2017-10-22 ENCOUNTER — Encounter: Payer: BLUE CROSS/BLUE SHIELD | Admitting: Physical Therapy

## 2017-10-23 ENCOUNTER — Ambulatory Visit: Payer: BLUE CROSS/BLUE SHIELD | Admitting: Physical Therapy

## 2017-10-23 DIAGNOSIS — R293 Abnormal posture: Secondary | ICD-10-CM

## 2017-10-23 DIAGNOSIS — M25611 Stiffness of right shoulder, not elsewhere classified: Secondary | ICD-10-CM

## 2017-10-23 DIAGNOSIS — L599 Disorder of the skin and subcutaneous tissue related to radiation, unspecified: Secondary | ICD-10-CM

## 2017-10-23 DIAGNOSIS — Z483 Aftercare following surgery for neoplasm: Secondary | ICD-10-CM

## 2017-10-23 DIAGNOSIS — M25511 Pain in right shoulder: Secondary | ICD-10-CM

## 2017-10-23 NOTE — Therapy (Signed)
[facility] [place], [place], [address] Phone: [phone]   Fax:  [phone]  Physical Therapy Treatment  Patient Details  Name: Cynthia Chavez MRN: [id number] Date of Birth: February 10, 1956 Referring Provider: Dr. Rolm Bookbinder   Encounter Date: 10/23/2017  PT End of Session - 10/23/17 1302    Visit Number  8    Number of Visits  9    Date for PT Re-Evaluation  10/26/17    PT Start Time  1300    PT Stop Time  1348    PT Time Calculation (min)  48 min    Activity Tolerance  Patient tolerated treatment well    Behavior During Therapy  [facility] for tasks assessed/performed       Past Medical History:  Diagnosis Date  . Anxiety   . Breast cancer ([place])   . Cancer [facility])    breast cancer  . Depression   . Edema    bilateral feet and leg swelling  . GERD (gastroesophageal reflux disease)   . History of hiatal hernia   . History of radiation therapy 02/12/17- 03/26/17   Chest wall and regional nodes, right- 50 Gy in 25 fractions, Chest Wall, boost, right- 10 Gy in 5 fractions.   . Hypothyroidism   . Malaise   . Thyroid disease   . Varicose veins     Past Surgical History:  Procedure Laterality Date  . ABDOMINAL HYSTERECTOMY    . AXILLARY LYMPH NODE BIOPSY Right 01/03/2017   Procedure: RIGHT AXILLARY LYMPH NODE SEED GUIDED EXCISIONAL BIOPSY;  Surgeon: Rolm Bookbinder, MD;  Location: [place];  Service: General;  Laterality: Right;  . CHOLECYSTECTOMY    . COLONOSCOPY    . ESOPHAGEAL DILATION    . MASTECTOMY Right   . MASTECTOMY W/ SENTINEL NODE BIOPSY Right 01/03/2017   axillary  . MASTECTOMY W/ SENTINEL NODE BIOPSY Right 01/03/2017   Procedure: RIGHT TOTAL MASTECTOMY WITH RIGHT AXILLARY SENTINEL LYMPH NODE BIOPSY;  Surgeon: Rolm Bookbinder, MD;  Location: [place];  Service: General;  Laterality: Right;  . PORTACATH PLACEMENT Left 08/17/2016   Procedure: INSERTION PORT-A-CATH WITH [place];  Surgeon: Rolm Bookbinder, MD;   Location: [place];  Service: General;  Laterality: Left;    There were no vitals filed for this visit.  Subjective Assessment - 10/23/17 1304    Subjective  Patient reported she is still feeling sore and still experiencing intermittent "grabbing" sensation at rest.    Pertinent History  Right breast cancer diagnosed; 08/16/16-12/01/16 completed chemotherapy except continued Herceptin until December. 01/03/17 Right mastectomy followed by radiation, completed 03/26/17. Reports neuropathy in hands (right>left) and in feet. Hypothyoid.     Patient Stated Goals  stop the pain    Currently in Pain?  No/denies    Pain Onset  More than a month ago         [facility] PT Assessment - 10/23/17 0001      Assessment   Medical Diagnosis  right breast cancer s/p chemo, mastectomy and radiation      Precautions   Precautions  Other (comment)    Precaution Comments  cancer precautions: avoid modalities to the right upper quadrant other than possibly electrical stimulation or iontophoresis if warranted      Restrictions   Weight Bearing Restrictions  No                  [facility] Adult PT Treatment/Exercise - 10/23/17 0001      Exercises  Exercises  Shoulder      Shoulder Exercises: Standing   Internal Rotation  AAROM;20 reps;Both with cane, behind back    Extension  AAROM;Both;20 reps    Extension Limitations  with cane    Other Standing Exercises  overhead reaching to middle shelf with 1#x20 followed by reaching to top shelf with 1# x20      Shoulder Exercises: Pulleys   Flexion  5 minutes      Shoulder Exercises: ROM/Strengthening   UBE (Upper Arm Bike)  120 RPM x8 min (4 min forward, 4 min backward)      Shoulder Exercises: Stretch   Corner Stretch  20 seconds;5 reps    Cross Chest Stretch  4 reps;20 seconds      Manual Therapy   Manual Therapy  Soft tissue mobilization    Soft tissue mobilization  STW to R pectoralis, posterior shoulder and axilla with PROM to  reduce muscle tightness and pain and improve ROM    Passive ROM  PROM to improve R flexion, abduction, ER with gentle over pressure                  PT Long Term Goals - 09/19/17 2020      PT LONG TERM GOAL #1   Title  Patient will be independent in home exercise program for right shoulder ROM, pect stretches, upper back strengthening.    Time  4    Period  Weeks    Status  New      PT LONG TERM GOAL #2   Title  Right shoulder flexion to at least 140 degrees for improved overhead reach.    Baseline  120 at eval compared to 153 on the left    Time  4    Period  Weeks    Status  New      PT LONG TERM GOAL #3   Title  right shoulder abduction to at least 140 degrees for improved ADLs    Baseline  105 at eval compared to 160 on the left    Time  4    Period  Weeks    Status  New      PT LONG TERM GOAL #4   Title  right shoulder horizontal abduction to at least 20 degrees    Baseline  8 degrees at eval compared to 25 on the left    Time  4    Period  Weeks    Status  New      PT LONG TERM GOAL #5   Title  patient will show at least 65 degrees of right shoulder external rotation for improved ADLs    Baseline  47 at eval compared to 81 on the left    Time  4    Period  Weeks    Status  New      Additional Long Term Goals   Additional Long Term Goals  Yes      PT LONG TERM GOAL #6   Title  Pt. will report at least 50% decrease overall in right shoulder pain (intensity and/or frequency)    Baseline  up to 10/10 at times    Time  4    Period  Weeks    Status  New      PT LONG TERM GOAL #7   Title  Quick DASH score will be reduced to 25 or less    Baseline  50 at eval    Time  4    Period  Weeks    Status  New            Plan - 10/23/17 1357    Clinical Impression Statement  Patient was able to complete exercises with minimal soreness. Patient has made improvements with AROM however she still experiences tightness and pulling. Patient still noted with  increased tightness and tension along muscles in R axilla. PT and patient discussed assessing goals and ROM next session.    Clinical Presentation  Stable    Clinical Decision Making  Low    Rehab Potential  Good    Clinical Impairments Affecting Rehab Potential  h/o radiation to right chest probably limits the potential for normal soft tissue mobility in that area    PT Frequency  2x / week    PT Duration  4 weeks    PT Treatment/Interventions  ADLs/Self Care Home Management;Therapeutic exercise;Patient/family education;Manual techniques;Scar mobilization;Passive range of motion    PT Next Visit Plan  Instruct in home exercise program for right shoulder ROM; begin soft tissue mobilization and myofascial release to right chest/incision/shoulder area; include passive ROM to right shoulder; pect stretches.  Later, include upper back strengthening and posture re-ed.     Consulted and Agree with Plan of Care  Patient       Patient will benefit from skilled therapeutic intervention in order to improve the following deficits and impairments:  Decreased range of motion, Pain, Decreased scar mobility, Postural dysfunction, Impaired UE functional use  Visit Diagnosis: Stiffness of right shoulder, not elsewhere classified  Abnormal posture  Disorder of the skin and subcutaneous tissue related to radiation, unspecified  Aftercare following surgery for neoplasm  Right shoulder pain, unspecified chronicity  Acute pain of right shoulder     Problem List Patient Active Problem List   Diagnosis Date Noted  . Port catheter in place 09/08/2016  . Malignant neoplasm of upper-outer quadrant of right breast in female, estrogen receptor positive ([place]) 08/14/2016  . Breast cancer of upper-outer quadrant of right female breast ([place]) 08/13/2016  . Varicose veins of lower extremities with other complications 25/85/2778  . Hypothyroidism 04/24/2013   Gabriela Eves, PT, DPT 10/23/2017, 2:03  PM  [facility] [address] [place], [place], [address] Phone: [phone]   Fax:  [phone]  Name: ADASHA BOEHME MRN: [id number] Date of Birth: 1955-11-25

## 2017-10-24 ENCOUNTER — Ambulatory Visit: Payer: BLUE CROSS/BLUE SHIELD | Admitting: Physical Therapy

## 2017-10-24 DIAGNOSIS — L599 Disorder of the skin and subcutaneous tissue related to radiation, unspecified: Secondary | ICD-10-CM

## 2017-10-24 DIAGNOSIS — R293 Abnormal posture: Secondary | ICD-10-CM

## 2017-10-24 DIAGNOSIS — M25611 Stiffness of right shoulder, not elsewhere classified: Secondary | ICD-10-CM | POA: Diagnosis not present

## 2017-10-24 DIAGNOSIS — R600 Localized edema: Secondary | ICD-10-CM

## 2017-10-24 DIAGNOSIS — Z483 Aftercare following surgery for neoplasm: Secondary | ICD-10-CM

## 2017-10-24 DIAGNOSIS — M25511 Pain in right shoulder: Secondary | ICD-10-CM

## 2017-10-24 DIAGNOSIS — M6281 Muscle weakness (generalized): Secondary | ICD-10-CM

## 2017-10-24 NOTE — Therapy (Signed)
[facility] [place], [place], [address] Phone: [phone]   Fax:  [phone]  Physical Therapy Treatment  Patient Details  Name: Cynthia Chavez MRN: [id number] Date of Birth: [birth year] Referring Provider: Dr. Rolm Bookbinder   Encounter Date: 10/24/2017  PT End of Session - 10/24/17 1046    Visit Number  9    Number of Visits  17    Date for PT Re-Evaluation  11/28/17    PT Start Time  1030    PT Stop Time  1117    PT Time Calculation (min)  47 min    Activity Tolerance  Patient tolerated treatment well    Behavior During Therapy  [facility] for tasks assessed/performed       Past Medical History:  Diagnosis Date  . Anxiety   . Breast cancer ([place])   . Cancer [facility])    breast cancer  . Depression   . Edema    bilateral feet and leg swelling  . GERD (gastroesophageal reflux disease)   . History of hiatal hernia   . History of radiation therapy 02/12/17- 03/26/17   Chest wall and regional nodes, right- 50 Gy in 25 fractions, Chest Wall, boost, right- 10 Gy in 5 fractions.   . Hypothyroidism   . Malaise   . Thyroid disease   . Varicose veins     Past Surgical History:  Procedure Laterality Date  . ABDOMINAL HYSTERECTOMY    . AXILLARY LYMPH NODE BIOPSY Right 01/03/2017   Procedure: RIGHT AXILLARY LYMPH NODE SEED GUIDED EXCISIONAL BIOPSY;  Surgeon: Rolm Bookbinder, MD;  Location: [place];  Service: General;  Laterality: Right;  . CHOLECYSTECTOMY    . COLONOSCOPY    . ESOPHAGEAL DILATION    . MASTECTOMY Right   . MASTECTOMY W/ SENTINEL NODE BIOPSY Right 01/03/2017   axillary  . MASTECTOMY W/ SENTINEL NODE BIOPSY Right 01/03/2017   Procedure: RIGHT TOTAL MASTECTOMY WITH RIGHT AXILLARY SENTINEL LYMPH NODE BIOPSY;  Surgeon: Rolm Bookbinder, MD;  Location: [place];  Service: General;  Laterality: Right;  . PORTACATH PLACEMENT Left 08/17/2016   Procedure: INSERTION PORT-A-CATH WITH [place];  Surgeon: Rolm Bookbinder, MD;   Location: [place];  Service: General;  Laterality: Left;    There were no vitals filed for this visit.  Subjective Assessment - 10/23/17 1304    Subjective  Patient reported she is still feeling sore and still experiencing intermittent "grabbing" sensation at rest.    Pertinent History  Right breast cancer diagnosed; 08/16/16-12/01/16 completed chemotherapy except continued Herceptin until December. 01/03/17 Right mastectomy followed by radiation, completed 03/26/17. Reports neuropathy in hands (right>left) and in feet. Hypothyoid.     Patient Stated Goals  stop the pain    Currently in Pain?  No/denies    Pain Onset  More than a month ago         [facility] PT Assessment - 10/24/17 0001      Assessment   Medical Diagnosis  right breast cancer s/p chemo, mastectomy and radiation      Precautions   Precautions  Other (comment)    Precaution Comments  cancer precautions: avoid modalities to the right upper quadrant other than possibly electrical stimulation or iontophoresis if warranted      Restrictions   Weight Bearing Restrictions  No           Quick Dash - 10/24/17 0001    Open a tight or new jar  Moderate difficulty  Do heavy household chores (wash walls, wash floors)  Moderate difficulty    Carry a shopping bag or briefcase  Mild difficulty    Wash your back  Mild difficulty    Use a knife to cut food  No difficulty    Recreational activities in which you take some force or impact through your arm, shoulder, or hand (golf, hammering, tennis)  Severe difficulty    During the past week, to what extent has your arm, shoulder or hand problem interfered with your normal social activities with family, friends, neighbors, or groups?  Slightly    During the past week, to what extent has your arm, shoulder or hand problem limited your work or other regular daily activities  Modererately    Arm, shoulder, or hand pain.  Mild    Tingling (pins and needles) in your arm,  shoulder, or hand  Mild    Difficulty Sleeping  Mild difficulty    DASH Score  34.09 %            [facility] Adult PT Treatment/Exercise - 10/24/17 0001      Exercises   Exercises  Shoulder      Shoulder Exercises: Standing   Internal Rotation  AAROM;20 reps;Both    Extension  AAROM;Both;20 reps    Extension Limitations  with cane    Other Standing Exercises  behind the back adduction with cane x 20      Shoulder Exercises: ROM/Strengthening   UBE (Upper Arm Bike)  120 RPM x8 min (4 min forward, 4 min backward)      Manual Therapy   Manual Therapy  Soft tissue mobilization    Soft tissue mobilization  STW to R pectoralis, posterior shoulder and axilla with PROM to reduce muscle tightness and pain and improve ROM    Passive ROM  PROM to improve R flexion, abduction, ER  and IR with gentle over pressure                  PT Long Term Goals - 10/24/17 1117      PT LONG TERM GOAL #1   Title  Patient will be independent in home exercise program for right shoulder ROM, pect stretches, upper back strengthening.    Time  4    Period  Weeks    Status  Achieved      PT LONG TERM GOAL #2   Title  Right shoulder flexion to at least 140 degrees for improved overhead reach.    Baseline  142 degrees     Time  4    Period  Weeks    Status  Achieved      PT LONG TERM GOAL #3   Title  right shoulder abduction to at least 140 degrees for improved ADLs    Baseline  120    Time  4    Period  Weeks    Status  On-going      PT LONG TERM GOAL #4   Title  right shoulder horizontal abduction to at least 20 degrees    Baseline  12    Time  4    Period  Weeks    Status  On-going      PT LONG TERM GOAL #5   Title  patient will show at least 65 degrees of right shoulder external rotation for improved ADLs    Baseline  50 degrees at 90 degrees of abduction    Time  4  Period  Weeks    Status  On-going      PT LONG TERM GOAL #6   Title  Pt. will report at least 50% decrease  overall in right shoulder pain (intensity and/or frequency)    Time  4    Period  Weeks    Status  Achieved      PT LONG TERM GOAL #7   Title  Quick DASH score will be reduced to 25 or less    Baseline  34.09 quck dash    Time  4    Period  Weeks    Status  On-going            Plan - 10/24/17 1142    Clinical Impression Statement  Patient was able to complete exercises with minimal sorness. Patient has made improvements with PROM flexion and abduction however still has some difficulties with IR and ER. Patient's goals are ongoing. Requesting 8 additional visits to address ongoing goals.     Clinical Presentation  Stable    Clinical Decision Making  Low    Rehab Potential  Good    Clinical Impairments Affecting Rehab Potential  h/o radiation to right chest probably limits the potential for normal soft tissue mobility in that area    PT Frequency  2x / week    PT Duration  4 weeks    PT Treatment/Interventions  ADLs/Self Care Home Management;Therapeutic exercise;Patient/family education;Manual techniques;Scar mobilization;Passive range of motion    PT Next Visit Plan  soft tissue mobilization and myofascial release to right chest/incision/shoulder area; include passive ROM to right shoulder; pect stretches.  Later, include upper back strengthening and posture re-ed.     Consulted and Agree with Plan of Care  Patient       Patient will benefit from skilled therapeutic intervention in order to improve the following deficits and impairments:  Decreased range of motion, Pain, Decreased scar mobility, Postural dysfunction, Impaired UE functional use  Visit Diagnosis: Stiffness of right shoulder, not elsewhere classified  Abnormal posture  Disorder of the skin and subcutaneous tissue related to radiation, unspecified  Aftercare following surgery for neoplasm  Right shoulder pain, unspecified chronicity  Acute pain of right shoulder  Muscle weakness (generalized)  Localized  edema     Problem List Patient Active Problem List   Diagnosis Date Noted  . Port catheter in place 09/08/2016  . Malignant neoplasm of upper-outer quadrant of right breast in female, estrogen receptor positive ([place]) 08/14/2016  . Breast cancer of upper-outer quadrant of right female breast ([place]) 08/13/2016  . Varicose veins of lower extremities with other complications 05/39/7673  . Hypothyroidism 04/24/2013    Gabriela Eves 10/24/2017, 12:43 PM  [facility] [place], [place], [address] Phone: [phone]   Fax:  [phone]  Name: Cynthia Chavez MRN: [id number] Date of Birth: 11-Jun-1956

## 2017-10-29 ENCOUNTER — Ambulatory Visit: Payer: BLUE CROSS/BLUE SHIELD | Admitting: Physical Therapy

## 2017-10-29 DIAGNOSIS — Z483 Aftercare following surgery for neoplasm: Secondary | ICD-10-CM

## 2017-10-29 DIAGNOSIS — M6281 Muscle weakness (generalized): Secondary | ICD-10-CM

## 2017-10-29 DIAGNOSIS — L599 Disorder of the skin and subcutaneous tissue related to radiation, unspecified: Secondary | ICD-10-CM

## 2017-10-29 DIAGNOSIS — M25611 Stiffness of right shoulder, not elsewhere classified: Secondary | ICD-10-CM

## 2017-10-29 DIAGNOSIS — R293 Abnormal posture: Secondary | ICD-10-CM

## 2017-10-29 DIAGNOSIS — M25511 Pain in right shoulder: Secondary | ICD-10-CM

## 2017-10-29 DIAGNOSIS — R600 Localized edema: Secondary | ICD-10-CM

## 2017-10-29 NOTE — Therapy (Signed)
[facility] [place], [place], [address] Phone: [phone]   Fax:  [phone]  Physical Therapy Treatment  Patient Details  Name: Cynthia Chavez MRN: [id number] Date of Birth: 1955/08/24 Referring Provider: Dr. Rolm Bookbinder   Encounter Date: 10/29/2017  PT End of Session - 10/29/17 1131    Visit Number  10    Number of Visits  17    Date for PT Re-Evaluation  11/28/17    PT Start Time  1115    PT Stop Time  1208    PT Time Calculation (min)  53 min    Activity Tolerance  Patient tolerated treatment well    Behavior During Therapy  [facility] for tasks assessed/performed       Past Medical History:  Diagnosis Date  . Anxiety   . Breast cancer ([place])   . Cancer [facility])    breast cancer  . Depression   . Edema    bilateral feet and leg swelling  . GERD (gastroesophageal reflux disease)   . History of hiatal hernia   . History of radiation therapy 02/12/17- 03/26/17   Chest wall and regional nodes, right- 50 Gy in 25 fractions, Chest Wall, boost, right- 10 Gy in 5 fractions.   . Hypothyroidism   . Malaise   . Thyroid disease   . Varicose veins     Past Surgical History:  Procedure Laterality Date  . ABDOMINAL HYSTERECTOMY    . AXILLARY LYMPH NODE BIOPSY Right 01/03/2017   Procedure: RIGHT AXILLARY LYMPH NODE SEED GUIDED EXCISIONAL BIOPSY;  Surgeon: Rolm Bookbinder, MD;  Location: [place];  Service: General;  Laterality: Right;  . CHOLECYSTECTOMY    . COLONOSCOPY    . ESOPHAGEAL DILATION    . MASTECTOMY Right   . MASTECTOMY W/ SENTINEL NODE BIOPSY Right 01/03/2017   axillary  . MASTECTOMY W/ SENTINEL NODE BIOPSY Right 01/03/2017   Procedure: RIGHT TOTAL MASTECTOMY WITH RIGHT AXILLARY SENTINEL LYMPH NODE BIOPSY;  Surgeon: Rolm Bookbinder, MD;  Location: [place];  Service: General;  Laterality: Right;  . PORTACATH PLACEMENT Left 08/17/2016   Procedure: INSERTION PORT-A-CATH WITH [place];  Surgeon: Rolm Bookbinder, MD;   Location: [place];  Service: General;  Laterality: Left;    There were no vitals filed for this visit.  Subjective Assessment - 10/29/17 1131    Subjective  Patient reported no new complaints. She stated she was unable to sleep well last night due to "grabbing" sensation.    Pertinent History  Right breast cancer diagnosed; 08/16/16-12/01/16 completed chemotherapy except continued Herceptin until December. 01/03/17 Right mastectomy followed by radiation, completed 03/26/17. Reports neuropathy in hands (right>left) and in feet. Hypothyoid.     Patient Stated Goals  stop the pain    Currently in Pain?  No/denies         [facility] PT Assessment - 10/29/17 0001      Assessment   Medical Diagnosis  right breast cancer s/p chemo, mastectomy and radiation      Precautions   Precautions  Other (comment)            No data recorded               PT Education - 10/29/17 1210    Education provided  Yes    Education Details  supine cane flexion, abduction and ER.    Person(s) Educated  Patient    Methods  Handout;Explanation;Demonstration    Comprehension  Verbalized understanding;Returned demonstration  PT Long Term Goals - 10/24/17 1117      PT LONG TERM GOAL #1   Title  Patient will be independent in home exercise program for right shoulder ROM, pect stretches, upper back strengthening.    Time  4    Period  Weeks    Status  Achieved      PT LONG TERM GOAL #2   Title  Right shoulder flexion to at least 140 degrees for improved overhead reach.    Baseline  142 degrees     Time  4    Period  Weeks    Status  Achieved      PT LONG TERM GOAL #3   Title  right shoulder abduction to at least 140 degrees for improved ADLs    Baseline  120    Time  4    Period  Weeks    Status  On-going      PT LONG TERM GOAL #4   Title  right shoulder horizontal abduction to at least 20 degrees    Baseline  12    Time  4    Period  Weeks    Status   On-going      PT LONG TERM GOAL #5   Title  patient will show at least 65 degrees of right shoulder external rotation for improved ADLs    Baseline  50 degrees at 90 degrees of abduction    Time  4    Period  Weeks    Status  On-going      PT LONG TERM GOAL #6   Title  Pt. will report at least 50% decrease overall in right shoulder pain (intensity and/or frequency)    Time  4    Period  Weeks    Status  Achieved      PT LONG TERM GOAL #7   Title  Quick DASH score will be reduced to 25 or less    Baseline  34.09 quck dash    Time  4    Period  Weeks    Status  On-going            Plan - 10/29/17 1211    Clinical Impression Statement  Patient was able to tolerate treatment well despite reports increased "tightness" during PROM. Patient educated on proper form for HEP and she demonstrated exercises with proper form after tactile cuing. Patient educated that "grabbing" sensation and axilla tightness is secondary to radiation and it may take months for radiation to get out of system. Patient also educated the neuropathy experienced in hands may or may not go away. Patient reported understanding.    Clinical Presentation  Stable    Clinical Decision Making  Low    Rehab Potential  Good    Clinical Impairments Affecting Rehab Potential  h/o radiation to right chest probably limits the potential for normal soft tissue mobility in that area    PT Frequency  2x / week    PT Duration  4 weeks    PT Treatment/Interventions  ADLs/Self Care Home Management;Therapeutic exercise;Patient/family education;Manual techniques;Scar mobilization;Passive range of motion    PT Next Visit Plan  Assess form with HEP, add postural exercises. Continue STW/M and passive ROM to improve function with ADLS.    Consulted and Agree with Plan of Care  Patient       Patient will benefit from skilled therapeutic intervention in order to improve the following deficits and impairments:  Decreased range of motion,  Pain, Decreased scar mobility, Postural dysfunction, Impaired UE functional use  Visit Diagnosis: Stiffness of right shoulder, not elsewhere classified  Abnormal posture  Disorder of the skin and subcutaneous tissue related to radiation, unspecified  Aftercare following surgery for neoplasm  Right shoulder pain, unspecified chronicity  Acute pain of right shoulder  Muscle weakness (generalized)  Localized edema     Problem List Patient Active Problem List   Diagnosis Date Noted  . Port catheter in place 09/08/2016  . Malignant neoplasm of upper-outer quadrant of right breast in female, estrogen receptor positive ([place]) 08/14/2016  . Breast cancer of upper-outer quadrant of right female breast ([place]) 08/13/2016  . Varicose veins of lower extremities with other complications 65/99/3570  . Hypothyroidism 04/24/2013   Gabriela Eves, PT, DPT 10/29/2017, 12:17 PM  [facility] [address] [place], [place], [address] Phone: [phone]   Fax:  [phone]  Name: Cynthia Chavez MRN: [id number] Date of Birth: 11/25/1955

## 2017-10-29 NOTE — Patient Instructions (Signed)
   Sylvester Minton, PT, DPT [facility] [address] [place], [place], [address] Phone: [phone]   Fax:  [phone]  

## 2017-10-31 ENCOUNTER — Ambulatory Visit: Payer: BLUE CROSS/BLUE SHIELD | Admitting: Physical Therapy

## 2017-10-31 ENCOUNTER — Encounter: Payer: Self-pay | Admitting: Physical Therapy

## 2017-10-31 DIAGNOSIS — M25511 Pain in right shoulder: Secondary | ICD-10-CM

## 2017-10-31 DIAGNOSIS — L599 Disorder of the skin and subcutaneous tissue related to radiation, unspecified: Secondary | ICD-10-CM

## 2017-10-31 DIAGNOSIS — M25611 Stiffness of right shoulder, not elsewhere classified: Secondary | ICD-10-CM | POA: Diagnosis not present

## 2017-10-31 DIAGNOSIS — Z483 Aftercare following surgery for neoplasm: Secondary | ICD-10-CM

## 2017-10-31 DIAGNOSIS — R293 Abnormal posture: Secondary | ICD-10-CM

## 2017-10-31 NOTE — Therapy (Signed)
[facility] [place], [place], [address] Phone: [phone]   Fax:  [phone]  Physical Therapy Treatment  Patient Details  Name: Cynthia Chavez MRN: [id number] Date of Birth: Apr 10, 1956 Referring Provider: Dr. Rolm Bookbinder   Encounter Date: 10/31/2017  PT End of Session - 10/31/17 1320    Visit Number  11    Number of Visits  17    Date for PT Re-Evaluation  11/28/17    PT Start Time  0240    PT Stop Time  1348    PT Time Calculation (min)  45 min    Activity Tolerance  Patient tolerated treatment well    Behavior During Therapy  [facility] for tasks assessed/performed       Past Medical History:  Diagnosis Date  . Anxiety   . Breast cancer ([place])   . Cancer [facility])    breast cancer  . Depression   . Edema    bilateral feet and leg swelling  . GERD (gastroesophageal reflux disease)   . History of hiatal hernia   . History of radiation therapy 02/12/17- 03/26/17   Chest wall and regional nodes, right- 50 Gy in 25 fractions, Chest Wall, boost, right- 10 Gy in 5 fractions.   . Hypothyroidism   . Malaise   . Thyroid disease   . Varicose veins     Past Surgical History:  Procedure Laterality Date  . ABDOMINAL HYSTERECTOMY    . AXILLARY LYMPH NODE BIOPSY Right 01/03/2017   Procedure: RIGHT AXILLARY LYMPH NODE SEED GUIDED EXCISIONAL BIOPSY;  Surgeon: Rolm Bookbinder, MD;  Location: [place];  Service: General;  Laterality: Right;  . CHOLECYSTECTOMY    . COLONOSCOPY    . ESOPHAGEAL DILATION    . MASTECTOMY Right   . MASTECTOMY W/ SENTINEL NODE BIOPSY Right 01/03/2017   axillary  . MASTECTOMY W/ SENTINEL NODE BIOPSY Right 01/03/2017   Procedure: RIGHT TOTAL MASTECTOMY WITH RIGHT AXILLARY SENTINEL LYMPH NODE BIOPSY;  Surgeon: Rolm Bookbinder, MD;  Location: [place];  Service: General;  Laterality: Right;  . PORTACATH PLACEMENT Left 08/17/2016   Procedure: INSERTION PORT-A-CATH WITH [place];  Surgeon: Rolm Bookbinder, MD;   Location: [place];  Service: General;  Laterality: Left;    There were no vitals filed for this visit.  Subjective Assessment - 10/31/17 1305    Subjective  Patient reported doing good after last treatment 0 pain upon arrival yet up to 8/10 sharp pain at times for unknown reasons/may be certain movements    Pertinent History  Right breast cancer diagnosed; 08/16/16-12/01/16 completed chemotherapy except continued Herceptin until December. 01/03/17 Right mastectomy followed by radiation, completed 03/26/17. Reports neuropathy in hands (right>left) and in feet. Hypothyoid.     Currently in Pain?  No/denies         [facility] PT Assessment - 10/31/17 0001      ROM / Strength   AROM / PROM / Strength  AROM;PROM      AROM   AROM Assessment Site  Shoulder    Right/Left Shoulder  Right    Right Shoulder ABduction  110 Degrees    Right Shoulder External Rotation  57 Degrees      PROM   PROM Assessment Site  Shoulder    Right/Left Shoulder  Right    Right Shoulder External Rotation  640 Degrees            No data recorded       [facility] Adult PT  Treatment/Exercise - 10/31/17 0001      Shoulder Exercises: Standing   Internal Rotation  AAROM;20 reps;Both    Extension  AAROM;Both;20 reps    Extension Limitations  with cane    Other Standing Exercises  behind the back adduction with cane x 20      Shoulder Exercises: Pulleys   Other Pulley Exercises  wall walk for abd x 53min      Shoulder Exercises: ROM/Strengthening   UBE (Upper Arm Bike)  120 RPM x8 min (4 min forward, 4 min backward)      Manual Therapy   Manual Therapy  Soft tissue mobilization    Soft tissue mobilization  STW to R pectoralis, posterior shoulder and axilla with PROM to reduce muscle tightness and pain and improve ROM    Passive ROM  PROM to improve R flexion, abduction, ER  and IR with gentle over pressure                  PT Long Term Goals - 10/24/17 1117      PT LONG TERM  GOAL #1   Title  Patient will be independent in home exercise program for right shoulder ROM, pect stretches, upper back strengthening.    Time  4    Period  Weeks    Status  Achieved      PT LONG TERM GOAL #2   Title  Right shoulder flexion to at least 140 degrees for improved overhead reach.    Baseline  142 degrees     Time  4    Period  Weeks    Status  Achieved      PT LONG TERM GOAL #3   Title  right shoulder abduction to at least 140 degrees for improved ADLs    Baseline  120    Time  4    Period  Weeks    Status  On-going      PT LONG TERM GOAL #4   Title  right shoulder horizontal abduction to at least 20 degrees    Baseline  12    Time  4    Period  Weeks    Status  On-going      PT LONG TERM GOAL #5   Title  patient will show at least 65 degrees of right shoulder external rotation for improved ADLs    Baseline  50 degrees at 90 degrees of abduction    Time  4    Period  Weeks    Status  On-going      PT LONG TERM GOAL #6   Title  Pt. will report at least 50% decrease overall in right shoulder pain (intensity and/or frequency)    Time  4    Period  Weeks    Status  Achieved      PT LONG TERM GOAL #7   Title  Quick DASH score will be reduced to 25 or less    Baseline  34.09 quck dash    Time  4    Period  Weeks    Status  On-going            Plan - 10/31/17 1350    Clinical Impression Statement  Patient tolerated treatment well today. Patient able to progress with ROM activities slowly and comfortable per reported. Patient has improved with ROM in right shoulder yet ongoing limitations. Patient has palpable pain and tightness axilla area. Patient doing well with greater ease with ADL's with  some limitations due to ongoing pain stymptoms with certain movements. Goals ongoing at this time.      Rehab Potential  Good    Clinical Impairments Affecting Rehab Potential  h/o radiation to right chest probably limits the potential for normal soft tissue  mobility in that area    PT Frequency  2x / week    PT Duration  4 weeks    PT Treatment/Interventions  ADLs/Self Care Home Management;Therapeutic exercise;Patient/family education;Manual techniques;Scar mobilization;Passive range of motion    PT Next Visit Plan  Assess form with HEP, add postural exercises. Continue STW/M and passive ROM to improve function with ADLS.    Consulted and Agree with Plan of Care  Patient       Patient will benefit from skilled therapeutic intervention in order to improve the following deficits and impairments:  Decreased range of motion, Pain, Decreased scar mobility, Postural dysfunction, Impaired UE functional use  Visit Diagnosis: Stiffness of right shoulder, not elsewhere classified  Abnormal posture  Disorder of the skin and subcutaneous tissue related to radiation, unspecified  Aftercare following surgery for neoplasm  Right shoulder pain, unspecified chronicity  Acute pain of right shoulder     Problem List Patient Active Problem List   Diagnosis Date Noted  . Port catheter in place 09/08/2016  . Malignant neoplasm of upper-outer quadrant of right breast in female, estrogen receptor positive ([place]) 08/14/2016  . Breast cancer of upper-outer quadrant of right female breast ([place]) 08/13/2016  . Varicose veins of lower extremities with other complications 78/58/8502  . Hypothyroidism 04/24/2013    Aamari West P, PTA 10/31/2017, 1:58 PM  [facility] [address] [place], [place], [address] Phone: [phone]   Fax:  [phone]  Name: Cynthia Chavez MRN: [id number] Date of Birth: 1956-07-27

## 2017-11-05 ENCOUNTER — Ambulatory Visit: Payer: BLUE CROSS/BLUE SHIELD | Attending: General Surgery | Admitting: Physical Therapy

## 2017-11-05 DIAGNOSIS — Z483 Aftercare following surgery for neoplasm: Secondary | ICD-10-CM | POA: Diagnosis present

## 2017-11-05 DIAGNOSIS — M25611 Stiffness of right shoulder, not elsewhere classified: Secondary | ICD-10-CM | POA: Insufficient documentation

## 2017-11-05 DIAGNOSIS — R600 Localized edema: Secondary | ICD-10-CM | POA: Diagnosis present

## 2017-11-05 DIAGNOSIS — M25511 Pain in right shoulder: Secondary | ICD-10-CM | POA: Diagnosis present

## 2017-11-05 DIAGNOSIS — R293 Abnormal posture: Secondary | ICD-10-CM | POA: Diagnosis present

## 2017-11-05 DIAGNOSIS — M6281 Muscle weakness (generalized): Secondary | ICD-10-CM | POA: Diagnosis present

## 2017-11-05 DIAGNOSIS — L599 Disorder of the skin and subcutaneous tissue related to radiation, unspecified: Secondary | ICD-10-CM | POA: Diagnosis present

## 2017-11-05 NOTE — Therapy (Signed)
[facility] [place], [place], [address] Phone: [phone]   Fax:  [phone]  Physical Therapy Treatment  Patient Details  Name: BRYANT LIPPS MRN: [id number] Date of Birth: 1956-06-08 Referring Provider: Dr. Rolm Bookbinder   Encounter Date: 11/05/2017  PT End of Session - 11/05/17 1304    Visit Number  12    Number of Visits  17    Date for PT Re-Evaluation  11/28/17    PT Start Time  1300    PT Stop Time  1347    PT Time Calculation (min)  47 min    Activity Tolerance  Patient tolerated treatment well    Behavior During Therapy  [facility] for tasks assessed/performed       Past Medical History:  Diagnosis Date  . Anxiety   . Breast cancer ([place])   . Cancer [facility])    breast cancer  . Depression   . Edema    bilateral feet and leg swelling  . GERD (gastroesophageal reflux disease)   . History of hiatal hernia   . History of radiation therapy 02/12/17- 03/26/17   Chest wall and regional nodes, right- 50 Gy in 25 fractions, Chest Wall, boost, right- 10 Gy in 5 fractions.   . Hypothyroidism   . Malaise   . Thyroid disease   . Varicose veins     Past Surgical History:  Procedure Laterality Date  . ABDOMINAL HYSTERECTOMY    . AXILLARY LYMPH NODE BIOPSY Right 01/03/2017   Procedure: RIGHT AXILLARY LYMPH NODE SEED GUIDED EXCISIONAL BIOPSY;  Surgeon: Rolm Bookbinder, MD;  Location: [place];  Service: General;  Laterality: Right;  . CHOLECYSTECTOMY    . COLONOSCOPY    . ESOPHAGEAL DILATION    . MASTECTOMY Right   . MASTECTOMY W/ SENTINEL NODE BIOPSY Right 01/03/2017   axillary  . MASTECTOMY W/ SENTINEL NODE BIOPSY Right 01/03/2017   Procedure: RIGHT TOTAL MASTECTOMY WITH RIGHT AXILLARY SENTINEL LYMPH NODE BIOPSY;  Surgeon: Rolm Bookbinder, MD;  Location: [place];  Service: General;  Laterality: Right;  . PORTACATH PLACEMENT Left 08/17/2016   Procedure: INSERTION PORT-A-CATH WITH [place];  Surgeon: Rolm Bookbinder, MD;   Location: [place];  Service: General;  Laterality: Left;    There were no vitals filed for this visit.  Subjective Assessment - 11/05/17 1302    Subjective  Patient reported feeling good; no soreness at the moment. Patient stated she was biking and gardening with some soreness over the weekend.     Pertinent History  Right breast cancer diagnosed; 08/16/16-12/01/16 completed chemotherapy except continued Herceptin until December. 01/03/17 Right mastectomy followed by radiation, completed 03/26/17. Reports neuropathy in hands (right>left) and in feet. Hypothyoid.     Patient Stated Goals  stop the pain    Currently in Pain?  No/denies                       [facility] Adult PT Treatment/Exercise - 11/05/17 0001      Exercises   Exercises  Shoulder      Shoulder Exercises: Standing   Extension  AAROM;Both;20 reps    Extension Limitations  with cane    Row  AROM;Both;20 reps    Row Limitations  Pink XTS      Shoulder Exercises: ROM/Strengthening   UBE (Upper Arm Bike)  120 RPM x8 min (4 min forward, 4 min backward)      Manual Therapy   Manual Therapy  Soft  tissue mobilization    Soft tissue mobilization  STW to R pectoralis, posterior shoulder and axilla with PROM to reduce muscle tightness and pain and improve ROM    Passive ROM  PROM to improve R flexion, abduction, ER  and IR with gentle over pressure                  PT Long Term Goals - 10/24/17 1117      PT LONG TERM GOAL #1   Title  Patient will be independent in home exercise program for right shoulder ROM, pect stretches, upper back strengthening.    Time  4    Period  Weeks    Status  Achieved      PT LONG TERM GOAL #2   Title  Right shoulder flexion to at least 140 degrees for improved overhead reach.    Baseline  142 degrees     Time  4    Period  Weeks    Status  Achieved      PT LONG TERM GOAL #3   Title  right shoulder abduction to at least 140 degrees for improved ADLs     Baseline  120    Time  4    Period  Weeks    Status  On-going      PT LONG TERM GOAL #4   Title  right shoulder horizontal abduction to at least 20 degrees    Baseline  12    Time  4    Period  Weeks    Status  On-going      PT LONG TERM GOAL #5   Title  patient will show at least 65 degrees of right shoulder external rotation for improved ADLs    Baseline  50 degrees at 90 degrees of abduction    Time  4    Period  Weeks    Status  On-going      PT LONG TERM GOAL #6   Title  Pt. will report at least 50% decrease overall in right shoulder pain (intensity and/or frequency)    Time  4    Period  Weeks    Status  Achieved      PT LONG TERM GOAL #7   Title  Quick DASH score will be reduced to 25 or less    Baseline  34.09 quck dash    Time  4    Period  Weeks    Status  On-going            Plan - 11/05/17 1356    Clinical Impression Statement  Patient was able to tolerate treatment well despite increased tightness along R axilla and mid back along latissimus dorsi muscle during flexion PROM. Patient educated on continuing to work on PROM and AROM exercises at home to improve motion for functional activities. Patient instructed to perform shoulder flexion stretch on counter top. Patient demonstrated good form after tactile cuing.     Clinical Presentation  Stable    Clinical Decision Making  Low    Rehab Potential  Good    Clinical Impairments Affecting Rehab Potential  h/o radiation to right chest probably limits the potential for normal soft tissue mobility in that area    PT Frequency  2x / week    PT Duration  4 weeks    PT Treatment/Interventions  ADLs/Self Care Home Management;Therapeutic exercise;Patient/family education;Manual techniques;Scar mobilization;Passive range of motion    PT Next Visit Plan  Assess form  with HEP, add postural exercises. Continue STW/M and passive ROM to improve function with ADLS.    Consulted and Agree with Plan of Care  Patient        Patient will benefit from skilled therapeutic intervention in order to improve the following deficits and impairments:  Decreased range of motion, Pain, Decreased scar mobility, Postural dysfunction, Impaired UE functional use  Visit Diagnosis: Stiffness of right shoulder, not elsewhere classified  Abnormal posture  Disorder of the skin and subcutaneous tissue related to radiation, unspecified  Aftercare following surgery for neoplasm  Right shoulder pain, unspecified chronicity     Problem List Patient Active Problem List   Diagnosis Date Noted  . Port catheter in place 09/08/2016  . Malignant neoplasm of upper-outer quadrant of right breast in female, estrogen receptor positive ([place]) 08/14/2016  . Breast cancer of upper-outer quadrant of right female breast ([place]) 08/13/2016  . Varicose veins of lower extremities with other complications 22/97/9892  . Hypothyroidism 04/24/2013   Gabriela Eves, PT, DPT 11/05/2017, 2:00 PM  [facility] [place], [place], [address] Phone: [phone]   Fax:  [phone]  Name: CAMELLA SEIM MRN: [id number] Date of Birth: Oct 07, 1955

## 2017-11-06 ENCOUNTER — Encounter: Payer: Self-pay | Admitting: Family Medicine

## 2017-11-06 ENCOUNTER — Ambulatory Visit: Payer: BLUE CROSS/BLUE SHIELD | Admitting: Family Medicine

## 2017-11-06 VITALS — BP 134/83 | HR 84 | Temp 97.2°F | Ht 67.0 in | Wt 174.0 lb

## 2017-11-06 DIAGNOSIS — H6982 Other specified disorders of Eustachian tube, left ear: Secondary | ICD-10-CM

## 2017-11-06 DIAGNOSIS — J301 Allergic rhinitis due to pollen: Secondary | ICD-10-CM | POA: Diagnosis not present

## 2017-11-06 MED ORDER — LORATADINE 10 MG PO TABS: 10.0000 mg | ORAL_TABLET | Freq: Every day | ORAL | 11 refills | Status: DC

## 2017-11-06 MED ORDER — AZELASTINE HCL 0.1 % NA SOLN: 1.0000 | Freq: Two times a day (BID) | NASAL | 12 refills | Status: DC

## 2017-11-06 NOTE — Progress Notes (Signed)
Subjective: CC: earache PCP: [name], MD NWG:NFAO B Perine is a 62 y.o. female presenting to clinic today for:  1. Earache Patient reports a 3-4-day history of left ear pain and pressure.  She notes she had associated dizziness yesterday.  Denies associated fevers, nausea, vomiting.  She does note sinus pressure.  She has been using Tylenol with little improvement in symptoms.  She thinks that symptoms started as a result of her mowing grass last week.  She notes that she typically gets "sinus symptoms" when she has done this.  Not currently on any oral antihistamines or nasal sprays.   ROS: Per HPI  Allergies  Allergen Reactions  . Ivp Dye [Iodinated Diagnostic Agents] Other (See Comments)    Chest pain  . Other Shortness Of Breath, Swelling and Other (See Comments)    Versed or Fentanyl when used for endoscopy caused throat and tongue swelling  . Dilaudid [Hydromorphone Hcl]     PT had CP and felt shaky. Didn't like the feeling, but sx lessened w/in 10 min of IV administration  . Asa [Aspirin] Other (See Comments)    spasms  . Codeine Other (See Comments)    spasms   Past Medical History:  Diagnosis Date  . Anxiety   . Breast cancer ([place])   . Cancer [facility])    breast cancer  . Depression   . Edema    bilateral feet and leg swelling  . GERD (gastroesophageal reflux disease)   . History of hiatal hernia   . History of radiation therapy 02/12/17- 03/26/17   Chest wall and regional nodes, right- 50 Gy in 25 fractions, Chest Wall, boost, right- 10 Gy in 5 fractions.   . Hypothyroidism   . Malaise   . Thyroid disease   . Varicose veins     Current Outpatient Medications:  .  anastrozole (ARIMIDEX) 1 MG tablet, Take 1 tablet (1 mg total) by mouth daily., Disp: 90 tablet, Rfl: 3 .  levothyroxine (SYNTHROID, LEVOTHROID) 150 MCG tablet, Take 1 tablet (150 mcg total) by mouth daily before breakfast., Disp: 90 tablet, Rfl: 1 Social History   Socioeconomic History  .  Marital status: Married    Spouse name: Not on file  . Number of children: Not on file  . Years of education: Not on file  . Highest education level: Not on file  Occupational History  . Not on file  Social Needs  . Financial resource strain: Not on file  . Food insecurity:    Worry: Not on file    Inability: Not on file  . Transportation needs:    Medical: Not on file    Non-medical: Not on file  Tobacco Use  . Smoking status: Current Every Day Smoker    Packs/day: 0.50    Years: 25.00    Pack years: 12.50    Types: Cigarettes    Start date: 04/19/1993  . Smokeless tobacco: Never Used  . Tobacco comment: she stopped smoking for a long time, but started back.   Substance and Sexual Activity  . Alcohol use: No  . Drug use: No  . Sexual activity: Not on file  Lifestyle  . Physical activity:    Days per week: Not on file    Minutes per session: Not on file  . Stress: Not on file  Relationships  . Social connections:    Talks on phone: Not on file    Gets together: Not on file    Attends  religious service: Not on file    Active member of club or organization: Not on file    Attends meetings of clubs or organizations: Not on file    Relationship status: Not on file  . Intimate partner violence:    Fear of current or ex partner: Not on file    Emotionally abused: Not on file    Physically abused: Not on file    Forced sexual activity: Not on file  Other Topics Concern  . Not on file  Social History Narrative  . Not on file   Family History  Problem Relation Age of Onset  . COPD Father   . Cancer Father     Objective: Office vital signs reviewed. BP 134/83   Pulse 84   Temp (!) 97.2 F (36.2 C) (Oral)   Ht 5\' 7"  (1.702 m)   Wt 174 lb (78.9 kg)   BMI 27.25 kg/m   Physical Examination:  General: Awake, alert, well nourished, nontoxic, No acute distress HEENT: Normal    Neck: No masses palpated. No lymphadenopathy    Ears: Tympanic membranes intact, normal  light reflex, no erythema, no bulging; left TM with appreciable fluid level that is nonpurulent.    Eyes: PERRLA, extraocular membranes intact, sclera white    Nose: nasal turbinates moist, no nasal discharge    Throat: moist mucus membranes, no erythema, no tonsillar exudate.  Airway is patent Cardio: regular rate and rhythm, S1S2 heard, no murmurs appreciated Pulm: clear to auscultation bilaterally, no wheezes, rhonchi or rales; normal work of breathing on room air  Assessment/ Plan: 62 y.o. female   1. Eustachian tube dysfunction, left Patient is well-appearing, afebrile with normal vital signs.  There was an appreciable left-sided fluid level behind the tympanic membrane that was nonpurulent.  No evidence of infection on today's exam.  I suspect that this is an allergy mediated poor eustachian tube drainage.  OMT maneuver for promotion of eustachian tube drainage demonstrates patient.  I also recommended that she start an oral antihistamine and nasal spray.  I have prescribed her Claritin and Astelin.  Home care instructions were reviewed and handout was provided.  Reasons for return evaluation discussed.  Patient voiced good understanding of follow-up as needed.  2. Non-seasonal allergic rhinitis due to pollen See above.   Meds ordered this encounter  Medications  . azelastine (ASTELIN) 0.1 % nasal spray    Sig: Place 1 spray into both nostrils 2 (two) times daily. Use in each nostril as directed    Dispense:  30 mL    Refill:  12  . loratadine (CLARITIN) 10 MG tablet    Sig: Take 1 tablet (10 mg total) by mouth daily.    Dispense:  30 tablet    Refill:  [place], [place] [phone]

## 2017-11-06 NOTE — Patient Instructions (Signed)
There is not any infection in your ear today.  I recommend that you continue the Tylenol every 8 hours as needed for pain.  Performed the massage technique we discussed.  I prescribed a nasal spray and an oral antihistamine to help with the allergies and drainage of your sinuses.  If symptoms worsen, you develop fevers, pus from your nose, worsening ear pain, please seek reevaluation.   Eustachian Tube Dysfunction The eustachian tube connects the middle ear to the back of the nose. It regulates air pressure in the middle ear by allowing air to move between the ear and nose. It also helps to drain fluid from the middle ear space. When the eustachian tube does not function properly, air pressure, fluid, or both can build up in the middle ear. Eustachian tube dysfunction can affect one or both ears. What are the causes? This condition happens when the eustachian tube becomes blocked or cannot open normally. This may result from:  Ear infections.  Colds and other upper respiratory infections.  Allergies.  Irritation, such as from cigarette smoke or acid from the stomach coming up into the esophagus (gastroesophageal reflux).  Sudden changes in air pressure, such as from descending in an airplane.  Abnormal growths in the nose or throat, such as nasal polyps, tumors, or enlarged tissue at the back of the throat (adenoids).  What increases the risk? This condition may be more likely to develop in people who smoke and people who are overweight. Eustachian tube dysfunction may also be more likely to develop in children, especially children who have:  Certain birth defects of the mouth, such as cleft palate.  Large tonsils and adenoids.  What are the signs or symptoms? Symptoms of this condition may include:  A feeling of fullness in the ear.  Ear pain.  Clicking or popping noises in the ear.  Ringing in the ear.  Hearing loss.  Loss of balance.  Symptoms may get worse when the air  pressure around you changes, such as when you travel to an area of high elevation or fly on an airplane. How is this diagnosed? This condition may be diagnosed based on:  Your symptoms.  A physical exam of your ear, nose, and throat.  Tests, such as those that measure: ? The movement of your eardrum (tympanogram). ? Your hearing (audiometry).  How is this treated? Treatment depends on the cause and severity of your condition. If your symptoms are mild, you may be able to relieve your symptoms by moving air into ("popping") your ears. If you have symptoms of fluid in your ears, treatment may include:  Decongestants.  Antihistamines.  Nasal sprays or ear drops that contain medicines that reduce swelling (steroids).  In some cases, you may need to have a procedure to drain the fluid in your eardrum (myringotomy). In this procedure, a small tube is placed in the eardrum to:  Drain the fluid.  Restore the air in the middle ear space.  Follow these instructions at home:  Take over-the-counter and prescription medicines only as told by your health care provider.  Use techniques to help pop your ears as recommended by your health care provider. These may include: ? Chewing gum. ? Yawning. ? Frequent, forceful swallowing. ? Closing your mouth, holding your nose closed, and gently blowing as if you are trying to blow air out of your nose.  Do not do any of the following until your health care provider approves: ? Travel to high altitudes. ? Fly  in airplanes. ? Work in a Pension scheme manager or room. ? Scuba dive.  Keep your ears dry. Dry your ears completely after showering or bathing.  Do not smoke.  Keep all follow-up visits as told by your health care provider. This is important. Contact a health care provider if:  Your symptoms do not go away after treatment.  Your symptoms come back after treatment.  You are unable to pop your ears.  You have: ? A fever. ? Pain in  your ear. ? Pain in your head or neck. ? Fluid draining from your ear.  Your hearing suddenly changes.  You become very dizzy.  You lose your balance. This information is not intended to replace advice given to you by your health care provider. Make sure you discuss any questions you have with your health care provider. Document Released: 08/20/2015 Document Revised: 12/30/2015 Document Reviewed: 08/12/2014 Elsevier Interactive Patient Education  2018 [employer]. Allergic Rhinitis, Adult Allergic rhinitis is an allergic reaction that affects the mucous membrane inside the nose. It causes sneezing, a runny or stuffy nose, and the feeling of mucus going down the back of the throat (postnasal drip). Allergic rhinitis can be mild to severe. There are two types of allergic rhinitis:  Seasonal. This type is also called hay fever. It happens only during certain seasons.  Perennial. This type can happen at any time of the year.  What are the causes? This condition happens when the body's defense system (immune system) responds to certain harmless substances called allergens as though they were germs.  Seasonal allergic rhinitis is triggered by pollen, which can come from grasses, trees, and weeds. Perennial allergic rhinitis may be caused by:  House dust mites.  Pet dander.  Mold spores.  What are the signs or symptoms? Symptoms of this condition include:  Sneezing.  Runny or stuffy nose (nasal congestion).  Postnasal drip.  Itchy nose.  Tearing of the eyes.  Trouble sleeping.  Daytime sleepiness.  How is this diagnosed? This condition may be diagnosed based on:  Your medical history.  A physical exam.  Tests to check for related conditions, such as: ? Asthma. ? Pink eye. ? Ear infection. ? Upper respiratory infection.  Tests to find out which allergens trigger your symptoms. These may include skin or blood tests.  How is this treated? There is no cure for  this condition, but treatment can help control symptoms. Treatment may include:  Taking medicines that block allergy symptoms, such as antihistamines. Medicine may be given as a shot, nasal spray, or pill.  Avoiding the allergen.  Desensitization. This treatment involves getting ongoing shots until your body becomes less sensitive to the allergen. This treatment may be done if other treatments do not help.  If taking medicine and avoiding the allergen does not work, new, stronger medicines may be prescribed.  Follow these instructions at home:  Find out what you are allergic to. Common allergens include smoke, dust, and pollen.  Avoid the things you are allergic to. These are some things you can do to help avoid allergens: ? Replace carpet with wood, tile, or vinyl flooring. Carpet can trap dander and dust. ? Do not smoke. Do not allow smoking in your home. ? Change your heating and air conditioning filter at least once a month. ? During allergy season:  Keep windows closed as much as possible.  Plan outdoor activities when pollen counts are lowest. This is usually during the evening hours.  When coming indoors,  change clothing and shower before sitting on furniture or bedding.  Take over-the-counter and prescription medicines only as told by your health care provider.  Keep all follow-up visits as told by your health care provider. This is important. Contact a health care provider if:  You have a fever.  You develop a persistent cough.  You make whistling sounds when you breathe (you wheeze).  Your symptoms interfere with your normal daily activities. Get help right away if:  You have shortness of breath. Summary  This condition can be managed by taking medicines as directed and avoiding allergens.  Contact your health care provider if you develop a persistent cough or fever.  During allergy season, keep windows closed as much as possible. This information is not  intended to replace advice given to you by your health care provider. Make sure you discuss any questions you have with your health care provider. Document Released: 04/18/2001 Document Revised: 08/31/2016 Document Reviewed: 08/31/2016 Elsevier Interactive Patient Education  [employer].

## 2017-11-07 ENCOUNTER — Ambulatory Visit: Payer: BLUE CROSS/BLUE SHIELD | Admitting: Physical Therapy

## 2017-11-07 ENCOUNTER — Encounter: Payer: Self-pay | Admitting: Physical Therapy

## 2017-11-07 DIAGNOSIS — R600 Localized edema: Secondary | ICD-10-CM

## 2017-11-07 DIAGNOSIS — M25611 Stiffness of right shoulder, not elsewhere classified: Secondary | ICD-10-CM

## 2017-11-07 DIAGNOSIS — M6281 Muscle weakness (generalized): Secondary | ICD-10-CM

## 2017-11-07 DIAGNOSIS — L599 Disorder of the skin and subcutaneous tissue related to radiation, unspecified: Secondary | ICD-10-CM

## 2017-11-07 DIAGNOSIS — R293 Abnormal posture: Secondary | ICD-10-CM

## 2017-11-07 DIAGNOSIS — Z483 Aftercare following surgery for neoplasm: Secondary | ICD-10-CM

## 2017-11-07 DIAGNOSIS — M25511 Pain in right shoulder: Secondary | ICD-10-CM

## 2017-11-07 NOTE — Therapy (Signed)
[facility] [place], [place], [address] Phone: [phone]   Fax:  [phone]  Physical Therapy Treatment  Patient Details  Name: Cynthia Chavez MRN: [id number] Date of Birth: 16-Feb-1956 Referring Provider: Dr. Rolm Bookbinder   Encounter Date: 11/07/2017  PT End of Session - 11/07/17 1120    Visit Number  13    Number of Visits  17    Date for PT Re-Evaluation  11/28/17    PT Start Time  1030    PT Stop Time  1115    PT Time Calculation (min)  45 min    Activity Tolerance  Patient tolerated treatment well    Behavior During Therapy  [facility] for tasks assessed/performed       Past Medical History:  Diagnosis Date  . Anxiety   . Breast cancer ([place])   . Cancer [facility])    breast cancer  . Depression   . Edema    bilateral feet and leg swelling  . GERD (gastroesophageal reflux disease)   . History of hiatal hernia   . History of radiation therapy 02/12/17- 03/26/17   Chest wall and regional nodes, right- 50 Gy in 25 fractions, Chest Wall, boost, right- 10 Gy in 5 fractions.   . Hypothyroidism   . Malaise   . Thyroid disease   . Varicose veins     Past Surgical History:  Procedure Laterality Date  . ABDOMINAL HYSTERECTOMY    . AXILLARY LYMPH NODE BIOPSY Right 01/03/2017   Procedure: RIGHT AXILLARY LYMPH NODE SEED GUIDED EXCISIONAL BIOPSY;  Surgeon: Rolm Bookbinder, MD;  Location: [place];  Service: General;  Laterality: Right;  . CHOLECYSTECTOMY    . COLONOSCOPY    . ESOPHAGEAL DILATION    . MASTECTOMY Right   . MASTECTOMY W/ SENTINEL NODE BIOPSY Right 01/03/2017   axillary  . MASTECTOMY W/ SENTINEL NODE BIOPSY Right 01/03/2017   Procedure: RIGHT TOTAL MASTECTOMY WITH RIGHT AXILLARY SENTINEL LYMPH NODE BIOPSY;  Surgeon: Rolm Bookbinder, MD;  Location: [place];  Service: General;  Laterality: Right;  . PORTACATH PLACEMENT Left 08/17/2016   Procedure: INSERTION PORT-A-CATH WITH [place];  Surgeon: Rolm Bookbinder, MD;   Location: [place];  Service: General;  Laterality: Left;    There were no vitals filed for this visit.  Subjective Assessment - 11/07/17 1243    Subjective  Patient stated her husband massaged R axilla last night too strong and she is sore today.    Pertinent History  Right breast cancer diagnosed; 08/16/16-12/01/16 completed chemotherapy except continued Herceptin until December. 01/03/17 Right mastectomy followed by radiation, completed 03/26/17. Reports neuropathy in hands (right>left) and in feet. Hypothyoid.     Patient Stated Goals  stop the pain    Pain Score  3     Pain Location  Shoulder    Pain Orientation  Right    Pain Descriptors / Indicators  Sore    Pain Onset  More than a month ago         [facility] PT Assessment - 11/07/17 0001      Assessment   Medical Diagnosis  right breast cancer s/p chemo, mastectomy and radiation      Precautions   Precautions  Other (comment)    Precaution Comments  cancer precautions: avoid modalities to the right upper quadrant other than possibly electrical stimulation or iontophoresis if warranted                   [facility]  Adult PT Treatment/Exercise - 11/07/17 0001      Exercises   Exercises  Shoulder      Shoulder Exercises: Prone   Retraction  AROM;Right;20 reps;10 reps    Extension  AROM;Strengthening;Right;20 reps;10 reps    Horizontal ABduction 1  AROM;20 reps      Shoulder Exercises: Standing   Other Standing Exercises  wall wash into flexion x20      Shoulder Exercises: ROM/Strengthening   UBE (Upper Arm Bike)  90 RPM x8 min (4 min forward, 4 min backward)      Manual Therapy   Manual Therapy  Soft tissue mobilization    Soft tissue mobilization  STW to R pectoralis, posterior shoulder and axilla with PROM to reduce muscle tightness and pain and improve ROM    Passive ROM  PROM to improve R flexion, abduction, ER  and IR with gentle over pressure                  PT Long Term Goals -  10/24/17 1117      PT LONG TERM GOAL #1   Title  Patient will be independent in home exercise program for right shoulder ROM, pect stretches, upper back strengthening.    Time  4    Period  Weeks    Status  Achieved      PT LONG TERM GOAL #2   Title  Right shoulder flexion to at least 140 degrees for improved overhead reach.    Baseline  142 degrees     Time  4    Period  Weeks    Status  Achieved      PT LONG TERM GOAL #3   Title  right shoulder abduction to at least 140 degrees for improved ADLs    Baseline  120    Time  4    Period  Weeks    Status  On-going      PT LONG TERM GOAL #4   Title  right shoulder horizontal abduction to at least 20 degrees    Baseline  12    Time  4    Period  Weeks    Status  On-going      PT LONG TERM GOAL #5   Title  patient will show at least 65 degrees of right shoulder external rotation for improved ADLs    Baseline  50 degrees at 90 degrees of abduction    Time  4    Period  Weeks    Status  On-going      PT LONG TERM GOAL #6   Title  Pt. will report at least 50% decrease overall in right shoulder pain (intensity and/or frequency)    Time  4    Period  Weeks    Status  Achieved      PT LONG TERM GOAL #7   Title  Quick DASH score will be reduced to 25 or less    Baseline  34.09 quck dash    Time  4    Period  Weeks    Status  On-going            Plan - 11/07/17 1247    Clinical Impression Statement  Patient was able to complete treatment despite increased tightness in R axilla. Patient's R should flexion PROM is improving however AROM flexion not equal to PROM secondary to tightness. Patient noted with improvements with R ABD with minimal pain in at "top of the shoulder" and  at R axilla. Attempt IASTM to R axilla next visit.    Clinical Presentation  Stable    Clinical Decision Making  Low    Rehab Potential  Good    Clinical Impairments Affecting Rehab Potential  h/o radiation to right chest probably limits the  potential for normal soft tissue mobility in that area    PT Duration  4 weeks    PT Treatment/Interventions  ADLs/Self Care Home Management;Therapeutic exercise;Patient/family education;Manual techniques;Scar mobilization;Passive range of motion    PT Next Visit Plan  Assess form with HEP, add postural exercises. Continue STW/M and passive ROM to improve function with ADLS.    Consulted and Agree with Plan of Care  Patient       Patient will benefit from skilled therapeutic intervention in order to improve the following deficits and impairments:  Decreased range of motion, Pain, Decreased scar mobility, Postural dysfunction, Impaired UE functional use  Visit Diagnosis: Stiffness of right shoulder, not elsewhere classified  Abnormal posture  Disorder of the skin and subcutaneous tissue related to radiation, unspecified  Aftercare following surgery for neoplasm  Right shoulder pain, unspecified chronicity  Acute pain of right shoulder  Muscle weakness (generalized)  Localized edema     Problem List Patient Active Problem List   Diagnosis Date Noted  . Port catheter in place 09/08/2016  . Malignant neoplasm of upper-outer quadrant of right breast in female, estrogen receptor positive ([place]) 08/14/2016  . Breast cancer of upper-outer quadrant of right female breast ([place]) 08/13/2016  . Varicose veins of lower extremities with other complications 54/65/0354  . Hypothyroidism 04/24/2013   Gabriela Eves, PT, DPT 11/07/2017, 12:53 PM  [facility] [address] [place], [place], [address] Phone: [phone]   Fax:  [phone]  Name: IMOGEAN CIAMPA MRN: [id number] Date of Birth: 1956-05-11

## 2017-11-12 ENCOUNTER — Ambulatory Visit: Payer: BLUE CROSS/BLUE SHIELD | Admitting: Physical Therapy

## 2017-11-12 DIAGNOSIS — Z483 Aftercare following surgery for neoplasm: Secondary | ICD-10-CM

## 2017-11-12 DIAGNOSIS — R600 Localized edema: Secondary | ICD-10-CM

## 2017-11-12 DIAGNOSIS — R293 Abnormal posture: Secondary | ICD-10-CM

## 2017-11-12 DIAGNOSIS — M25611 Stiffness of right shoulder, not elsewhere classified: Secondary | ICD-10-CM | POA: Diagnosis not present

## 2017-11-12 DIAGNOSIS — L599 Disorder of the skin and subcutaneous tissue related to radiation, unspecified: Secondary | ICD-10-CM

## 2017-11-12 DIAGNOSIS — M6281 Muscle weakness (generalized): Secondary | ICD-10-CM

## 2017-11-12 DIAGNOSIS — M25511 Pain in right shoulder: Secondary | ICD-10-CM

## 2017-11-12 NOTE — Therapy (Signed)
[facility] [place], [place], [address] Phone: [phone]   Fax:  [phone]  Physical Therapy Treatment  Patient Details  Name: Cynthia Chavez MRN: [id number] Date of Birth: November 26, 1955 Referring Provider: Dr. Rolm Bookbinder   Encounter Date: 11/12/2017  PT End of Session - 11/12/17 1302    Visit Number  14    Number of Visits  17    Date for PT Re-Evaluation  11/28/17    PT Start Time  1300    PT Stop Time  1347    PT Time Calculation (min)  47 min    Activity Tolerance  Patient tolerated treatment well    Behavior During Therapy  [facility] for tasks assessed/performed       Past Medical History:  Diagnosis Date  . Anxiety   . Breast cancer ([place])   . Cancer [facility])    breast cancer  . Depression   . Edema    bilateral feet and leg swelling  . GERD (gastroesophageal reflux disease)   . History of hiatal hernia   . History of radiation therapy 02/12/17- 03/26/17   Chest wall and regional nodes, right- 50 Gy in 25 fractions, Chest Wall, boost, right- 10 Gy in 5 fractions.   . Hypothyroidism   . Malaise   . Thyroid disease   . Varicose veins     Past Surgical History:  Procedure Laterality Date  . ABDOMINAL HYSTERECTOMY    . AXILLARY LYMPH NODE BIOPSY Right 01/03/2017   Procedure: RIGHT AXILLARY LYMPH NODE SEED GUIDED EXCISIONAL BIOPSY;  Surgeon: Rolm Bookbinder, MD;  Location: [place];  Service: General;  Laterality: Right;  . CHOLECYSTECTOMY    . COLONOSCOPY    . ESOPHAGEAL DILATION    . MASTECTOMY Right   . MASTECTOMY W/ SENTINEL NODE BIOPSY Right 01/03/2017   axillary  . MASTECTOMY W/ SENTINEL NODE BIOPSY Right 01/03/2017   Procedure: RIGHT TOTAL MASTECTOMY WITH RIGHT AXILLARY SENTINEL LYMPH NODE BIOPSY;  Surgeon: Rolm Bookbinder, MD;  Location: [place];  Service: General;  Laterality: Right;  . PORTACATH PLACEMENT Left 08/17/2016   Procedure: INSERTION PORT-A-CATH WITH [place];  Surgeon: Rolm Bookbinder, MD;   Location: [place];  Service: General;  Laterality: Left;    There were no vitals filed for this visit.  Subjective Assessment - 11/12/17 1305    Subjective  Patient reported a great improvement in her Right AROM. She states she still feels the grabbing but its not as frequent.    Pertinent History  Right breast cancer diagnosed; 08/16/16-12/01/16 completed chemotherapy except continued Herceptin until December. 01/03/17 Right mastectomy followed by radiation, completed 03/26/17. Reports neuropathy in hands (right>left) and in feet. Hypothyoid.     Patient Stated Goals  stop the pain         [facility] PT Assessment - 11/12/17 0001      Assessment   Medical Diagnosis  right breast cancer s/p chemo, mastectomy and radiation      Precautions   Precautions  Other (comment)    Precaution Comments  cancer precautions: avoid modalities to the right upper quadrant other than possibly electrical stimulation or iontophoresis if warranted                   [facility] Adult PT Treatment/Exercise - 11/12/17 0001      Exercises   Exercises  Shoulder      Shoulder Exercises: Standing   External Rotation  Strengthening;Right;20 reps    Theraband Level (  Shoulder External Rotation)  Level 1 (Yellow)    Row  Strengthening;Both until fatigue    Row Limitations  Pink XTS      Shoulder Exercises: ROM/Strengthening   UBE (Upper Arm Bike)  90 RPM x8 min (4 min forward, 4 min backward)      Manual Therapy   Manual Therapy  Soft tissue mobilization    Joint Mobilization  Grade III shoulder posterior mobilization and grade III distraction to improve joint mobility and ROM    Soft tissue mobilization  IASTM to taut band in R axilla followed by STW to R pectoralis, posterior shoulder and axilla with PROM to reduce muscle tightness and pain and improve ROM    Passive ROM  PROM to improve R flexion, abduction, ER  and IR with gentle over pressure followed by rhythmic stabilizations 2x30  seconds at 90 and 120 degrees                  PT Long Term Goals - 10/24/17 1117      PT LONG TERM GOAL #1   Title  Patient will be independent in home exercise program for right shoulder ROM, pect stretches, upper back strengthening.    Time  4    Period  Weeks    Status  Achieved      PT LONG TERM GOAL #2   Title  Right shoulder flexion to at least 140 degrees for improved overhead reach.    Baseline  142 degrees     Time  4    Period  Weeks    Status  Achieved      PT LONG TERM GOAL #3   Title  right shoulder abduction to at least 140 degrees for improved ADLs    Baseline  120    Time  4    Period  Weeks    Status  On-going      PT LONG TERM GOAL #4   Title  right shoulder horizontal abduction to at least 20 degrees    Baseline  12    Time  4    Period  Weeks    Status  On-going      PT LONG TERM GOAL #5   Title  patient will show at least 65 degrees of right shoulder external rotation for improved ADLs    Baseline  50 degrees at 90 degrees of abduction    Time  4    Period  Weeks    Status  On-going      PT LONG TERM GOAL #6   Title  Pt. will report at least 50% decrease overall in right shoulder pain (intensity and/or frequency)    Time  4    Period  Weeks    Status  Achieved      PT LONG TERM GOAL #7   Title  Quick DASH score will be reduced to 25 or less    Baseline  34.09 quck dash    Time  4    Period  Weeks    Status  On-going            Plan - 11/12/17 1752    Clinical Impression Statement  Patient demonstrated improved ROM  as noted by ability to perform R shoulder flexion, R shoulder abduction to equal L with no reports of "pulling" in right axilla until end range. Patient noted with improved smooth arc of motion during PROM. Patient was able to tolerate IASTM to taut band  in R axilla with normal tissue responses. Patient educated there may be residual redness and possible bruising secondary to IASTM. Patient reported  understanding.     Clinical Presentation  Stable    Clinical Decision Making  Low    Rehab Potential  Good    Clinical Impairments Affecting Rehab Potential  h/o radiation to right chest probably limits the potential for normal soft tissue mobility in that area    PT Frequency  2x / week    PT Duration  4 weeks    PT Treatment/Interventions  ADLs/Self Care Home Management;Therapeutic exercise;Patient/family education;Manual techniques;Scar mobilization;Passive range of motion    PT Next Visit Plan  Continue exercises to improve strength and ROM. Continue IASTM and STW/M, PROM to improve function with ADLs    Consulted and Agree with Plan of Care  Patient       Patient will benefit from skilled therapeutic intervention in order to improve the following deficits and impairments:  Decreased range of motion, Pain, Decreased scar mobility, Postural dysfunction, Impaired UE functional use  Visit Diagnosis: Stiffness of right shoulder, not elsewhere classified  Abnormal posture  Disorder of the skin and subcutaneous tissue related to radiation, unspecified  Aftercare following surgery for neoplasm  Right shoulder pain, unspecified chronicity  Acute pain of right shoulder  Muscle weakness (generalized)  Localized edema     Problem List Patient Active Problem List   Diagnosis Date Noted  . Port catheter in place 09/08/2016  . Malignant neoplasm of upper-outer quadrant of right breast in female, estrogen receptor positive ([place]) 08/14/2016  . Breast cancer of upper-outer quadrant of right female breast ([place]) 08/13/2016  . Varicose veins of lower extremities with other complications 88/50/2774  . Hypothyroidism 04/24/2013   Gabriela Eves, PT, DPT 11/12/2017, 5:56 PM  [facility] [address] [place], [place], [address] Phone: [phone]   Fax:  [phone]  Name: HATTYE SIEGFRIED MRN: [id number] Date of Birth: 1955-12-29

## 2017-11-14 ENCOUNTER — Ambulatory Visit: Payer: BLUE CROSS/BLUE SHIELD | Admitting: Physical Therapy

## 2017-11-14 DIAGNOSIS — Z483 Aftercare following surgery for neoplasm: Secondary | ICD-10-CM

## 2017-11-14 DIAGNOSIS — M25511 Pain in right shoulder: Secondary | ICD-10-CM

## 2017-11-14 DIAGNOSIS — M25611 Stiffness of right shoulder, not elsewhere classified: Secondary | ICD-10-CM

## 2017-11-14 DIAGNOSIS — L599 Disorder of the skin and subcutaneous tissue related to radiation, unspecified: Secondary | ICD-10-CM

## 2017-11-14 DIAGNOSIS — R293 Abnormal posture: Secondary | ICD-10-CM

## 2017-11-14 DIAGNOSIS — R600 Localized edema: Secondary | ICD-10-CM

## 2017-11-14 DIAGNOSIS — M6281 Muscle weakness (generalized): Secondary | ICD-10-CM

## 2017-11-14 NOTE — Therapy (Signed)
[facility] [place], [place], [address] Phone: [phone]   Fax:  [phone]  Physical Therapy Treatment  Patient Details  Name: Cynthia Chavez MRN: [id number] Date of Birth: [birth year] Referring Provider: Dr. Rolm Bookbinder   Encounter Date: 11/14/2017  PT End of Session - 11/14/17 1343    Visit Number  15    Number of Visits  17    Date for PT Re-Evaluation  11/28/17    PT Start Time  1300    PT Stop Time  1347    PT Time Calculation (min)  47 min    Activity Tolerance  Patient tolerated treatment well    Behavior During Therapy  [facility] for tasks assessed/performed       Past Medical History:  Diagnosis Date  . Anxiety   . Breast cancer ([place])   . Cancer [facility])    breast cancer  . Depression   . Edema    bilateral feet and leg swelling  . GERD (gastroesophageal reflux disease)   . History of hiatal hernia   . History of radiation therapy 02/12/17- 03/26/17   Chest wall and regional nodes, right- 50 Gy in 25 fractions, Chest Wall, boost, right- 10 Gy in 5 fractions.   . Hypothyroidism   . Malaise   . Thyroid disease   . Varicose veins     Past Surgical History:  Procedure Laterality Date  . ABDOMINAL HYSTERECTOMY    . AXILLARY LYMPH NODE BIOPSY Right 01/03/2017   Procedure: RIGHT AXILLARY LYMPH NODE SEED GUIDED EXCISIONAL BIOPSY;  Surgeon: Rolm Bookbinder, MD;  Location: [place];  Service: General;  Laterality: Right;  . CHOLECYSTECTOMY    . COLONOSCOPY    . ESOPHAGEAL DILATION    . MASTECTOMY Right   . MASTECTOMY W/ SENTINEL NODE BIOPSY Right 01/03/2017   axillary  . MASTECTOMY W/ SENTINEL NODE BIOPSY Right 01/03/2017   Procedure: RIGHT TOTAL MASTECTOMY WITH RIGHT AXILLARY SENTINEL LYMPH NODE BIOPSY;  Surgeon: Rolm Bookbinder, MD;  Location: [place];  Service: General;  Laterality: Right;  . PORTACATH PLACEMENT Left 08/17/2016   Procedure: INSERTION PORT-A-CATH WITH [place];  Surgeon: Rolm Bookbinder, MD;   Location: [place];  Service: General;  Laterality: Left;    There were no vitals filed for this visit.  Subjective Assessment - 11/14/17 1401    Subjective  Patient reported no new complaints.    Pertinent History  Right breast cancer diagnosed; 08/16/16-12/01/16 completed chemotherapy except continued Herceptin until December. 01/03/17 Right mastectomy followed by radiation, completed 03/26/17. Reports neuropathy in hands (right>left) and in feet. Hypothyoid.     Patient Stated Goals  stop the pain    Currently in Pain?  No/denies         [facility] PT Assessment - 11/14/17 0001      Assessment   Medical Diagnosis  right breast cancer s/p chemo, mastectomy and radiation      Precautions   Precautions  Other (comment)    Precaution Comments  cancer precautions: avoid modalities to the right upper quadrant other than possibly electrical stimulation or iontophoresis if warranted           Quick Dash - 11/14/17 0001    Open a tight or new jar  Mild difficulty    Do heavy household chores (wash walls, wash floors)  Mild difficulty    Carry a shopping bag or briefcase  Mild difficulty    Wash your back  Mild difficulty  Use a knife to cut food  No difficulty    Recreational activities in which you take some force or impact through your arm, shoulder, or hand (golf, hammering, tennis)  Moderate difficulty    During the past week, to what extent has your arm, shoulder or hand problem interfered with your normal social activities with family, friends, neighbors, or groups?  Slightly    During the past week, to what extent has your arm, shoulder or hand problem limited your work or other regular daily activities  Slightly    Arm, shoulder, or hand pain.  Mild    Tingling (pins and needles) in your arm, shoulder, or hand  Mild    Difficulty Sleeping  Mild difficulty    DASH Score  25 %             [facility] Adult PT Treatment/Exercise - 11/14/17 0001      Exercises    Exercises  Shoulder      Shoulder Exercises: Standing   Row  Strengthening;Both;10 reps;20 reps    Row Limitations  Pink XTS    Other Standing Exercises  behind the back horizontal adduction 2x10      Shoulder Exercises: ROM/Strengthening   UBE (Upper Arm Bike)  90 RPM x8 min (4 min forward, 4 min backward)    Wall Pushups  10 reps      Manual Therapy   Manual Therapy  Soft tissue mobilization    Soft tissue mobilization  IASTM to taut band in R axilla followed by STW to R pectoralis, posterior shoulder and axilla with PROM to reduce muscle tightness and pain and improve ROM    Passive ROM  PROM to improve R flexion, abduction, ER ,IR, and horizontal abd with gentle over pressure to improve ROM                   PT Long Term Goals - 11/14/17 1349      PT LONG TERM GOAL #1   Title  Patient will be independent in home exercise program for right shoulder ROM, pect stretches, upper back strengthening.    Time  4    Period  Weeks    Status  Achieved      PT LONG TERM GOAL #2   Title  Right shoulder flexion to at least 140 degrees for improved overhead reach.    Baseline  142 degrees     Time  4    Period  Weeks    Status  Achieved      PT LONG TERM GOAL #3   Title  right shoulder abduction to at least 140 degrees for improved ADLs    Baseline  120    Time  4    Period  Weeks    Status  On-going      PT LONG TERM GOAL #4   Title  right shoulder horizontal abduction to at least 20 degrees    Baseline  12    Time  4    Period  Weeks    Status  On-going      PT LONG TERM GOAL #5   Title  patient will show at least 65 degrees of right shoulder external rotation for improved ADLs    Baseline  50 degrees at 90 degrees of abduction    Time  4    Period  Weeks    Status  On-going      PT LONG TERM GOAL #6  Title  Pt. will report at least 50% decrease overall in right shoulder pain (intensity and/or frequency)    Baseline  up to 10/10 at times    Time  4     Period  Weeks    Status  Achieved      PT LONG TERM GOAL #7   Title  Quick DASH score will be reduced to 25 or less    Time  4    Period  Weeks    Status  -- 25 on DASH            Plan - 11/14/17 1356    Clinical Impression Statement  Patient able to complete exercises with no reports of pain. Patient reported increased R axilla tension and "pulling" with end range PROM horizontal abduction. Patient and PT discussed reducing frequency to 1x per week for 2 weeks then reassess if patient needs more visits. Patient reported agreement. Patient achieved DASH goal, 25.     Clinical Presentation  Stable    Clinical Decision Making  Low    Rehab Potential  Good    Clinical Impairments Affecting Rehab Potential  h/o radiation to right chest probably limits the potential for normal soft tissue mobility in that area    PT Frequency  2x / week    PT Duration  4 weeks    PT Treatment/Interventions  ADLs/Self Care Home Management;Therapeutic exercise;Patient/family education;Manual techniques;Scar mobilization;Passive range of motion    PT Next Visit Plan  Assess AROM. Continue exercises to improve strength and ROM. Continue IASTM and STW/M, PROM to improve function with ADLs    Consulted and Agree with Plan of Care  Patient       Patient will benefit from skilled therapeutic intervention in order to improve the following deficits and impairments:  Decreased range of motion, Pain, Decreased scar mobility, Postural dysfunction, Impaired UE functional use  Visit Diagnosis: Abnormal posture  Stiffness of right shoulder, not elsewhere classified  Disorder of the skin and subcutaneous tissue related to radiation, unspecified  Aftercare following surgery for neoplasm  Right shoulder pain, unspecified chronicity  Acute pain of right shoulder  Muscle weakness (generalized)  Localized edema     Problem List Patient Active Problem List   Diagnosis Date Noted  . Port catheter in place  09/08/2016  . Malignant neoplasm of upper-outer quadrant of right breast in female, estrogen receptor positive ([place]) 08/14/2016  . Breast cancer of upper-outer quadrant of right female breast ([place]) 08/13/2016  . Varicose veins of lower extremities with other complications 81/19/1478  . Hypothyroidism 04/24/2013   Gabriela Eves, PT, DPT  11/14/2017, 2:02 PM  [facility] [address] [place], [place], [address] Phone: [phone]   Fax:  [phone]  Name: RANEEM MENDOLIA MRN: [id number] Date of Birth: [birth year]

## 2017-11-15 ENCOUNTER — Ambulatory Visit: Payer: BLUE CROSS/BLUE SHIELD | Admitting: Pediatrics

## 2017-11-15 ENCOUNTER — Encounter: Payer: Self-pay | Admitting: Pediatrics

## 2017-11-15 ENCOUNTER — Ambulatory Visit: Payer: BLUE CROSS/BLUE SHIELD | Admitting: Family

## 2017-11-15 VITALS — BP 132/82 | HR 83 | Temp 97.4°F | Ht 67.0 in | Wt 177.0 lb

## 2017-11-15 DIAGNOSIS — H65112 Acute and subacute allergic otitis media (mucoid) (sanguinous) (serous), left ear: Secondary | ICD-10-CM | POA: Diagnosis not present

## 2017-11-15 DIAGNOSIS — E039 Hypothyroidism, unspecified: Secondary | ICD-10-CM | POA: Diagnosis not present

## 2017-11-15 DIAGNOSIS — B379 Candidiasis, unspecified: Secondary | ICD-10-CM | POA: Diagnosis not present

## 2017-11-15 MED ORDER — FLUCONAZOLE 150 MG PO TABS: 150.0000 mg | ORAL_TABLET | ORAL | 0 refills | Status: DC | PRN

## 2017-11-15 MED ORDER — AZITHROMYCIN 250 MG PO TABS: ORAL_TABLET | ORAL | 0 refills | Status: DC

## 2017-11-15 MED ORDER — CIPROFLOXACIN-DEXAMETHASONE 0.3-0.1 % OT SUSP: 4.0000 [drp] | Freq: Two times a day (BID) | OTIC | 0 refills | Status: DC

## 2017-11-15 NOTE — Progress Notes (Signed)
  Subjective:   Patient ID: Cynthia Chavez, female    DOB: 1955-10-10, 62 y.o.   MRN: [id number] CC: Ear Pain (Left)  HPI: Cynthia Chavez is a 62 y.o. female presenting for Ear Pain (Left)  Seen a week and a half ago for eustachian tube dysfunction left side.  Pain is gotten worse since then.  She has been taking the Claritin and using the nose spray.  No sore throat.  She thinks the ear pain started when her allergies started and she had a lot of congestion.  The congestion is still ongoing but somewhat better.  Ear pain is gotten worse.  Left ear hurts, feels swollen into the left side of her neck.  Her appetite has been okay.  Not able take ibuprofen because it hurts her stomach.  Hypothyroidism: Due for recheck TSH.  Relevant past medical, surgical, family and social history reviewed. Allergies and medications reviewed and updated. Social History   Tobacco Use  Smoking Status Current Every Day Smoker  . Packs/day: 0.50  . Years: 25.00  . Pack years: 12.50  . Types: Cigarettes  . Start date: 04/19/1993  Smokeless Tobacco Never Used  Tobacco Comment   she stopped smoking for a long time, but started back.    ROS: Per HPI   Objective:    BP 132/82   Pulse 83   Temp (!) 97.4 F (36.3 C) (Oral)   Ht 5\' 7"  (1.702 m)   Wt 177 lb (80.3 kg)   BMI 27.72 kg/m   Wt Readings from Last 3 Encounters:  11/15/17 177 lb (80.3 kg)  11/06/17 174 lb (78.9 kg)  10/04/17 175 lb 12.8 oz (79.7 kg)    Gen: NAD, alert, cooperative with exam, NCAT EYES: EOMI, no conjunctival injection, or no icterus ENT: Right TM opaque with white fluid, a couple air bubbles.  Left TM with some redness, opaque with white fluid, ear canal swollen. OP without erythema LYMPH: no cervical LAD CV: NRRR, normal S1/S2, no murmur, distal pulses 2+ b/l Resp: CTABL, no wheezes, normal WOB Ext: No edema, warm Neuro: Alert and oriented, strength equal b/l UE and LE, coordination grossly normal MSK: normal muscle  bulk  Assessment & Plan:  Cynthia Chavez was seen today for ear pain.  Diagnoses and all orders for this visit:  Hypothyroidism, unspecified type Continue replacement, will recheck TSH -     TSH  Yeast infection -     fluconazole (DIFLUCAN) 150 MG tablet; Take 1 tablet (150 mg total) by mouth every three (3) days as needed.  Acute mucoid otitis media of left ear -     azithromycin (ZITHROMAX) 250 MG tablet; Take 2 the first day and then one each day after. -     ciprofloxacin-dexamethasone (CIPRODEX) OTIC suspension; Place 4 drops into the left ear 2 (two) times daily.   Follow up plan: Return in about 3 months (around 02/14/2018). Cynthia Found, MD [place]

## 2017-11-16 LAB — TSH: TSH: 1.04 u[IU]/mL (ref 0.450–4.500)

## 2017-11-20 ENCOUNTER — Ambulatory Visit: Payer: BLUE CROSS/BLUE SHIELD | Admitting: Physical Therapy

## 2017-11-20 DIAGNOSIS — M25611 Stiffness of right shoulder, not elsewhere classified: Secondary | ICD-10-CM

## 2017-11-20 DIAGNOSIS — Z483 Aftercare following surgery for neoplasm: Secondary | ICD-10-CM

## 2017-11-20 DIAGNOSIS — R600 Localized edema: Secondary | ICD-10-CM

## 2017-11-20 DIAGNOSIS — M6281 Muscle weakness (generalized): Secondary | ICD-10-CM

## 2017-11-20 DIAGNOSIS — L599 Disorder of the skin and subcutaneous tissue related to radiation, unspecified: Secondary | ICD-10-CM

## 2017-11-20 DIAGNOSIS — M25511 Pain in right shoulder: Secondary | ICD-10-CM

## 2017-11-20 DIAGNOSIS — R293 Abnormal posture: Secondary | ICD-10-CM

## 2017-11-20 NOTE — Therapy (Signed)
[facility] [place], [place], [address] Phone: [phone]   Fax:  [phone]  Physical Therapy Treatment  Patient Details  Name: Cynthia Chavez MRN: [id number] Date of Birth: [birth year] Referring Provider: Dr. Rolm Bookbinder   Encounter Date: 11/20/2017  PT End of Session - 11/20/17 1303    Visit Number  16    Number of Visits  17    Date for PT Re-Evaluation  11/28/17    PT Start Time  1300    PT Stop Time  1346    PT Time Calculation (min)  46 min    Activity Tolerance  Patient tolerated treatment well    Behavior During Therapy  [facility] for tasks assessed/performed       Past Medical History:  Diagnosis Date  . Anxiety   . Breast cancer ([place])   . Cancer [facility])    breast cancer  . Depression   . Edema    bilateral feet and leg swelling  . GERD (gastroesophageal reflux disease)   . History of hiatal hernia   . History of radiation therapy 02/12/17- 03/26/17   Chest wall and regional nodes, right- 50 Gy in 25 fractions, Chest Wall, boost, right- 10 Gy in 5 fractions.   . Hypothyroidism   . Malaise   . Thyroid disease   . Varicose veins     Past Surgical History:  Procedure Laterality Date  . ABDOMINAL HYSTERECTOMY    . AXILLARY LYMPH NODE BIOPSY Right 01/03/2017   Procedure: RIGHT AXILLARY LYMPH NODE SEED GUIDED EXCISIONAL BIOPSY;  Surgeon: Rolm Bookbinder, MD;  Location: [place];  Service: General;  Laterality: Right;  . CHOLECYSTECTOMY    . COLONOSCOPY    . ESOPHAGEAL DILATION    . MASTECTOMY Right   . MASTECTOMY W/ SENTINEL NODE BIOPSY Right 01/03/2017   axillary  . MASTECTOMY W/ SENTINEL NODE BIOPSY Right 01/03/2017   Procedure: RIGHT TOTAL MASTECTOMY WITH RIGHT AXILLARY SENTINEL LYMPH NODE BIOPSY;  Surgeon: Rolm Bookbinder, MD;  Location: [place];  Service: General;  Laterality: Right;  . PORTACATH PLACEMENT Left 08/17/2016   Procedure: INSERTION PORT-A-CATH WITH [place];  Surgeon: Rolm Bookbinder, MD;   Location: [place];  Service: General;  Laterality: Left;    There were no vitals filed for this visit.  Subjective Assessment - 11/20/17 1304    Subjective  Patient reported feeling "good." She was able to mow the lawn for 3 hours with just muscle fatigue. She states she still has the grabbing sensation but it is not a frequent.     Pertinent History  Right breast cancer diagnosed; 08/16/16-12/01/16 completed chemotherapy except continued Herceptin until December. 01/03/17 Right mastectomy followed by radiation, completed 03/26/17. Reports neuropathy in hands (right>left) and in feet. Hypothyoid.     Patient Stated Goals  stop the pain    Currently in Pain?  No/denies         [facility] PT Assessment - 11/20/17 0001      Assessment   Medical Diagnosis  right breast cancer s/p chemo, mastectomy and radiation      Precautions   Precautions  Other (comment)    Precaution Comments  cancer precautions: avoid modalities to the right upper quadrant other than possibly electrical stimulation or iontophoresis if warranted      AROM   Overall AROM   Within functional limits for tasks performed    Right Shoulder Flexion  159 Degrees    Right Shoulder ABduction  142 Degrees  Right Shoulder Internal Rotation  70 Degrees    Right Shoulder External Rotation  70 Degrees    Right Shoulder Horizontal ABduction  13 Degrees                   [facility] Adult PT Treatment/Exercise - 11/20/17 0001      Exercises   Exercises  Shoulder      Shoulder Exercises: Standing   Horizontal ABduction  Strengthening;Both;20 reps;Theraband    Theraband Level (Shoulder Horizontal ABduction)  Level 2 (Red)    Internal Rotation  AAROM;Both;20 reps behind back IR with PVC pipe    Extension  AAROM;Both;20 reps with pvc pipe      Shoulder Exercises: ROM/Strengthening   UBE (Upper Arm Bike)  90 RPM x8 min (4 min forward, 4 min backward)      Manual Therapy   Manual Therapy  Soft tissue  mobilization;Passive ROM    Soft tissue mobilization  STW/M and MFR to R axilla and lateral aspect of triceps with PROM to improve motion    Passive ROM  PROM to improve R flexion, abduction, ER ,IR, and horizontal abd with gentle over pressure to improve ROM                   PT Long Term Goals - 11/20/17 1537      PT LONG TERM GOAL #1   Title  Patient will be independent in home exercise program for right shoulder ROM, pect stretches, upper back strengthening.    Time  4    Period  Weeks    Status  Achieved      PT LONG TERM GOAL #2   Title  Right shoulder flexion to at least 140 degrees for improved overhead reach.    Time  4    Period  Weeks    Status  Achieved 159      PT LONG TERM GOAL #3   Title  right shoulder abduction to at least 140 degrees for improved ADLs    Time  4    Period  Weeks    Status  Achieved 142 degrees      PT LONG TERM GOAL #4   Title  right shoulder horizontal abduction to at least 20 degrees    Time  4    Period  Weeks    Status  Not Met 13 degrees      PT LONG TERM GOAL #5   Title  patient will show at least 65 degrees of right shoulder external rotation for improved ADLs    Time  4    Period  Weeks    Status  Achieved 70      PT LONG TERM GOAL #6   Title  Pt. will report at least 50% decrease overall in right shoulder pain (intensity and/or frequency)    Time  4    Period  Weeks    Status  Achieved      PT LONG TERM GOAL #7   Title  Quick DASH score will be reduced to 25 or less    Time  4    Period  Weeks    Status  Achieved 25            Plan - 11/20/17 1529    Clinical Impression Statement  Patient was able to tolerate treatment with no reports of R axilla "pulling". Patient still noted with increased R axilla tension with end range PROM horizontal abduction. PT and  patient discussed D/C next visit. Patient reported agreement.     Clinical Presentation  Stable    Clinical Decision Making  Low    Rehab Potential   Good    Clinical Impairments Affecting Rehab Potential  h/o radiation to right chest probably limits the potential for normal soft tissue mobility in that area    PT Frequency  2x / week    PT Duration  4 weeks    PT Treatment/Interventions  ADLs/Self Care Home Management;Therapeutic exercise;Patient/family education;Manual techniques;Scar mobilization;Passive range of motion    PT Next Visit Plan  HEP for ROM. D/C.    Consulted and Agree with Plan of Care  Patient       Patient will benefit from skilled therapeutic intervention in order to improve the following deficits and impairments:  Decreased range of motion, Pain, Decreased scar mobility, Postural dysfunction, Impaired UE functional use  Visit Diagnosis: Abnormal posture  Stiffness of right shoulder, not elsewhere classified  Disorder of the skin and subcutaneous tissue related to radiation, unspecified  Aftercare following surgery for neoplasm  Right shoulder pain, unspecified chronicity  Acute pain of right shoulder  Muscle weakness (generalized)  Localized edema     Problem List Patient Active Problem List   Diagnosis Date Noted  . Port catheter in place 09/08/2016  . Malignant neoplasm of upper-outer quadrant of right breast in female, estrogen receptor positive ([place]) 08/14/2016  . Breast cancer of upper-outer quadrant of right female breast ([place]) 08/13/2016  . Varicose veins of lower extremities with other complications 53/66/4403  . Hypothyroidism 04/24/2013   Gabriela Eves, PT, DPT 11/20/2017, 3:43 PM  [facility] [address] [place], [place], [address] Phone: [phone]   Fax:  [phone]  Name: Cynthia Chavez MRN: [id number] Date of Birth: 11-04-1955

## 2017-11-28 ENCOUNTER — Ambulatory Visit: Payer: BLUE CROSS/BLUE SHIELD | Admitting: Physical Therapy

## 2017-11-28 DIAGNOSIS — M6281 Muscle weakness (generalized): Secondary | ICD-10-CM

## 2017-11-28 DIAGNOSIS — M25611 Stiffness of right shoulder, not elsewhere classified: Secondary | ICD-10-CM

## 2017-11-28 DIAGNOSIS — Z483 Aftercare following surgery for neoplasm: Secondary | ICD-10-CM

## 2017-11-28 DIAGNOSIS — R293 Abnormal posture: Secondary | ICD-10-CM

## 2017-11-28 DIAGNOSIS — M25511 Pain in right shoulder: Secondary | ICD-10-CM

## 2017-11-28 DIAGNOSIS — L599 Disorder of the skin and subcutaneous tissue related to radiation, unspecified: Secondary | ICD-10-CM

## 2017-11-28 DIAGNOSIS — R600 Localized edema: Secondary | ICD-10-CM

## 2017-11-28 NOTE — Therapy (Signed)
[facility] [place], [place], [address] Phone: [phone]   Fax:  [phone]  Physical Therapy Treatment  Patient Details  Name: Cynthia Chavez MRN: [id number] Date of Birth: 10-Apr-1956 Referring Provider: Dr. Rolm Bookbinder   Encounter Date: 11/28/2017  PT End of Session - 11/28/17 1301    Visit Number  17    Number of Visits  17    Date for PT Re-Evaluation  11/28/17    PT Start Time  1300    PT Stop Time  1352    PT Time Calculation (min)  52 min    Activity Tolerance  Patient tolerated treatment well    Behavior During Therapy  [facility] for tasks assessed/performed       Past Medical History:  Diagnosis Date  . Anxiety   . Breast cancer ([place])   . Cancer [facility])    breast cancer  . Depression   . Edema    bilateral feet and leg swelling  . GERD (gastroesophageal reflux disease)   . History of hiatal hernia   . History of radiation therapy 02/12/17- 03/26/17   Chest wall and regional nodes, right- 50 Gy in 25 fractions, Chest Wall, boost, right- 10 Gy in 5 fractions.   . Hypothyroidism   . Malaise   . Thyroid disease   . Varicose veins     Past Surgical History:  Procedure Laterality Date  . ABDOMINAL HYSTERECTOMY    . AXILLARY LYMPH NODE BIOPSY Right 01/03/2017   Procedure: RIGHT AXILLARY LYMPH NODE SEED GUIDED EXCISIONAL BIOPSY;  Surgeon: Rolm Bookbinder, MD;  Location: [place];  Service: General;  Laterality: Right;  . CHOLECYSTECTOMY    . COLONOSCOPY    . ESOPHAGEAL DILATION    . MASTECTOMY Right   . MASTECTOMY W/ SENTINEL NODE BIOPSY Right 01/03/2017   axillary  . MASTECTOMY W/ SENTINEL NODE BIOPSY Right 01/03/2017   Procedure: RIGHT TOTAL MASTECTOMY WITH RIGHT AXILLARY SENTINEL LYMPH NODE BIOPSY;  Surgeon: Rolm Bookbinder, MD;  Location: [place];  Service: General;  Laterality: Right;  . PORTACATH PLACEMENT Left 08/17/2016   Procedure: INSERTION PORT-A-CATH WITH [place];  Surgeon: Rolm Bookbinder, MD;   Location: [place];  Service: General;  Laterality: Left;    There were no vitals filed for this visit.                    [facility] Adult PT Treatment/Exercise - 11/28/17 0001      Shoulder Exercises: Standing   Internal Rotation  AAROM;Both;20 reps    Extension  AAROM;Both;20 reps      Shoulder Exercises: ROM/Strengthening   UBE (Upper Arm Bike)  90 RPM x8 min (4 min forward, 4 min backward)    Wall Wash  x3 minutes flexion, x3 minutes circles      Manual Therapy   Manual Therapy  Soft tissue mobilization;Passive ROM    Soft tissue mobilization  STW/M and MFR to R axilla and lateral aspect of triceps with PROM to improve motion    Passive ROM  PROM to improve R flexion, abduction, ER ,IR, and horizontal abd with gentle over pressure to improve ROM                   PT Long Term Goals - 11/20/17 1537      PT LONG TERM GOAL #1   Title  Patient will be independent in home exercise program for right shoulder ROM, pect stretches, upper back  strengthening.    Time  4    Period  Weeks    Status  Achieved      PT LONG TERM GOAL #2   Title  Right shoulder flexion to at least 140 degrees for improved overhead reach.    Time  4    Period  Weeks    Status  Achieved 159      PT LONG TERM GOAL #3   Title  right shoulder abduction to at least 140 degrees for improved ADLs    Time  4    Period  Weeks    Status  Achieved 142 degrees      PT LONG TERM GOAL #4   Title  right shoulder horizontal abduction to at least 20 degrees    Time  4    Period  Weeks    Status  Not Met 13 degrees      PT LONG TERM GOAL #5   Title  patient will show at least 65 degrees of right shoulder external rotation for improved ADLs    Time  4    Period  Weeks    Status  Achieved 70      PT LONG TERM GOAL #6   Title  Pt. will report at least 50% decrease overall in right shoulder pain (intensity and/or frequency)    Time  4    Period  Weeks    Status  Achieved       PT LONG TERM GOAL #7   Title  Quick DASH score will be reduced to 25 or less    Time  4    Period  Weeks    Status  Achieved 25            Plan - 11/28/17 1355    Clinical Impression Statement  Patient was able to tolerate treatment well. Patient was able to demonstrate good form with HEP provided. Patient's right AROM WFL. Patient has met 6 of 7 goals.    Clinical Presentation  Stable    Clinical Decision Making  Low    Rehab Potential  Good    Clinical Impairments Affecting Rehab Potential  h/o radiation to right chest probably limits the potential for normal soft tissue mobility in that area    PT Frequency  2x / week    PT Duration  4 weeks    PT Treatment/Interventions  ADLs/Self Care Home Management;Therapeutic exercise;Patient/family education;Manual techniques;Scar mobilization;Passive range of motion    PT Next Visit Plan  DC    Consulted and Agree with Plan of Care  Patient       Patient will benefit from skilled therapeutic intervention in order to improve the following deficits and impairments:  Decreased range of motion, Pain, Decreased scar mobility, Postural dysfunction, Impaired UE functional use  Visit Diagnosis: Abnormal posture  Stiffness of right shoulder, not elsewhere classified  Disorder of the skin and subcutaneous tissue related to radiation, unspecified  Aftercare following surgery for neoplasm  Right shoulder pain, unspecified chronicity  Acute pain of right shoulder  Muscle weakness (generalized)  Localized edema     Problem List Patient Active Problem List   Diagnosis Date Noted  . Port catheter in place 09/08/2016  . Malignant neoplasm of upper-outer quadrant of right breast in female, estrogen receptor positive ([place]) 08/14/2016  . Breast cancer of upper-outer quadrant of right female breast ([place]) 08/13/2016  . Varicose veins of lower extremities with other complications 67/34/1937  . Hypothyroidism 04/24/2013  PHYSICAL  THERAPY DISCHARGE SUMMARY  Visits from Start of Care: 17  Current functional level related to goals / functional outcomes: See above   Remaining deficits: See goals   Education / Equipment: HEP  Plan: Patient agrees to discharge.  Patient goals were partially met. Patient is being discharged due to being pleased with the current functional level.  ?????       Gabriela Eves, PT, DPT 11/28/2017, 2:13 PM  [facility] [address] [place], [place], [address] Phone: [phone]   Fax:  [phone]  Name: HANSIKA LEAMING MRN: [id number] Date of Birth: [birth year]

## 2017-12-27 ENCOUNTER — Other Ambulatory Visit: Payer: Self-pay | Admitting: Hematology and Oncology

## 2017-12-27 DIAGNOSIS — C50411 Malignant neoplasm of upper-outer quadrant of right female breast: Secondary | ICD-10-CM

## 2017-12-27 DIAGNOSIS — Z170 Estrogen receptor positive status [ER+]: Principal | ICD-10-CM

## 2018-01-22 ENCOUNTER — Ambulatory Visit: Payer: BLUE CROSS/BLUE SHIELD | Admitting: Hematology and Oncology

## 2018-03-22 ENCOUNTER — Encounter: Payer: Self-pay | Admitting: Pediatrics

## 2018-03-22 ENCOUNTER — Ambulatory Visit: Payer: BLUE CROSS/BLUE SHIELD | Admitting: Pediatrics

## 2018-03-22 VITALS — BP 134/86 | HR 73 | Temp 97.6°F | Ht 67.0 in | Wt 175.0 lb

## 2018-03-22 DIAGNOSIS — R1011 Right upper quadrant pain: Secondary | ICD-10-CM | POA: Diagnosis not present

## 2018-03-22 LAB — CMP14+EGFR
A/G RATIO: 2 (ref 1.2–2.2)
ALK PHOS: 101 IU/L (ref 39–117)
ALT: 13 IU/L (ref 0–32)
AST: 12 IU/L (ref 0–40)
Albumin: 4.1 g/dL (ref 3.6–4.8)
BILIRUBIN TOTAL: 0.4 mg/dL (ref 0.0–1.2)
BUN/Creatinine Ratio: 17 (ref 12–28)
BUN: 10 mg/dL (ref 8–27)
CHLORIDE: 104 mmol/L (ref 96–106)
CO2: 25 mmol/L (ref 20–29)
Calcium: 9.3 mg/dL (ref 8.7–10.3)
Creatinine, Ser: 0.58 mg/dL (ref 0.57–1.00)
GFR calc Af Amer: 113 mL/min/{1.73_m2} (ref >59)
GFR calc non Af Amer: 98 mL/min/{1.73_m2} (ref >59)
Globulin, Total: 2.1 g/dL (ref 1.5–4.5)
Glucose: 86 mg/dL (ref 65–99)
POTASSIUM: 4.1 mmol/L (ref 3.5–5.2)
SODIUM: 142 mmol/L (ref 134–144)
Total Protein: 6.2 g/dL (ref 6.0–8.5)

## 2018-03-22 LAB — CBC WITH DIFFERENTIAL/PLATELET
BASOS ABS: 0 10*3/uL (ref 0.0–0.2)
Basos: 0 %
EOS (ABSOLUTE): 0.3 10*3/uL (ref 0.0–0.4)
Eos: 5 %
Hematocrit: 39.1 % (ref 34.0–46.6)
Hemoglobin: 13 g/dL (ref 11.1–15.9)
IMMATURE GRANULOCYTES: 0 %
Immature Grans (Abs): 0 10*3/uL (ref 0.0–0.1)
LYMPHS: 16 %
Lymphocytes Absolute: 1 10*3/uL (ref 0.7–3.1)
MCH: 31.6 pg (ref 26.6–33.0)
MCHC: 33.2 g/dL (ref 31.5–35.7)
MCV: 95 fL (ref 79–97)
MONOS ABS: 0.5 10*3/uL (ref 0.1–0.9)
Monocytes: 8 %
NEUTROS PCT: 71 %
Neutrophils Absolute: 4.3 10*3/uL (ref 1.4–7.0)
PLATELETS: 197 10*3/uL (ref 150–450)
RBC: 4.12 x10E6/uL (ref 3.77–5.28)
RDW: 13 % (ref 12.3–15.4)
WBC: 6.1 10*3/uL (ref 3.4–10.8)

## 2018-03-22 NOTE — Progress Notes (Signed)
  Subjective:   Patient ID: Cynthia Chavez, female    DOB: [birth year], 62 y.o.   MRN: [id number] CC: Cyst (right upper abdomen)  HPI: Cynthia Chavez is a 62 y.o. female   She is swelling in her right upper abdomen that comes and goes.  Sometimes pain is worse with food, sometimes she feels like the swelling gets worse after eating.  No swelling there now.  Does not always happen associated with food.  Appetite has been fine.  No nausea or vomiting.  Having regular bowel movements, no constipation.  No blood in her stools.  No fevers.  Appetite is been fine.  Relevant past medical, surgical, family and social history reviewed. Allergies and medications reviewed and updated. Social History   Tobacco Use  Smoking Status Current Every Day Smoker  . Packs/day: 0.50  . Years: 25.00  . Pack years: 12.50  . Types: Cigarettes  . Start date: 04/19/1993  Smokeless Tobacco Never Used  Tobacco Comment   she stopped smoking for a long time, but started back.    ROS: Per HPI   Objective:    BP 134/86   Pulse 73   Temp 97.6 F (36.4 C) (Oral)   Ht _0  (1.702 m)   Wt 175 lb (79.4 kg)   BMI 27.41 kg/m   Wt Readings from Last 3 Encounters:  03/22/18 175 lb (79.4 kg)  11/15/17 177 lb (80.3 kg)  11/06/17 174 lb (78.9 kg)    Gen: NAD, alert, cooperative with exam, NCAT EYES: EOMI, no conjunctival injection, or no icterus ENT:   OP without erythema LYMPH: no cervical LAD CV: NRRR, normal S1/S2, no murmur, distal pulses 2+ b/l Resp: CTABL, no wheezes, normal WOB Abd: +BS, soft, mildly tender with deep palpation right upper quadrant, ND. no guarding or organomegaly Ext: No edema, warm Neuro: Alert and oriented  Assessment & Plan:  Cynthia Chavez was seen today for abdominal swelling.  Diagnoses and all orders for this visit:  RUQ pain -     US Abdomen Complete; Future -     CMP14+EGFR -     CBC with Differential/Platelet   Follow up plan: Return in about 4 weeks (around 04/19/2018). Cynthia Found, MD [place]

## 2018-03-23 ENCOUNTER — Encounter: Payer: Self-pay | Admitting: Pediatrics

## 2018-03-25 ENCOUNTER — Other Ambulatory Visit: Payer: Self-pay | Admitting: Pediatrics

## 2018-03-25 DIAGNOSIS — E039 Hypothyroidism, unspecified: Secondary | ICD-10-CM

## 2018-03-27 ENCOUNTER — Ambulatory Visit (HOSPITAL_COMMUNITY)
Admission: RE | Admit: 2018-03-27 | Discharge: 2018-03-27 | Disposition: A | Discharge status: Home / Self Care | Payer: BLUE CROSS/BLUE SHIELD | Source: Ambulatory Visit | Attending: Pediatrics | Admitting: Pediatrics

## 2018-03-27 DIAGNOSIS — K760 Fatty (change of) liver, not elsewhere classified: Secondary | ICD-10-CM | POA: Diagnosis not present

## 2018-03-27 DIAGNOSIS — R1011 Right upper quadrant pain: Secondary | ICD-10-CM | POA: Insufficient documentation

## 2018-03-28 ENCOUNTER — Telehealth: Payer: Self-pay

## 2018-03-28 MED ORDER — ANASTROZOLE 1 MG PO TABS: 1.0000 mg | ORAL_TABLET | Freq: Every day | ORAL | 3 refills | Status: DC

## 2018-03-28 NOTE — Telephone Encounter (Signed)
Returned patient's call.  Patient requesting to have new prescription of Anastrozole faxed to pharmacy for home delivery.  Rx # [id number] and fax number [phone] per patient.  Nurse will provide this Rx to above fax.  Patient voiced agreement and appreciation.  No further needs at this time.

## 2018-03-29 ENCOUNTER — Telehealth: Payer: Self-pay | Admitting: Pediatrics

## 2018-03-29 NOTE — Telephone Encounter (Signed)
Results have not been reviewed.  No answer, voicemail full

## 2018-04-10 NOTE — Telephone Encounter (Signed)
Aware. 

## 2018-05-14 ENCOUNTER — Telehealth: Payer: Self-pay | Admitting: Pediatrics

## 2018-05-20 ENCOUNTER — Telehealth: Payer: Self-pay | Admitting: Pediatrics

## 2018-05-20 MED ORDER — LIPITOR 20 MG PO TABS: 20.0000 mg | ORAL_TABLET | Freq: Every day | ORAL | 1 refills | Status: DC

## 2018-05-20 NOTE — Telephone Encounter (Signed)
I called patient and asked her about why she needed Dispense as written on her lipitor and patient states that she has never been on lipitor before and she has not requested lipitor. I called the pharmacy and advised them to discontinue the lipitor. Lipitor discontinued from patients chart.

## 2018-05-20 NOTE — Telephone Encounter (Signed)
Called patient she states she did not call and request Lipitor to be sent in.  Called pharmacy, and canceled prescription.

## 2018-05-20 NOTE — Telephone Encounter (Signed)
Sent in

## 2018-05-20 NOTE — Telephone Encounter (Signed)
Please advise, Lipitor is not on patient's medication list.

## 2018-06-12 ENCOUNTER — Other Ambulatory Visit: Payer: Self-pay | Admitting: General Surgery

## 2018-06-12 DIAGNOSIS — Z1231 Encounter for screening mammogram for malignant neoplasm of breast: Secondary | ICD-10-CM

## 2018-07-29 ENCOUNTER — Ambulatory Visit
Admission: RE | Admit: 2018-07-29 | Discharge: 2018-07-29 | Disposition: A | Discharge status: Home / Self Care | Payer: BLUE CROSS/BLUE SHIELD | Source: Ambulatory Visit | Attending: General Surgery | Admitting: General Surgery

## 2018-07-29 DIAGNOSIS — Z1231 Encounter for screening mammogram for malignant neoplasm of breast: Secondary | ICD-10-CM

## 2018-07-29 HISTORY — DX: Personal history of antineoplastic chemotherapy: Z92.21

## 2018-07-29 HISTORY — DX: Personal history of irradiation: Z92.3

## 2018-10-02 NOTE — Progress Notes (Signed)
Patient Care Team: [name], MD as PCP - General (Pediatrics)  DIAGNOSIS:    ICD-10-CM   1. Malignant neoplasm of upper-outer quadrant of right breast in female, estrogen receptor positive ([place]) C50.411    Z17.0     SUMMARY OF ONCOLOGIC HISTORY:   Breast cancer of upper-outer quadrant of right female breast ([place])   07/24/2016 Mammogram    Rt Breast 2 masses at 11-12:30 Position spanning 8 cm; Additional mass at 2 o clock: 1.5 cm; LN with cort thickening 2.7 cm    07/27/2016 Initial Diagnosis    Rt Biopsy 11oclock and 2 o clock: Grade 3 IDC with LVI; LN biopsy: Positive IDC; 11oclock: Er 100%, PR 90%, Ki 67: 90%; Her 2 Neg Heterogeneous; LN: Er 100%, PR: 90%; Ki67: 60%; Her 2 Pos Ratio 2.37; 2oclock: Er 100%, PR 90%; Ki 67: 90%; Her 2 Positive 2.52    08/16/2016 - 12/01/2016 Neo-Adjuvant Chemotherapy    Neoadjuvant chemotherapy with TCH Perjeta (Taxotere d/ced after 2 cycles)    12/06/2016 Breast MRI    Residual right breast 2 o'clock enhancing mass which now measures 1.3 cm in greatest dimension.Residual non measurable scattered foci of heterogeneous enhancement throughout the superior and subareolar right breast, anterior and middle depth. Diminished right axillary lymphadenopathy    01/03/2017 Surgery    Right mastectomy: Multiple IDC with micropapillary features, LVI, grade 3, 1.5, 0.6, 0.5 cm, 12/20 lymph nodes positive for cancer, margins negative, ER 100%, PR 90%, HER-2 positive, Ki-67 60%,ypT1c ypN3 RCB class III; stage IIIB    02/12/2017 - 03/26/2017 Radiation Therapy    Adjuvant radiation therapy    04/06/2017 -  Anti-estrogen oral therapy    Anastrozole 1 mg by mouth daily, could not tolerate Neratinib; stopped 10/03/18 due to severe myalgias, switched to tamoxifen started 10/17/18     CHIEF COMPLIANT: Follow-up of anastrozole therapy  INTERVAL HISTORY: Cynthia Chavez is a 63 y.o. with above-mentioned history of right breast cancer treated with neoadjuvant chemotherapy  followed by mastectomy and radiation. She is currently on antiestrogen therapy with anastrozole, as she could not tolerate Neratinib. I last saw her one year ago. A mammogram on 07/29/18 showed no evidence of malignancy in the left breast. She presents to the clinic alone today. She notes bone pain and muscle aches in her knees, legs, and back. She notes swelling in her left breast. She has gained weight and had trouble losing it despite exercising due to feeling fatigued and bloated. She notes a yeast infection and vaginal pain, but denies difficulty urinating. She reviewed her medication list with me.   REVIEW OF SYSTEMS:   Constitutional: Denies fevers, chills or abnormal weight loss (+) fatigue Eyes: Denies blurriness of vision Ears, nose, mouth, throat, and face: Denies mucositis or sore throat Respiratory: Denies cough, dyspnea or wheezes Cardiovascular: Denies palpitation, chest discomfort Gastrointestinal: Denies nausea, heartburn or change in bowel habits Skin: Denies abnormal skin rashes MSK: (+) bone pain (+) myalgias GYN: (+) vaginal pain and soreness Lymphatics: Denies new lymphadenopathy or easy bruising Neurological: Denies numbness, tingling or new weaknesses Behavioral/Psych: Mood is stable, no new changes  Extremities: No lower extremity edema Breast: denies any pain or lumps or nodules in either breasts (+) swelling in left breast All other systems were reviewed with the patient and are negative.  I have reviewed the past medical history, past surgical history, social history and family history with the patient and they are unchanged from previous note.  ALLERGIES:  is allergic  to ivp dye [iodinated diagnostic agents]; other; dilaudid [hydromorphone hcl]; asa [aspirin]; and codeine.  MEDICATIONS:  Current Outpatient Medications  Medication Sig Dispense Refill  . azelastine (ASTELIN) 0.1 % nasal spray Place 1 spray into both nostrils 2 (two) times daily. Use in each  nostril as directed 30 mL 12  . levothyroxine (SYNTHROID, LEVOTHROID) 150 MCG tablet TAKE 1 TABLET BY MOUTH  DAILY BEFORE BREAKFAST 90 tablet 2  . loratadine (CLARITIN) 10 MG tablet Take 1 tablet (10 mg total) by mouth daily. 30 tablet 11  . tamoxifen (NOLVADEX) 20 MG tablet Take 1 tablet (20 mg total) by mouth daily. 90 tablet 3   No current facility-administered medications for this visit.     PHYSICAL EXAMINATION: ECOG PERFORMANCE STATUS: 1 - Symptomatic but completely ambulatory  Vitals:   10/03/18 1452  BP: 126/79  Pulse: (!) 106  Resp: 17  Temp: 98.3 F (36.8 C)  SpO2: 98%   Filed Weights   10/03/18 1452  Weight: 193 lb 14.4 oz (88 kg)    GENERAL: alert, no distress and comfortable SKIN: skin color, texture, turgor are normal, no rashes or significant lesions EYES: normal, Conjunctiva are pink and non-injected, sclera clear OROPHARYNX: no exudate, no erythema and lips, buccal mucosa, and tongue normal  NECK: supple, thyroid normal size, non-tender, without nodularity LYMPH: no palpable lymphadenopathy in the cervical, axillary or inguinal LUNGS: clear to auscultation and percussion with normal breathing effort HEART: regular rate & rhythm and no murmurs and no lower extremity edema ABDOMEN: abdomen soft, non-tender and normal bowel sounds MUSCULOSKELETAL: no cyanosis of digits and no clubbing  NEURO: alert & oriented x 3 with fluent speech, no focal motor/sensory deficits EXTREMITIES: No lower extremity edema BREAST: No palpable masses or nodules in either right or left breasts. No palpable axillary supraclavicular or infraclavicular adenopathy no breast tenderness or nipple discharge. (exam performed in the presence of a chaperone)  LABORATORY DATA:  I have reviewed the data as listed CMP Latest Ref Rng & Units 03/22/2018 10/04/2017 09/06/2017  Glucose 65 - 99 mg/dL 86 103 93  BUN 8 - 27 mg/dL '10 15 13  ' Creatinine 0.57 - 1.00 mg/dL 0.58 0.80 0.73  Sodium 134 - 144  mmol/L 142 143 142  Potassium 3.5 - 5.2 mmol/L 4.1 3.9 3.6  Chloride 96 - 106 mmol/L 104 108 105  CO2 20 - 29 mmol/L '25 26 28  ' Calcium 8.7 - 10.3 mg/dL 9.3 9.1 9.4  Total Protein 6.0 - 8.5 g/dL 6.2 6.4 7.1  Total Bilirubin 0.0 - 1.2 mg/dL 0.4 0.3 0.4  Alkaline Phos 39 - 117 IU/L 101 94 109  AST 0 - 40 IU/L '12 10 13  ' ALT 0 - 32 IU/L '13 13 12    ' Lab Results  Component Value Date   WBC 6.1 03/22/2018   HGB 13.0 03/22/2018   HCT 39.1 03/22/2018   MCV 95 03/22/2018   PLT 197 03/22/2018   NEUTROABS 4.3 03/22/2018    ASSESSMENT & PLAN:  Breast cancer of upper-outer quadrant of right female breast ([place]) Rt Biopsy 11oclock and 2 o clock: Grade 3 IDC with LVI; LN biopsy: Positive IDC; 11oclock: Er 100%, PR 90%, Ki 67: 90%; Her 2 Neg Heterogeneous; LN: Er 100%, PR: 90%; Ki67: 60%; Her 2 Pos Ratio 2.37; 2oclock: Er 100%, PR 90%; Ki 67: 90%; Her 2 Positive 2.52 Breast MRI on 08/13/2016: 8 cm span of breast cancer with multiple enlarged axillary lymph nodes. CT CAP: 08/17/16: 4 mm right middle  lobe pulmonary nodule unlikely to be metastatic disease Bone scan 08/17/2016: No evidence of metastatic disease  Echocardiogram performed on 05/17/2017 showed a left ventricular ejection fraction of 60-65%.  Treatment summary: 1. Neoadjuvant chemotherapy with [place] Perjeta 6 cyclesgiven1/10/18 to 12/01/16 followed by Herceptin Perjetamaintenance for 1 year  2. Followed by mastectomy with axillary node dissection 01/03/2017 3. Adjuvant radiation therapycompleted 03/26/2017 4. Followed by Anti-estrogen therapy started 04/06/2017 5. Followed by Neratinib for 1 yearstarted 09/07/2017 -------------------------------------------------------------------------------------------------------------------------------------------------------------------------------------------------------------------------------------- 01/04/2007 Right mastectomy: Multiple IDC with micropapillary features, LVI, grade 3, 1.5, 0.6,  0.5 cm, 12/20 lymph nodes positive for cancer, margins negative, ER 100%, PR 90%, HER-2 positive, Ki-67 60%,ypT1c ypN3 RCB class III; stage IIIB  Adjuvant radiation therapy started 07/09/2018completed 03/26/2017  Current treatment:Neratinib (started 09/01/17 discontinued 10/04/2017) andAnastrozole started 04/06/17 switched to tamoxifen 10/03/2018  Anastrozole toxicities: 1.  Diffuse muscle aches and pains 2.  Weight gain: I discussed with her that she needs to cut down the amount of food that she takes and exercise regularly.  She has not been paying much attention to her diet.  We discussed different options and recommended tamoxifen. Tamoxifen counseling: We discussed the risks and benefits of tamoxifen. These include but not limited to insomnia, hot flashes, mood changes, vaginal dryness, and weight gain. Although rare, serious side effects including endometrial cancer, risk of blood clots were also discussed. We strongly believe that the benefits far outweigh the risks. Patient understands these risks and consented to starting treatment. Planned treatment duration is 5 years.   Return to clinic in 1 year for follow-up    No orders of the defined types were placed in this encounter.  The patient has a good understanding of the overall plan. she agrees with it. she will call with any problems that may develop before the next visit here.  Cynthia Lose, MD 10/03/2018  Julious Oka Dorshimer am acting as scribe for Dr. Nicholas Chavez.  I have reviewed the above documentation for accuracy and completeness, and I agree with the above.

## 2018-10-03 ENCOUNTER — Inpatient Hospital Stay: Payer: Medicare HMO | Attending: Hematology and Oncology | Admitting: Hematology and Oncology

## 2018-10-03 DIAGNOSIS — Z79810 Long term (current) use of selective estrogen receptor modulators (SERMs): Secondary | ICD-10-CM

## 2018-10-03 DIAGNOSIS — Z923 Personal history of irradiation: Secondary | ICD-10-CM | POA: Insufficient documentation

## 2018-10-03 DIAGNOSIS — Z9011 Acquired absence of right breast and nipple: Secondary | ICD-10-CM | POA: Diagnosis not present

## 2018-10-03 DIAGNOSIS — Z170 Estrogen receptor positive status [ER+]: Secondary | ICD-10-CM | POA: Diagnosis not present

## 2018-10-03 DIAGNOSIS — C50411 Malignant neoplasm of upper-outer quadrant of right female breast: Secondary | ICD-10-CM | POA: Diagnosis not present

## 2018-10-03 DIAGNOSIS — Z9221 Personal history of antineoplastic chemotherapy: Secondary | ICD-10-CM | POA: Diagnosis not present

## 2018-10-03 MED ORDER — TAMOXIFEN CITRATE 20 MG PO TABS: 20.0000 mg | ORAL_TABLET | Freq: Every day | ORAL | 3 refills | Status: DC

## 2018-10-03 NOTE — Assessment & Plan Note (Signed)
Rt Biopsy 11oclock and 2 o clock: Grade 3 IDC with LVI; LN biopsy: Positive IDC; 11oclock: Er 100%, PR 90%, Ki 67: 90%; Her 2 Neg Heterogeneous; LN: Er 100%, PR: 90%; Ki67: 60%; Her 2 Pos Ratio 2.37; 2oclock: Er 100%, PR 90%; Ki 67: 90%; Her 2 Positive 2.52 Breast MRI on 08/13/2016: 8 cm span of breast cancer with multiple enlarged axillary lymph nodes. CT CAP: 08/17/16: 4 mm right middle lobe pulmonary nodule unlikely to be metastatic disease Bone scan 08/17/2016: No evidence of metastatic disease  Echocardiogram performed on 05/17/2017 showed a left ventricular ejection fraction of 60-65%.  Treatment summary: 1. Neoadjuvant chemotherapy with Wann Perjeta 6 cyclesgiven1/10/18 to 12/01/16 followed by Herceptin Perjetamaintenance for 1 year  2. Followed by mastectomy with axillary node dissection 01/03/2017 3. Adjuvant radiation therapycompleted 03/26/2017 4. Followed by Anti-estrogen therapy started 04/06/2017 5. Followed by Neratinib for 1 yearstarted 09/07/2017 -------------------------------------------------------------------------------------------------------------------------------------------------------------------------------------------------------------------------------------- 01/04/2007 Right mastectomy: Multiple IDC with micropapillary features, LVI, grade 3, 1.5, 0.6, 0.5 cm, 12/20 lymph nodes positive for cancer, margins negative, ER 100%, PR 90%, HER-2 positive, Ki-67 60%,ypT1c ypN3 RCB class III; stage IIIB  Adjuvant radiation therapy started 07/09/2018completed 03/26/2017  Current treatment:Neratinib (started 09/01/17 discontinued 10/04/2017) andAnastrozole started 04/06/17 switched to tamoxifen 10/03/2018  Anastrozole toxicities: 1.  Diffuse muscle aches and pains 2.  Weight gain: I discussed with her that she needs to cut down the amount of food that she takes and exercise regularly.  She has not been paying much attention to her diet.  We discussed different  options and recommended tamoxifen.  Return to clinic in 1 year for follow-up

## 2018-10-04 ENCOUNTER — Telehealth: Payer: Self-pay

## 2018-10-04 MED ORDER — FLUCONAZOLE 100 MG PO TABS: 100.0000 mg | ORAL_TABLET | Freq: Every day | ORAL | 1 refills | Status: DC

## 2018-10-04 NOTE — Telephone Encounter (Signed)
Pt called reporting pain on voiding and vaginal discharge.   Prescription for fluconazole sent to pharmacy per Dr Jana Hakim.

## 2018-10-10 ENCOUNTER — Other Ambulatory Visit: Payer: Self-pay

## 2018-10-10 MED ORDER — TAMOXIFEN CITRATE 20 MG PO TABS: 20.0000 mg | ORAL_TABLET | Freq: Every day | ORAL | 3 refills | Status: DC

## 2018-10-25 ENCOUNTER — Telehealth: Payer: Self-pay | Admitting: Pediatrics

## 2018-10-25 NOTE — Telephone Encounter (Signed)
Saline nose spray flonase nose spray claritin 10 mg daily

## 2018-10-25 NOTE — Telephone Encounter (Signed)
[facility]  SWITCHBOARD  SICK CALL SCREENING   10/25/2018  Cynthia Chavez    ZOX:[id number]    DOB:1956-01-16  Best Contact Telephone Number: [phone]   5. Symptoms: Both ears are stopped up  2. Symptom Onset: Week and half  3. Have you traveled any over the past 14 days? No  4.   Have you been in recent contact with someone that has tested positive for COVID-19? No  5.   Which pharmacy would you use today if given a prescription? Cynthia Chavez was informed that this information will be given to a clinical staff member to review and that they should receive a follow-up telephone call within 24 hours. she was advised to call back if she develops any new symptoms or if her current symptoms worsen.   Screened by: Cline Crock

## 2018-10-25 NOTE — Telephone Encounter (Signed)
Patient aware.

## 2018-11-11 ENCOUNTER — Encounter: Payer: Self-pay | Admitting: Family Medicine

## 2018-11-11 ENCOUNTER — Other Ambulatory Visit: Payer: Self-pay

## 2018-11-11 ENCOUNTER — Ambulatory Visit (INDEPENDENT_AMBULATORY_CARE_PROVIDER_SITE_OTHER): Payer: Medicare HMO | Admitting: Family Medicine

## 2018-11-11 VITALS — BP 137/89 | HR 84 | Temp 98.6°F | Ht 67.0 in | Wt 191.0 lb

## 2018-11-11 DIAGNOSIS — L209 Atopic dermatitis, unspecified: Secondary | ICD-10-CM | POA: Diagnosis not present

## 2018-11-11 DIAGNOSIS — J301 Allergic rhinitis due to pollen: Secondary | ICD-10-CM | POA: Diagnosis not present

## 2018-11-11 DIAGNOSIS — E039 Hypothyroidism, unspecified: Secondary | ICD-10-CM

## 2018-11-11 MED ORDER — AZELASTINE HCL 0.1 % NA SOLN: 1.0000 | Freq: Two times a day (BID) | NASAL | 12 refills | Status: DC

## 2018-11-11 MED ORDER — LORATADINE 10 MG PO TABS: 10.0000 mg | ORAL_TABLET | Freq: Every day | ORAL | 11 refills | Status: DC

## 2018-11-11 MED ORDER — TRIAMCINOLONE ACETONIDE 0.1 % EX CREA: 1.0000 "application " | TOPICAL_CREAM | Freq: Two times a day (BID) | CUTANEOUS | 0 refills | Status: DC

## 2018-11-11 NOTE — Progress Notes (Signed)
Subjective:  Patient ID: Cynthia Chavez, female    DOB: November 27, 1955, 63 y.o.   MRN: [id number]  Chief Complaint:  Medical Management of Chronic Issues   HPI: Cynthia Chavez is a 63 y.o. female presenting on 11/11/2018 for Medical Management of Chronic Issues   Pt presents today for follow up for her thyroid. Pt states she has been doing well. She denies hyper or hypo symptoms. States she is taking her medications as prescribed without associated side effects.  Pt states she has noticed an increase in rhinorrhea, postnasal drip, nasal congestion, and ear popping. Pt states she is out of her nasal spray and has been using benadryl as needed for symptom relief. No fever, cough, chills, shortness of breath, or chest pain. No recent travel or known sick contacts.  Pt states she has a crusting, pruritic rash to her left lower leg. States this started 2 weeks ago and is not getting better. She reports she has tried honey on the rash without relief.   Relevant past medical, surgical, family, and social history reviewed and updated as indicated.  Allergies and medications reviewed and updated.   Past Medical History:  Diagnosis Date  . Anxiety   . Breast cancer ([place])   . Cancer [facility])    breast cancer  . Depression   . Edema    bilateral feet and leg swelling  . GERD (gastroesophageal reflux disease)   . History of hiatal hernia   . History of radiation therapy 02/12/17- 03/26/17   Chest wall and regional nodes, right- 50 Gy in 25 fractions, Chest Wall, boost, right- 10 Gy in 5 fractions.   . Hypothyroidism   . Malaise   . Personal history of chemotherapy   . Personal history of radiation therapy   . Thyroid disease   . Varicose veins     Past Surgical History:  Procedure Laterality Date  . ABDOMINAL HYSTERECTOMY    . AXILLARY LYMPH NODE BIOPSY Right 01/03/2017   Procedure: RIGHT AXILLARY LYMPH NODE SEED GUIDED EXCISIONAL BIOPSY;  Surgeon: Rolm Bookbinder, MD;  Location: [place];   Service: General;  Laterality: Right;  . CHOLECYSTECTOMY    . COLONOSCOPY    . ESOPHAGEAL DILATION    . MASTECTOMY Right   . MASTECTOMY W/ SENTINEL NODE BIOPSY Right 01/03/2017   axillary  . MASTECTOMY W/ SENTINEL NODE BIOPSY Right 01/03/2017   Procedure: RIGHT TOTAL MASTECTOMY WITH RIGHT AXILLARY SENTINEL LYMPH NODE BIOPSY;  Surgeon: Rolm Bookbinder, MD;  Location: [place];  Service: General;  Laterality: Right;  . PORTACATH PLACEMENT Left 08/17/2016   Procedure: INSERTION PORT-A-CATH WITH [place];  Surgeon: Rolm Bookbinder, MD;  Location: [place];  Service: General;  Laterality: Left;    Social History   Socioeconomic History  . Marital status: Married    Spouse name: Not on file  . Number of children: Not on file  . Years of education: Not on file  . Highest education level: Not on file  Occupational History  . Not on file  Social Needs  . Financial resource strain: Not on file  . Food insecurity:    Worry: Not on file    Inability: Not on file  . Transportation needs:    Medical: Not on file    Non-medical: Not on file  Tobacco Use  . Smoking status: Current Every Day Smoker    Packs/day: 0.50    Years: 25.00    Pack years: 12.50  Types: Cigarettes    Start date: 04/19/1993  . Smokeless tobacco: Never Used  . Tobacco comment: she stopped smoking for a long time, but started back.   Substance and Sexual Activity  . Alcohol use: No  . Drug use: No  . Sexual activity: Not on file  Lifestyle  . Physical activity:    Days per week: Not on file    Minutes per session: Not on file  . Stress: Not on file  Relationships  . Social connections:    Talks on phone: Not on file    Gets together: Not on file    Attends religious service: Not on file    Active member of club or organization: Not on file    Attends meetings of clubs or organizations: Not on file    Relationship status: Not on file  . Intimate partner violence:    Fear of current or ex  partner: Not on file    Emotionally abused: Not on file    Physically abused: Not on file    Forced sexual activity: Not on file  Other Topics Concern  . Not on file  Social History Narrative  . Not on file    Outpatient Encounter Medications as of 11/11/2018  Medication Sig  . azelastine (ASTELIN) 0.1 % nasal spray Place 1 spray into both nostrils 2 (two) times daily. Use in each nostril as directed  . levothyroxine (SYNTHROID, LEVOTHROID) 150 MCG tablet TAKE 1 TABLET BY MOUTH  DAILY BEFORE BREAKFAST  . tamoxifen (NOLVADEX) 20 MG tablet Take 1 tablet (20 mg total) by mouth daily.  . [DISCONTINUED] azelastine (ASTELIN) 0.1 % nasal spray Place 1 spray into both nostrils 2 (two) times daily. Use in each nostril as directed  . [DISCONTINUED] azelastine (ASTELIN) 0.1 % nasal spray Place 1 spray into both nostrils 2 (two) times daily. Use in each nostril as directed  . loratadine (CLARITIN) 10 MG tablet Take 1 tablet (10 mg total) by mouth daily.  Marland Kitchen triamcinolone cream (KENALOG) 0.1 % Apply 1 application topically 2 (two) times daily.  . [DISCONTINUED] fluconazole (DIFLUCAN) 100 MG tablet Take 1 tablet (100 mg total) by mouth daily. Take one pill by mouth for 2 days.  . [DISCONTINUED] loratadine (CLARITIN) 10 MG tablet Take 1 tablet (10 mg total) by mouth daily. (Patient not taking: Reported on 11/11/2018)   No facility-administered encounter medications on file as of 11/11/2018.     Allergies  Allergen Reactions  . Ivp Dye [Iodinated Diagnostic Agents] Other (See Comments)    Chest pain  . Other Shortness Of Breath, Swelling and Other (See Comments)    Versed or Fentanyl when used for endoscopy caused throat and tongue swelling  . Dilaudid [Hydromorphone Hcl]     PT had CP and felt shaky. Didn't like the feeling, but sx lessened w/in 10 min of IV administration  . Asa [Aspirin] Other (See Comments)    spasms  . Codeine Other (See Comments)    spasms    Review of Systems  Constitutional:  Negative for chills, fatigue and fever.  HENT: Positive for congestion, postnasal drip, rhinorrhea and sneezing. Negative for ear pain, sinus pressure, sinus pain and tinnitus.   Respiratory: Negative for cough and shortness of breath.   Cardiovascular: Negative for chest pain, palpitations and leg swelling.  Endocrine: Negative for cold intolerance and heat intolerance.  Musculoskeletal: Negative for arthralgias and myalgias.  Skin: Positive for rash. Negative for color change.  Neurological: Negative for dizziness, weakness,  numbness and headaches.  Psychiatric/Behavioral: Negative for confusion.  All other systems reviewed and are negative.       Objective:  BP 137/89   Pulse 84   Temp 98.6 F (37 C) (Oral)   Ht '5\' 7"'  (1.702 m)   Wt 191 lb (86.6 kg)   BMI 29.91 kg/m    Wt Readings from Last 3 Encounters:  11/11/18 191 lb (86.6 kg)  10/03/18 193 lb 14.4 oz (88 kg)  03/22/18 175 lb (79.4 kg)    Physical Exam Vitals signs and nursing note reviewed.  Constitutional:      General: She is not in acute distress.    Appearance: Normal appearance. She is well-developed and well-groomed. She is not ill-appearing or toxic-appearing.  HENT:     Head: Normocephalic and atraumatic.     Right Ear: Hearing, ear canal and external ear normal. A middle ear effusion is present. Tympanic membrane is not perforated or erythematous.     Left Ear: Hearing, ear canal and external ear normal. A middle ear effusion is present. Tympanic membrane is not perforated or erythematous.     Nose: Congestion and rhinorrhea present. Rhinorrhea is clear.     Right Turbinates: Swollen and pale.     Left Turbinates: Swollen and pale.     Right Sinus: No maxillary sinus tenderness or frontal sinus tenderness.     Left Sinus: No maxillary sinus tenderness or frontal sinus tenderness.     Mouth/Throat:     Lips: Pink.     Pharynx: Oropharynx is clear. No pharyngeal swelling, oropharyngeal exudate, posterior  oropharyngeal erythema or uvula swelling.     Tonsils: No tonsillar exudate or tonsillar abscesses.  Eyes:     General: Lids are normal.     Extraocular Movements: Extraocular movements intact.     Conjunctiva/sclera: Conjunctivae normal.     Pupils: Pupils are equal, round, and reactive to light.  Neck:     Musculoskeletal: Normal range of motion and neck supple.     Thyroid: No thyroid mass, thyromegaly or thyroid tenderness.     Vascular: No carotid bruit.     Trachea: Trachea and phonation normal.  Cardiovascular:     Rate and Rhythm: Normal rate and regular rhythm.     Heart sounds: Normal heart sounds. No murmur. No friction rub. No gallop.   Pulmonary:     Effort: Pulmonary effort is normal.     Breath sounds: Normal breath sounds.  Lymphadenopathy:     Cervical: No cervical adenopathy.  Skin:    General: Skin is warm and dry.     Findings: Rash present. Rash is crusting and vesicular.       Neurological:     General: No focal deficit present.     Mental Status: She is alert and oriented to person, place, and time.  Psychiatric:        Mood and Affect: Mood normal.        Behavior: Behavior normal. Behavior is cooperative.        Thought Content: Thought content normal.        Judgment: Judgment normal.     Results for orders placed or performed in visit on 03/22/18  CMP14+EGFR  Result Value Ref Range   Glucose 86 65 - 99 mg/dL   BUN 10 8 - 27 mg/dL   Creatinine, Ser 0.58 0.57 - 1.00 mg/dL   GFR calc non Af Amer 98 >59 mL/min/1.73   GFR calc Af Amer 113 >  59 mL/min/1.73   BUN/Creatinine Ratio 17 12 - 28   Sodium 142 134 - 144 mmol/L   Potassium 4.1 3.5 - 5.2 mmol/L   Chloride 104 96 - 106 mmol/L   CO2 25 20 - 29 mmol/L   Calcium 9.3 8.7 - 10.3 mg/dL   Total Protein 6.2 6.0 - 8.5 g/dL   Albumin 4.1 3.6 - 4.8 g/dL   Globulin, Total 2.1 1.5 - 4.5 g/dL   Albumin/Globulin Ratio 2.0 1.2 - 2.2   Bilirubin Total 0.4 0.0 - 1.2 mg/dL   Alkaline Phosphatase 101 39 -  117 IU/L   AST 12 0 - 40 IU/L   ALT 13 0 - 32 IU/L  CBC with Differential/Platelet  Result Value Ref Range   WBC 6.1 3.4 - 10.8 x10E3/uL   RBC 4.12 3.77 - 5.28 x10E6/uL   Hemoglobin 13.0 11.1 - 15.9 g/dL   Hematocrit 39.1 34.0 - 46.6 %   MCV 95 79 - 97 fL   MCH 31.6 26.6 - 33.0 pg   MCHC 33.2 31.5 - 35.7 g/dL   RDW 13.0 12.3 - 15.4 %   Platelets 197 150 - 450 x10E3/uL   Neutrophils 71 Not Estab. %   Lymphs 16 Not Estab. %   Monocytes 8 Not Estab. %   Eos 5 Not Estab. %   Basos 0 Not Estab. %   Neutrophils Absolute 4.3 1.4 - 7.0 x10E3/uL   Lymphocytes Absolute 1.0 0.7 - 3.1 x10E3/uL   Monocytes Absolute 0.5 0.1 - 0.9 x10E3/uL   EOS (ABSOLUTE) 0.3 0.0 - 0.4 x10E3/uL   Basophils Absolute 0.0 0.0 - 0.2 x10E3/uL   Immature Granulocytes 0 Not Estab. %   Immature Grans (Abs) 0.0 0.0 - 0.1 x10E3/uL       Pertinent labs & imaging results that were available during my care of the patient were reviewed by me and considered in my medical decision making.  Assessment & Plan:  Cynthia Chavez was seen today for medical management of chronic issues.  Diagnoses and all orders for this visit:  Acquired hypothyroidism Labs pending. Will refill medications once labs have resulted.  -     TSH  Non-seasonal allergic rhinitis due to pollen Medications as prescribed. Report any new or worsening symptoms.  -     loratadine (CLARITIN) 10 MG tablet; Take 1 tablet (10 mg total) by mouth daily. -     azelastine (ASTELIN) 0.1 % nasal spray; Place 1 spray into both nostrils 2 (two) times daily. Use in each nostril as directed  Atopic dermatitis, unspecified type Avoid scratching or picking at area. Use jar emollients. Medications as prescribed. Report any new or worsening symptoms.  -     triamcinolone cream (KENALOG) 0.1 %; Apply 1 application topically 2 (two) times daily.     Continue all other maintenance medications.  Follow up plan: Return in about 6 months (around 05/13/2019) for Thyroid.   The  above assessment and management plan was discussed with the patient. The patient verbalized understanding of and has agreed to the management plan. Patient is aware to call the clinic if symptoms persist or worsen. Patient is aware when to return to the clinic for a follow-up visit. Patient educated on when it is appropriate to go to the emergency department.   Monia Pouch, FNP-C [facility] [phone]

## 2018-11-11 NOTE — Addendum Note (Signed)
Addended by: Baruch Gouty on: 11/11/2018 03:27 PM   Modules accepted: Orders

## 2018-11-12 ENCOUNTER — Ambulatory Visit: Payer: BLUE CROSS/BLUE SHIELD | Admitting: Family Medicine

## 2018-11-12 LAB — TSH: TSH: 10.77 u[IU]/mL — ABNORMAL HIGH (ref 0.450–4.500)

## 2018-11-12 MED ORDER — LEVOTHYROXINE SODIUM 175 MCG PO TABS: 175.0000 ug | ORAL_TABLET | Freq: Every day | ORAL | 3 refills | Status: DC

## 2018-11-12 NOTE — Addendum Note (Signed)
Addended by: Baruch Gouty on: 11/12/2018 07:57 AM   Modules accepted: Orders

## 2018-11-12 NOTE — Addendum Note (Signed)
Addended by: Karle Plumber on: 11/12/2018 08:28 AM   Modules accepted: Orders

## 2018-11-14 ENCOUNTER — Ambulatory Visit (INDEPENDENT_AMBULATORY_CARE_PROVIDER_SITE_OTHER): Payer: Medicare HMO | Admitting: Family Medicine

## 2018-11-14 ENCOUNTER — Other Ambulatory Visit: Payer: Self-pay

## 2018-11-14 ENCOUNTER — Encounter: Payer: Self-pay | Admitting: Family Medicine

## 2018-11-14 DIAGNOSIS — B379 Candidiasis, unspecified: Secondary | ICD-10-CM | POA: Diagnosis not present

## 2018-11-14 DIAGNOSIS — H9202 Otalgia, left ear: Secondary | ICD-10-CM

## 2018-11-14 MED ORDER — FLUCONAZOLE 150 MG PO TABS: 150.0000 mg | ORAL_TABLET | Freq: Once | ORAL | 0 refills | Status: AC

## 2018-11-14 MED ORDER — AMOXICILLIN 875 MG PO TABS: 875.0000 mg | ORAL_TABLET | Freq: Two times a day (BID) | ORAL | 0 refills | Status: AC

## 2018-11-14 NOTE — Progress Notes (Signed)
Virtual Visit via telephone Note Due to COVID-19, visit is conducted virtually and was requested by patient.  I connected with Cynthia Chavez on 11/14/18 at 1035 by telephone and verified that I am speaking with the correct person using two identifiers. Cynthia Chavez is currently located at home and family is currently with them during visit. The provider, Monia Pouch, FNP is located in their office at time of visit.  I discussed the limitations, risks, security and privacy concerns of performing an evaluation and management service by telephone and the availability of in person appointments. I also discussed with the patient that there may be a patient responsible charge related to this service. The patient expressed understanding and agreed to proceed.  Subjective:  Patient ID: Cynthia Chavez, female    DOB: 1956/01/26, 63 y.o.   MRN: [id number]  Chief Complaint:  Ear Pain   HPI: Cynthia Chavez is a 63 y.o. female presenting on 11/14/2018 for Ear Pain   Pt reports ongoing left ear pain. Pt has been using her nasal spray and over claritin without relief of pain and popping sounds. She states the right ear has cleared up but the left ear is hurting pretty bad. States throbbing, 6/10. She has not measured her temperature at home. States she does have chills at times. No other associated symptoms.    Relevant past medical, surgical, family, and social history reviewed and updated as indicated.  Allergies and medications reviewed and updated.   Past Medical History:  Diagnosis Date  . Anxiety   . Breast cancer ([place])   . Cancer [facility])    breast cancer  . Depression   . Edema    bilateral feet and leg swelling  . GERD (gastroesophageal reflux disease)   . History of hiatal hernia   . History of radiation therapy 02/12/17- 03/26/17   Chest wall and regional nodes, right- 50 Gy in 25 fractions, Chest Wall, boost, right- 10 Gy in 5 fractions.   . Hypothyroidism   . Malaise   . Personal  history of chemotherapy   . Personal history of radiation therapy   . Thyroid disease   . Varicose veins     Past Surgical History:  Procedure Laterality Date  . ABDOMINAL HYSTERECTOMY    . AXILLARY LYMPH NODE BIOPSY Right 01/03/2017   Procedure: RIGHT AXILLARY LYMPH NODE SEED GUIDED EXCISIONAL BIOPSY;  Surgeon: Rolm Bookbinder, MD;  Location: [place];  Service: General;  Laterality: Right;  . CHOLECYSTECTOMY    . COLONOSCOPY    . ESOPHAGEAL DILATION    . MASTECTOMY Right   . MASTECTOMY W/ SENTINEL NODE BIOPSY Right 01/03/2017   axillary  . MASTECTOMY W/ SENTINEL NODE BIOPSY Right 01/03/2017   Procedure: RIGHT TOTAL MASTECTOMY WITH RIGHT AXILLARY SENTINEL LYMPH NODE BIOPSY;  Surgeon: Rolm Bookbinder, MD;  Location: [place];  Service: General;  Laterality: Right;  . PORTACATH PLACEMENT Left 08/17/2016   Procedure: INSERTION PORT-A-CATH WITH [place];  Surgeon: Rolm Bookbinder, MD;  Location: [place];  Service: General;  Laterality: Left;    Social History   Socioeconomic History  . Marital status: Married    Spouse name: Not on file  . Number of children: Not on file  . Years of education: Not on file  . Highest education level: Not on file  Occupational History  . Not on file  Social Needs  . Financial resource strain: Not on file  . Food insecurity:    Worry: Not  on file    Inability: Not on file  . Transportation needs:    Medical: Not on file    Non-medical: Not on file  Tobacco Use  . Smoking status: Current Every Day Smoker    Packs/day: 0.50    Years: 25.00    Pack years: 12.50    Types: Cigarettes    Start date: 04/19/1993  . Smokeless tobacco: Never Used  . Tobacco comment: she stopped smoking for a long time, but started back.   Substance and Sexual Activity  . Alcohol use: No  . Drug use: No  . Sexual activity: Not on file  Lifestyle  . Physical activity:    Days per week: Not on file    Minutes per session: Not on file  . Stress: Not on  file  Relationships  . Social connections:    Talks on phone: Not on file    Gets together: Not on file    Attends religious service: Not on file    Active member of club or organization: Not on file    Attends meetings of clubs or organizations: Not on file    Relationship status: Not on file  . Intimate partner violence:    Fear of current or ex partner: Not on file    Emotionally abused: Not on file    Physically abused: Not on file    Forced sexual activity: Not on file  Other Topics Concern  . Not on file  Social History Narrative  . Not on file    Outpatient Encounter Medications as of 11/14/2018  Medication Sig  . amoxicillin (AMOXIL) 875 MG tablet Take 1 tablet (875 mg total) by mouth 2 (two) times daily for 10 days. 1 po BID  . azelastine (ASTELIN) 0.1 % nasal spray Place 1 spray into both nostrils 2 (two) times daily. Use in each nostril as directed  . fluconazole (DIFLUCAN) 150 MG tablet Take 1 tablet (150 mg total) by mouth once for 1 dose.  . levothyroxine (SYNTHROID, LEVOTHROID) 175 MCG tablet Take 1 tablet (175 mcg total) by mouth daily before breakfast for 30 days.  Marland Kitchen loratadine (CLARITIN) 10 MG tablet Take 1 tablet (10 mg total) by mouth daily.  . tamoxifen (NOLVADEX) 20 MG tablet Take 1 tablet (20 mg total) by mouth daily.  Marland Kitchen triamcinolone cream (KENALOG) 0.1 % Apply 1 application topically 2 (two) times daily.   No facility-administered encounter medications on file as of 11/14/2018.     Allergies  Allergen Reactions  . Ivp Dye [Iodinated Diagnostic Agents] Other (See Comments)    Chest pain  . Other Shortness Of Breath, Swelling and Other (See Comments)    Versed or Fentanyl when used for endoscopy caused throat and tongue swelling  . Dilaudid [Hydromorphone Hcl]     PT had CP and felt shaky. Didn't like the feeling, but sx lessened w/in 10 min of IV administration  . Asa [Aspirin] Other (See Comments)    spasms  . Codeine Other (See Comments)    spasms     Review of Systems  Constitutional: Positive for chills. Negative for fatigue and fever.  HENT: Positive for ear pain. Negative for congestion, postnasal drip, rhinorrhea, sinus pressure, sinus pain, sneezing, sore throat and trouble swallowing.   Respiratory: Negative for cough and shortness of breath.   Cardiovascular: Negative for chest pain and palpitations.  Neurological: Negative for dizziness, light-headedness and headaches.  Psychiatric/Behavioral: Negative for confusion.  Observations/Objective: No vital signs or physical exam, this was a telephone or virtual health encounter.  Pt alert and oriented, answers all questions appropriately, and able to speak in full sentences.    Assessment and Plan: Cynthia Chavez was seen today for ear pain.  Diagnoses and all orders for this visit:  Acute otalgia, left Due to ongoing pain despite symptomatic care, will treat with amoxil. Medications as prescribed. Report any new or worsening symptoms.  -     amoxicillin (AMOXIL) 875 MG tablet; Take 1 tablet (875 mg total) by mouth 2 (two) times daily for 10 days. 1 po BID  Yeast infection Recurrent due to chemotherapy and frequent with antibiotic use. Pt aware how to dose if symptoms arise.  -     fluconazole (DIFLUCAN) 150 MG tablet; Take 1 tablet (150 mg total) by mouth once for 1 dose.     Follow Up Instructions: Return if symptoms worsen or fail to improve.    I discussed the assessment and treatment plan with the patient. The patient was provided an opportunity to ask questions and all were answered. The patient agreed with the plan and demonstrated an understanding of the instructions.   The patient was advised to call back or seek an in-person evaluation if the symptoms worsen or if the condition fails to improve as anticipated.  The above assessment and management plan was discussed with the patient. The patient verbalized understanding of and has agreed to the management plan.  Patient is aware to call the clinic if symptoms persist or worsen. Patient is aware when to return to the clinic for a follow-up visit. Patient educated on when it is appropriate to go to the emergency department.    I provided 15 minutes of non-face-to-face time during this encounter. The call started at 1035. The call ended at 1045.   Monia Pouch, FNP-C [facility] [address] [place], [place] [address] [phone]

## 2018-11-19 ENCOUNTER — Telehealth: Payer: Self-pay | Admitting: Family Medicine

## 2018-11-19 NOTE — Telephone Encounter (Signed)
PT is wanting to know what her thyroid level was before and what it is now, she is wanting to know if the antibiotic could mess with her numbers because she just feels bad

## 2018-11-19 NOTE — Telephone Encounter (Signed)
Aware.  Other medications can affect absorbtion of thyroid medications. Continue with all medications and schedule for a thyroid recheck within two or three months with pcp.

## 2018-11-19 NOTE — Telephone Encounter (Signed)
Phone call to patient, no answer, voicemail full

## 2018-12-13 ENCOUNTER — Telehealth: Payer: Self-pay | Admitting: *Deleted

## 2018-12-13 NOTE — Telephone Encounter (Signed)
Pt called with complaint of left breast pain and swelling.  Pt denies signs of infection including redness, warmth, drainage, fever.  Pt denise swelling in her arm as well.  Pt states she experienced this while taking anastrozole and when she stopped for 2 weeks, symptoms resolved.  Pt currently on Tamoxifen and per Dr. Lindi Adie pt to cut dosage to half a tablet of Tamoxifen a day to see if this improves her symptoms.  Pt verbalized understanding and will call if continues to experience left breast swelling.

## 2018-12-16 IMAGING — MR MR BILATERAL BREAST WITHOUT AND WITH CONTRAST
8 of 12 series · 32 of 48 positions shown · IV contrast (15ml Multihance)
Comparison: Previous exam(s).

CLINICAL DATA: 60-year-old female with recently diagnosed invasive
ductal carcinoma of the right breast by biopsy of masses at 11
o'clock and 2 o'clock. In addition, biopsy of a right axillary lymph
node demonstrated metastatic carcinoma.

LABS:  Creatinine was obtained on site at [HOSPITAL] at [REDACTED] [HOSPITAL].
Results: Creatinine 0.6 mg/dL.
EXAM:
BILATERAL BREAST MRI WITH AND WITHOUT CONTRAST
TECHNIQUE: Multiplanar, multisequence MR images of both breasts were obtained
prior to and following the intravenous administration of 15 ml of
MultiHance.

[Series 2: t2_tirm_tra ipat (a-p) · axial · 3.0mm · 0.70mm/px · 1 of 60 slices shown]
[im 1/60]
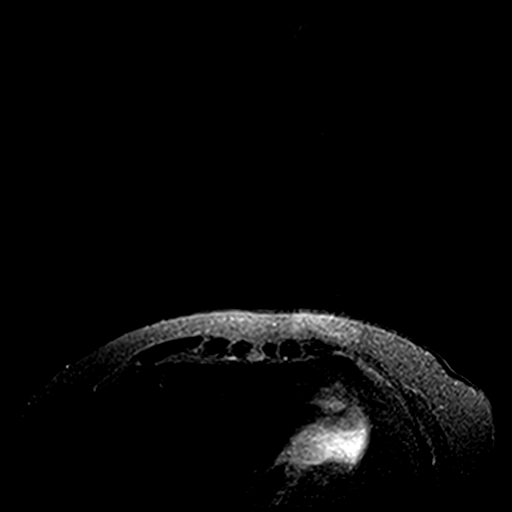

[Series 3: fl3d pre-cm no · axial · non-contrast · 1.2mm · 0.94mm/px · z∈[-78,+113]mm · 5 of 160 slices shown]
[im 1/160]
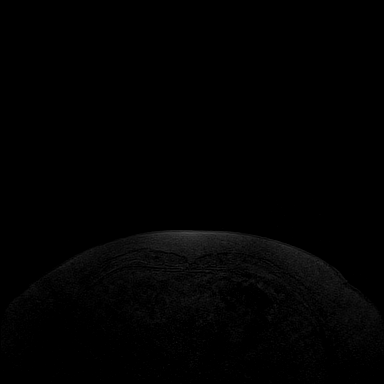
[im 40/160]
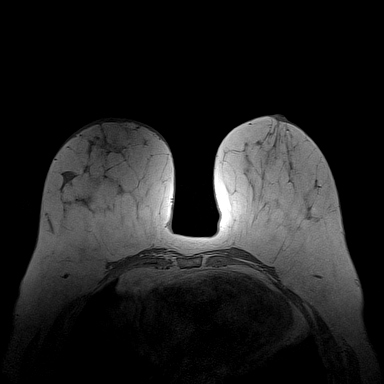
[im 80/160]
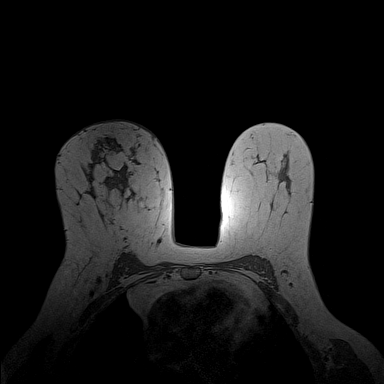
[im 120/160]
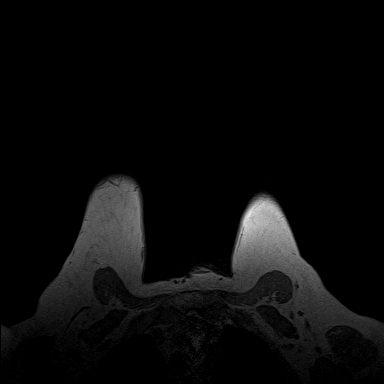
[im 160/160]
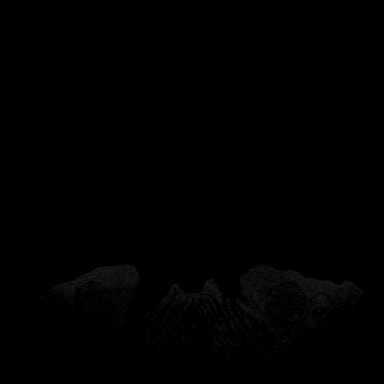

[Series 4: fl3d pre-cm · axial · non-contrast · 1.2mm · 0.94mm/px · z∈[-78,+113]mm · 5 of 160 slices shown]
[im 1/160]
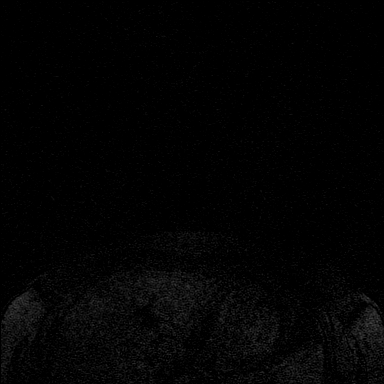
[im 40/160]
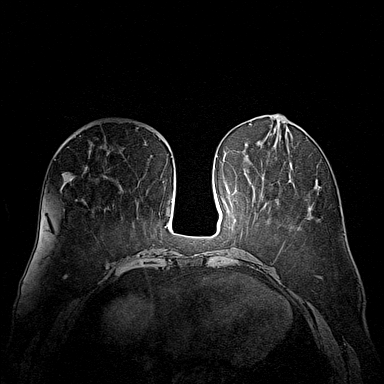
[im 80/160]
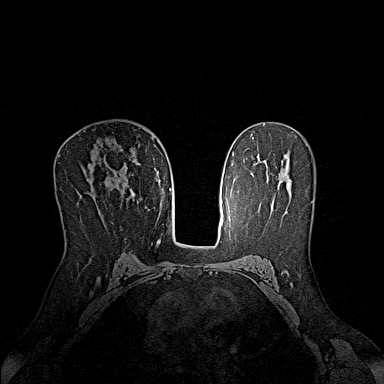
[im 120/160]
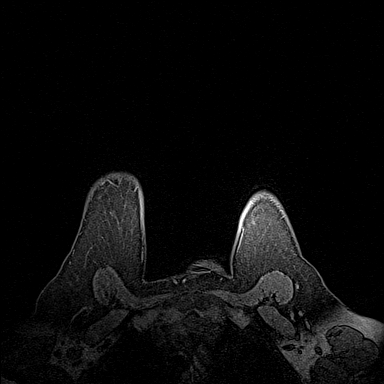
[im 160/160]
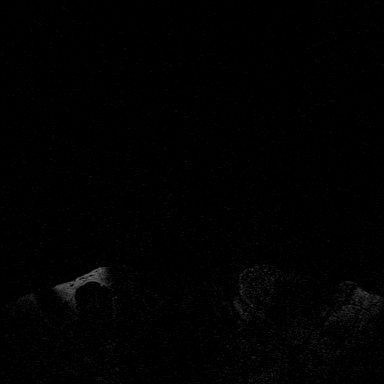

[Series 5: fl3d post-cm 20 · axial · 1.2mm · 0.94mm/px · z∈[-78,+113]mm · 5 of 160 slices shown (1 of 3)]
[im 1/160]
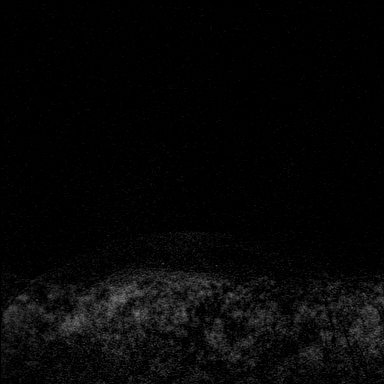
[im 40/160]
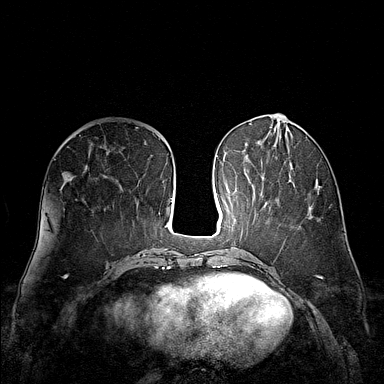
[im 80/160]
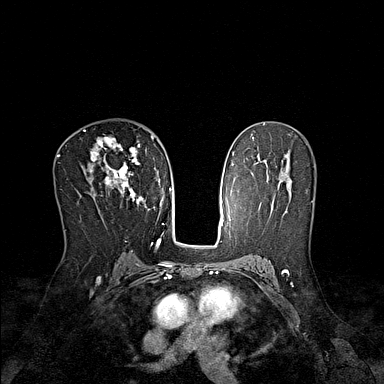
[im 120/160]
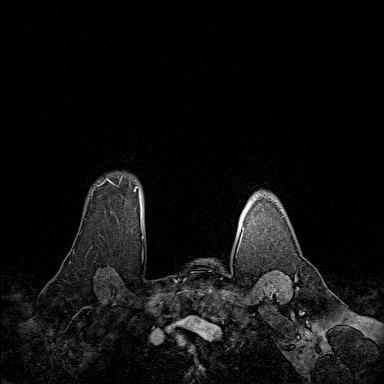
[im 160/160]
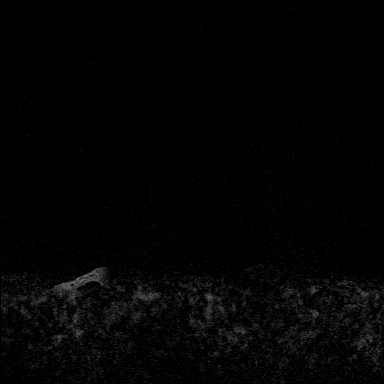

[Series 6: fl3d post-cm 20 · axial · 1.2mm · 0.94mm/px · z∈[-78,+113]mm · 5 of 160 slices shown (2 of 3)]
[im 1/160]
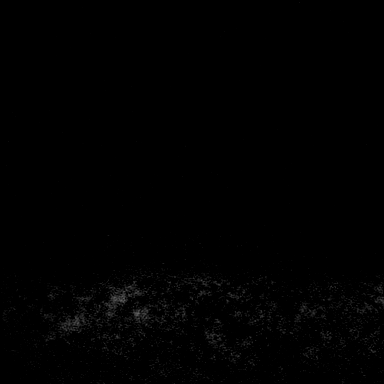
[im 40/160]
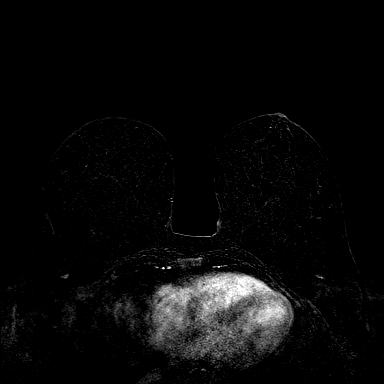
[im 80/160]
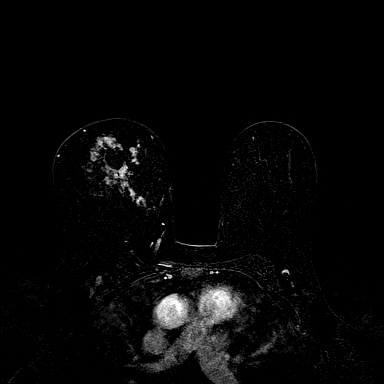
[im 120/160]
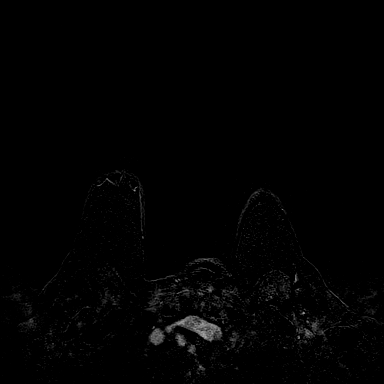
[im 160/160]
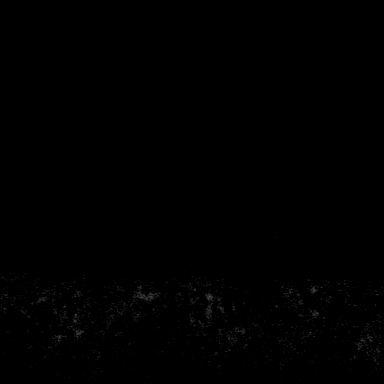

[Series 7: fl3d post-cm 20 · axial · 192.0mm · 0.94mm/px · 1 of 1 slices shown (3 of 3)]
[im 1/1]
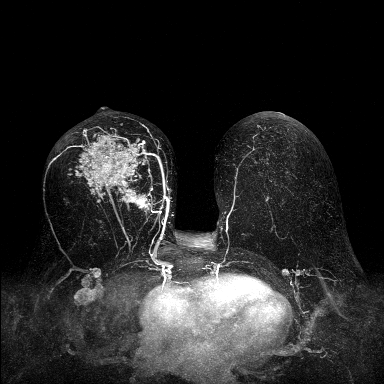

[Series 8: fl3d post-cm 3min · axial · 1.2mm · 0.94mm/px · z∈[-78,+113]mm · 6 of 160 slices shown]
[im 1/160]
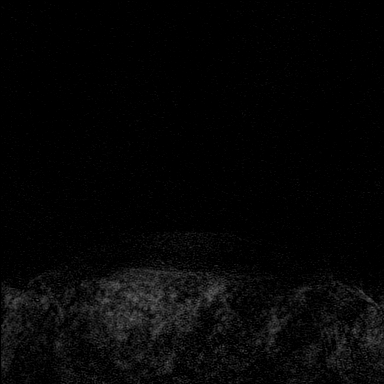
[im 32/160]
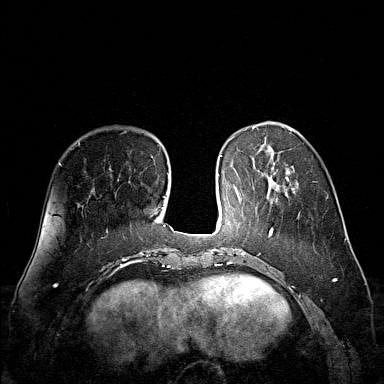
[im 64/160]
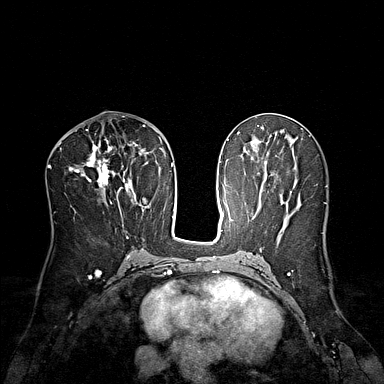
[im 96/160]
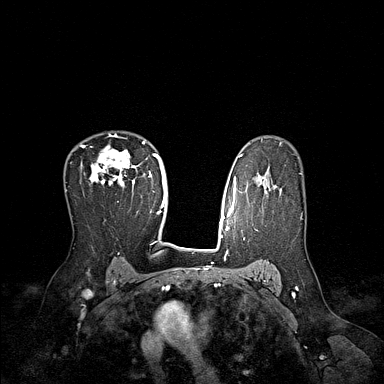
[im 128/160]
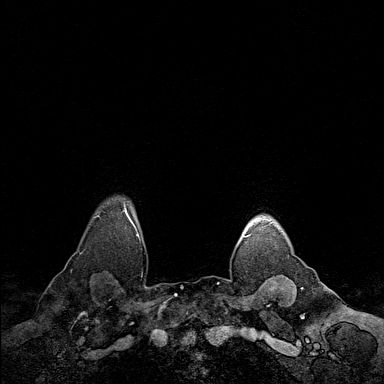
[im 160/160]
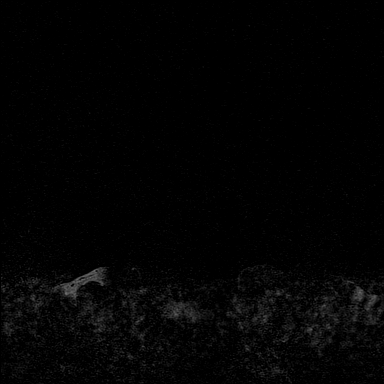

[Series 9: fl3d post-cm 3min_sub · axial · 1.2mm · 0.94mm/px · z∈[-78,+36]mm · 4 of 160 slices shown]
[im 1/160]
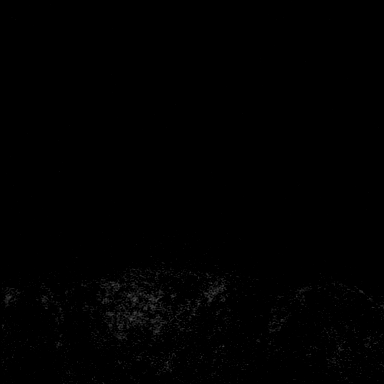
[im 32/160]
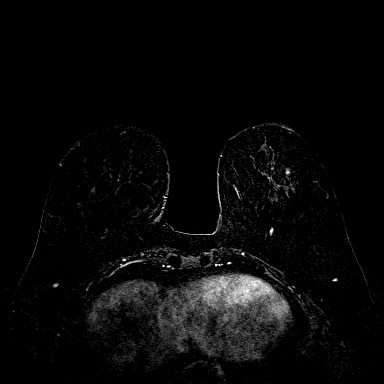
[im 64/160]
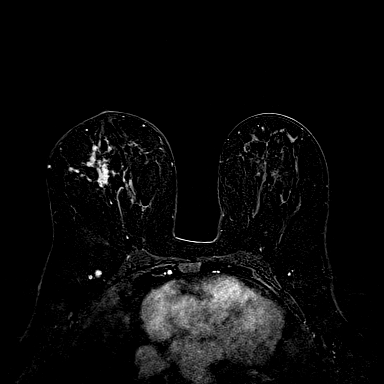
[im 96/160]
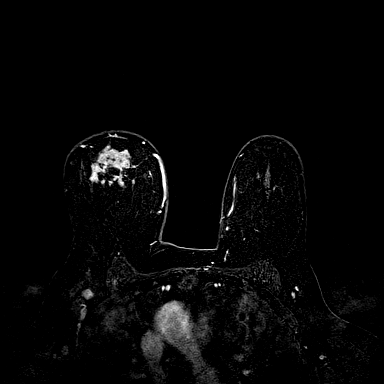

[32 of 48 positions shown; findings below may reference images not displayed]

THREE-DIMENSIONAL MR IMAGE RENDERING ON INDEPENDENT WORKSTATION:

Three-dimensional MR images were rendered by post-processing of the
original MR data on an independent workstation. The
three-dimensional MR images were interpreted, and findings are
reported in the following complete MRI report for this study. Three
dimensional images were evaluated at the independent [employer]
workstation
FINDINGS: Breast composition: c. Heterogeneous fibroglandular tissue.

Background parenchymal enhancement: Minimal.

Right breast: There is extensive mass and non mass enhancement
throughout the upper, outer and upper, inner right breast measuring
8.5 x 7.5 x 7.4 cm (AP x TR x CC). The non mass enhancement does
extend into the upper aspect of the lower, outer quadrant. Biopsy
marker artifact is seen within the right breast at 11 o'clock on
series 3, image 69 and at 2 o'clock on series 3, image 89. There is
diffuse right skin thickening without associated enhancement.

Left breast: No suspicious mass or enhancement.

Lymph nodes: There are multiple abnormal enlarged right axillary
lymph nodes, the largest measuring 2.4 cm in greatest size.Probable
biopsy marker artifact from the right axillary lymph node biopsy is
seen along the anterior aspect of the largest lymph node on series
3, image 60.

Ancillary findings:  None.
IMPRESSION: 1. Known right breast carcinoma and axillary metastatic disease.
2. No MRI evidence of left breast malignancy.

RECOMMENDATION:
Treatment plan.

BI-RADS CATEGORY  6: Known biopsy-proven malignancy.

## 2018-12-20 IMAGING — CR DG CHEST 1V PORT
1 series · 1 of 1 positions shown · non-contrast
Comparison: Portable exam 2557 hours without priors for comparison

CLINICAL DATA: Port-A-Cath placement

EXAM:
PORTABLE CHEST 1 VIEW

[AP]
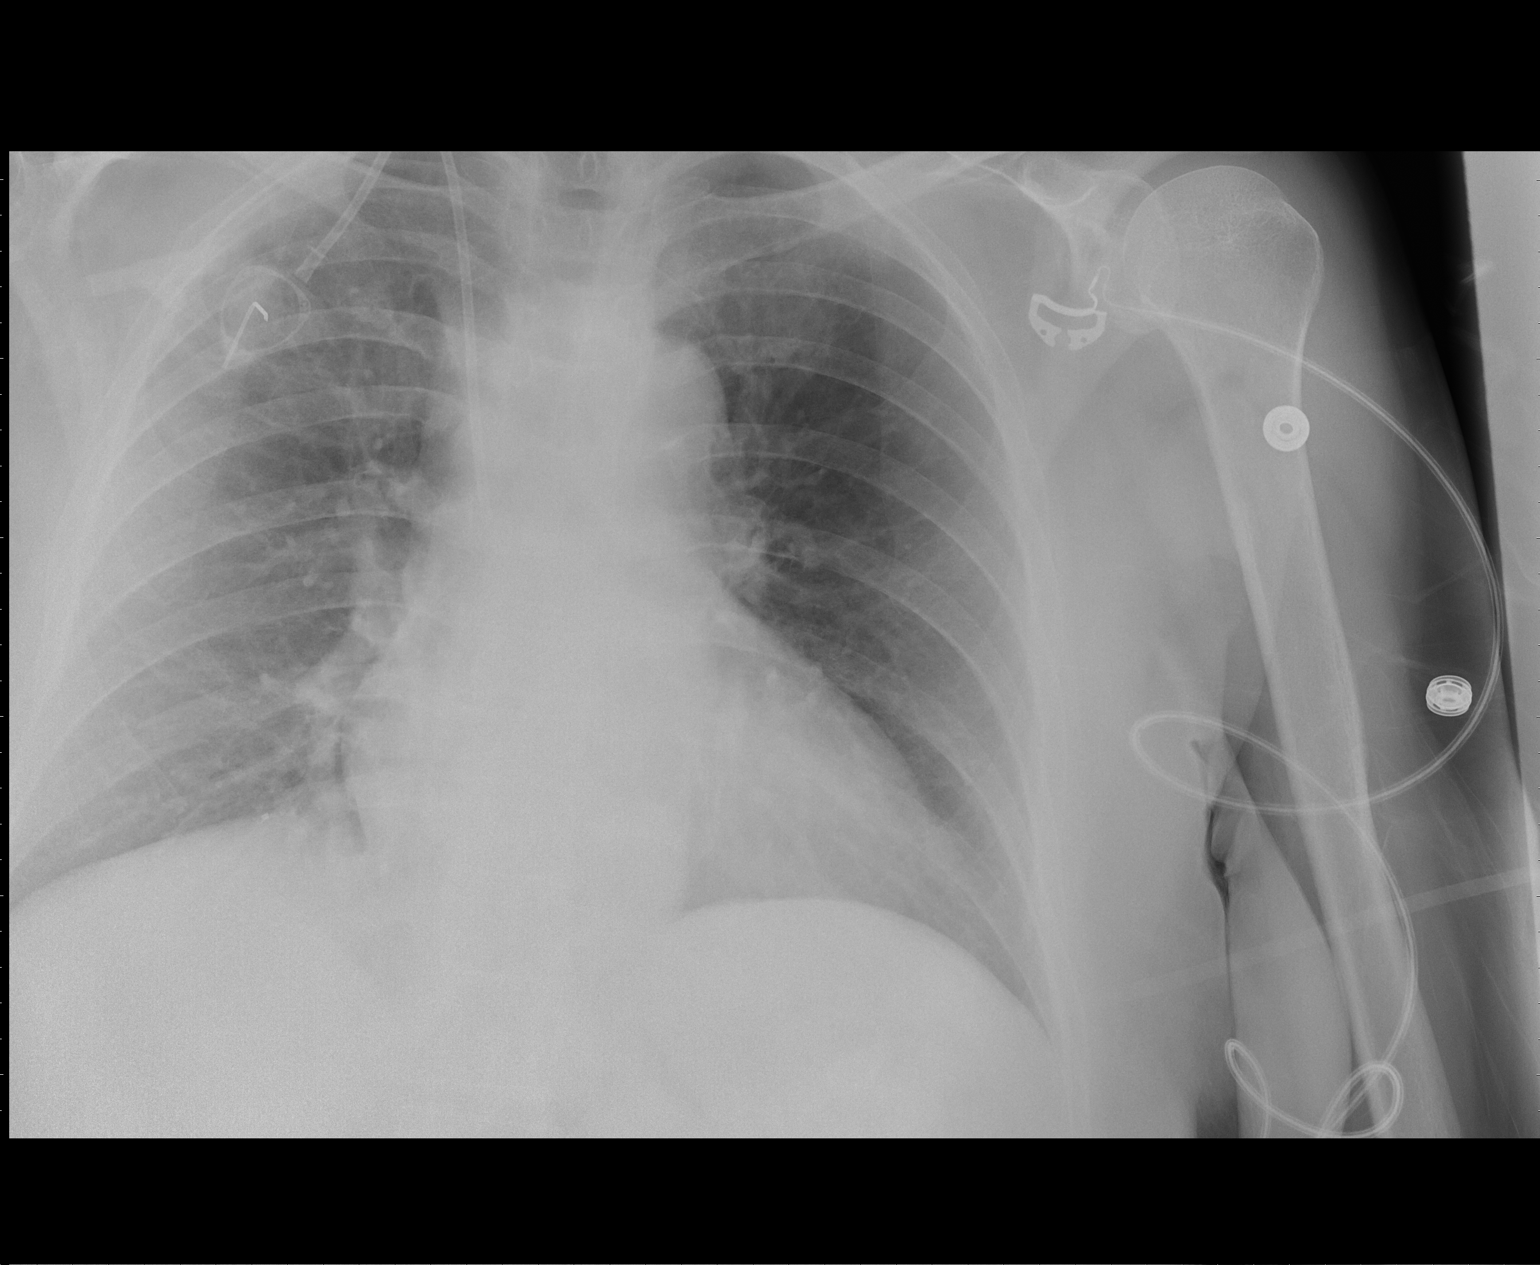

[1 of 1 positions shown; findings below may reference images not displayed]

FINDINGS: RIGHT jugular power port with tip projecting over SVC.

Upper normal heart size.

Mediastinal contours and pulmonary vascularity normal.

Lungs clear.

No pleural effusion or pneumothorax.
IMPRESSION: No pneumothorax following Port-A-Cath placement.

## 2018-12-20 IMAGING — NM NM BONE WHOLE BODY
2 series · 2 of 2 positions shown · non-contrast
Comparison: CT scan 08/17/2016

CLINICAL DATA: Right breast cancer

EXAM:
NUCLEAR MEDICINE WHOLE BODY BONE SCAN
TECHNIQUE: Whole body anterior and posterior images were obtained approximately
3 hours after intravenous injection of radiopharmaceutical.
RADIOPHARMACEUTICALS:  Twenty-five mCi Yechnetium-QQm MDP IV

[Series 1: wbr_bone_60 whole body · 2.66mm/px · 1 of 1 slices shown (1 of 2)]
[im 1/1]
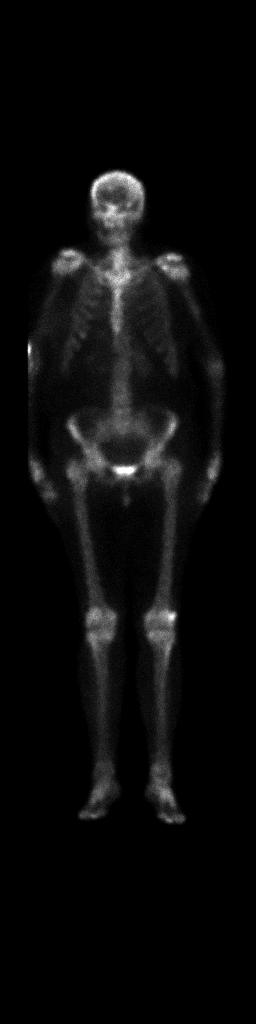

[Series 1: wbr_bone_60 whole body · 2.66mm/px · 1 of 1 slices shown (2 of 2)]
[im 1/1]
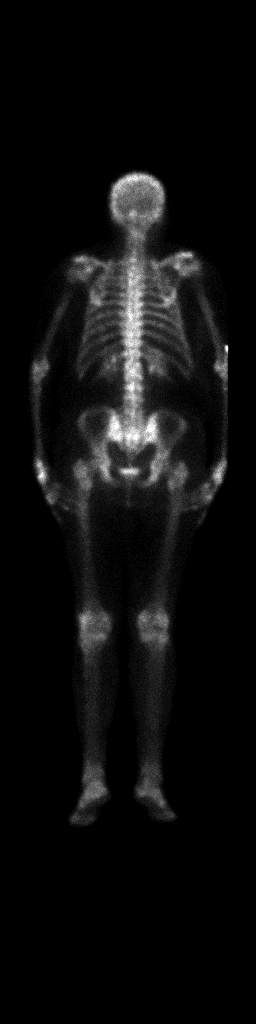

[2 of 2 positions shown; findings below may reference images not displayed]

FINDINGS: A whole-body bone scan was performed. There is homogeneous uptake of
the tracer within bony structures without evidence of metastatic
disease. Mild focal increased activity superolateral aspect of the
left patella probable degenerative in nature.
IMPRESSION: No evidence of metastatic disease. Probable degenerative changes
left knee.

## 2018-12-25 ENCOUNTER — Telehealth: Payer: Self-pay | Admitting: Family Medicine

## 2018-12-25 NOTE — Telephone Encounter (Signed)
Patient aware will need a follow up with provider  and thyroid lab work around first of July.

## 2019-01-19 ENCOUNTER — Other Ambulatory Visit: Payer: Self-pay | Admitting: Family Medicine

## 2019-01-19 DIAGNOSIS — E039 Hypothyroidism, unspecified: Secondary | ICD-10-CM

## 2019-02-18 ENCOUNTER — Other Ambulatory Visit: Payer: Medicare HMO

## 2019-02-18 ENCOUNTER — Other Ambulatory Visit: Payer: Self-pay | Admitting: Family Medicine

## 2019-02-18 ENCOUNTER — Other Ambulatory Visit: Payer: Self-pay

## 2019-02-18 DIAGNOSIS — E039 Hypothyroidism, unspecified: Secondary | ICD-10-CM | POA: Diagnosis not present

## 2019-02-19 LAB — THYROID PANEL WITH TSH
Free Thyroxine Index: 3.5 (ref 1.2–4.9)
T3 Uptake Ratio: 29 % (ref 24–39)
T4, Total: 11.9 ug/dL (ref 4.5–12.0)
TSH: 0.103 u[IU]/mL — ABNORMAL LOW (ref 0.450–4.500)

## 2019-02-20 ENCOUNTER — Other Ambulatory Visit: Payer: Self-pay | Admitting: *Deleted

## 2019-02-20 DIAGNOSIS — E039 Hypothyroidism, unspecified: Secondary | ICD-10-CM

## 2019-02-20 MED ORDER — LEVOTHYROXINE SODIUM 150 MCG PO TABS: 150.0000 ug | ORAL_TABLET | Freq: Every day | ORAL | 1 refills | Status: DC

## 2019-02-21 ENCOUNTER — Other Ambulatory Visit: Payer: Self-pay | Admitting: *Deleted

## 2019-02-21 DIAGNOSIS — E039 Hypothyroidism, unspecified: Secondary | ICD-10-CM

## 2019-04-24 ENCOUNTER — Other Ambulatory Visit: Payer: Medicare HMO

## 2019-04-24 ENCOUNTER — Other Ambulatory Visit: Payer: Self-pay

## 2019-04-24 DIAGNOSIS — E039 Hypothyroidism, unspecified: Secondary | ICD-10-CM

## 2019-04-25 LAB — THYROID PANEL WITH TSH
Free Thyroxine Index: 2.3 (ref 1.2–4.9)
T3 Uptake Ratio: 24 % (ref 24–39)
T4, Total: 9.7 ug/dL (ref 4.5–12.0)
TSH: 1.1 u[IU]/mL (ref 0.450–4.500)

## 2019-05-02 ENCOUNTER — Telehealth: Payer: Self-pay | Admitting: Family Medicine

## 2019-05-02 DIAGNOSIS — E039 Hypothyroidism, unspecified: Secondary | ICD-10-CM

## 2019-05-02 MED ORDER — LEVOTHYROXINE SODIUM 150 MCG PO TABS: 150.0000 ug | ORAL_TABLET | Freq: Every day | ORAL | 1 refills | Status: DC

## 2019-05-02 NOTE — Telephone Encounter (Signed)
These were released to mychart today.  Her thyroid levels were normal.

## 2019-05-02 NOTE — Telephone Encounter (Signed)
Aware of results. 

## 2019-06-06 ENCOUNTER — Other Ambulatory Visit: Payer: Self-pay

## 2019-06-06 ENCOUNTER — Encounter: Payer: Self-pay | Admitting: Nurse Practitioner

## 2019-06-06 ENCOUNTER — Ambulatory Visit (INDEPENDENT_AMBULATORY_CARE_PROVIDER_SITE_OTHER): Payer: Medicare HMO | Admitting: Nurse Practitioner

## 2019-06-06 DIAGNOSIS — H9203 Otalgia, bilateral: Secondary | ICD-10-CM | POA: Diagnosis not present

## 2019-06-06 DIAGNOSIS — J0100 Acute maxillary sinusitis, unspecified: Secondary | ICD-10-CM | POA: Diagnosis not present

## 2019-06-06 MED ORDER — AMOXICILLIN-POT CLAVULANATE 875-125 MG PO TABS: 1.0000 | ORAL_TABLET | Freq: Two times a day (BID) | ORAL | 0 refills | Status: DC

## 2019-06-06 NOTE — Progress Notes (Signed)
Virtual Visit via telephone Note Due to COVID-19 pandemic this visit was conducted virtually. This visit type was conducted due to national recommendations for restrictions regarding the COVID-19 Pandemic (e.g. social distancing, sheltering in place) in an effort to limit this patient's exposure and mitigate transmission in our community. All issues noted in this document were discussed and addressed.  A physical exam was not performed with this format.  I connected with Cynthia Chavez on 06/06/19 at 8:20 by telephone and verified that I am speaking with the correct person using two identifiers. Cynthia Chavez is currently located at home and her husband is currently with none during visit. The provider, Mary-Margaret Hassell Done, FNP is located in their office at time of visit.  I discussed the limitations, risks, security and privacy concerns of performing an evaluation and management service by telephone and the availability of in person appointments. I also discussed with the patient that there may be a patient responsible charge related to this service. The patient expressed understanding and agreed to proceed.   History and Present Illness:   Chief Complaint: Ear Pain   HPI Patient calls in c/o of cough and congestion for 8days. Woke up this morning with both ears hurting. She has slight headache. No fever.   Review of Systems  Constitutional: Negative for chills and fever.  HENT: Positive for congestion and ear pain.   Respiratory: Positive for cough.   Cardiovascular: Negative for chest pain.  Genitourinary: Negative.   Neurological: Positive for headaches.  Psychiatric/Behavioral: Positive for depression.  All other systems reviewed and are negative.    Observations/Objective: Alert and oriented- answers all questions appropriately No distress Voice hoarse Ears pop when swallowing    Assessment and Plan: Cynthia Chavez in today with chief complaint of Ear Pain   1.  Acute maxillary sinusitis, recurrence not specified - amoxicillin-clavulanate (AUGMENTIN) 875-125 MG tablet; Take 1 tablet by mouth 2 (two) times daily.  Dispense: 14 tablet; Refill: 0  2. Otalgia of both ears 1. Take meds as prescribed 2. Use a cool mist humidifier especially during the winter months and when heat has been humid. 3. Use saline nose sprays frequently 4. Saline irrigations of the nose can be very helpful if done frequently.  * 4X daily for 1 week*  * Use of a nettie pot can be helpful with this. Follow directions with this* 5. Drink plenty of fluids 6. Keep thermostat turn down low 7.For any cough or congestion  Use plain Mucinex- regular strength or max strength is fine   * Children- consult with Pharmacist for dosing 8. For fever or aces or pains- take tylenol or ibuprofen appropriate for age and weight.  * for fevers greater than 101 orally you may alternate ibuprofen and tylenol every  3 hours.      Follow Up Instructions: prn    I discussed the assessment and treatment plan with the patient. The patient was provided an opportunity to ask questions and all were answered. The patient agreed with the plan and demonstrated an understanding of the instructions.   The patient was advised to call back or seek an in-person evaluation if the symptoms worsen or if the condition fails to improve as anticipated.  The above assessment and management plan was discussed with the patient. The patient verbalized understanding of and has agreed to the management plan. Patient is aware to call the clinic if symptoms persist or worsen. Patient is aware when to return to the clinic  for a follow-up visit. Patient educated on when it is appropriate to go to the emergency department.   Time call ended:  8:32  I provided 12 minutes of non-face-to-face time during this encounter.    Mary-Margaret Hassell Done, FNP

## 2019-06-09 ENCOUNTER — Telehealth: Payer: Self-pay | Admitting: Family Medicine

## 2019-06-11 ENCOUNTER — Telehealth: Payer: Self-pay | Admitting: Family Medicine

## 2019-06-11 ENCOUNTER — Ambulatory Visit (INDEPENDENT_AMBULATORY_CARE_PROVIDER_SITE_OTHER): Payer: Medicare HMO | Admitting: Family Medicine

## 2019-06-11 ENCOUNTER — Encounter: Payer: Self-pay | Admitting: Family Medicine

## 2019-06-11 DIAGNOSIS — T3695XA Adverse effect of unspecified systemic antibiotic, initial encounter: Secondary | ICD-10-CM

## 2019-06-11 DIAGNOSIS — J019 Acute sinusitis, unspecified: Secondary | ICD-10-CM

## 2019-06-11 DIAGNOSIS — B379 Candidiasis, unspecified: Secondary | ICD-10-CM | POA: Diagnosis not present

## 2019-06-11 MED ORDER — FLUCONAZOLE 150 MG PO TABS: 150.0000 mg | ORAL_TABLET | Freq: Once | ORAL | 0 refills | Status: AC

## 2019-06-11 MED ORDER — DOXYCYCLINE HYCLATE 100 MG PO TABS: 100.0000 mg | ORAL_TABLET | Freq: Two times a day (BID) | ORAL | 0 refills | Status: DC

## 2019-06-11 NOTE — Telephone Encounter (Signed)
Please advise 

## 2019-06-11 NOTE — Progress Notes (Signed)
Virtual Visit via Telephone Note  I connected with Cynthia Chavez on 06/11/19 at 8:55 AM by telephone and verified that I am speaking with the correct person using two identifiers. Cynthia Chavez is currently located at work and nobody is currently with her during this visit. The provider, Loman Brooklyn, FNP is located in their home at time of visit.  I discussed the limitations, risks, security and privacy concerns of performing an evaluation and management service by telephone and the availability of in person appointments. I also discussed with the patient that there may be a patient responsible charge related to this service. The patient expressed understanding and agreed to proceed.  Subjective: PCP: Janora Norlander, DO  Chief Complaint  Patient presents with  . Otalgia  . Vaginitis   Patient complains of cough, head congestion, ear pain/pressure and facial pain/pressure. Onset of symptoms was 2 weeks ago, gradually worsening since that time. She is drinking plenty of fluids. Evaluation to date: patient had a telephone visit five days ago at which time she was given Augmentin. She called over the weekend to report the Augmentin had caused her to have spasms. She reports being told to start taking Nexium to treat the spasms. She is afraid to complete Augmentin due to spasms. She is also now experiencing vaginal itching/irritation from the antibiotics and needs something to treat the yeast infection. Treatment to date: antibiotics, antihistamines and nasal steroids. She does smoke.    ROS: Per HPI  Current Outpatient Medications:  .  amoxicillin-clavulanate (AUGMENTIN) 875-125 MG tablet, Take 1 tablet by mouth 2 (two) times daily., Disp: 14 tablet, Rfl: 0 .  azelastine (ASTELIN) 0.1 % nasal spray, Place 1 spray into both nostrils 2 (two) times daily. Use in each nostril as directed, Disp: 30 mL, Rfl: 12 .  levothyroxine (SYNTHROID) 150 MCG tablet, Take 1 tablet (150 mcg total) by  mouth daily before breakfast., Disp: 90 tablet, Rfl: 1 .  loratadine (CLARITIN) 10 MG tablet, Take 1 tablet (10 mg total) by mouth daily., Disp: 30 tablet, Rfl: 11 .  tamoxifen (NOLVADEX) 20 MG tablet, Take 1 tablet (20 mg total) by mouth daily., Disp: 90 tablet, Rfl: 3 .  triamcinolone cream (KENALOG) 0.1 %, Apply 1 application topically 2 (two) times daily., Disp: 30 g, Rfl: 0  Allergies  Allergen Reactions  . Ivp Dye [Iodinated Diagnostic Agents] Other (See Comments)    Chest pain  . Other Shortness Of Breath, Swelling and Other (See Comments)    Versed or Fentanyl when used for endoscopy caused throat and tongue swelling  . Dilaudid [Hydromorphone Hcl]     PT had CP and felt shaky. Didn't like the feeling, but sx lessened w/in 10 min of IV administration  . Asa [Aspirin] Other (See Comments)    spasms  . Codeine Other (See Comments)    spasms   Past Medical History:  Diagnosis Date  . Anxiety   . Breast cancer ([place])   . Cancer [facility])    breast cancer  . Depression   . Edema    bilateral feet and leg swelling  . GERD (gastroesophageal reflux disease)   . History of hiatal hernia   . History of radiation therapy 02/12/17- 03/26/17   Chest wall and regional nodes, right- 50 Gy in 25 fractions, Chest Wall, boost, right- 10 Gy in 5 fractions.   . Hypothyroidism   . Malaise   . Personal history of chemotherapy   . Personal history of  radiation therapy   . Thyroid disease   . Varicose veins     Observations/Objective: A&O  No respiratory distress or wheezing audible over the phone Mood, judgement, and thought processes all WNL  Assessment and Plan: 1. Acute non-recurrent sinusitis, unspecified location - doxycycline (VIBRA-TABS) 100 MG tablet; Take 1 tablet (100 mg total) by mouth 2 (two) times daily for 7 days. 1 po bid  Dispense: 14 tablet; Refill: 0  2. Antibiotic-induced yeast infection - fluconazole (DIFLUCAN) 150 MG tablet; Take 1 tablet (150 mg total) by mouth once  for 1 dose. May repeat after 3 days if needed.  Dispense: 2 tablet; Refill: 0   Follow Up Instructions:  I discussed the assessment and treatment plan with the patient. The patient was provided an opportunity to ask questions and all were answered. The patient agreed with the plan and demonstrated an understanding of the instructions.   The patient was advised to call back or seek an in-person evaluation if the symptoms worsen or if the condition fails to improve as anticipated.  The above assessment and management plan was discussed with the patient. The patient verbalized understanding of and has agreed to the management plan. Patient is aware to call the clinic if symptoms persist or worsen. Patient is aware when to return to the clinic for a follow-up visit. Patient educated on when it is appropriate to go to the emergency department.   Time call ended: 9:10 AM  I provided 17 minutes of non-face-to-face time during this encounter.  Hendricks Limes, MSN, APRN, FNP-C [facility] 06/11/19

## 2019-06-11 NOTE — Telephone Encounter (Signed)
The antibiotic will help her ear.

## 2019-06-16 ENCOUNTER — Other Ambulatory Visit: Payer: Self-pay | Admitting: General Surgery

## 2019-06-16 DIAGNOSIS — Z1231 Encounter for screening mammogram for malignant neoplasm of breast: Secondary | ICD-10-CM

## 2019-06-18 ENCOUNTER — Encounter: Payer: Self-pay | Admitting: Family Medicine

## 2019-06-18 ENCOUNTER — Ambulatory Visit (INDEPENDENT_AMBULATORY_CARE_PROVIDER_SITE_OTHER): Payer: Medicare HMO | Admitting: Family Medicine

## 2019-06-18 DIAGNOSIS — J019 Acute sinusitis, unspecified: Secondary | ICD-10-CM | POA: Diagnosis not present

## 2019-06-18 MED ORDER — PREDNISONE 20 MG PO TABS: ORAL_TABLET | ORAL | 0 refills | Status: DC

## 2019-06-18 NOTE — Progress Notes (Signed)
   Virtual Visit via telephone Note  I connected with Cynthia Chavez on 06/18/19 at 1305 by telephone and verified that I am speaking with the correct person using two identifiers. Cynthia Chavez is currently located at home and no other people are currently with her during visit. The provider, Fransisca Kaufmann Danalee Flath, MD is located in their office at time of visit.  Call ended at 1315  I discussed the limitations, risks, security and privacy concerns of performing an evaluation and management service by telephone and the availability of in person appointments. I also discussed with the patient that there may be a patient responsible charge related to this service. The patient expressed understanding and agreed to proceed.   History and Present Illness: Patient has right ear pain and pressure and just finished antibiotic yesterday.  Patient is having pain in both ears but worse in the right and she is worse in the morning and has dizziness and nausea. She has tried 2 antibiotics and she has not improved. She denies any fevers or chills or shortness of breath. She denies any sick contacts.  No diagnosis found.    Review of Systems  Constitutional: Negative for chills and fever.  HENT: Positive for congestion, ear pain, sore throat and voice change. Negative for sneezing and trouble swallowing.   Eyes: Negative for visual disturbance.  Respiratory: Positive for cough. Negative for chest tightness and shortness of breath.   Cardiovascular: Negative for chest pain and leg swelling.  Musculoskeletal: Negative for back pain and gait problem.  Skin: Negative for rash.  Neurological: Negative for light-headedness and headaches.  Psychiatric/Behavioral: Negative for agitation and behavioral problems.  All other systems reviewed and are negative.   Observations/Objective: Patient sounds comfortable and in no acute distress  Assessment and Plan: Problem List Items Addressed This Visit    None     Visit Diagnoses    Acute non-recurrent sinusitis, unspecified location    -  Primary   Relevant Medications   predniSONE (DELTASONE) 20 MG tablet       Follow Up Instructions:  Follow-up as needed but since she already finished the antibiotic then we will try steroids and see if is more allergic related or inflammation   I discussed the assessment and treatment plan with the patient. The patient was provided an opportunity to ask questions and all were answered. The patient agreed with the plan and demonstrated an understanding of the instructions.   The patient was advised to call back or seek an in-person evaluation if the symptoms worsen or if the condition fails to improve as anticipated.  The above assessment and management plan was discussed with the patient. The patient verbalized understanding of and has agreed to the management plan. Patient is aware to call the clinic if symptoms persist or worsen. Patient is aware when to return to the clinic for a follow-up visit. Patient educated on when it is appropriate to go to the emergency department.    I provided 10 minutes of non-face-to-face time during this encounter.    Worthy Rancher, MD

## 2019-06-19 NOTE — Telephone Encounter (Signed)
Patient had a phone visit 11/11- this encounter will be closed.

## 2019-07-21 ENCOUNTER — Encounter: Payer: Medicare HMO | Admitting: Family Medicine

## 2019-08-07 ENCOUNTER — Other Ambulatory Visit: Payer: Self-pay

## 2019-08-07 ENCOUNTER — Ambulatory Visit
Admission: RE | Admit: 2019-08-07 | Discharge: 2019-08-07 | Disposition: A | Discharge status: Home / Self Care | Payer: Medicare HMO | Source: Ambulatory Visit | Attending: General Surgery | Admitting: General Surgery

## 2019-08-07 DIAGNOSIS — Z1231 Encounter for screening mammogram for malignant neoplasm of breast: Secondary | ICD-10-CM

## 2019-08-11 ENCOUNTER — Encounter: Payer: Self-pay | Admitting: Family Medicine

## 2019-08-11 ENCOUNTER — Ambulatory Visit (INDEPENDENT_AMBULATORY_CARE_PROVIDER_SITE_OTHER): Payer: Medicare HMO | Admitting: Family Medicine

## 2019-08-11 ENCOUNTER — Other Ambulatory Visit: Payer: Self-pay

## 2019-08-11 VITALS — BP 119/72 | HR 88 | Temp 98.0°F | Ht 67.0 in | Wt 187.0 lb

## 2019-08-11 DIAGNOSIS — E039 Hypothyroidism, unspecified: Secondary | ICD-10-CM | POA: Diagnosis not present

## 2019-08-11 DIAGNOSIS — K432 Incisional hernia without obstruction or gangrene: Secondary | ICD-10-CM | POA: Diagnosis not present

## 2019-08-11 DIAGNOSIS — R21 Rash and other nonspecific skin eruption: Secondary | ICD-10-CM | POA: Diagnosis not present

## 2019-08-11 DIAGNOSIS — Z0000 Encounter for general adult medical examination without abnormal findings: Secondary | ICD-10-CM

## 2019-08-11 DIAGNOSIS — Z853 Personal history of malignant neoplasm of breast: Secondary | ICD-10-CM

## 2019-08-11 DIAGNOSIS — Z91018 Allergy to other foods: Secondary | ICD-10-CM | POA: Diagnosis not present

## 2019-08-11 DIAGNOSIS — F17200 Nicotine dependence, unspecified, uncomplicated: Secondary | ICD-10-CM

## 2019-08-11 DIAGNOSIS — R911 Solitary pulmonary nodule: Secondary | ICD-10-CM

## 2019-08-11 DIAGNOSIS — Z1211 Encounter for screening for malignant neoplasm of colon: Secondary | ICD-10-CM

## 2019-08-11 DIAGNOSIS — Z9189 Other specified personal risk factors, not elsewhere classified: Secondary | ICD-10-CM

## 2019-08-11 NOTE — Patient Instructions (Addendum)
You had labs performed today.  You will be contacted with the results of the labs once they are available, usually in the next 3 business days for routine lab work.  If you have an active my chart account, they will be released to your MyChart.  If you prefer to have these labs released to you via telephone, please let us know.  If you had a pap smear or biopsy performed, expect to be contacted in about 7-10 days.   Thank you for coming in today for your Annual Medicare Wellness Visit.  Things that we discussed today are included in this packet.   Create and/or bring a copy of your Living Will/ Advanced Directive into the office so that we may respect your wishes should an emergency occur.   Get the recommended life-saving vaccines we discussed today.   Get your mammogram/ colonoscopy/ DEXA scans as directed by your provider.   Make sure that your medications are organized and safely stored.  Remember to always ask for help if you forget when/ how to take your medications.   Make healthy food choices (Rich in fruits/ veggies/ lean meats and low in salt, sugar and fat)   Do something that you enjoy for at least 30 minutes every day to stay active (walking, gardening, swimming, etc). This will help you lower your risk of falls/ broken bones.   Be social, do puzzles/ crosswords.  These things help the mind stay young and lower your risk of developing dementia.   Make sure that your home is safe by checking your smoke detectors regularly and doing the things outlined below to lower your risk of falls.  Fall Prevention in the Home Falls can cause injuries and can affect people from all age groups. There are many simple things that you can do to make your home safe and to help prevent falls. What can I do on the outside of my home?  Regularly repair the edges of walkways and driveways and fix any cracks.  Remove high doorway thresholds.  Trim any shrubbery on the main path into your  home.  Use bright outdoor lighting.  Clear walkways of debris and clutter, including tools and rocks.  Regularly check that handrails are securely fastened and in good repair. Both sides of any steps should have handrails.  Install guardrails along the edges of any raised decks or porches.  Have leaves, snow, and ice cleared regularly.  Use sand or salt on walkways during winter months.  In the garage, clean up any spills right away, including grease or oil spills. What can I do in the bathroom?  Use night lights.  Install grab bars by the toilet and in the tub and shower. Do not use towel bars as grab bars.  Use non-skid mats or decals on the floor of the tub or shower.  If you need to sit down while you are in the shower, use a plastic, non-slip stool.  Keep the floor dry. Immediately clean up any water that spills on the floor.  Remove soap buildup in the tub or shower on a regular basis.  Attach bath mats securely with double-sided non-slip rug tape.  Remove throw rugs and other tripping hazards from the floor. What can I do in the bedroom?  Use night lights.  Make sure that a bedside light is easy to reach.  Do not use oversized bedding that drapes onto the floor.  Have a firm chair that has side arms to use   for getting dressed.  Remove throw rugs and other tripping hazards from the floor. What can I do in the kitchen?  Clean up any spills right away.  Avoid walking on wet floors.  Place frequently used items in easy-to-reach places.  If you need to reach for something above you, use a sturdy step stool that has a grab bar.  Keep electrical cables out of the way.  Do not use floor polish or wax that makes floors slippery. If you have to use wax, make sure that it is non-skid floor wax.  Remove throw rugs and other tripping hazards from the floor. What can I do in the stairways?  Do not leave any items on the stairs.  Make sure that there are  handrails on both sides of the stairs. Fix handrails that are broken or loose. Make sure that handrails are as long as the stairways.  Check any carpeting to make sure that it is firmly attached to the stairs. Fix any carpet that is loose or worn.  Avoid having throw rugs at the top or bottom of stairways, or secure the rugs with carpet tape to prevent them from moving.  Make sure that you have a light switch at the top of the stairs and the bottom of the stairs. If you do not have them, have them installed. What are some other fall prevention tips?  Wear closed-toe shoes that fit well and support your feet. Wear shoes that have rubber soles or low heels.  When you use a stepladder, make sure that it is completely opened and that the sides are firmly locked. Have someone hold the ladder while you are using it. Do not climb a closed stepladder.  Add color or contrast paint or tape to grab bars and handrails in your home. Place contrasting color strips on the first and last steps.  Use mobility aids as needed, such as canes, walkers, scooters, and crutches.  Turn on lights if it is dark. Replace any light bulbs that burn out.  Set up furniture so that there are clear paths. Keep the furniture in the same spot.  Fix any uneven floor surfaces.  Choose a carpet design that does not hide the edge of steps of a stairway.  Be aware of any and all pets.  Review your medicines with your healthcare provider. Some medicines can cause dizziness or changes in blood pressure, which increase your risk of falling. Talk with your health care provider about other ways that you can decrease your risk of falls. This may include working with a physical therapist or trainer to improve your strength, balance, and endurance. This information is not intended to replace advice given to you by your health care provider. Make sure you discuss any questions you have with your health care provider. Document Released:  07/14/2002 Document Revised: 12/21/2015 Document Reviewed: 08/28/2014 Elsevier Interactive Patient Education  2017 [employer]   

## 2019-08-11 NOTE — Progress Notes (Signed)
Subjective:   Cynthia Chavez is a 64 y.o. female who presents for an Initial Medicare Annual Wellness Visit.  Patient reports that she has had intermittent right upper quadrant pain and with a palpable mass that is reducible.  This was evaluated under ultrasound by her previous PCP last year.  Gallbladder noted to be surgically absent and no other abnormalities really appreciated except for mild increased echogenicity of the liver.  She denies any change in bowel habits.  No nausea or vomiting.  No hematochezia or melena.  No unplanned weight loss.  She reports a rash that was present immediately following consumption of deer meat.  She had me intolerance when she was undergoing chemotherapy for right-sided breast cancer previously.  However at that time, the intolerance of meat was thought to be related to chemotherapy.  She does admit to having been bitten by ticks in the past.  She is never been tested for red meat allergy but would like to have this done today.  Review of Systems    Negative except for as above  Cardiac Risk Factors include: advanced age (>2mn, >>57women)     Objective:    Today's Vitals   08/11/19 1101  BP: 119/72  Pulse: 88  Temp: 98 F (36.7 C)  TempSrc: Temporal  SpO2: 96%  Weight: 187 lb (84.8 kg)  Height: '5\' 7"'  (1.702 m)   Body mass index is 29.29 kg/m.  Advanced Directives 08/11/2019 09/19/2017 05/11/2017 04/06/2017 02/23/2017 01/19/2017 01/17/2017  Does Patient Have a Medical Advance Directive? No No No No No No No  Would patient like information on creating a medical advance directive? Yes (MAU/Ambulatory/Procedural Areas - Information given) No - Patient declined No - Patient declined - - No - Patient declined No - Patient declined    Current Medications (verified) Outpatient Encounter Medications as of 08/11/2019  Medication Sig  . levothyroxine (SYNTHROID) 150 MCG tablet Take 1 tablet (150 mcg total) by mouth daily before breakfast.  . tamoxifen  (NOLVADEX) 20 MG tablet Take 1 tablet (20 mg total) by mouth daily.  .Marland Kitchentriamcinolone cream (KENALOG) 0.1 % Apply 1 application topically 2 (two) times daily. (Patient not taking: Reported on 08/11/2019)  . [DISCONTINUED] azelastine (ASTELIN) 0.1 % nasal spray Place 1 spray into both nostrils 2 (two) times daily. Use in each nostril as directed  . [DISCONTINUED] loratadine (CLARITIN) 10 MG tablet Take 1 tablet (10 mg total) by mouth daily.  . [DISCONTINUED] predniSONE (DELTASONE) 20 MG tablet 2 po at same time daily for 5 days   No facility-administered encounter medications on file as of 08/11/2019.    Allergies (verified) Ivp dye [iodinated diagnostic agents], Other, Dilaudid [hydromorphone hcl], Asa [aspirin], and Codeine   History: Past Medical History:  Diagnosis Date  . Anxiety   . [place]   . Cancer ([facility]    breast cancer  . Depression   . Edema    bilateral feet and leg swelling  . GERD (gastroesophageal reflux disease)   . History of hiatal hernia   . History of radiation therapy 02/12/17- 03/26/17   Chest wall and regional nodes, right- 50 Gy in 25 fractions, Chest Wall, boost, right- 10 Gy in 5 fractions.   . Hypothyroidism   . Malaise   . Personal history of chemotherapy   . Personal history of radiation therapy   . Thyroid disease   . Varicose veins    Past Surgical History:  Procedure Laterality Date  . ABDOMINAL HYSTERECTOMY    .  AXILLARY LYMPH NODE BIOPSY Right 01/03/2017   Procedure: RIGHT AXILLARY LYMPH NODE SEED GUIDED EXCISIONAL BIOPSY;  Surgeon: Rolm Bookbinder, MD;  Location: [facility];  Service: General;  Laterality: Right;  . CHOLECYSTECTOMY    . COLONOSCOPY    . ESOPHAGEAL DILATION    . MASTECTOMY Right   . MASTECTOMY W/ SENTINEL NODE BIOPSY Right 01/03/2017   axillary  . MASTECTOMY W/ SENTINEL NODE BIOPSY Right 01/03/2017   Procedure: RIGHT TOTAL MASTECTOMY WITH RIGHT AXILLARY SENTINEL LYMPH NODE BIOPSY;  Surgeon: Rolm Bookbinder, MD;   Location: [facility];  Service: General;  Laterality: Right;  . PORTACATH PLACEMENT Left 08/17/2016   Procedure: INSERTION PORT-A-CATH WITH [place];  Surgeon: Rolm Bookbinder, MD;  Location: [facility];  Service: General;  Laterality: Left;   Family History  Problem Relation Age of Onset  . COPD Father   . Cancer Father    Social History   Socioeconomic History  . Marital status: Married    Spouse name: Not on file  . Number of children: 3  . Years of education: 4  . Highest education level: 11th grade  Occupational History  . Not on file  Tobacco Use  . Smoking status: Current Every Day Smoker    Packs/day: 0.50    Years: 25.00    Pack years: 12.50    Types: Cigarettes    Start date: 04/19/1993  . Smokeless tobacco: Never Used  . Tobacco comment: she stopped smoking for a long time, but started back.   Substance and Sexual Activity  . Alcohol use: No  . Drug use: No  . Sexual activity: Not on file  Other Topics Concern  . Not on file  Social History Narrative  . Not on file   Social Determinants of Health   Financial Resource Strain:   . Difficulty of Paying Living Expenses: Not on file  Food Insecurity:   . Worried About Charity fundraiser in the Last Year: Not on file  . Ran Out of Food in the Last Year: Not on file  Transportation Needs:   . Lack of Transportation (Medical): Not on file  . Lack of Transportation (Non-Medical): Not on file  Physical Activity:   . Days of Exercise per Week: Not on file  . Minutes of Exercise per Session: Not on file  Stress:   . Feeling of Stress : Not on file  Social Connections:   . Frequency of Communication with Friends and Family: Not on file  . Frequency of Social Gatherings with Friends and Family: Not on file  . Attends Religious Services: Not on file  . Active Member of Clubs or Organizations: Not on file  . Attends Archivist Meetings: Not on file  . Marital Status: Not on file    Tobacco  Counseling Ready to quit: Not Answered Counseling given: Not Answered Comment: she stopped smoking for a long time, but started back.    Clinical Intake:     Pain : No/denies pain     BMI - recorded: 29 Nutritional Status: BMI 25 -29 Overweight Nutritional Risks: None Diabetes: No  How often do you need to have someone help you when you read instructions, pamphlets, or other written materials from your doctor or pharmacy?: 1 - Never What is the last grade level you completed in school?: 11  Interpreter Needed?: No  Information entered by :: Faylene Million, LPN   Activities of Daily Living In your present state of health, do you  have any difficulty performing the following activities: 08/11/2019  Hearing? N  Vision? N  Difficulty concentrating or making decisions? N  Walking or climbing stairs? N  Dressing or bathing? N  Doing errands, shopping? N  Preparing Food and eating ? N  Using the Toilet? N  In the past six months, have you accidently leaked urine? N  Do you have problems with loss of bowel control? N  Managing your Medications? N  Managing your Finances? N  Housekeeping or managing your Housekeeping? N  Some recent data might be hidden     Immunizations and Health Maintenance  There is no immunization history on file for this patient. Health Maintenance Due  Topic Date Due  . COLONOSCOPY  08/22/2003  . DEXA SCAN  08/21/2018  . PNA vac Low Risk Adult (1 of 2 - PCV13) 08/21/2018    Patient Care Team: Janora Norlander, DO as PCP - General (Family Medicine)  Indicate any recent Medical Services you may have received from other than [facility] providers in the past year (date may be approximate).  Physical exam: Blood pressure 119/72, pulse 88, temperature 98 F (36.7 C), temperature source Temporal, height '5\' 7"'  (1.702 m), weight 187 lb (84.8 kg), SpO2 96 %. General: Well-appearing female.  No apparent distress HEENT: Sclera white.  Moist mucous  membranes Abdomen: A well-healed postsurgical scar noted in the right upper quadrant.  She has a palpable reducible hernia that is approximately golf ball sized in this area.  No tenderness to palpation.  No discoloration of the skin. MSK: Ambulating independently.  Normal tone.     Assessment:   This is a routine wellness examination for [place].  Hearing/Vision screen No exam data present  Dietary issues and exercise activities discussed: Current Exercise Habits: The patient does not participate in regular exercise at present, Exercise limited by: None identified  Goals    . DIET - DECREASE SODA OR JUICE INTAKE      Depression Screen PHQ 2/9 Scores 08/11/2019 11/11/2018 03/22/2018 11/15/2017 05/11/2017 01/17/2017 11/01/2016  PHQ - 2 Score 0 0 0 0 0 0 -  PHQ- 9 Score - 0 - - - - -  Exception Documentation - - - - - - Patient refusal    Fall Risk Fall Risk  08/11/2019 11/11/2018 03/22/2018 11/15/2017 05/11/2017  Falls in the past year? 0 0 No No No   Is the patient's home free of loose throw rugs in walkways, pet beds, electrical cords, etc?   no      Grab bars in the bathroom? no      Handrails on the stairs?   no      Adequate lighting?   yes  Cognitive Function:     6CIT Screen 08/11/2019 08/11/2019  What Year? 0 points 0 points  What month? 0 points 0 points  What time? 0 points 0 points  Count back from 20 0 points 0 points  Months in reverse 0 points 0 points  Repeat phrase 6 points 6 points  Total Score 6 6    Screening Tests Health Maintenance  Topic Date Due  . COLONOSCOPY  08/22/2003  . DEXA SCAN  08/21/2018  . PNA vac Low Risk Adult (1 of 2 - PCV13) 08/21/2018  . HIV Screening  09/19/2019 (Originally 08/21/1970)  . MAMMOGRAM  08/06/2021  . Hepatitis C Screening  Completed  . INFLUENZA VACCINE  Discontinued  . PAP SMEAR-Modifier  Discontinued  . TETANUS/TDAP  Discontinued    Qualifies for  Shingles Vaccine? Yes but declines.   There is no immunization history on file  for this patient.   Cancer Screenings: Lung: Low Dose CT Chest recommended if Age 72-80 years, 30 pack-year currently smoking OR have quit w/in 15years. Patient does not qualify. Breast: Up to date on Mammogram? Yes   Up to date of Bone Density/Dexa? No;  Colorectal: will schedule.  Additional Screenings: Hepatitis C Screening: done 04/2017 negative.     Plan:      1. Welcome to Medicare preventive visit Patient declines all vaccinations.  Wants to defer DEXA screen.  Counseling provided.  She will contemplate if she would like to have any of these vaccines done.  2. Screen for colon cancer Referral placed [facility] per her request. - Ambulatory referral to Gastroenterology  3. Allergy to meat - red meat allergy  4. Rash - red meat allergy  5. Acquired hypothyroidism Not discussed at length today but will collect laboratory work-up along with red meat panel as above - Thyroid Panel With TSH - CMP14+EGFR  6. Pulmonary nodule less than 1 cm in diameter with moderate to high risk for malignant neoplasm CT chest from 2018 was reviewed.  A 4 mm right-sided pulmonary nodule was found.  No follow-up CT scans in her EMR.  That this was recommended.  She is an active every day smoker and therefore is at high risk for pulmonary malignancies, specifically in the setting of previous breast neoplasm. - CT Chest Wo Contrast; Future  7. Tobacco use disorder - CT Chest Wo Contrast; Future  8. History of breast cancer - CT Chest Wo Contrast; Future  9. Incisional hernia, without obstruction or gangrene At this time apparently uncomplicated incisional hernia noted in the right upper quadrant.  I have referred her back to her original surgeon, Dr. Lucia Gaskins in [place]. - Ambulatory referral to General Surgery  I have personally reviewed and noted the following in the patient's chart:   . Medical and social history . Use of alcohol, tobacco or illicit drugs  . Current  medications and supplements . Functional ability and status . Nutritional status . Physical activity . Advanced directives . List of other physicians . Hospitalizations, surgeries, and ER visits in previous 12 months . Vitals . Screenings to include cognitive, depression, and falls . Referrals and appointments  In addition, I have reviewed and discussed with patient certain preventive protocols, quality metrics, and best practice recommendations. A written personalized care plan for preventive services as well as general preventive health recommendations were provided to patient.     Ronnie Doss, DO   08/11/2019

## 2019-08-14 LAB — CMP14+EGFR
ALT: 12 IU/L (ref 0–32)
AST: 13 IU/L (ref 0–40)
Albumin/Globulin Ratio: 1.6 (ref 1.2–2.2)
Albumin: 4 g/dL (ref 3.8–4.8)
Alkaline Phosphatase: 94 IU/L (ref 39–117)
BUN/Creatinine Ratio: 18 (ref 12–28)
BUN: 16 mg/dL (ref 8–27)
Bilirubin Total: 0.3 mg/dL (ref 0.0–1.2)
CO2: 23 mmol/L (ref 20–29)
Calcium: 8.9 mg/dL (ref 8.7–10.3)
Chloride: 104 mmol/L (ref 96–106)
Creatinine, Ser: 0.88 mg/dL (ref 0.57–1.00)
GFR calc Af Amer: 80 mL/min/{1.73_m2} (ref >59)
GFR calc non Af Amer: 69 mL/min/{1.73_m2} (ref >59)
Globulin, Total: 2.5 g/dL (ref 1.5–4.5)
Glucose: 93 mg/dL (ref 65–99)
Potassium: 3.9 mmol/L (ref 3.5–5.2)
Sodium: 140 mmol/L (ref 134–144)
Total Protein: 6.5 g/dL (ref 6.0–8.5)

## 2019-08-14 LAB — ALPHA-GAL PANEL
Alpha Gal IgE*: 37.3 kU/L — ABNORMAL HIGH (ref <0.10)
Beef (Bos spp) IgE: 22.4 kU/L — ABNORMAL HIGH (ref <0.35)
Class Interpretation: 2
Class Interpretation: 3
Class Interpretation: 4
Lamb/Mutton (Ovis spp) IgE: 3.25 kU/L — ABNORMAL HIGH (ref <0.35)
Pork (Sus spp) IgE: 8.67 kU/L — ABNORMAL HIGH (ref <0.35)

## 2019-08-14 LAB — THYROID PANEL WITH TSH
Free Thyroxine Index: 1.9 (ref 1.2–4.9)
T3 Uptake Ratio: 22 % — ABNORMAL LOW (ref 24–39)
T4, Total: 8.8 ug/dL (ref 4.5–12.0)
TSH: 5.8 u[IU]/mL — ABNORMAL HIGH (ref 0.450–4.500)

## 2019-08-15 ENCOUNTER — Other Ambulatory Visit: Payer: Self-pay | Admitting: Family Medicine

## 2019-08-15 DIAGNOSIS — T781XXA Other adverse food reactions, not elsewhere classified, initial encounter: Secondary | ICD-10-CM

## 2019-08-15 MED ORDER — EPINEPHRINE 0.3 MG/0.3ML IJ SOAJ: 0.3000 mg | INTRAMUSCULAR | 0 refills | Status: DC | PRN

## 2019-08-27 ENCOUNTER — Telehealth: Payer: Self-pay | Admitting: Family Medicine

## 2019-08-27 NOTE — Telephone Encounter (Signed)
The CT chest that I ordered was without contrast.  I am not sure she is talking about another CAT scan but the order I placed was without dye.

## 2019-08-27 NOTE — Telephone Encounter (Signed)
Spoke with the pt and advised the CT order specifically states without contrast and pt voiced understanding.

## 2019-08-29 ENCOUNTER — Ambulatory Visit (HOSPITAL_COMMUNITY)
Admission: RE | Admit: 2019-08-29 | Discharge: 2019-08-29 | Disposition: A | Discharge status: Home / Self Care | Payer: Medicare HMO | Source: Ambulatory Visit | Attending: Family Medicine | Admitting: Family Medicine

## 2019-08-29 ENCOUNTER — Other Ambulatory Visit: Payer: Self-pay

## 2019-08-29 DIAGNOSIS — R911 Solitary pulmonary nodule: Secondary | ICD-10-CM | POA: Insufficient documentation

## 2019-08-29 DIAGNOSIS — Z9189 Other specified personal risk factors, not elsewhere classified: Secondary | ICD-10-CM | POA: Diagnosis not present

## 2019-08-29 DIAGNOSIS — Z853 Personal history of malignant neoplasm of breast: Secondary | ICD-10-CM

## 2019-08-29 DIAGNOSIS — M47814 Spondylosis without myelopathy or radiculopathy, thoracic region: Secondary | ICD-10-CM | POA: Diagnosis not present

## 2019-08-29 DIAGNOSIS — F17200 Nicotine dependence, unspecified, uncomplicated: Secondary | ICD-10-CM | POA: Insufficient documentation

## 2019-08-29 DIAGNOSIS — I700 Atherosclerosis of aorta: Secondary | ICD-10-CM | POA: Diagnosis not present

## 2019-08-29 DIAGNOSIS — J9811 Atelectasis: Secondary | ICD-10-CM | POA: Diagnosis not present

## 2019-08-29 DIAGNOSIS — K449 Diaphragmatic hernia without obstruction or gangrene: Secondary | ICD-10-CM | POA: Diagnosis not present

## 2019-09-12 ENCOUNTER — Encounter: Payer: Self-pay | Admitting: Family Medicine

## 2019-10-05 NOTE — Progress Notes (Signed)
Patient Care Team: Janora Norlander, DO as PCP - General (Family Medicine)  DIAGNOSIS:    ICD-10-CM   1. Malignant neoplasm of upper-outer quadrant of right breast in female, estrogen receptor positive ([place])  C50.411    Z17.0     SUMMARY OF ONCOLOGIC HISTORY: Oncology History  Breast cancer of upper-outer quadrant of right female breast ([place])  07/24/2016 Mammogram   Rt Breast 2 masses at 11-12:30 Position spanning 8 cm; Additional mass at 2 o clock: 1.5 cm; LN with cort thickening 2.7 cm   07/27/2016 Initial Diagnosis   Rt Biopsy 11oclock and 2 o clock: Grade 3 IDC with LVI; LN biopsy: Positive IDC; 11oclock: Er 100%, PR 90%, Ki 67: 90%; Her 2 Neg Heterogeneous; LN: Er 100%, PR: 90%; Ki67: 60%; Her 2 Pos Ratio 2.37; 2oclock: Er 100%, PR 90%; Ki 67: 90%; Her 2 Positive 2.52   08/16/2016 - 12/01/2016 Neo-Adjuvant Chemotherapy   Neoadjuvant chemotherapy with [place] Perjeta (Taxotere d/ced after 2 cycles)   12/06/2016 Breast MRI   Residual right breast 2 o'clock enhancing mass which now measures 1.3 cm in greatest dimension.Residual non measurable scattered foci of heterogeneous enhancement throughout the superior and subareolar right breast, anterior and middle depth. Diminished right axillary lymphadenopathy   01/03/2017 Surgery   Right mastectomy: Multiple IDC with micropapillary features, LVI, grade 3, 1.5, 0.6, 0.5 cm, 12/20 lymph nodes positive for cancer, margins negative, ER 100%, PR 90%, HER-2 positive, Ki-67 60%,ypT1c ypN3 RCB class III; stage IIIB   02/12/2017 - 03/26/2017 Radiation Therapy   Adjuvant radiation therapy   04/06/2017 -  Anti-estrogen oral therapy   Anastrozole 1 mg by mouth daily, could not tolerate Neratinib; stopped 10/03/18 due to severe myalgias, switched to tamoxifen started 10/17/18     CHIEF COMPLIANT: Follow-up of right breast cancer on anastrozole therapy  INTERVAL HISTORY: Cynthia Chavez is a 64 y.o. with above-mentioned history of right breast cancer  treated with neoadjuvant chemotherapy, mastectomy, radiation, and who is currently on antiestrogen therapy with anastrozole. Mammogram on 08/07/19 showed no evidence of malignancy in the left breast. She presents to the clinic today for annual follow-up.    ALLERGIES:  is allergic to ivp dye [iodinated diagnostic agents]; other; dilaudid [hydromorphone hcl]; asa [aspirin]; and codeine.  MEDICATIONS:  Current Outpatient Medications  Medication Sig Dispense Refill  . EPINEPHrine (EPIPEN 2-PAK) 0.3 mg/0.3 mL IJ SOAJ injection Inject 0.3 mLs (0.3 mg total) into the muscle as needed for anaphylaxis. Then call 911 or go to ER 1 each 0  . levothyroxine (SYNTHROID) 150 MCG tablet Take 1 tablet (150 mcg total) by mouth daily before breakfast. 90 tablet 1  . tamoxifen (NOLVADEX) 20 MG tablet Take 1 tablet (20 mg total) by mouth daily. 90 tablet 3  . triamcinolone cream (KENALOG) 0.1 % Apply 1 application topically 2 (two) times daily. (Patient not taking: Reported on 08/11/2019) 30 g 0   No current facility-administered medications for this visit.    PHYSICAL EXAMINATION: ECOG PERFORMANCE STATUS: 1 - Symptomatic but completely ambulatory  Vitals:   10/06/19 1355  BP: 124/79  Pulse: 90  Resp: 16  Temp: 98.3 F (36.8 C)  SpO2: 100%   Filed Weights   10/06/19 1355  Weight: 191 lb 11.2 oz (87 kg)    BREAST: No palpable masses or nodules in either right or left breasts. No palpable axillary supraclavicular or infraclavicular adenopathy no breast tenderness or nipple discharge. (exam performed in the presence of a chaperone)  LABORATORY DATA:  I have reviewed the data as listed CMP Latest Ref Rng & Units 08/11/2019 03/22/2018 10/04/2017  Glucose 65 - 99 mg/dL 93 86 103  BUN 8 - 27 mg/dL '16 10 15  ' Creatinine 0.57 - 1.00 mg/dL 0.88 0.58 0.80  Sodium 134 - 144 mmol/L 140 142 143  Potassium 3.5 - 5.2 mmol/L 3.9 4.1 3.9  Chloride 96 - 106 mmol/L 104 104 108  CO2 20 - 29 mmol/L '23 25 26  ' Calcium 8.7  - 10.3 mg/dL 8.9 9.3 9.1  Total Protein 6.0 - 8.5 g/dL 6.5 6.2 6.4  Total Bilirubin 0.0 - 1.2 mg/dL 0.3 0.4 0.3  Alkaline Phos 39 - 117 IU/L 94 101 94  AST 0 - 40 IU/L '13 12 10  ' ALT 0 - 32 IU/L '12 13 13    ' Lab Results  Component Value Date   WBC 6.1 03/22/2018   HGB 13.0 03/22/2018   HCT 39.1 03/22/2018   MCV 95 03/22/2018   PLT 197 03/22/2018   NEUTROABS 4.3 03/22/2018    ASSESSMENT & PLAN:  Breast cancer of upper-outer quadrant of right female breast ([place]) Breast cancer of upper-outer quadrant of right female breast ([place]) Rt Biopsy 11oclock and 2 o clock: Grade 3 IDC with LVI; LN biopsy: Positive IDC; 11oclock: Er 100%, PR 90%, Ki 67: 90%; Her 2 Neg Heterogeneous; LN: Er 100%, PR: 90%; Ki67: 60%; Her 2 Pos Ratio 2.37; 2oclock: Er 100%, PR 90%; Ki 67: 90%; Her 2 Positive 2.52 Breast MRI on 08/13/2016: 8 cm span of breast cancer with multiple enlarged axillary lymph nodes. CT CAP: 08/17/16: 4 mm right middle lobe pulmonary nodule unlikely to be metastatic disease Bone scan 08/17/2016: No evidence of metastatic disease  Echocardiogram performed on 05/17/2017 showed a left ventricular ejection fraction of 60-65%.  Treatment summary: 1. Neoadjuvant chemotherapy with [place] Perjeta 6 cyclesgiven1/10/18 to 12/01/16 followed by Herceptin Perjetamaintenance for 1 year  2. Followed by mastectomy with axillary node dissection 01/03/2017 3. Adjuvant radiation therapycompleted 03/26/2017 4. Followed by Anti-estrogen therapy started 04/06/2017 5. Followed by Neratinib for 1 yearstarted 09/07/2017 -------------------------------------------------------------------------------------------------------------------------------------------------------------------------------------------------------------------------------------- 01/04/2007 Right mastectomy: Multiple IDC with micropapillary features, LVI, grade 3, 1.5, 0.6, 0.5 cm, 12/20 lymph nodes positive for cancer, margins negative, ER 100%, PR  90%, HER-2 positive, Ki-67 60%,ypT1c ypN3 RCB class III; stage IIIB  Adjuvant radiation therapy started 07/09/2018completed 03/26/2017  Current treatment:Neratinib (started 09/01/17 discontinued 10/04/2017) andAnastrozole started 04/06/17 switched to tamoxifen 10/03/2018  Tamoxifen toxicities: Denies any hot flashes or arthralgias or myalgias. Right arm discomfort especially when she lifts heavy objects. 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  Breast cancer surveillance: 1.  Left breast mammogram: 08/07/2019: Benign breast density category B 2.  Breast exam: Benign 10/06/2019 3.  CT chest performed for lung nodule evaluation: 4 mm lung nodule  Return to clinic in 1 year for follow-up  No orders of the defined types were placed in this encounter.  The patient has a good understanding of the overall plan. she agrees with it. she will call with any problems that may develop before the next visit here.  Total time spent: 20 mins including face to face time and time spent for planning, charting and coordination of care  Nicholas Lose, MD 10/06/2019  I, Cloyde Reams Dorshimer, am acting as scribe for Dr. Nicholas Lose.  I have reviewed the above documentation for accuracy and completeness, and I agree with the above.

## 2019-10-06 ENCOUNTER — Inpatient Hospital Stay: Payer: Medicare HMO | Attending: Hematology and Oncology | Admitting: Hematology and Oncology

## 2019-10-06 ENCOUNTER — Other Ambulatory Visit: Payer: Self-pay

## 2019-10-06 DIAGNOSIS — Z170 Estrogen receptor positive status [ER+]: Secondary | ICD-10-CM

## 2019-10-06 DIAGNOSIS — Z9011 Acquired absence of right breast and nipple: Secondary | ICD-10-CM | POA: Insufficient documentation

## 2019-10-06 DIAGNOSIS — Z79811 Long term (current) use of aromatase inhibitors: Secondary | ICD-10-CM | POA: Diagnosis not present

## 2019-10-06 DIAGNOSIS — Z9221 Personal history of antineoplastic chemotherapy: Secondary | ICD-10-CM | POA: Diagnosis not present

## 2019-10-06 DIAGNOSIS — Z923 Personal history of irradiation: Secondary | ICD-10-CM | POA: Diagnosis not present

## 2019-10-06 DIAGNOSIS — C50411 Malignant neoplasm of upper-outer quadrant of right female breast: Secondary | ICD-10-CM

## 2019-10-06 MED ORDER — TAMOXIFEN CITRATE 20 MG PO TABS: 10.0000 mg | ORAL_TABLET | Freq: Every day | ORAL | 3 refills | Status: DC

## 2019-10-06 NOTE — Assessment & Plan Note (Signed)
Breast cancer of upper-outer quadrant of right female breast ([place]) Rt Biopsy 11oclock and 2 o clock: Grade 3 IDC with LVI; LN biopsy: Positive IDC; 11oclock: Er 100%, PR 90%, Ki 67: 90%; Her 2 Neg Heterogeneous; LN: Er 100%, PR: 90%; Ki67: 60%; Her 2 Pos Ratio 2.37; 2oclock: Er 100%, PR 90%; Ki 67: 90%; Her 2 Positive 2.52 Breast MRI on 08/13/2016: 8 cm span of breast cancer with multiple enlarged axillary lymph nodes. CT CAP: 08/17/16: 4 mm right middle lobe pulmonary nodule unlikely to be metastatic disease Bone scan 08/17/2016: No evidence of metastatic disease  Echocardiogram performed on 05/17/2017 showed a left ventricular ejection fraction of 60-65%.  Treatment summary: 1. Neoadjuvant chemotherapy with [place] Perjeta 6 cyclesgiven1/10/18 to 12/01/16 followed by Herceptin Perjetamaintenance for 1 year  2. Followed by mastectomy with axillary node dissection 01/03/2017 3. Adjuvant radiation therapycompleted 03/26/2017 4. Followed by Anti-estrogen therapy started 04/06/2017 5. Followed by Neratinib for 1 yearstarted 09/07/2017 -------------------------------------------------------------------------------------------------------------------------------------------------------------------------------------------------------------------------------------- 01/04/2007 Right mastectomy: Multiple IDC with micropapillary features, LVI, grade 3, 1.5, 0.6, 0.5 cm, 12/20 lymph nodes positive for cancer, margins negative, ER 100%, PR 90%, HER-2 positive, Ki-67 60%,ypT1c ypN3 RCB class III; stage IIIB  Adjuvant radiation therapy started 07/09/2018completed 03/26/2017  Current treatment:Neratinib (started 09/01/17 discontinued 10/04/2017) andAnastrozole started 04/06/17 switched to tamoxifen 10/03/2018  Tamoxifen toxicities:  Breast cancer surveillance: 1.  Left breast mammogram: 08/07/2019: Benign breast density category B 2.  Breast exam: Benign 10/06/2019 3.  CT chest performed for lung nodule  evaluation: 4 mm lung nodule  Return to clinic in 1 year for follow-up

## 2019-10-07 ENCOUNTER — Telehealth: Payer: Self-pay | Admitting: Hematology and Oncology

## 2019-10-07 NOTE — Telephone Encounter (Signed)
I talk with patient regarding schedule  

## 2019-12-19 ENCOUNTER — Other Ambulatory Visit: Payer: Self-pay | Admitting: Hematology and Oncology

## 2020-02-24 ENCOUNTER — Other Ambulatory Visit: Payer: Self-pay | Admitting: *Deleted

## 2020-02-24 ENCOUNTER — Other Ambulatory Visit: Payer: Self-pay | Admitting: Family Medicine

## 2020-02-24 DIAGNOSIS — E039 Hypothyroidism, unspecified: Secondary | ICD-10-CM

## 2020-02-24 MED ORDER — TAMOXIFEN CITRATE 20 MG PO TABS: 10.0000 mg | ORAL_TABLET | Freq: Every day | ORAL | 3 refills | Status: DC

## 2020-02-24 NOTE — Telephone Encounter (Signed)
[facility]. Needs repeat labwork. Mail order not sent

## 2020-03-04 ENCOUNTER — Other Ambulatory Visit: Payer: Self-pay

## 2020-03-04 ENCOUNTER — Encounter: Payer: Self-pay | Admitting: Family

## 2020-03-04 ENCOUNTER — Ambulatory Visit (INDEPENDENT_AMBULATORY_CARE_PROVIDER_SITE_OTHER): Payer: Medicare HMO | Admitting: Family

## 2020-03-04 VITALS — BP 145/81 | HR 77 | Temp 97.5°F | Ht 67.0 in | Wt 184.0 lb

## 2020-03-04 DIAGNOSIS — E039 Hypothyroidism, unspecified: Secondary | ICD-10-CM | POA: Diagnosis not present

## 2020-03-04 DIAGNOSIS — R42 Dizziness and giddiness: Secondary | ICD-10-CM

## 2020-03-04 DIAGNOSIS — J019 Acute sinusitis, unspecified: Secondary | ICD-10-CM | POA: Diagnosis not present

## 2020-03-04 MED ORDER — AMOXICILLIN-POT CLAVULANATE 875-125 MG PO TABS: 1.0000 | ORAL_TABLET | Freq: Two times a day (BID) | ORAL | 0 refills | Status: DC

## 2020-03-04 MED ORDER — CETIRIZINE HCL 10 MG PO TABS: 10.0000 mg | ORAL_TABLET | Freq: Every day | ORAL | 11 refills | Status: DC

## 2020-03-04 MED ORDER — MECLIZINE HCL 25 MG PO TABS: 25.0000 mg | ORAL_TABLET | Freq: Three times a day (TID) | ORAL | 0 refills | Status: DC | PRN

## 2020-03-04 MED ORDER — FLUCONAZOLE 150 MG PO TABS
150.0000 mg | ORAL_TABLET | ORAL | 0 refills | Status: DC | PRN
Start: 2020-03-04 — End: 2020-10-06

## 2020-03-04 NOTE — Patient Instructions (Signed)
Sinusitis, Adult Sinusitis is inflammation of your sinuses. Sinuses are hollow spaces in the bones around your face. Your sinuses are located:  Around your eyes.  In the middle of your forehead.  Behind your nose.  In your cheekbones. Mucus normally drains out of your sinuses. When your nasal tissues become inflamed or swollen, mucus can become trapped or blocked. This allows bacteria, viruses, and fungi to grow, which leads to infection. Most infections of the sinuses are caused by a virus. Sinusitis can develop quickly. It can last for up to 4 weeks (acute) or for more than 12 weeks (chronic). Sinusitis often develops after a cold. What are the causes? This condition is caused by anything that creates swelling in the sinuses or stops mucus from draining. This includes:  Allergies.  Asthma.  Infection from bacteria or viruses.  Deformities or blockages in your nose or sinuses.  Abnormal growths in the nose (nasal polyps).  Pollutants, such as chemicals or irritants in the air.  Infection from fungi (rare). What increases the risk? You are more likely to develop this condition if you:  Have a weak body defense system (immune system).  Do a lot of swimming or diving.  Overuse nasal sprays.  Smoke. What are the signs or symptoms? The main symptoms of this condition are pain and a feeling of pressure around the affected sinuses. Other symptoms include:  Stuffy nose or congestion.  Thick drainage from your nose.  Swelling and warmth over the affected sinuses.  Headache.  Upper toothache.  A cough that may get worse at night.  Extra mucus that collects in the throat or the back of the nose (postnasal drip).  Decreased sense of smell and taste.  Fatigue.  A fever.  Sore throat.  Bad breath. How is this diagnosed? This condition is diagnosed based on:  Your symptoms.  Your medical history.  A physical exam.  Tests to find out if your condition is  acute or chronic. This may include: ? Checking your nose for nasal polyps. ? Viewing your sinuses using a device that has a light (endoscope). ? Testing for allergies or bacteria. ? Imaging tests, such as an MRI or CT scan. In rare cases, a bone biopsy may be done to rule out more serious types of fungal sinus disease. How is this treated? Treatment for sinusitis depends on the cause and whether your condition is chronic or acute.  If caused by a virus, your symptoms should go away on their own within 10 days. You may be given medicines to relieve symptoms. They include: ? Medicines that shrink swollen nasal passages (topical intranasal decongestants). ? Medicines that treat allergies (antihistamines). ? A spray that eases inflammation of the nostrils (topical intranasal corticosteroids). ? Rinses that help get rid of thick mucus in your nose (nasal saline washes).  If caused by bacteria, your health care provider may recommend waiting to see if your symptoms improve. Most bacterial infections will get better without antibiotic medicine. You may be given antibiotics if you have: ? A severe infection. ? A weak immune system.  If caused by narrow nasal passages or nasal polyps, you may need to have surgery. Follow these instructions at home: Medicines  Take, use, or apply over-the-counter and prescription medicines only as told by your health care provider. These may include nasal sprays.  If you were prescribed an antibiotic medicine, take it as told by your health care provider. Do not stop taking the antibiotic even if you start   to feel better. Hydrate and humidify   Drink enough fluid to keep your urine pale yellow. Staying hydrated will help to thin your mucus.  Use a cool mist humidifier to keep the humidity level in your home above 50%.  Inhale steam for 10-15 minutes, 3-4 times a day, or as told by your health care provider. You can do this in the bathroom while a hot shower is  running.  Limit your exposure to cool or dry air. Rest  Rest as much as possible.  Sleep with your head raised (elevated).  Make sure you get enough sleep each night. General instructions   Apply a warm, moist washcloth to your face 3-4 times a day or as told by your health care provider. This will help with discomfort.  Wash your hands often with soap and water to reduce your exposure to germs. If soap and water are not available, use hand sanitizer.  Do not smoke. Avoid being around people who are smoking (secondhand smoke).  Keep all follow-up visits as told by your health care provider. This is important. Contact a health care provider if:  You have a fever.  Your symptoms get worse.  Your symptoms do not improve within 10 days. Get help right away if:  You have a severe headache.  You have persistent vomiting.  You have severe pain or swelling around your face or eyes.  You have vision problems.  You develop confusion.  Your neck is stiff.  You have trouble breathing. Summary  Sinusitis is soreness and inflammation of your sinuses. Sinuses are hollow spaces in the bones around your face.  This condition is caused by nasal tissues that become inflamed or swollen. The swelling traps or blocks the flow of mucus. This allows bacteria, viruses, and fungi to grow, which leads to infection.  If you were prescribed an antibiotic medicine, take it as told by your health care provider. Do not stop taking the antibiotic even if you start to feel better.  Keep all follow-up visits as told by your health care provider. This is important. This information is not intended to replace advice given to you by your health care provider. Make sure you discuss any questions you have with your health care provider. Document Revised: 12/24/2017 Document Reviewed: 12/24/2017 Elsevier Patient Education  2020 [employer]  

## 2020-03-04 NOTE — Progress Notes (Signed)
Subjective:    Patient ID: Cynthia Chavez, female    DOB: 05-29-1956, 64 y.o.   MRN: [id number]  Chief Complaint  Patient presents with  . Ear Fullness    both ears   . Dizziness  . Headache   PT presents to the office today with dizziness that started over the 2-3   Weeks that has worsen over the last week. She reports she has been bending over picking blueberries and mowing. She reports bilateral ear fullness and pain that is worse in the left ear.  Ear Fullness  There is pain in both ears. The current episode started 1 to 4 weeks ago. There has been no fever. The pain is at a severity of 9/10. Associated symptoms include headaches. Pertinent negatives include no ear discharge or hearing loss. She has tried acetaminophen for the symptoms. The treatment provided mild relief.  Dizziness This is a new problem. The current episode started 1 to 4 weeks ago. The problem occurs constantly. The problem has been unchanged. Associated symptoms include congestion, fatigue, headaches, nausea and vertigo. The symptoms are aggravated by twisting and bending. She has tried lying down for the symptoms. The treatment provided mild relief.  Headache  Associated symptoms include dizziness and nausea. Pertinent negatives include no hearing loss.  Thyroid Problem Presents for follow-up visit. Symptoms include fatigue and hoarse voice. The symptoms have been stable.      Review of Systems  Constitutional: Positive for fatigue.  HENT: Positive for congestion and hoarse voice. Negative for ear discharge and hearing loss.   Gastrointestinal: Positive for nausea.  Neurological: Positive for dizziness, vertigo and headaches.  All other systems reviewed and are negative.      Objective:   Physical Exam Vitals reviewed.  Constitutional:      General: She is not in acute distress.    Appearance: She is well-developed.  HENT:     Head: Normocephalic and atraumatic.     Right Ear: Tympanic membrane  normal.     Left Ear: Tympanic membrane normal.     Nose:     Right Sinus: Frontal sinus tenderness present.     Left Sinus: Frontal sinus tenderness present.     Mouth/Throat:     Comments: Hoarse voice Eyes:     Pupils: Pupils are equal, round, and reactive to light.  Neck:     Thyroid: No thyromegaly.  Cardiovascular:     Rate and Rhythm: Normal rate and regular rhythm.     Heart sounds: Normal heart sounds. No murmur heard.   Pulmonary:     Effort: Pulmonary effort is normal. No respiratory distress.     Breath sounds: Normal breath sounds. No wheezing.  Abdominal:     General: Bowel sounds are normal. There is no distension.     Palpations: Abdomen is soft.     Tenderness: There is no abdominal tenderness.  Musculoskeletal:        General: No tenderness. Normal range of motion.     Cervical back: Normal range of motion and neck supple.  Skin:    General: Skin is warm and dry.  Neurological:     Mental Status: She is alert and oriented to person, place, and time.     Cranial Nerves: No cranial nerve deficit.     Deep Tendon Reflexes: Reflexes are normal and symmetric.  Psychiatric:        Behavior: Behavior normal.        Thought Content: Thought content  normal.        Judgment: Judgment normal.       BP (!) 145/81   Pulse 77   Temp (!) 97.5 F (36.4 C) (Temporal)   Ht 5\' 7"  (1.702 m)   Wt 184 lb (83.5 kg)   SpO2 99%   BMI 28.82 kg/m      Assessment & Plan:  PATRICI MINNIS comes in today with chief complaint of Ear Fullness (both ears ), Dizziness, and Headache   Diagnosis and orders addressed:  1. Vertigo Fall precautions discussed Avoid fast position changes - meclizine (ANTIVERT) 25 MG tablet; Take 1 tablet (25 mg total) by mouth 3 (three) times daily as needed for dizziness.  Dispense: 30 tablet; Refill: 0  2. Acute sinusitis, recurrence not specified, unspecified location - Take meds as prescribed - Use a cool mist humidifier  -Use saline nose  sprays frequently -Force fluids -For any cough or congestion  Use plain Mucinex- regular strength or max strength is fine -For fever or aces or pains- take tylenol or ibuprofen. -Throat lozenges if help - amoxicillin-clavulanate (AUGMENTIN) 875-125 MG tablet; Take 1 tablet by mouth 2 (two) times daily.  Dispense: 14 tablet; Refill: 0 - cetirizine (ZYRTEC) 10 MG tablet; Take 1 tablet (10 mg total) by mouth daily.  Dispense: 30 tablet; Refill: 11  3. Hypothyroidism, unspecified type - TSH   Labs pending Health Maintenance reviewed Diet and exercise encouraged     Evelina Dun, FNP

## 2020-03-05 ENCOUNTER — Telehealth: Payer: Self-pay | Admitting: Family Medicine

## 2020-03-05 LAB — TSH: TSH: 3.1 u[IU]/mL (ref 0.450–4.500)

## 2020-03-05 MED ORDER — DOXYCYCLINE HYCLATE 100 MG PO TABS
100.0000 mg | ORAL_TABLET | Freq: Two times a day (BID) | ORAL | 0 refills | Status: DC
Start: 2020-03-05 — End: 2020-04-06

## 2020-03-05 NOTE — Telephone Encounter (Signed)
Patient notified and verbalized understanding. 

## 2020-03-05 NOTE — Telephone Encounter (Signed)
Will send in doxycycline, stop Augmentin.

## 2020-03-05 NOTE — Telephone Encounter (Signed)
Patient had an appointment with [facility] yesterday and was give 3 new medications- meclizine, Augmentin, & diflucan. States that one of the medication is causing her hernia to hurt and make her nauseated.  She is not sure which one since she had taken them all.  Please advise what patient should do.

## 2020-03-05 NOTE — Telephone Encounter (Signed)
Augmentin some times can cause upset stomach, but should not be causing pain in her hernia. Make sure you take with food.

## 2020-03-05 NOTE — Addendum Note (Signed)
Addended by: Evelina Dun A on: 03/05/2020 04:38 PM   Modules accepted: Orders

## 2020-03-05 NOTE — Telephone Encounter (Signed)
Pt states that one of the medications is causing her stomach to hurt and making her nauseated and she is wanting to know if she should come off that medication.

## 2020-03-05 NOTE — Telephone Encounter (Signed)
Advised that Augmentin could cause upset stomach. Patient states that she is taking with food. Patient also stated that she is allergic to penicillin. Advised patient that is not on her allergy list and she has taken before but she states that she cannot tolerate. Wants to know if she can have doxycycline instead? Please advise

## 2020-03-10 ENCOUNTER — Other Ambulatory Visit: Payer: Self-pay

## 2020-03-10 ENCOUNTER — Encounter: Payer: Self-pay | Admitting: Dermatology

## 2020-03-10 ENCOUNTER — Ambulatory Visit: Payer: Medicare HMO | Admitting: Dermatology

## 2020-03-10 DIAGNOSIS — D485 Neoplasm of uncertain behavior of skin: Secondary | ICD-10-CM

## 2020-03-10 DIAGNOSIS — D229 Melanocytic nevi, unspecified: Secondary | ICD-10-CM

## 2020-03-10 DIAGNOSIS — D225 Melanocytic nevi of trunk: Secondary | ICD-10-CM | POA: Diagnosis not present

## 2020-03-10 NOTE — Patient Instructions (Signed)

## 2020-03-12 ENCOUNTER — Telehealth: Payer: Self-pay | Admitting: Family Medicine

## 2020-03-12 DIAGNOSIS — E039 Hypothyroidism, unspecified: Secondary | ICD-10-CM

## 2020-03-12 MED ORDER — LEVOTHYROXINE SODIUM 150 MCG PO TABS: 150.0000 ug | ORAL_TABLET | Freq: Every day | ORAL | 1 refills | Status: DC

## 2020-03-12 NOTE — Telephone Encounter (Signed)
Refill sent to pharmacy and pt is aware. 

## 2020-04-02 ENCOUNTER — Telehealth: Payer: Self-pay | Admitting: Family Medicine

## 2020-04-02 NOTE — Telephone Encounter (Signed)
Again, if she is requiring antibiotics it is necessary that she be seen first.  Consider urgent care.

## 2020-04-02 NOTE — Telephone Encounter (Signed)
That was a month ago.  She needs to be seen if she has recurrent issues.

## 2020-04-02 NOTE — Telephone Encounter (Signed)
Patient aware.

## 2020-04-06 ENCOUNTER — Telehealth: Payer: Self-pay | Admitting: Family Medicine

## 2020-04-06 ENCOUNTER — Encounter: Payer: Self-pay | Admitting: Family Medicine

## 2020-04-06 ENCOUNTER — Ambulatory Visit (INDEPENDENT_AMBULATORY_CARE_PROVIDER_SITE_OTHER): Payer: Medicare HMO | Admitting: Family Medicine

## 2020-04-06 DIAGNOSIS — H6593 Unspecified nonsuppurative otitis media, bilateral: Secondary | ICD-10-CM

## 2020-04-06 MED ORDER — CIPROFLOXACIN-DEXAMETHASONE 0.3-0.1 % OT SUSP: OTIC | 0 refills | Status: DC

## 2020-04-06 MED ORDER — CEFPROZIL 500 MG PO TABS
500.0000 mg | ORAL_TABLET | Freq: Two times a day (BID) | ORAL | 0 refills | Status: DC
Start: 2020-04-06 — End: 2020-10-06

## 2020-04-06 NOTE — Progress Notes (Signed)
Subjective:    Patient ID: Cynthia Chavez, female    DOB: 1956/02/26, 64 y.o.   MRN: [id number]   HPI: Cynthia Chavez is a 64 y.o. female presenting for feeling a liquid in her ear. Feels it moving around when she presses on it. Took the antibiotics. Got better until about a week ago. OTC drops helped some. The last two days feels pain running into her nek when she presses it.   Depression screen [facility] 2/9 03/04/2020 08/11/2019 11/11/2018 03/22/2018 11/15/2017  Decreased Interest 0 0 0 0 0  Down, Depressed, Hopeless 0 0 0 0 0  PHQ - 2 Score 0 0 0 0 0  Altered sleeping - - 0 - -  Tired, decreased energy - - 0 - -  Change in appetite - - 0 - -  Feeling bad or failure about yourself  - - 0 - -  Trouble concentrating - - 0 - -  Moving slowly or fidgety/restless - - 0 - -  Suicidal thoughts - - 0 - -  PHQ-9 Score - - 0 - -  Some recent data might be hidden     Relevant past medical, surgical, family and social history reviewed and updated as indicated.  Interim medical history since our last visit reviewed. Allergies and medications reviewed and updated.  ROS:  Review of Systems   Social History   Tobacco Use  Smoking Status Current Every Day Smoker  . Packs/day: 0.50  . Years: 25.00  . Pack years: 12.50  . Types: Cigarettes  . Start date: 04/19/1993  Smokeless Tobacco Never Used  Tobacco Comment   she stopped smoking for a long time, but started back.        Objective:     Wt Readings from Last 3 Encounters:  03/04/20 184 lb (83.5 kg)  10/06/19 191 lb 11.2 oz (87 kg)  08/11/19 187 lb (84.8 kg)     Exam deferred. Pt. Harboring due to COVID 19. Phone visit performed.   Assessment & Plan:  No diagnosis found.  Meds ordered this encounter  Medications  . ciprofloxacin-dexamethasone (CIPRODEX) OTIC suspension    Sig: Place four drops in Affected ear(s) twice daily for one week    Dispense:  7.5 mL    Refill:  0  . cefPROZIL (CEFZIL) 500 MG tablet    Sig: Take 1  tablet (500 mg total) by mouth 2 (two) times daily. For infection. Take all of this medication.    Dispense:  20 tablet    Refill:  0    No orders of the defined types were placed in this encounter.     There are no diagnoses linked to this encounter.  Virtual Visit via telephone Note  I discussed the limitations, risks, security and privacy concerns of performing an evaluation and management service by telephone and the availability of in person appointments. The patient was identified with two identifiers. Pt.expressed understanding and agreed to proceed. Pt. Is at home. Dr. Livia Snellen is in his office.  Follow Up Instructions:   I discussed the assessment and treatment plan with the patient. The patient was provided an opportunity to ask questions and all were answered. The patient agreed with the plan and demonstrated an understanding of the instructions.   The patient was advised to call back or seek an in-person evaluation if the symptoms worsen or if the condition fails to improve as anticipated.   Total minutes including chart review and phone contact time:  9   Follow up plan: No follow-ups on file.  Claretta Fraise, MD [place]

## 2020-04-06 NOTE — Telephone Encounter (Signed)
Pt scheduled with Dr Livia Snellen today at 8:55 for televisit.

## 2020-04-07 ENCOUNTER — Other Ambulatory Visit: Payer: Self-pay | Admitting: Family Medicine

## 2020-04-07 MED ORDER — NEOMYCIN-POLYMYXIN-HC 3.5-10000-1 OT SOLN
4.0000 [drp] | Freq: Four times a day (QID) | OTIC | 0 refills | Status: DC
Start: 2020-04-07 — End: 2020-10-06

## 2020-04-07 NOTE — Telephone Encounter (Signed)
Please let the patient know that I sent their prescription to their pharmacy. Thanks, WS 

## 2020-04-07 NOTE — Telephone Encounter (Signed)
Patient aware and verbalized understanding. °

## 2020-04-07 NOTE — Telephone Encounter (Signed)
Patient had visit with you yesterday.

## 2020-04-24 ENCOUNTER — Encounter: Payer: Self-pay | Admitting: Dermatology

## 2020-04-24 NOTE — Progress Notes (Signed)
   New Patient   Subjective  Cynthia Chavez is a 64 y.o. female who presents for the following: New Patient (Initial Visit) (Pt stated--left side upper/lower --spot--discoloration, elevated, sore, itching especially with pressure--6 month.). Growth Location: Left side Duration:  Quality:  Associated Signs/Symptoms: Modifying Factors:  Severity:  Timing: Context:    The following portions of the chart were reviewed this encounter and updated as appropriate: Tobacco  Allergies  Meds  Problems  Med Hx  Surg Hx  Fam Hx      Objective  Well appearing patient in no apparent distress; mood and affect are within normal limits.  All sun exposed areas plus back examined.   Assessment & Plan  Neoplasm of uncertain behavior of skin Left Upper Back  Skin / nail biopsy Type of biopsy: tangential   Informed consent: discussed and consent obtained   Timeout: patient name, date of birth, surgical site, and procedure verified   Procedure prep:  Patient was prepped and draped in usual sterile fashion Prep type:  Chlorhexidine Anesthesia: the lesion was anesthetized in a standard fashion   Anesthetic:  1% lidocaine w/ epinephrine 1-100,000 local infiltration Instrument used: flexible razor blade   Hemostasis achieved with: ferric subsulfate   Outcome: patient tolerated procedure well   Post-procedure details: wound care instructions given    Specimen 1 - Surgical pathology Differential Diagnosis: r/o CN, atypia Check Margins: No

## 2020-06-28 ENCOUNTER — Other Ambulatory Visit: Payer: Self-pay | Admitting: General Surgery

## 2020-06-28 DIAGNOSIS — Z1231 Encounter for screening mammogram for malignant neoplasm of breast: Secondary | ICD-10-CM

## 2020-08-12 ENCOUNTER — Other Ambulatory Visit: Payer: Self-pay

## 2020-08-12 ENCOUNTER — Ambulatory Visit
Admission: RE | Admit: 2020-08-12 | Discharge: 2020-08-12 | Disposition: A | Discharge status: Home / Self Care | Payer: Medicare HMO | Source: Ambulatory Visit | Attending: General Surgery | Admitting: General Surgery

## 2020-08-12 DIAGNOSIS — Z1231 Encounter for screening mammogram for malignant neoplasm of breast: Secondary | ICD-10-CM

## 2020-10-05 NOTE — Assessment & Plan Note (Signed)
Rt Biopsy 11oclock and 2 o clock: Grade 3 IDC with LVI; LN biopsy: Positive IDC; 11oclock: Er 100%, PR 90%, Ki 67: 90%; Her 2 Neg Heterogeneous; LN: Er 100%, PR: 90%; Ki67: 60%; Her 2 Pos Ratio 2.37; 2oclock: Er 100%, PR 90%; Ki 67: 90%; Her 2 Positive 2.52 Breast MRI on 08/13/2016: 8 cm span of breast cancer with multiple enlarged axillary lymph nodes. CT CAP: 08/17/16: 4 mm right middle lobe pulmonary nodule unlikely to be metastatic disease Bone scan 08/17/2016: No evidence of metastatic disease  Echocardiogram performed on 05/17/2017 showed a left ventricular ejection fraction of 60-65%.  Treatment summary: 1. Neoadjuvant chemotherapy with Franklin Perjeta 6 cyclesgiven1/10/18 to 12/01/16 followed by Herceptin Perjetamaintenance for 1 year  2. Followed by mastectomy with axillary node dissection 01/03/2017 3. Adjuvant radiation therapycompleted 03/26/2017 4. Followed by Anti-estrogen therapy started 04/06/2017 5. Followed by Neratinib for 1 yearstarted 09/07/2017 -------------------------------------------------------------------------------------------------------------------------------------------------------------------------------------------------------------------------------------- 01/04/2007 Right mastectomy: Multiple IDC with micropapillary features, LVI, grade 3, 1.5, 0.6, 0.5 cm, 12/20 lymph nodes positive for cancer, margins negative, ER 100%, PR 90%, HER-2 positive, Ki-67 60%,ypT1c ypN3 RCB class III; stage IIIB  Adjuvant radiation therapy started 07/09/2018completed 03/26/2017  Current treatment:Neratinib (started 1/26/19discontinued 10/04/2017) andAnastrozole started 8/31/18switched to tamoxifen 10/03/2018  Tamoxifen toxicities: Denies any hot flashes or arthralgias or myalgias. Right arm discomfort especially when she lifts heavy objects. 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  Breast cancer surveillance: 1.  Left breast mammogram: 08/12/20  Benign breast density category B 2.  Breast exam: Benign 10/05/2020 3.  CT chest performed for lung nodule evaluation: 4 mm lung nodule  Return to clinic in 1 year for follow-up

## 2020-10-05 NOTE — Progress Notes (Signed)
Patient Care Team: Janora Norlander, DO as PCP - General (Family Medicine) Lavonna Monarch, MD as Consulting Physician (Dermatology)  DIAGNOSIS:    ICD-10-CM   1. Malignant neoplasm of upper-outer quadrant of right breast in female, estrogen receptor positive ([place])  C50.411    Z17.0     SUMMARY OF ONCOLOGIC HISTORY: Oncology History  Breast cancer of upper-outer quadrant of right female breast ([place])  07/24/2016 Mammogram   Rt Breast 2 masses at 11-12:30 Position spanning 8 cm; Additional mass at 2 o clock: 1.5 cm; LN with cort thickening 2.7 cm   07/27/2016 Initial Diagnosis   Rt Biopsy 11oclock and 2 o clock: Grade 3 IDC with LVI; LN biopsy: Positive IDC; 11oclock: Er 100%, PR 90%, Ki 67: 90%; Her 2 Neg Heterogeneous; LN: Er 100%, PR: 90%; Ki67: 60%; Her 2 Pos Ratio 2.37; 2oclock: Er 100%, PR 90%; Ki 67: 90%; Her 2 Positive 2.52   08/16/2016 - 12/01/2016 Neo-Adjuvant Chemotherapy   Neoadjuvant chemotherapy with TCH Perjeta ([place]otere d/ced after 2 cycles)   12/06/2016 Breast MRI   Residual right breast 2 o'clock enhancing mass which now measures 1.3 cm in greatest dimension.Residual non measurable scattered foci of heterogeneous enhancement throughout the superior and subareolar right breast, anterior and middle depth. Diminished right axillary lymphadenopathy   01/03/2017 Surgery   Right mastectomy: Multiple IDC with micropapillary features, LVI, grade 3, 1.5, 0.6, 0.5 cm, 12/20 lymph nodes positive for cancer, margins negative, ER 100%, PR 90%, HER-2 positive, Ki-67 60%,ypT1c ypN3 RCB class III; stage IIIB   02/12/2017 - 03/26/2017 Radiation Therapy   Adjuvant radiation therapy   04/06/2017 -  Anti-estrogen oral therapy   Anastrozole 1 mg by mouth daily, could not tolerate Neratinib; stopped 10/03/18 due to severe myalgias, switched to tamoxifen started 10/17/18     CHIEF COMPLIANT: Follow-up of right breast cancer on anastrozole therapy  INTERVAL HISTORY: Cynthia Chavez is a 65  y.o. with above-mentioned history of right breast cancer treated with neoadjuvant chemotherapy, mastectomy, radiation, and who is currently on antiestrogen therapy with anastrozole. Mammogram on 08/12/20 showed no evidence of malignancy in the left breast.She presents to the clinictoday for annual follow-up.    She is tolerating tamoxifen at half a tablet daily.  Her major complaints are related to joint stiffness and fatigue.  She does have locked fingers.  She is worried about her husband who has a history of lung cancer.  ALLERGIES:  is allergic to ivp dye [iodinated diagnostic agents], other, augmentin [amoxicillin-pot clavulanate], dilaudid [hydromorphone hcl], asa [aspirin], and codeine.  MEDICATIONS:  Current Outpatient Medications  Medication Sig Dispense Refill  . cefPROZIL (CEFZIL) 500 MG tablet Take 1 tablet (500 mg total) by mouth 2 (two) times daily. For infection. Take all of this medication. 20 tablet 0  . cetirizine (ZYRTEC) 10 MG tablet Take 1 tablet (10 mg total) by mouth daily. 30 tablet 11  . fluconazole (DIFLUCAN) 150 MG tablet Take 1 tablet (150 mg total) by mouth every three (3) days as needed. 3 tablet 0  . levothyroxine (SYNTHROID) 150 MCG tablet Take 1 tablet (150 mcg total) by mouth daily before breakfast. 90 tablet 1  . neomycin-polymyxin-hydrocortisone (CORTISPORIN) OTIC solution Place 4 drops into both ears 4 (four) times daily. 10 mL 0  . tamoxifen (NOLVADEX) 20 MG tablet Take 0.5 tablets (10 mg total) by mouth daily. 90 tablet 3   No current facility-administered medications for this visit.    PHYSICAL EXAMINATION: ECOG PERFORMANCE STATUS: 1 - Symptomatic  but completely ambulatory  Vitals:   10/06/20 1404  BP: (!) 144/91  Pulse: (!) 101  Resp: 17  Temp: 97.9 F (36.6 C)  SpO2: 98%   Filed Weights   10/06/20 1404  Weight: 183 lb 1.6 oz (83.1 kg)    BREAST: No palpable masses or nodules in either right or left breasts. No palpable axillary  supraclavicular or infraclavicular adenopathy no breast tenderness or nipple discharge. (exam performed in the presence of a chaperone)  LABORATORY DATA:  I have reviewed the data as listed CMP Latest Ref Rng & Units 08/11/2019 03/22/2018 10/04/2017  Glucose 65 - 99 mg/dL 93 86 103  BUN 8 - 27 mg/dL _0 Creatinine 0.57 - 1.00 mg/dL 0.88 0.58 0.80  Sodium 134 - 144 mmol/L 140 142 143  Potassium 3.5 - 5.2 mmol/L 3.9 4.1 3.9  Chloride 96 - 106 mmol/L 104 104 108  CO2 20 - 29 mmol/L _1 Calcium 8.7 - 10.3 mg/dL 8.9 9.3 9.1  Total Protein 6.0 - 8.5 g/dL 6.5 6.2 6.4  Total Bilirubin 0.0 - 1.2 mg/dL 0.3 0.4 0.3  Alkaline Phos 39 - 117 IU/L 94 101 94  AST 0 - 40 IU/L _2 ALT 0 - 32 IU/L _3 Lab Results  Component Value Date   WBC 6.1 03/22/2018   HGB 13.0 03/22/2018   HCT 39.1 03/22/2018   MCV 95 03/22/2018   PLT 197 03/22/2018   NEUTROABS 4.3 03/22/2018    ASSESSMENT & PLAN:  Malignant neoplasm of upper-outer quadrant of right breast in female, estrogen receptor positive ([place]) Rt Biopsy 11oclock and 2 o clock: Grade 3 IDC with LVI; LN biopsy: Positive IDC; 11oclock: Er 100%, PR 90%, Ki 67: 90%; Her 2 Neg Heterogeneous; LN: Er 100%, PR: 90%; Ki67: 60%; Her 2 Pos Ratio 2.37; 2oclock: Er 100%, PR 90%; Ki 67: 90%; Her 2 Positive 2.52 Breast MRI on 08/13/2016: 8 cm span of breast cancer with multiple enlarged axillary lymph nodes. CT CAP: 08/17/16: 4 mm right middle lobe pulmonary nodule unlikely to be metastatic disease Bone scan 08/17/2016: No evidence of metastatic disease  Echocardiogram performed on 05/17/2017 showed a left ventricular ejection fraction of 60-65%.  Treatment summary: 1. Neoadjuvant chemotherapy with [place] Perjeta 6 cyclesgiven1/10/18 to 12/01/16 followed by Herceptin Perjetamaintenance for 1 year  2. Followed by mastectomy with axillary node dissection 01/03/2017 3. Adjuvant radiation therapycompleted 03/26/2017 4. Followed by Anti-estrogen  therapy started 04/06/2017 5. Followed by Neratinib for 1 yearstarted 09/07/2017 -------------------------------------------------------------------------------------------------------------------------------------------------------------------------------------------------------------------------------------- 01/04/2007 Right mastectomy: Multiple IDC with micropapillary features, LVI, grade 3, 1.5, 0.6, 0.5 cm, 12/20 lymph nodes positive for cancer, margins negative, ER 100%, PR 90%, HER-2 positive, Ki-67 60%,ypT1c ypN3 RCB class III; stage IIIB  Adjuvant radiation therapy started 07/09/2018completed 03/26/2017  Current treatment:Neratinib (started 1/26/19discontinued 10/04/2017) andAnastrozole started 8/31/18switched to tamoxifen 10/03/2018  Tamoxifen toxicities: Denies any hot flashes or arthralgias or myalgias. Right arm discomfort especially when she lifts heavy objects.   Breast cancer surveillance: 1.  Left breast mammogram: 08/12/20 Benign breast density category B 2.  Breast exam: Benign 10/05/2020 3.  CT chest performed for lung nodule evaluation: 4 mm lung nodule  Return to clinic in 1 year for follow-up    No orders of the defined types were placed in this encounter.  The patient has a good understanding of the overall plan. she agrees with it. she will call with any problems that may develop before the next visit  here.  Total time spent: 20 mins including face to face time and time spent for planning, charting and coordination of care  Rulon Eisenmenger, MD, MPH 10/06/2020  I, Cloyde Reams Dorshimer, am acting as scribe for Dr. Nicholas Lose.  I have reviewed the above documentation for accuracy and completeness, and I agree with the above.

## 2020-10-06 ENCOUNTER — Inpatient Hospital Stay: Payer: Medicare HMO | Attending: Hematology and Oncology | Admitting: Hematology and Oncology

## 2020-10-06 ENCOUNTER — Other Ambulatory Visit: Payer: Self-pay

## 2020-10-06 DIAGNOSIS — Z79811 Long term (current) use of aromatase inhibitors: Secondary | ICD-10-CM | POA: Diagnosis not present

## 2020-10-06 DIAGNOSIS — Z923 Personal history of irradiation: Secondary | ICD-10-CM | POA: Insufficient documentation

## 2020-10-06 DIAGNOSIS — C50411 Malignant neoplasm of upper-outer quadrant of right female breast: Secondary | ICD-10-CM | POA: Insufficient documentation

## 2020-10-06 DIAGNOSIS — Z170 Estrogen receptor positive status [ER+]: Secondary | ICD-10-CM

## 2020-10-06 DIAGNOSIS — Z9221 Personal history of antineoplastic chemotherapy: Secondary | ICD-10-CM | POA: Insufficient documentation

## 2020-10-17 ENCOUNTER — Emergency Department (HOSPITAL_BASED_OUTPATIENT_CLINIC_OR_DEPARTMENT_OTHER)
Admission: EM | Admit: 2020-10-17 | Discharge: 2020-10-17 | Disposition: A | Discharge status: Home / Self Care | Payer: Medicare HMO | Attending: Emergency Medicine | Admitting: Emergency Medicine

## 2020-10-17 ENCOUNTER — Emergency Department (HOSPITAL_BASED_OUTPATIENT_CLINIC_OR_DEPARTMENT_OTHER): Payer: Medicare HMO

## 2020-10-17 ENCOUNTER — Other Ambulatory Visit: Payer: Self-pay

## 2020-10-17 ENCOUNTER — Encounter (HOSPITAL_BASED_OUTPATIENT_CLINIC_OR_DEPARTMENT_OTHER): Payer: Self-pay

## 2020-10-17 DIAGNOSIS — R630 Anorexia: Secondary | ICD-10-CM | POA: Insufficient documentation

## 2020-10-17 DIAGNOSIS — R5383 Other fatigue: Secondary | ICD-10-CM | POA: Insufficient documentation

## 2020-10-17 DIAGNOSIS — E039 Hypothyroidism, unspecified: Secondary | ICD-10-CM | POA: Insufficient documentation

## 2020-10-17 DIAGNOSIS — Z853 Personal history of malignant neoplasm of breast: Secondary | ICD-10-CM | POA: Diagnosis not present

## 2020-10-17 DIAGNOSIS — I80292 Phlebitis and thrombophlebitis of other deep vessels of left lower extremity: Secondary | ICD-10-CM | POA: Insufficient documentation

## 2020-10-17 DIAGNOSIS — F17210 Nicotine dependence, cigarettes, uncomplicated: Secondary | ICD-10-CM | POA: Diagnosis not present

## 2020-10-17 DIAGNOSIS — I808 Phlebitis and thrombophlebitis of other sites: Secondary | ICD-10-CM | POA: Diagnosis not present

## 2020-10-17 DIAGNOSIS — I8002 Phlebitis and thrombophlebitis of superficial vessels of left lower extremity: Secondary | ICD-10-CM

## 2020-10-17 DIAGNOSIS — M79652 Pain in left thigh: Secondary | ICD-10-CM | POA: Diagnosis not present

## 2020-10-17 DIAGNOSIS — M79605 Pain in left leg: Secondary | ICD-10-CM | POA: Diagnosis present

## 2020-10-17 DIAGNOSIS — M7989 Other specified soft tissue disorders: Secondary | ICD-10-CM | POA: Diagnosis not present

## 2020-10-17 LAB — CBC WITH DIFFERENTIAL/PLATELET
Abs Immature Granulocytes: 0.02 10*3/uL (ref 0.00–0.07)
Basophils Absolute: 0 10*3/uL (ref 0.0–0.1)
Basophils Relative: 0 %
Eosinophils Absolute: 0.2 10*3/uL (ref 0.0–0.5)
Eosinophils Relative: 2 %
HCT: 42.2 % (ref 36.0–46.0)
Hemoglobin: 14 g/dL (ref 12.0–15.0)
Immature Granulocytes: 0 %
Lymphocytes Relative: 12 %
Lymphs Abs: 1 10*3/uL (ref 0.7–4.0)
MCH: 32 pg (ref 26.0–34.0)
MCHC: 33.2 g/dL (ref 30.0–36.0)
MCV: 96.3 fL (ref 80.0–100.0)
Monocytes Absolute: 0.5 10*3/uL (ref 0.1–1.0)
Monocytes Relative: 7 %
Neutro Abs: 6.1 10*3/uL (ref 1.7–7.7)
Neutrophils Relative %: 79 %
Platelets: 141 10*3/uL — ABNORMAL LOW (ref 150–400)
RBC: 4.38 MIL/uL (ref 3.87–5.11)
RDW: 12.4 % (ref 11.5–15.5)
WBC: 7.8 10*3/uL (ref 4.0–10.5)
nRBC: 0 % (ref 0.0–0.2)

## 2020-10-17 LAB — COMPREHENSIVE METABOLIC PANEL
ALT: 16 U/L (ref 0–44)
AST: 18 U/L (ref 15–41)
Albumin: 3.8 g/dL (ref 3.5–5.0)
Alkaline Phosphatase: 62 U/L (ref 38–126)
Anion gap: 10 (ref 5–15)
BUN: 12 mg/dL (ref 8–23)
CO2: 24 mmol/L (ref 22–32)
Calcium: 8.8 mg/dL — ABNORMAL LOW (ref 8.9–10.3)
Chloride: 103 mmol/L (ref 98–111)
Creatinine, Ser: 0.73 mg/dL (ref 0.44–1.00)
GFR, Estimated: >60 mL/min (ref >60)
Glucose, Bld: 104 mg/dL — ABNORMAL HIGH (ref 70–99)
Potassium: 4 mmol/L (ref 3.5–5.1)
Sodium: 137 mmol/L (ref 135–145)
Total Bilirubin: 0.6 mg/dL (ref 0.3–1.2)
Total Protein: 7.1 g/dL (ref 6.5–8.1)

## 2020-10-17 NOTE — Discharge Instructions (Signed)
Please start taking an 81 mg baby aspirin once per day.  I would also recommend you take 600 to 800 mg of ibuprofen 2-3 times per day.  This will help with your pain, swelling, and hopefully help resolve the blood clot.  Apply warm compresses to the affected area of your leg and elevate the leg as much as possible.  Please follow-up with your regular doctor or oncologist in the next 7 to 10 days.  They need to reevaluate this and make sure that it is not worsening.  If you feel that your pain and symptoms do progressively worsen, it is reasonable to come back to the emergency department for reevaluation.  If you develop any chest pain or shortness of breath, you should come back to the emergency department immediately.  It was a pleasure to meet you.

## 2020-10-17 NOTE — ED Triage Notes (Signed)
Pt arrives with pain to left leg starting Thursday reports it has been red, sore to the touch, and radiates from behind her left knee, up her inner posterior left leg. Pt does smoke.

## 2020-10-17 NOTE — ED Provider Notes (Signed)
[name] EMERGENCY DEPARTMENT Provider Note   CSN: [id number] Arrival date & time: 10/17/20  1120     History Chief Complaint  Patient presents with  . Leg Pain    Cynthia Chavez is a 65 y.o. female.  HPI   Patient is a 65 year old female with a history of breast cancer in remission, hypothyroidism, who presents the emergency department due to leg pain and swelling.  Patient states about 3 days ago she began experiencing a region of erythema, swelling, as well as moderate pain that started along the posterior knee and began climbing along the left posterior thigh.  Her symptoms have been constant.  Has not tried anything for her symptoms.  Also reports associated fatigue and decreased appetite.  She has no other complaints at this time.  Denies fevers, chills, chest pain, shortness of breath, nausea, vomiting, diarrhea, abdominal pain, urinary complaints.  Patient is currently taking tamoxifen and is a smoker.  Denies any cough or hemoptysis.  No recent travel.  No recent surgeries.  No history of blood clots.     Past Medical History:  Diagnosis Date  . Anxiety   . Breast cancer ([place])   . Cancer [facility])    breast cancer  . Depression   . Edema    bilateral feet and leg swelling  . GERD (gastroesophageal reflux disease)   . History of hiatal hernia   . History of radiation therapy 02/12/17- 03/26/17   Chest wall and regional nodes, right- 50 Gy in 25 fractions, Chest Wall, boost, right- 10 Gy in 5 fractions.   . Hypothyroidism   . Malaise   . Personal history of chemotherapy   . Personal history of radiation therapy   . Thyroid disease   . Varicose veins     Patient Active Problem List   Diagnosis Date Noted  . Port catheter in place 09/08/2016  . Malignant neoplasm of upper-outer quadrant of right breast in female, estrogen receptor positive ([place]) 08/14/2016  . Breast cancer of upper-outer quadrant of right female breast ([place]) 08/13/2016  . Varicose veins of  lower extremities with other complications 32/35/5732  . Hypothyroidism 04/24/2013    Past Surgical History:  Procedure Laterality Date  . ABDOMINAL HYSTERECTOMY    . AXILLARY LYMPH NODE BIOPSY Right 01/03/2017   Procedure: RIGHT AXILLARY LYMPH NODE SEED GUIDED EXCISIONAL BIOPSY;  Surgeon: Rolm Bookbinder, MD;  Location: [place];  Service: General;  Laterality: Right;  . BREAST SURGERY    . CHOLECYSTECTOMY    . COLONOSCOPY    . ESOPHAGEAL DILATION    . MASTECTOMY Right   . MASTECTOMY W/ SENTINEL NODE BIOPSY Right 01/03/2017   axillary  . MASTECTOMY W/ SENTINEL NODE BIOPSY Right 01/03/2017   Procedure: RIGHT TOTAL MASTECTOMY WITH RIGHT AXILLARY SENTINEL LYMPH NODE BIOPSY;  Surgeon: Rolm Bookbinder, MD;  Location: [place];  Service: General;  Laterality: Right;  . PORTACATH PLACEMENT Left 08/17/2016   Procedure: INSERTION PORT-A-CATH WITH [place];  Surgeon: Rolm Bookbinder, MD;  Location: [place];  Service: General;  Laterality: Left;     OB History   No obstetric history on file.     Family History  Problem Relation Age of Onset  . COPD Father   . Cancer Father     Social History   Tobacco Use  . Smoking status: Current Every Day Smoker    Packs/day: 0.50    Years: 25.00    Pack years: 12.50    Types: Cigarettes  Start date: 04/19/1993  . Smokeless tobacco: Never Used  . Tobacco comment: she stopped smoking for a long time, but started back.   Vaping Use  . Vaping Use: Never used  Substance Use Topics  . Alcohol use: No  . Drug use: No    Home Medications Prior to Admission medications   Medication Sig Start Date End Date Taking? Authorizing Provider  levothyroxine (SYNTHROID) 150 MCG tablet Take 1 tablet (150 mcg total) by mouth daily before breakfast. 03/12/20   Sharion Balloon, FNP  tamoxifen (NOLVADEX) 20 MG tablet Take 0.5 tablets (10 mg total) by mouth daily. 02/24/20   Nicholas Lose, MD    Allergies    Ivp dye [iodinated diagnostic  agents], Other, Augmentin [amoxicillin-pot clavulanate], Dilaudid [hydromorphone hcl], Asa [aspirin], and Codeine  Review of Systems   Review of Systems  All other systems reviewed and are negative. Ten systems reviewed and are negative for acute change, except as noted in the HPI.   Physical Exam Updated Vital Signs BP (!) 160/79   Pulse 75   Temp 98.2 F (36.8 C) (Oral)   Resp 16   Ht 5\' 7"  (1.702 m)   Wt 83 kg   SpO2 96%   BMI 28.66 kg/m   Physical Exam Vitals and nursing note reviewed.  Constitutional:      General: She is not in acute distress.    Appearance: Normal appearance. She is not ill-appearing, toxic-appearing or diaphoretic.  HENT:     Head: Normocephalic and atraumatic.     Right Ear: External ear normal.     Left Ear: External ear normal.     Nose: Nose normal.     Mouth/Throat:     Mouth: Mucous membranes are moist.     Pharynx: Oropharynx is clear. No oropharyngeal exudate or posterior oropharyngeal erythema.  Eyes:     Extraocular Movements: Extraocular movements intact.  Cardiovascular:     Rate and Rhythm: Normal rate and regular rhythm.     Pulses: Normal pulses.     Heart sounds: Normal heart sounds. No murmur heard. No friction rub. No gallop.   Pulmonary:     Effort: Pulmonary effort is normal. No respiratory distress.     Breath sounds: Normal breath sounds. No stridor. No wheezing, rhonchi or rales.  Abdominal:     General: Abdomen is flat.     Tenderness: There is no abdominal tenderness.  Musculoskeletal:        General: Tenderness present. Normal range of motion.     Cervical back: Normal range of motion and neck supple. No tenderness.  Skin:    General: Skin is warm and dry.     Findings: Erythema present.     Comments: Small region of linear swelling noted behind the left knee and spanning up the left posterior thigh to about mid thigh.  Overlying erythema.  Exquisitely tender palpable cord noted.  Neurological:     General: No  focal deficit present.     Mental Status: She is alert and oriented to person, place, and time.  Psychiatric:        Mood and Affect: Mood normal.        Behavior: Behavior normal.    ED Results / Procedures / Treatments   Labs (all labs ordered are listed, but only abnormal results are displayed) Labs Reviewed  COMPREHENSIVE METABOLIC PANEL - Abnormal; Notable for the following components:      Result Value   Glucose, Bld 104 (*)  Calcium 8.8 (*)    All other components within normal limits  CBC WITH DIFFERENTIAL/PLATELET - Abnormal; Notable for the following components:   Platelets 141 (*)    All other components within normal limits    EKG None  Radiology US Venous Img Lower Unilateral Left  Result Date: 10/17/2020 CLINICAL DATA:  Left thigh pain, redness and swelling. EXAM: Left LOWER EXTREMITY VENOUS DOPPLER ULTRASOUND TECHNIQUE: Gray-scale sonography with graded compression, as well as color Doppler and duplex ultrasound were performed to evaluate the lower extremity deep venous systems from the level of the common femoral vein and including the common femoral, femoral, profunda femoral, popliteal and calf veins including the posterior tibial, peroneal and gastrocnemius veins when visible. The superficial great saphenous vein was also interrogated. Spectral Doppler was utilized to evaluate flow at rest and with distal augmentation maneuvers in the common femoral, femoral and popliteal veins. COMPARISON:  None. FINDINGS: Contralateral Common Femoral Vein: Respiratory phasicity is normal and symmetric with the symptomatic side. No evidence of thrombus. Normal compressibility. Common Femoral Vein: No evidence of thrombus. Normal compressibility, respiratory phasicity and response to augmentation. Saphenofemoral Junction: No evidence of thrombus. Normal compressibility and flow on color Doppler imaging. Profunda Femoral Vein: No evidence of thrombus. Normal compressibility and flow on  color Doppler imaging. Femoral Vein: No evidence of thrombus. Normal compressibility, respiratory phasicity and response to augmentation. Popliteal Vein: No evidence of thrombus. Normal compressibility, respiratory phasicity and response to augmentation. Calf Veins: No evidence of thrombus. Normal compressibility and flow on color Doppler imaging. Superficial Great Saphenous Vein: The vein is noncompressible and contains internal echoes. The vein is also dilated. Findings consistent with greater saphenous vein thrombophlebitis. Venous Reflux:  None. Other Findings:  None. IMPRESSION: No evidence of deep venous thrombosis. Superficial thrombophlebitis involving the greater saphenous vein. Electronically Signed   By: Marijo Sanes M.D.   On: 10/17/2020 12:54    Procedures Procedures   Medications Ordered in ED Medications - No data to display  ED Course  I have reviewed the triage vital signs and the nursing notes.  Pertinent labs & imaging results that were available during my care of the patient were reviewed by me and considered in my medical decision making (see chart for details).  Clinical Course as of 10/17/20 1446  Sun Oct 17, 2020  1310 US Venous Img Lower Unilateral Left IMPRESSION: No evidence of deep venous thrombosis.  Superficial thrombophlebitis involving the greater saphenous vein. [LJ]    Clinical Course User Index [LJ] Rayna Sexton, PA-C   MDM Rules/Calculators/A&P                          Pt is a 65 y.o. female who presents to the emergency department with what appears to be a superficial thrombophlebitis involving the greater saphenous vein.  Labs: CBC with platelets of 141.  Otherwise, no abnormalities. CMP with a glucose of 104 and a calcium of 8.8.  Imaging: Ultrasound showing no evidence of DVT.  There is a superficial thrombophlebitis involving the greater saphenous vein.  I, Rayna Sexton, PA-C, personally reviewed and evaluated these images and lab  results as part of my medical decision-making.  Feel that patient's symptoms today are likely secondary to her age, smoking history, as well as tamoxifen use.  Lab work is reassuring.  Patient has no chest pain or shortness of breath.  Recommended patient start taking 81 mg aspirin daily, ibuprofen 2-3x per day, apply heating pads,  elevate the leg, and follow-up with her regular doctor in 7 to 10 days for evaluation of progression/resolution.  We discussed return precautions at length.  Patient verbalized understanding the above plan.  Plan was also discussed with her husband at bedside as well as her son over the phone who are in agreement.  Feel that she is stable for discharge at this time and she is agreeable.  Her questions were answered and she was amicable at the time of discharge.  Note: Portions of this report may have been transcribed using voice recognition software. Every effort was made to ensure accuracy; however, inadvertent computerized transcription errors may be present.   Final Clinical Impression(s) / ED Diagnoses Final diagnoses:  Thrombophlebitis of superficial veins of left lower extremity   Rx / DC Orders ED Discharge Orders    None       Rayna Sexton, PA-C 10/17/20 [place], MD 10/20/20 [phone]

## 2020-10-27 ENCOUNTER — Other Ambulatory Visit: Payer: Self-pay

## 2020-10-27 ENCOUNTER — Ambulatory Visit (INDEPENDENT_AMBULATORY_CARE_PROVIDER_SITE_OTHER): Payer: Medicare HMO | Admitting: Family Medicine

## 2020-10-27 ENCOUNTER — Encounter: Payer: Self-pay | Admitting: Family Medicine

## 2020-10-27 VITALS — BP 132/88 | HR 82 | Temp 97.4°F | Ht 67.0 in | Wt 184.0 lb

## 2020-10-27 DIAGNOSIS — L409 Psoriasis, unspecified: Secondary | ICD-10-CM | POA: Diagnosis not present

## 2020-10-27 DIAGNOSIS — E039 Hypothyroidism, unspecified: Secondary | ICD-10-CM

## 2020-10-27 DIAGNOSIS — I8002 Phlebitis and thrombophlebitis of superficial vessels of left lower extremity: Secondary | ICD-10-CM

## 2020-10-27 MED ORDER — BETAMETHASONE VALERATE 0.1 % EX OINT: 1.0000 "application " | TOPICAL_OINTMENT | Freq: Two times a day (BID) | CUTANEOUS | 0 refills | Status: DC

## 2020-10-27 NOTE — Progress Notes (Signed)
Subjective: CC: Thrombophlebitis PCP: Cynthia Norlander, DO INO:Cynthia Chavez Cynthia Chavez is a 65 y.o. female presenting to clinic today for:  1.  Thrombophlebitis Patient was seen in the emergency department on 10/17/2020 for a left lower extremity thrombophlebitis involving the greater saphenous vein found on venous ultrasound.  She was placed on aspirin and oral NSAIDs and instructed to follow-up with PCP.  She is here today notes that the pain and swelling is improving but there is still a palpable cord along the left medial thigh.  She has been taking the medications as prescribed and wants to know if she would be a good candidate for resveratrol.  She was told that this is an ingredient in wine that can improve blood flow and reduce clotting.  She is an active every day smoker and admits that she does indeed need to stop smoking but has had trouble due to anxiety since her mastectomy.  2.  Hypothyroidism Patient is compliant with Synthroid.  No reports of tremor or heart palpitations.  3.  Rash Patient reports a scaly, itchy rash noted on the left lateral leg.  This occurs intervally.  She is asking for prescription help with this.  ROS: Per HPI  Allergies  Allergen Reactions  . Ivp Dye [Iodinated Diagnostic Agents] Other (See Comments)    Chest pain  . Other Shortness Of Breath, Swelling and Other (See Comments)    Versed or Fentanyl when used for endoscopy caused throat and tongue swelling  . Augmentin [Amoxicillin-Pot Clavulanate]   . Dilaudid [Hydromorphone Hcl]     PT had CP and felt shaky. Didn't like the feeling, but sx lessened w/in 10 min of IV administration  . Asa [Aspirin] Other (See Comments)    spasms  . Codeine Other (See Comments)    spasms   Past Medical History:  Diagnosis Date  . Anxiety   . Breast cancer ([place])   . Cancer [facility])    breast cancer  . Depression   . Edema    bilateral feet and leg swelling  . GERD (gastroesophageal reflux disease)   . History of  hiatal hernia   . History of radiation therapy 02/12/17- 03/26/17   Chest wall and regional nodes, right- 50 Gy in 25 fractions, Chest Wall, boost, right- 10 Gy in 5 fractions.   . Hypothyroidism   . Malaise   . Personal history of chemotherapy   . Personal history of radiation therapy   . Thyroid disease   . Varicose veins     Current Outpatient Medications:  .  levothyroxine (SYNTHROID) 150 MCG tablet, Take 1 tablet (150 mcg total) by mouth daily before breakfast., Disp: 90 tablet, Rfl: 1 .  tamoxifen (NOLVADEX) 20 MG tablet, Take 0.5 tablets (10 mg total) by mouth daily., Disp: 90 tablet, Rfl: 3 Social History   Socioeconomic History  . Marital status: Married    Spouse name: Not on file  . Number of children: 3  . Years of education: 65  . Highest education level: 11th grade  Occupational History  . Not on file  Tobacco Use  . Smoking status: Current Every Day Smoker    Packs/day: 0.50    Years: 25.00    Pack years: 12.50    Types: Cigarettes    Start date: 04/19/1993  . Smokeless tobacco: Never Used  . Tobacco comment: she stopped smoking for a long time, but started back.   Vaping Use  . Vaping Use: Never used  Substance and Sexual  Activity  . Alcohol use: No  . Drug use: No  . Sexual activity: Not on file  Other Topics Concern  . Not on file  Social History Narrative  . Not on file   Social Determinants of Health   Financial Resource Strain: Not on file  Food Insecurity: Not on file  Transportation Needs: Not on file  Physical Activity: Not on file  Stress: Not on file  Social Connections: Not on file  Intimate Partner Violence: Not on file   Family History  Problem Relation Age of Onset  . COPD Father   . Cancer Father     Objective: Office vital signs reviewed. BP 132/88   Pulse 82   Temp (!) 97.4 F (36.3 C) (Temporal)   Ht 5\' 7"  (1.702 m)   Wt 184 lb (83.5 kg)   SpO2 96%   BMI 28.82 kg/m   Physical Examination:  General: Awake, alert,  well nourished, No acute distress HEENT: Normal; no exophthalmos or goiter Cardio: regular rate   Pulm: normal work of breathing on room air Extremities: Left medial thigh with ongoing palpable cord.  There is minimal erythema and warmth noted along this area.  No significant tenderness to palpation Skin: Psoriatic plaque noted along the left lateral leg.  No petechiae.  No exudates  Assessment/ Plan: 65 y.o. female   Thrombophlebitis of left saphenous vein - Plan: Basic Metabolic Panel, CBC  Acquired hypothyroidism - Plan: TSH, T4, Free  Psoriasis - Plan: betamethasone valerate ointment (VALISONE) 0.1 %  Ongoing palpable cord noted in the left medial thigh.  I reviewed her emergency department note and imaging study results.  Check BMP, CBC given use of NSAIDs and aspirin.  Seemingly asymptomatic from a thyroid standpoint.  Check thyroid levels  Lesion on lower leg is consistent with psoriatic plaque.  Betamethasone ordered.  No orders of the defined types were placed in this encounter.  No orders of the defined types were placed in this encounter.    Cynthia Norlander, DO [place] [phone]

## 2020-10-27 NOTE — Patient Instructions (Signed)
Resveratrol--Resveratrol is a naturally occurring antioxidant, a non-flavonoid phenol, found in red wine, mulberries, peanuts, rhubarb, and grapes. A range of claims about its health benefits have been made, including prevention of ischemic heart disease and peripheral vascular disease, and improved tolerance of stress [85,86]. Animal studies report improved motor skills, higher maximum oxygen consumption, and increases in oxidative muscle fiber. Studies in humans are needed to determine whether ergogenic benefits accrue. There is no widely accepted dose.  I could not find any high powered studies to support use of resveratrol over baby aspirin to reduce clotting.  Stopping smoking is really the best thing you can do to prevent recurrence.    Thrombophlebitis Thrombophlebitis is a condition in which a blood clot forms in a vein. This can happen in your arms or legs, or in the area between your neck and groin (torso). When this condition happens in a vein that is close to the surface of the body (superficial thrombophlebitis), it is usually not serious.However, when the condition happens in a vein that is deep inside the body (deep vein thrombosis, DVT), it can cause serious problems. What are the causes? This condition may be caused by:  Damage to a vein.  Inflammation of the veins.  A condition that causes blood to clot more easily.  Reduced blood flow through the veins. What increases the risk? The following factors may make you more likely to develop this condition:  Having a condition that makes blood thicker or more likely to clot.  Having an infection.  Having major surgery.  Experiencing a traumatic injury or a broken bone.  Having a catheter in a vein (central line).  Having a condition in which valves in the veins do not work properly, causing blood to collect (pool) in the veins (chronic venous insufficiency).  An inactive (sedentary) lifestyle.  Pregnancy or having  recently given birth.  Cancer.  Older age, especially being 54 or older.  Obesity.  Smoking.  Taking medicines that contain estrogen, such as birth control pills.  Having varicose veins.  Using drugs that are injected into the veins (intravenous, IV). What are the signs or symptoms? The main symptoms of this condition are:  Swelling and pain in an arm or leg. If the affected vein is in the leg, you may feel pain while standing or walking.  Warmth or redness in an arm or leg. Other symptoms include:  Low-grade fever.  Muscle aches.  A bulging vein (venous distension). In some cases, there are no symptoms. How is this diagnosed? This condition may be diagnosed based on:  Your symptoms and medical history.  A physical exam.  Tests, such as: ? Blood tests. ? A test that uses sound waves to make images (ultrasound). How is this treated? Treatment depends on how severe the condition is and which area of the body is affected. Treatment may include:  Applying a warm compress or heating pad to affected areas.  Wearing compression stockings to help prevent blood clots and reduce swelling in your legs.  Raising (elevating) the affected arm or leg above the level of your heart.  Medicines, such as: ? Anti-inflammatory medicines, such as ibuprofen. ? Blood thinners (anticoagulants), such as heparin. ? Antibiotic medicine, if you have an infection.  Removing an IV that may be causing the problem. In rare cases, surgery may be needed to:  Remove a damaged section of a vein.  Place a filter in a large vein to catch blood clots before they reach the  lungs. Follow these instructions at home: Medicines  Take over-the-counter and prescription medicines only as told by your health care provider.  If you were prescribed an antibiotic, take it as told by your health care provider. Do not stop using the antibiotic even if you feel better. Managing pain, stiffness, and  swelling  If directed, put heat on the affected area as often as told by your health care provider. Use the heat source that your health care provider recommends, such as a moist heat pack or a heating pad. ? Place a towel between your skin and the heat source. ? Leave the heat on for 20-30 minutes. ? Remove the heat if your skin turns bright red. This is especially important if you are not able to feel pain, heat, or cold. You may have a greater risk of getting burned.  Elevate the affected area above the level of your heart while you are sitting or lying down.   Activity  Return to your normal activities as told by your health care provider. Ask your health care provider what activities are safe for you.  Avoid sitting or lying down for long periods. If possible, stand up and walk around regularly. If you are taking blood thinners:  Take your medicine exactly as told, at the same time every day.  Avoid activities that could cause injury or bruising, and follow instructions about how to prevent falls.  Wear a medical alert bracelet or carry a card that lists what medicines you take. General instructions  Drink enough fluid to keep your urine pale yellow.  Wear compression stockings as told by your health care provider.  Do not use any products that contain nicotine or tobacco, such as cigarettes and e-cigarettes. If you need help quitting, ask your health care provider.  Keep all follow-up visits as told by your health care provider. This is important. Contact a health care provider if:  You miss a dose of your blood thinner, if applicable.  Your symptoms do not improve.  You have unusual bruising.  You have nausea, vomiting, or diarrhea that lasts for more than one day. Get help right away if:  You have any of these problems: ? New or worse pain, swelling, or redness in an arm or leg. ? Numbness or tingling in an arm or leg. ? Shortness of breath. ? Chest pain. ? Severe  pain in your abdomen. ? Fast breathing. ? A fast or irregular heartbeat. ? Blood in your vomit, stool, or urine. ? A severe headache or confusion. ? A cut that does not stop bleeding.  You feel light-headed or dizzy.  You cough up blood.  You have a serious fall or accident, or you hit your head. These symptoms may represent a serious problem that is an emergency. Do not wait to see if the symptoms will go away. Get medical help right away. Call your local emergency services (911 in the [place] Do not drive yourself to the hospital. Summary  Thrombophlebitis is a condition in which a blood clot forms in a vein. This can happen in a vein close to the surface of the body or a vein deep inside the body.  This condition can cause serious problems when it happens in a vein deep inside the body (deep vein thrombosis, DVT).  The main symptom of this condition is swelling and pain around the affected vein.  Treatment may include warm compresses, anti-inflammatory medicines, or blood thinners. This information is not intended to replace  advice given to you by your health care provider. Make sure you discuss any questions you have with your health care provider. Document Revised: 12/15/2019 Document Reviewed: 12/15/2019 Elsevier Patient Education  [place].

## 2020-10-28 LAB — CBC
Hematocrit: 37.6 % (ref 34.0–46.6)
Hemoglobin: 12.5 g/dL (ref 11.1–15.9)
MCH: 32.2 pg (ref 26.6–33.0)
MCHC: 33.2 g/dL (ref 31.5–35.7)
MCV: 97 fL (ref 79–97)
Platelets: 178 10*3/uL (ref 150–450)
RBC: 3.88 x10E6/uL (ref 3.77–5.28)
RDW: 11.9 % (ref 11.7–15.4)
WBC: 8.2 10*3/uL (ref 3.4–10.8)

## 2020-10-28 LAB — BASIC METABOLIC PANEL
BUN/Creatinine Ratio: 18 (ref 12–28)
BUN: 17 mg/dL (ref 8–27)
CO2: 24 mmol/L (ref 20–29)
Calcium: 8.8 mg/dL (ref 8.7–10.3)
Chloride: 107 mmol/L — ABNORMAL HIGH (ref 96–106)
Creatinine, Ser: 0.92 mg/dL (ref 0.57–1.00)
Glucose: 103 mg/dL — ABNORMAL HIGH (ref 65–99)
Potassium: 4 mmol/L (ref 3.5–5.2)
Sodium: 143 mmol/L (ref 134–144)
eGFR: 68 mL/min/{1.73_m2} (ref >59)

## 2020-10-28 LAB — T4, FREE: Free T4: 1.5 ng/dL (ref 0.82–1.77)

## 2020-10-28 LAB — TSH: TSH: 0.588 u[IU]/mL (ref 0.450–4.500)

## 2020-11-23 ENCOUNTER — Other Ambulatory Visit: Payer: Self-pay

## 2020-11-23 ENCOUNTER — Ambulatory Visit (INDEPENDENT_AMBULATORY_CARE_PROVIDER_SITE_OTHER): Payer: Medicare HMO | Admitting: Nurse Practitioner

## 2020-11-23 VITALS — BP 114/73 | HR 80 | Temp 97.1°F | Ht 67.0 in | Wt 178.0 lb

## 2020-11-23 DIAGNOSIS — S43401A Unspecified sprain of right shoulder joint, initial encounter: Secondary | ICD-10-CM | POA: Diagnosis not present

## 2020-11-23 HISTORY — DX: Unspecified sprain of right shoulder joint, initial encounter: S43.401A

## 2020-11-23 MED ORDER — ACETAMINOPHEN 500 MG PO TABS: 500.0000 mg | ORAL_TABLET | Freq: Four times a day (QID) | ORAL | 0 refills | Status: DC | PRN

## 2020-11-23 MED ORDER — METHOCARBAMOL 500 MG PO TABS: 500.0000 mg | ORAL_TABLET | Freq: Four times a day (QID) | ORAL | 0 refills | Status: DC

## 2020-11-23 NOTE — Progress Notes (Signed)
Acute Office Visit  Subjective:    Patient ID: Cynthia Chavez, female    DOB: 24-Jan-1956, 65 y.o.   MRN: [id number]  Chief Complaint  Patient presents with  . Muscle Pain    HPI Patient is in today for Pain  She reports new onset right shoulder/eight side pain. was an injury that may have caused the pain.  Patient was trying to start her lawnmower and pulled too hard on the handle. the pain started yesterday and is staying constant. The pain does not radiate . The pain is described as aching, is moderate in intensity, occurring constantly. Symptoms are worse in the: all day  Aggravating factors: bending backwards, bending forwards and bending sideways Relieving factors: none.  She has tried application of heat with little relief.   ---------------------------------------------------------------------------------------------------   Past Medical History:  Diagnosis Date  . Anxiety   . Breast cancer ([place])   . Cancer [facility])    breast cancer  . Depression   . Edema    bilateral feet and leg swelling  . GERD (gastroesophageal reflux disease)   . History of hiatal hernia   . History of radiation therapy 02/12/17- 03/26/17   Chest wall and regional nodes, right- 50 Gy in 25 fractions, Chest Wall, boost, right- 10 Gy in 5 fractions.   . Hypothyroidism   . Malaise   . Personal history of chemotherapy   . Personal history of radiation therapy   . Thyroid disease   . Varicose veins     Past Surgical History:  Procedure Laterality Date  . ABDOMINAL HYSTERECTOMY    . AXILLARY LYMPH NODE BIOPSY Right 01/03/2017   Procedure: RIGHT AXILLARY LYMPH NODE SEED GUIDED EXCISIONAL BIOPSY;  Surgeon: Rolm Bookbinder, MD;  Location: [place];  Service: General;  Laterality: Right;  . BREAST SURGERY    . CHOLECYSTECTOMY    . COLONOSCOPY    . ESOPHAGEAL DILATION    . MASTECTOMY Right   . MASTECTOMY W/ SENTINEL NODE BIOPSY Right 01/03/2017   axillary  . MASTECTOMY W/ SENTINEL NODE BIOPSY Right  01/03/2017   Procedure: RIGHT TOTAL MASTECTOMY WITH RIGHT AXILLARY SENTINEL LYMPH NODE BIOPSY;  Surgeon: Rolm Bookbinder, MD;  Location: [place];  Service: General;  Laterality: Right;  . PORTACATH PLACEMENT Left 08/17/2016   Procedure: INSERTION PORT-A-CATH WITH [place];  Surgeon: Rolm Bookbinder, MD;  Location: [place];  Service: General;  Laterality: Left;    Family History  Problem Relation Age of Onset  . COPD Father   . Cancer Father     Social History   Socioeconomic History  . Marital status: Married    Spouse name: Not on file  . Number of children: 3  . Years of education: 58  . Highest education level: 11th grade  Occupational History  . Not on file  Tobacco Use  . Smoking status: Current Every Day Smoker    Packs/day: 0.50    Years: 25.00    Pack years: 12.50    Types: Cigarettes    Start date: 04/19/1993  . Smokeless tobacco: Never Used  . Tobacco comment: she stopped smoking for a long time, but started back.   Vaping Use  . Vaping Use: Never used  Substance and Sexual Activity  . Alcohol use: No  . Drug use: No  . Sexual activity: Not on file  Other Topics Concern  . Not on file  Social History Narrative  . Not on file   Social Determinants of Health  Financial Resource Strain: Not on file  Food Insecurity: Not on file  Transportation Needs: Not on file  Physical Activity: Not on file  Stress: Not on file  Social Connections: Not on file  Intimate Partner Violence: Not on file    Outpatient Medications Prior to Visit  Medication Sig Dispense Refill  . betamethasone valerate ointment (VALISONE) 0.1 % Apply 1 application topically 2 (two) times daily. x10-14days per psoriatic flare 90 g 0  . levothyroxine (SYNTHROID) 150 MCG tablet Take 1 tablet (150 mcg total) by mouth daily before breakfast. 90 tablet 1  . tamoxifen (NOLVADEX) 20 MG tablet Take 0.5 tablets (10 mg total) by mouth daily. 90 tablet 3   No facility-administered  medications prior to visit.    Allergies  Allergen Reactions  . Ivp Dye [Iodinated Diagnostic Agents] Other (See Comments)    Chest pain  . Other Shortness Of Breath, Swelling and Other (See Comments)    Versed or Fentanyl when used for endoscopy caused throat and tongue swelling  . Augmentin [Amoxicillin-Pot Clavulanate]   . Dilaudid [Hydromorphone Hcl]     PT had CP and felt shaky. Didn't like the feeling, but sx lessened w/in 10 min of IV administration  . Asa [Aspirin] Other (See Comments)    spasms  . Codeine Other (See Comments)    spasms    Review of Systems  Constitutional: Negative.   HENT: Negative.   Respiratory: Negative.   Cardiovascular: Negative.   Genitourinary: Negative.   Musculoskeletal: Positive for myalgias.  Neurological: Negative.   All other systems reviewed and are negative.      Objective:    Physical Exam Vitals reviewed.  Constitutional:      Appearance: Normal appearance.  HENT:     Head: Normocephalic.     Nose: Nose normal.  Eyes:     Conjunctiva/sclera: Conjunctivae normal.  Cardiovascular:     Rate and Rhythm: Normal rate and regular rhythm.     Pulses: Normal pulses.     Heart sounds: Normal heart sounds.  Pulmonary:     Effort: Pulmonary effort is normal.     Breath sounds: Normal breath sounds.  Abdominal:     General: Bowel sounds are normal.  Musculoskeletal:        General: Tenderness present.       Arms:     Comments: Soreness/pain  Skin:    General: Skin is warm.  Neurological:     Mental Status: She is alert and oriented to person, place, and time.     BP 114/73   Pulse 80   Temp (!) 97.1 F (36.2 C) (Temporal)   Ht _0  (1.702 m)   Wt 178 lb (80.7 kg)   SpO2 95%   BMI 27.88 kg/m  Wt Readings from Last 3 Encounters:  11/23/20 178 lb (80.7 kg)  10/27/20 184 lb (83.5 kg)  10/17/20 183 lb (83 kg)    Health Maintenance Due  Topic Date Due  . COVID-19 Vaccine (1) Never done  . COLONOSCOPY (Pts  45-54yr Insurance coverage will need to be confirmed)  Never done  . DEXA SCAN  Never done  . PNA vac Low Risk Adult (1 of 2 - PCV13) Never done    There are no preventive care reminders to display for this patient.   Lab Results  Component Value Date   TSH 0.588 10/27/2020   Lab Results  Component Value Date   WBC 8.2 10/27/2020   HGB 12.5 10/27/2020  HCT 37.6 10/27/2020   MCV 97 10/27/2020   PLT 178 10/27/2020   Lab Results  Component Value Date   NA 143 10/27/2020   K 4.0 10/27/2020   CHLORIDE 109 07/20/2017   CO2 24 10/27/2020   GLUCOSE 103 (H) 10/27/2020   BUN 17 10/27/2020   CREATININE 0.92 10/27/2020   BILITOT 0.6 10/17/2020   ALKPHOS 62 10/17/2020   AST 18 10/17/2020   ALT 16 10/17/2020   PROT 7.1 10/17/2020   ALBUMIN 3.8 10/17/2020   CALCIUM 8.8 10/27/2020   ANIONGAP 10 10/17/2020   EGFR >60 07/20/2017   Lab Results  Component Value Date   CHOL 215 (H) 10/06/2013   Lab Results  Component Value Date   HDL 42 10/06/2013   Lab Results  Component Value Date   LDLCALC 152 (H) 10/06/2013   Lab Results  Component Value Date   TRIG 104 10/06/2013   Lab Results  Component Value Date   CHOLHDL 5.1 (H) 10/06/2013   No results found for: HGBA1C     Assessment & Plan:   Problem List Items Addressed This Visit      Musculoskeletal and Integument   Sprain of shoulder, right - Primary    Pain not well controlled.  Encourage patient to use Tylenol, Robaxin and ice or warm compress as tolerated, rest arm and follow-up with worsening unresolved symptoms.      Relevant Medications   methocarbamol (ROBAXIN) 500 MG tablet   acetaminophen (TYLENOL) 500 MG tablet       Meds ordered this encounter  Medications  . methocarbamol (ROBAXIN) 500 MG tablet    Sig: Take 1 tablet (500 mg total) by mouth 4 (four) times daily.    Dispense:  20 tablet    Refill:  0    Order Specific Question:   Supervising Provider    Answer:   Janora Norlander [[id number]]   . acetaminophen (TYLENOL) 500 MG tablet    Sig: Take 1 tablet (500 mg total) by mouth every 6 (six) hours as needed.    Dispense:  30 tablet    Refill:  0    Order Specific Question:   Supervising Provider    Answer:   Janora Norlander [[id number]]     Ivy Lynn, NP

## 2020-11-23 NOTE — Patient Instructions (Signed)
Shoulder Pain Many things can cause shoulder pain, including:  An injury.  Moving the shoulder in the same way again and again (overuse).  Joint pain (arthritis). Pain can come from:  Swelling and irritation (inflammation) of any part of the shoulder.  An injury to the shoulder joint.  An injury to: ? Tissues that connect muscle to bone (tendons). ? Tissues that connect bones to each other (ligaments). ? Bones. Follow these instructions at home: Watch for changes in your symptoms. Let your doctor know about them. Follow these instructions to help with your pain. If you have a sling:  Wear the sling as told by your doctor. Remove it only as told by your doctor.  Loosen the sling if your fingers: ? Tingle. ? Become numb. ? Turn cold and blue.  Keep the sling clean.  If the sling is not waterproof: ? Do not let it get wet. ? Take the sling off when you shower or bathe. Managing pain, stiffness, and swelling  If told, put ice on the painful area: ? Put ice in a plastic bag. ? Place a towel between your skin and the bag. ? Leave the ice on for 20 minutes, 2-3 times a day. Stop putting ice on if it does not help with the pain.  Squeeze a soft ball or a foam pad as much as possible. This prevents swelling in the shoulder. It also helps to strengthen the arm.   General instructions  Take over-the-counter and prescription medicines only as told by your doctor.  Keep all follow-up visits as told by your doctor. This is important. Contact a doctor if:  Your pain gets worse.  Medicine does not help your pain.  You have new pain in your arm, hand, or fingers. Get help right away if:  Your arm, hand, or fingers: ? Tingle. ? Are numb. ? Are swollen. ? Are painful. ? Turn white or blue. Summary  Shoulder pain can be caused by many things. These include injury, moving the shoulder in the same away again and again, and joint pain.  Watch for changes in your symptoms.  Let your doctor know about them.  This condition may be treated with a sling, ice, and pain medicine.  Contact your doctor if the pain gets worse or you have new pain. Get help right away if your arm, hand, or fingers tingle or get numb, swollen, or painful.  Keep all follow-up visits as told by your doctor. This is important. This information is not intended to replace advice given to you by your health care provider. Make sure you discuss any questions you have with your health care provider. Document Revised: 02/05/2018 Document Reviewed: 02/05/2018 Elsevier Patient Education  2021 Elsevier Inc.  

## 2020-11-23 NOTE — Assessment & Plan Note (Signed)
Pain not well controlled.  Encourage patient to use Tylenol, Robaxin and ice or warm compress as tolerated, rest arm and follow-up with worsening unresolved symptoms.

## 2020-12-08 ENCOUNTER — Other Ambulatory Visit: Payer: Self-pay | Admitting: Family

## 2020-12-08 DIAGNOSIS — E039 Hypothyroidism, unspecified: Secondary | ICD-10-CM

## 2021-02-11 ENCOUNTER — Ambulatory Visit (INDEPENDENT_AMBULATORY_CARE_PROVIDER_SITE_OTHER): Payer: Medicare HMO | Admitting: Nurse Practitioner

## 2021-02-11 ENCOUNTER — Encounter: Payer: Self-pay | Admitting: Nurse Practitioner

## 2021-02-11 DIAGNOSIS — R0981 Nasal congestion: Secondary | ICD-10-CM | POA: Diagnosis not present

## 2021-02-11 MED ORDER — CHLORPHEN-PE-ACETAMINOPHEN 4-10-325 MG PO TABS: 1.0000 | ORAL_TABLET | Freq: Four times a day (QID) | ORAL | 0 refills | Status: DC | PRN

## 2021-02-11 MED ORDER — TRIAMCINOLONE ACETONIDE 55 MCG/ACT NA AERO: 2.0000 | INHALATION_SPRAY | Freq: Every day | NASAL | 12 refills | Status: DC

## 2021-02-11 NOTE — Progress Notes (Signed)
Virtual Visit  Note Due to COVID-19 pandemic this visit was conducted virtually. This visit type was conducted due to national recommendations for restrictions regarding the COVID-19 Pandemic (e.g. social distancing, sheltering in place) in an effort to limit this patient's exposure and mitigate transmission in our community. All issues noted in this document were discussed and addressed.  A physical exam was not performed with this format.  I connected with Cynthia Chavez on 02/11/21 at 8:04 by telephone and verified that I am speaking with the correct person using two identifiers. Cynthia Chavez is currently located at store and her husband is currently with her during visit. The provider, Mary-Margaret Hassell Done, FNP is located in their office at time of visit.  I discussed the limitations, risks, security and privacy concerns of performing an evaluation and management service by telephone and the availability of in person appointments. I also discussed with the patient that there may be a patient responsible charge related to this service. The patient expressed understanding and agreed to proceed.   History and Present Illness:   Chief Complaint: sinusitis  HPI Patient has been working in her garden. She says this has irritated her sinuses. She feels very congested. No fever, cough or congestion.    Review of Systems  Constitutional:  Positive for malaise/fatigue. Negative for chills and fever.  HENT:  Positive for congestion. Negative for sinus pain and sore throat.   Respiratory:  Negative for cough, sputum production and shortness of breath.   Neurological:  Negative for dizziness and headaches.  All other systems reviewed and are negative.   Observations/Objective: Alert and oriented- answers all questions appropriately No distress Voice hoarse No cough  Assessment and Plan: Cynthia Chavez in today with chief complaint of Sinusitis   1. Congestion of nasal sinus 1. Take  meds as prescribed 2. Use a cool mist humidifier especially during the winter months and when heat has been humid. 3. Use saline nose sprays frequently 4. Saline irrigations of the nose can be very helpful if done frequently.  * 4X daily for 1 week*  * Use of a nettie pot can be helpful with this. Follow directions with this* 5. Drink plenty of fluids 6. Keep thermostat turn down low 7.For any cough or congestion  Use plain Mucinex- regular strength or max strength is fine   * Children- consult with Pharmacist for dosing 8. For fever or aces or pains- take tylenol or ibuprofen appropriate for age and weight.  * for fevers greater than 101 orally you may alternate ibuprofen and tylenol every  3 hours.    - triamcinolone (NASACORT) 55 MCG/ACT AERO nasal inhaler; Place 2 sprays into the nose daily.  Dispense: 1 each; Refill: 12 - Chlorphen-PE-Acetaminophen 4-10-325 MG TABS; Take 1 tablet by mouth every 6 (six) hours as needed.  Dispense: 20 tablet; Refill: 0     Follow Up Instructions: prn    I discussed the assessment and treatment plan with the patient. The patient was provided an opportunity to ask questions and all were answered. The patient agreed with the plan and demonstrated an understanding of the instructions.   The patient was advised to call back or seek an in-person evaluation if the symptoms worsen or if the condition fails to improve as anticipated.  The above assessment and management plan was discussed with the patient. The patient verbalized understanding of and has agreed to the management plan. Patient is aware to call the clinic if symptoms persist  or worsen. Patient is aware when to return to the clinic for a follow-up visit. Patient educated on when it is appropriate to go to the emergency department.   Time call ended:  8:16  I provided 12 minutes of  non face-to-face time during this encounter.    Mary-Margaret Hassell Done, FNP

## 2021-02-12 DIAGNOSIS — J321 Chronic frontal sinusitis: Secondary | ICD-10-CM | POA: Diagnosis not present

## 2021-04-13 ENCOUNTER — Other Ambulatory Visit: Payer: Self-pay | Admitting: Hematology and Oncology

## 2021-05-03 ENCOUNTER — Other Ambulatory Visit: Payer: Self-pay

## 2021-05-03 ENCOUNTER — Encounter: Payer: Self-pay | Admitting: Family Medicine

## 2021-05-03 ENCOUNTER — Ambulatory Visit (INDEPENDENT_AMBULATORY_CARE_PROVIDER_SITE_OTHER): Payer: Medicare HMO | Admitting: Family Medicine

## 2021-05-03 VITALS — BP 127/74 | HR 83 | Temp 97.4°F | Ht 67.0 in | Wt 174.2 lb

## 2021-05-03 DIAGNOSIS — C50411 Malignant neoplasm of upper-outer quadrant of right female breast: Secondary | ICD-10-CM | POA: Diagnosis not present

## 2021-05-03 DIAGNOSIS — Z170 Estrogen receptor positive status [ER+]: Secondary | ICD-10-CM

## 2021-05-03 DIAGNOSIS — R490 Dysphonia: Secondary | ICD-10-CM

## 2021-05-03 DIAGNOSIS — E559 Vitamin D deficiency, unspecified: Secondary | ICD-10-CM

## 2021-05-03 DIAGNOSIS — E7800 Pure hypercholesterolemia, unspecified: Secondary | ICD-10-CM

## 2021-05-03 DIAGNOSIS — E039 Hypothyroidism, unspecified: Secondary | ICD-10-CM | POA: Diagnosis not present

## 2021-05-03 NOTE — Patient Instructions (Signed)
Would like cholesterol, vit d checked.

## 2021-05-03 NOTE — Progress Notes (Signed)
Subjective: CC: Hypothyroidism PCP: Cynthia Norlander, DO QIH:Cynthia Chavez is a 65 y.o. female presenting to clinic today for:  1.  Acquired hypothyroidism No history of surgery or radiation to the neck.  Does have history of goiter.  Notes that she has been having some hoarseness that is been getting worse as of late.  Initially she thought this was due to exposure to allergens when she moves but symptoms have not gone away.  Denies any problems swallowing, throat pain.  She is an active every day smoker and has a history of breast cancer, for which she is treated with tamoxifen by her oncologist.  She has an upcoming appointment for breast examination and labs with them.  She denies any tremor, heart palpitations, change in bowel habits or energy.  She is compliant with her Synthroid  2.  Hyperlipidemia, vit d def Not currently treated for hyperlipidemia.  Nonfasting.  Would like to hold off on any lipid or vitamin D labs until she has labs drawn with her specialist.  Is also going to hold off on DEXA scan   ROS: Per HPI  Allergies  Allergen Reactions   Ivp Dye [Iodinated Diagnostic Agents] Other (See Comments)    Chest pain   Other Shortness Of Breath, Swelling and Other (See Comments)    Versed or Fentanyl when used for endoscopy caused throat and tongue swelling   Augmentin [Amoxicillin-Pot Clavulanate]    Dilaudid [Hydromorphone Hcl]     PT had CP and felt shaky. Didn't like the feeling, but sx lessened w/in 10 min of IV administration   Asa [Aspirin] Other (See Comments)    spasms   Codeine Other (See Comments)    spasms   Past Medical History:  Diagnosis Date   Anxiety    Breast cancer ([place])    Cancer ([place])    breast cancer   Depression    Edema    bilateral feet and leg swelling   GERD (gastroesophageal reflux disease)    History of hiatal hernia    History of radiation therapy 02/12/17- 03/26/17   Chest wall and regional nodes, right- 50 Gy in 25 fractions, Chest  Wall, boost, right- 10 Gy in 5 fractions.    Hypothyroidism    Malaise    Personal history of chemotherapy    Personal history of radiation therapy    Sprain of shoulder, right 11/23/2020   Thyroid disease    Varicose veins     Current Outpatient Medications:    acetaminophen (TYLENOL) 500 MG tablet, Take 1 tablet (500 mg total) by mouth every 6 (six) hours as needed., Disp: 30 tablet, Rfl: 0   betamethasone valerate ointment (VALISONE) 0.1 %, Apply 1 application topically 2 (two) times daily. x10-14days per psoriatic flare, Disp: 90 g, Rfl: 0   Chlorphen-PE-Acetaminophen 4-10-325 MG TABS, Take 1 tablet by mouth every 6 (six) hours as needed., Disp: 20 tablet, Rfl: 0   levothyroxine (SYNTHROID) 150 MCG tablet, TAKE 1 TABLET (150 MCG TOTAL) BY MOUTH DAILY BEFORE BREAKFAST., Disp: 90 tablet, Rfl: 3   methocarbamol (ROBAXIN) 500 MG tablet, Take 1 tablet (500 mg total) by mouth 4 (four) times daily., Disp: 20 tablet, Rfl: 0   tamoxifen (NOLVADEX) 20 MG tablet, TAKE 1/2 TABLET EVERY DAY, Disp: 45 tablet, Rfl: 1   triamcinolone (NASACORT) 55 MCG/ACT AERO nasal inhaler, Place 2 sprays into the nose daily., Disp: 1 each, Rfl: 12 Social History   Socioeconomic History   Marital status: Married  Spouse name: Not on file   Number of children: 3   Years of education: 52   Highest education level: 11th grade  Occupational History   Not on file  Tobacco Use   Smoking status: Every Day    Packs/day: 0.50    Years: 25.00    Pack years: 12.50    Types: Cigarettes    Start date: 04/19/1993   Smokeless tobacco: Never   Tobacco comments:    she stopped smoking for a long time, but started back.   Vaping Use   Vaping Use: Never used  Substance and Sexual Activity   Alcohol use: No   Drug use: No   Sexual activity: Not on file  Other Topics Concern   Not on file  Social History Narrative   Not on file   Social Determinants of Health   Financial Resource Strain: Not on file  Food  Insecurity: Not on file  Transportation Needs: Not on file  Physical Activity: Not on file  Stress: Not on file  Social Connections: Not on file  Intimate Partner Violence: Not on file   Family History  Problem Relation Age of Onset   COPD Father    Cancer Father     Objective: Office vital signs reviewed. BP 127/74   Pulse 83   Temp (!) 97.4 F (36.3 C)   Ht '5\' 7"'  (1.702 m)   Wt 174 lb 3.2 oz (79 kg)   SpO2 98%   BMI 27.28 kg/m   Physical Examination:  General: Awake, alert, chronically ill-appearing female, No acute distress HEENT: Normal; oropharynx without masses. Cardio: regular rate and rhythm, S1S2 heard, no murmurs appreciated Pulm: Globally decreased breath sounds.  Normal work of breathing on room air.  Intermittent coughing   Assessment/ Plan: 64 y.o. female   Acquired hypothyroidism - Plan: TSH, T4, Free, US THYROID, CANCELED: DG [facility] DEXA, CANCELED: CMP14+EGFR  Hoarseness of voice - Plan: US THYROID  Malignant neoplasm of upper-outer quadrant of right breast in female, estrogen receptor positive ([place]) - Plan: CANCELED: DG [facility] DEXA, CANCELED: CBC  Pure hypercholesterolemia - Plan: CANCELED: Lipid Panel  Vitamin D deficiency - Plan: CANCELED: [facility] DEXA, CANCELED: VITAMIN D 25 Hydroxy (Vit-D Deficiency, Fractures)  Check TSH, free T4 thyroid ultrasound also ordered given reports of change in voice  Continue to follow-up with oncology for breast cancer.  Reinforced need for recheck of DEXA scan, vitamin D level and lipid panel.  She wished that these be deferred  Orders Placed This Encounter  Procedures   TSH   T4, Free   No orders of the defined types were placed in this encounter.    Cynthia Norlander, DO [place] [phone]

## 2021-05-04 LAB — TSH: TSH: 0.453 u[IU]/mL (ref 0.450–4.500)

## 2021-05-04 LAB — T4, FREE: Free T4: 1.66 ng/dL (ref 0.82–1.77)

## 2021-05-10 ENCOUNTER — Other Ambulatory Visit: Payer: Self-pay | Admitting: Family Medicine

## 2021-05-10 ENCOUNTER — Other Ambulatory Visit: Payer: Self-pay

## 2021-05-10 ENCOUNTER — Ambulatory Visit (HOSPITAL_COMMUNITY)
Admission: RE | Admit: 2021-05-10 | Discharge: 2021-05-10 | Disposition: A | Discharge status: Home / Self Care | Payer: Medicare HMO | Source: Ambulatory Visit | Attending: Family Medicine | Admitting: Family Medicine

## 2021-05-10 DIAGNOSIS — R490 Dysphonia: Secondary | ICD-10-CM | POA: Diagnosis not present

## 2021-05-10 DIAGNOSIS — F17200 Nicotine dependence, unspecified, uncomplicated: Secondary | ICD-10-CM

## 2021-05-10 DIAGNOSIS — E034 Atrophy of thyroid (acquired): Secondary | ICD-10-CM | POA: Diagnosis not present

## 2021-05-10 DIAGNOSIS — E039 Hypothyroidism, unspecified: Secondary | ICD-10-CM

## 2021-05-11 ENCOUNTER — Ambulatory Visit (HOSPITAL_COMMUNITY): Payer: Medicare HMO

## 2021-05-13 ENCOUNTER — Ambulatory Visit (INDEPENDENT_AMBULATORY_CARE_PROVIDER_SITE_OTHER): Payer: Medicare HMO

## 2021-05-13 VITALS — Ht 67.0 in | Wt 174.0 lb

## 2021-05-13 DIAGNOSIS — Z1211 Encounter for screening for malignant neoplasm of colon: Secondary | ICD-10-CM

## 2021-05-13 DIAGNOSIS — Z0000 Encounter for general adult medical examination without abnormal findings: Secondary | ICD-10-CM | POA: Diagnosis not present

## 2021-05-13 NOTE — Progress Notes (Signed)
Subjective:   Cynthia Chavez is a 65 y.o. female who presents for Medicare Annual (Subsequent) preventive examination.  Virtual Visit via Telephone Note  I connected with  Cynthia Chavez on 05/13/21 at 10:30 AM EDT by telephone and verified that I am speaking with the correct person using two identifiers.  Location: Patient: Home Provider: [facility] Persons participating in the virtual visit: patient/Nurse Health Advisor   I discussed the limitations, risks, security and privacy concerns of performing an evaluation and management service by telephone and the availability of in person appointments. The patient expressed understanding and agreed to proceed.  Interactive audio and video telecommunications were attempted between this nurse and patient, however failed, due to patient having technical difficulties OR patient did not have access to video capability.  We continued and completed visit with audio only.  Some vital signs may be absent or patient reported.   Marry Kusch E Sha Amer, LPN   Review of Systems     Cardiac Risk Factors include: advanced age (>8men, >9 women);sedentary lifestyle;smoking/ tobacco exposure;Other (see comment), Risk factor comments: hx of breast cancer and chemotherapy     Objective:    Today's Vitals   05/13/21 1036  Weight: 174 lb (78.9 kg)  Height: 5\' 7"  (1.702 m)   Body mass index is 27.25 kg/m.  Advanced Directives 05/13/2021 10/17/2020 08/11/2019 09/19/2017 05/11/2017 04/06/2017 02/23/2017  Does Patient Have a Medical Advance Directive? No No No No No No No  Would patient like information on creating a medical advance directive? No - Patient declined No - Patient declined Yes (MAU/Ambulatory/Procedural Areas - Information given) No - Patient declined No - Patient declined - -    Current Medications (verified) Outpatient Encounter Medications as of 05/13/2021  Medication Sig   acetaminophen (TYLENOL) 500 MG tablet Take 1 tablet (500 mg total) by mouth every 6  (six) hours as needed.   betamethasone valerate ointment (VALISONE) 0.1 % Apply 1 application topically 2 (two) times daily. x10-14days per psoriatic flare   Chlorphen-PE-Acetaminophen 4-10-325 MG TABS Take 1 tablet by mouth every 6 (six) hours as needed.   levothyroxine (SYNTHROID) 150 MCG tablet TAKE 1 TABLET (150 MCG TOTAL) BY MOUTH DAILY BEFORE BREAKFAST.   methocarbamol (ROBAXIN) 500 MG tablet Take 1 tablet (500 mg total) by mouth 4 (four) times daily.   tamoxifen (NOLVADEX) 20 MG tablet TAKE 1/2 TABLET EVERY DAY   triamcinolone (NASACORT) 55 MCG/ACT AERO nasal inhaler Place 2 sprays into the nose daily.   No facility-administered encounter medications on file as of 05/13/2021.    Allergies (verified) Ivp dye [iodinated diagnostic agents], Other, Augmentin [amoxicillin-pot clavulanate], Dilaudid [hydromorphone hcl], Asa [aspirin], and Codeine   History: Past Medical History:  Diagnosis Date   Anxiety    Breast cancer ([place])    Cancer ([place])    breast cancer   Depression    Edema    bilateral feet and leg swelling   GERD (gastroesophageal reflux disease)    History of hiatal hernia    History of radiation therapy 02/12/17- 03/26/17   Chest wall and regional nodes, right- 50 Gy in 25 fractions, Chest Wall, boost, right- 10 Gy in 5 fractions.    Hypothyroidism    Malaise    Personal history of chemotherapy    Personal history of radiation therapy    Sprain of shoulder, right 11/23/2020   Thyroid disease    Varicose veins    Past Surgical History:  Procedure Laterality Date   ABDOMINAL HYSTERECTOMY  AXILLARY LYMPH NODE BIOPSY Right 01/03/2017   Procedure: RIGHT AXILLARY LYMPH NODE SEED GUIDED EXCISIONAL BIOPSY;  Surgeon: Rolm Bookbinder, MD;  Location: [facility];  Service: General;  Laterality: Right;   BREAST SURGERY     CHOLECYSTECTOMY     COLONOSCOPY     ESOPHAGEAL DILATION     MASTECTOMY Right    MASTECTOMY W/ SENTINEL NODE BIOPSY Right 01/03/2017   axillary    MASTECTOMY W/ SENTINEL NODE BIOPSY Right 01/03/2017   Procedure: RIGHT TOTAL MASTECTOMY WITH RIGHT AXILLARY SENTINEL LYMPH NODE BIOPSY;  Surgeon: Rolm Bookbinder, MD;  Location: [facility];  Service: General;  Laterality: Right;   PORTACATH PLACEMENT Left 08/17/2016   Procedure: INSERTION PORT-A-CATH WITH [place];  Surgeon: Rolm Bookbinder, MD;  Location: [facility];  Service: General;  Laterality: Left;   Family History  Problem Relation Age of Onset   COPD Father    Cancer Father    Social History   Socioeconomic History   Marital status: Married    Spouse name: Cynthia Chavez   Number of children: 3   Years of education: 11   Highest education level: 11th grade  Occupational History   Occupation: Scientist, water quality    Comment: [employer] part time  Tobacco Use   Smoking status: Every Day    Packs/day: 0.50    Years: 25.00    Pack years: 12.50    Types: Cigarettes    Start date: 04/19/1993   Smokeless tobacco: Never   Tobacco comments:    she stopped smoking for a long time, but started back.   Vaping Use   Vaping Use: Never used  Substance and Sexual Activity   Alcohol use: No   Drug use: No   Sexual activity: Not on file  Other Topics Concern   Not on file  Social History Narrative   One level living at home with her husband.   Children live nearby - visits with family once or twice per week.   Active still working part time, working outdoors, gardening, Social research officer, government.   Social Determinants of Radio broadcast assistant Strain: Low Risk    Difficulty of Paying Living Expenses: Not hard at all  Food Insecurity: No Food Insecurity   Worried About Charity fundraiser in the Last Year: Never true   Arboriculturist in the Last Year: Never true  Transportation Needs: No Transportation Needs   Lack of Transportation (Medical): No   Lack of Transportation (Non-Medical): No  Physical Activity: Sufficiently Active   Days of Exercise per Week: 5 days   Minutes of Exercise per  Session: 30 min  Stress: No Stress Concern Present   Feeling of Stress : Not at all  Social Connections: Socially Integrated   Frequency of Communication with Friends and Family: More than three times a week   Frequency of Social Gatherings with Friends and Family: Twice a week   Attends Religious Services: More than 4 times per year   Active Member of [employer] or Organizations: Yes   Attends Music therapist: More than 4 times per year   Marital Status: Married    Tobacco Counseling Ready to quit: Not Answered Counseling given: Not Answered Tobacco comments: she stopped smoking for a long time, but started back.    Clinical Intake:  Pre-visit preparation completed: Yes  Pain : No/denies pain     BMI - recorded: 27.25 Nutritional Status: BMI 25 -29 Overweight Nutritional Risks: None Diabetes: No  How often  do you need to have someone help you when you read instructions, pamphlets, or other written materials from your doctor or pharmacy?: 1 - Never  Diabetic? No  Interpreter Needed?: No  Information entered by :: Ripley Bogosian, LPN   Activities of Daily Living In your present state of health, do you have any difficulty performing the following activities: 05/13/2021  Hearing? N  Vision? N  Difficulty concentrating or making decisions? Y  Walking or climbing stairs? N  Dressing or bathing? N  Doing errands, shopping? N  Preparing Food and eating ? N  Using the Toilet? N  In the past six months, have you accidently leaked urine? N  Do you have problems with loss of bowel control? N  Managing your Medications? N  Managing your Finances? N  Housekeeping or managing your Housekeeping? N  Some recent data might be hidden    Patient Care Team: Janora Norlander, DO as PCP - General (Family Medicine) Lavonna Monarch, MD as Consulting Physician (Dermatology)  Indicate any recent Medical Services you may have received from other than [facility] providers in the  past year (date may be approximate).     Assessment:   This is a routine wellness examination for [place].  Hearing/Vision screen Hearing Screening - Comments:: Denies hearing difficulties  Vision Screening - Comments:: Wears reading glasses prn - no eye doctor  Dietary issues and exercise activities discussed: Current Exercise Habits: Home exercise routine, Type of exercise: walking, Time (Minutes): 30, Frequency (Times/Week): 5, Weekly Exercise (Minutes/Week): 150, Intensity: Mild, Exercise limited by: None identified   Goals Addressed             This Visit's Progress    DIET - DECREASE SODA OR JUICE INTAKE   On track      Depression Screen PHQ 2/9 Scores 05/13/2021 05/03/2021 11/23/2020 10/27/2020 03/04/2020 08/11/2019 11/11/2018  PHQ - 2 Score 0 0 0 0 0 0 0  PHQ- 9 Score - - 0 0 - - 0  Exception Documentation - - - - - - -    Fall Risk Fall Risk  05/13/2021 05/03/2021 03/04/2020 08/11/2019 11/11/2018  Falls in the past year? 0 0 0 0 0  Number falls in past yr: 0 - - - -  Injury with Fall? 0 - - - -  Risk for fall due to : No Fall Risks - - - -  Follow up Falls prevention discussed - - - -    FALL RISK PREVENTION PERTAINING TO THE HOME:  Any stairs in or around the home? No  If so, are there any without handrails? No  Home free of loose throw rugs in walkways, pet beds, electrical cords, etc? Yes  Adequate lighting in your home to reduce risk of falls? Yes   ASSISTIVE DEVICES UTILIZED TO PREVENT FALLS:  Life alert? No  Use of a cane, walker or w/c? No  Grab bars in the bathroom? No  Shower chair or bench in shower? No  Elevated toilet seat or a handicapped toilet? No   TIMED UP AND GO:  Was the test performed? No . Telephonic visit  Cognitive Function:     6CIT Screen 05/13/2021 08/11/2019 08/11/2019  What Year? 0 points 0 points 0 points  What month? 0 points 0 points 0 points  What time? 0 points 0 points 0 points  Count back from 20 0 points 0 points 0 points  Months  in reverse 2 points 0 points 0 points  Repeat phrase 6  points 6 points 6 points  Total Score 8 6 6     Immunizations Immunization History  Administered Date(s) Administered   Influenza Split 05/19/2020   Moderna Sars-Covid-2 Vaccination 09/17/2019, 10/15/2019, 04/16/2020    TDAP status: Due, Education has been provided regarding the importance of this vaccine. Advised may receive this vaccine at local pharmacy or Health Dept. Aware to provide a copy of the vaccination record if obtained from local pharmacy or Health Dept. Verbalized acceptance and understanding.  Flu Vaccine status: Due, Education has been provided regarding the importance of this vaccine. Advised may receive this vaccine at local pharmacy or Health Dept. Aware to provide a copy of the vaccination record if obtained from local pharmacy or Health Dept. Verbalized acceptance and understanding.  Pneumococcal vaccine status: Due, Education has been provided regarding the importance of this vaccine. Advised may receive this vaccine at local pharmacy or Health Dept. Aware to provide a copy of the vaccination record if obtained from local pharmacy or Health Dept. Verbalized acceptance and understanding.  Covid-19 vaccine status: Completed vaccines  Qualifies for Shingles Vaccine? Yes   Zostavax completed No   Shingrix Completed?: No.    Education has been provided regarding the importance of this vaccine. Patient has been advised to call insurance company to determine out of pocket expense if they have not yet received this vaccine. Advised may also receive vaccine at local pharmacy or Health Dept. Verbalized acceptance and understanding.  Screening Tests Health Maintenance  Topic Date Due   Zoster Vaccines- Shingrix (1 of 2) Never done   COLONOSCOPY (Pts 45-7yrs Insurance coverage will need to be confirmed)  Never done   DEXA SCAN  Never done   COVID-19 Vaccine (4 - Booster for Moderna series) 07/09/2020   MAMMOGRAM   08/12/2021   Hepatitis C Screening  Completed   HPV VACCINES  Aged Out   INFLUENZA VACCINE  Discontinued   TETANUS/TDAP  Discontinued    Health Maintenance  Health Maintenance Due  Topic Date Due   Zoster Vaccines- Shingrix (1 of 2) Never done   COLONOSCOPY (Pts 45-98yrs Insurance coverage will need to be confirmed)  Never done   DEXA SCAN  Never done   COVID-19 Vaccine (4 - Booster for Moderna series) 07/09/2020    Colorectal cancer screening: ordered Cologuard today  Mammogram status: Completed 08/12/2020. Repeat every year  Bone Density Scan: Due - wants to postpone for now  Lung Cancer Screening: (Low Dose CT Chest recommended if Age 68-80 years, 30 pack-year currently smoking OR have quit w/in 15years.) does not qualify.   Lung Cancer Screening Referral: she has routine chest ct for f/u on lung nodule with Dr Lindi Adie  Additional Screening:  Hepatitis C Screening: does qualify; Completed 05/01/2017  Vision Screening: Recommended annual ophthalmology exams for early detection of glaucoma and other disorders of the eye. Is the patient up to date with their annual eye exam?  No  Who is the provider or what is the name of the office in which the patient attends annual eye exams? none If pt is not established with a provider, would they like to be referred to a provider to establish care? No .   Dental Screening: Recommended annual dental exams for proper oral hygiene  Community Resource Referral / Chronic Care Management: CRR required this visit?  No   CCM required this visit?  No      Plan:     I have personally reviewed and noted the following in the patient's  chart:   Medical and social history Use of alcohol, tobacco or illicit drugs  Current medications and supplements including opioid prescriptions.  Functional ability and status Nutritional status Physical activity Advanced directives List of other physicians Hospitalizations, surgeries, and ER visits in  previous 12 months Vitals Screenings to include cognitive, depression, and falls Referrals and appointments  In addition, I have reviewed and discussed with patient certain preventive protocols, quality metrics, and best practice recommendations. A written personalized care plan for preventive services as well as general preventive health recommendations were provided to patient.     Sandrea Hammond, LPN   [id number]   Nurse Notes: None

## 2021-05-13 NOTE — Patient Instructions (Signed)
[name] , Thank you for taking time to come for your Medicare Wellness Visit. I appreciate your ongoing commitment to your health goals. Please review the following plan we discussed and let me know if I can assist you in the future.   Screening recommendations/referrals: Colonoscopy: Ordered Cologuard today - Repeat every 3 years Mammogram: Done 08/12/2020 - Repeat annually Bone Density: Due. Declines at this time - will do later Recommended yearly ophthalmology/optometry visit for glaucoma screening and checkup Recommended yearly dental visit for hygiene and checkup  Vaccinations: Influenza vaccine: Done 05/19/2020 - Repeat annually Pneumococcal vaccine: Due Tdap vaccine: Due Shingles vaccine: Due   Covid-19: Done 09/17/2019, ~10/15/2019, & ~04/16/2020  Advanced directives: Advance directive discussed with you today. Even though you declined this today, please call our office should you change your mind, and we can give you the proper paperwork for you to fill out.   Conditions/risks identified: Aim for 30 minutes of exercise or brisk walking each day, drink 6-8 glasses of water and eat lots of fruits and vegetables.   Next appointment: Follow up in one year for your annual wellness visit    Preventive Care 67 Years and Older, Female Preventive care refers to lifestyle choices and visits with your health care provider that can promote health and wellness. What does preventive care include? A yearly physical exam. This is also called an annual well check. Dental exams once or twice a year. Routine eye exams. Ask your health care provider how often you should have your eyes checked. Personal lifestyle choices, including: Daily care of your teeth and gums. Regular physical activity. Eating a healthy diet. Avoiding tobacco and drug use. Limiting alcohol use. Practicing safe sex. Taking low-dose aspirin every day. Taking vitamin and mineral supplements as recommended by your health  care provider. What happens during an annual well check? The services and screenings done by your health care provider during your annual well check will depend on your age, overall health, lifestyle risk factors, and family history of disease. Counseling  Your health care provider may ask you questions about your: Alcohol use. Tobacco use. Drug use. Emotional well-being. Home and relationship well-being. Sexual activity. Eating habits. History of falls. Memory and ability to understand (cognition). Work and work Statistician. Reproductive health. Screening  You may have the following tests or measurements: Height, weight, and BMI. Blood pressure. Lipid and cholesterol levels. These may be checked every 5 years, or more frequently if you are over 21 years old. Skin check. Lung cancer screening. You may have this screening every year starting at age 45 if you have a 30-pack-year history of smoking and currently smoke or have quit within the past 15 years. Fecal occult blood test (FOBT) of the stool. You may have this test every year starting at age 58. Flexible sigmoidoscopy or colonoscopy. You may have a sigmoidoscopy every 5 years or a colonoscopy every 10 years starting at age 67. Hepatitis C blood test. Hepatitis B blood test. Sexually transmitted disease (STD) testing. Diabetes screening. This is done by checking your blood sugar (glucose) after you have not eaten for a while (fasting). You may have this done every 1-3 years. Bone density scan. This is done to screen for osteoporosis. You may have this done starting at age 93. Mammogram. This may be done every 1-2 years. Talk to your health care provider about how often you should have regular mammograms. Talk with your health care provider about your test results, treatment options, and if necessary,  the need for more tests. Vaccines  Your health care provider may recommend certain vaccines, such as: Influenza vaccine. This is  recommended every year. Tetanus, diphtheria, and acellular pertussis (Tdap, Td) vaccine. You may need a Td booster every 10 years. Zoster vaccine. You may need this after age 64. Pneumococcal 13-valent conjugate (PCV13) vaccine. One dose is recommended after age 62. Pneumococcal polysaccharide (PPSV23) vaccine. One dose is recommended after age 44. Talk to your health care provider about which screenings and vaccines you need and how often you need them. This information is not intended to replace advice given to you by your health care provider. Make sure you discuss any questions you have with your health care provider. Document Released: 08/20/2015 Document Revised: 04/12/2016 Document Reviewed: 05/25/2015 Elsevier Interactive Patient Education  2017 [place] Prevention in the Home Falls can cause injuries. They can happen to people of all ages. There are many things you can do to make your home safe and to help prevent falls. What can I do on the outside of my home? Regularly fix the edges of walkways and driveways and fix any cracks. Remove anything that might make you trip as you walk through a door, such as a raised step or threshold. Trim any bushes or trees on the path to your home. Use bright outdoor lighting. Clear any walking paths of anything that might make someone trip, such as rocks or tools. Regularly check to see if handrails are loose or broken. Make sure that both sides of any steps have handrails. Any raised decks and porches should have guardrails on the edges. Have any leaves, snow, or ice cleared regularly. Use sand or salt on walking paths during winter. Clean up any spills in your garage right away. This includes oil or grease spills. What can I do in the bathroom? Use night lights. Install grab bars by the toilet and in the tub and shower. Do not use towel bars as grab bars. Use non-skid mats or decals in the tub or shower. If you need to sit down in  the shower, use a plastic, non-slip stool. Keep the floor dry. Clean up any water that spills on the floor as soon as it happens. Remove soap buildup in the tub or shower regularly. Attach bath mats securely with double-sided non-slip rug tape. Do not have throw rugs and other things on the floor that can make you trip. What can I do in the bedroom? Use night lights. Make sure that you have a light by your bed that is easy to reach. Do not use any sheets or blankets that are too big for your bed. They should not hang down onto the floor. Have a firm chair that has side arms. You can use this for support while you get dressed. Do not have throw rugs and other things on the floor that can make you trip. What can I do in the kitchen? Clean up any spills right away. Avoid walking on wet floors. Keep items that you use a lot in easy-to-reach places. If you need to reach something above you, use a strong step stool that has a grab bar. Keep electrical cords out of the way. Do not use floor polish or wax that makes floors slippery. If you must use wax, use non-skid floor wax. Do not have throw rugs and other things on the floor that can make you trip. What can I do with my stairs? Do not leave any items on  the stairs. Make sure that there are handrails on both sides of the stairs and use them. Fix handrails that are broken or loose. Make sure that handrails are as long as the stairways. Check any carpeting to make sure that it is firmly attached to the stairs. Fix any carpet that is loose or worn. Avoid having throw rugs at the top or bottom of the stairs. If you do have throw rugs, attach them to the floor with carpet tape. Make sure that you have a light switch at the top of the stairs and the bottom of the stairs. If you do not have them, ask someone to add them for you. What else can I do to help prevent falls? Wear shoes that: Do not have high heels. Have rubber bottoms. Are comfortable  and fit you well. Are closed at the toe. Do not wear sandals. If you use a stepladder: Make sure that it is fully opened. Do not climb a closed stepladder. Make sure that both sides of the stepladder are locked into place. Ask someone to hold it for you, if possible. Clearly mark and make sure that you can see: Any grab bars or handrails. First and last steps. Where the edge of each step is. Use tools that help you move around (mobility aids) if they are needed. These include: Canes. Walkers. Scooters. Crutches. Turn on the lights when you go into a dark area. Replace any light bulbs as soon as they burn out. Set up your furniture so you have a clear path. Avoid moving your furniture around. If any of your floors are uneven, fix them. If there are any pets around you, be aware of where they are. Review your medicines with your doctor. Some medicines can make you feel dizzy. This can increase your chance of falling. Ask your doctor what other things that you can do to help prevent falls. This information is not intended to replace advice given to you by your health care provider. Make sure you discuss any questions you have with your health care provider. Document Released: 05/20/2009 Document Revised: 12/30/2015 Document Reviewed: 08/28/2014 Elsevier Interactive Patient Education  2017 [employer].

## 2021-05-13 NOTE — Addendum Note (Signed)
Addended by: Adalberto Cole E on: 05/13/2021 11:04 AM   Modules accepted: Level of Service

## 2021-06-15 ENCOUNTER — Other Ambulatory Visit: Payer: Self-pay | Admitting: General Surgery

## 2021-06-15 DIAGNOSIS — Z1231 Encounter for screening mammogram for malignant neoplasm of breast: Secondary | ICD-10-CM

## 2021-07-12 DIAGNOSIS — R490 Dysphonia: Secondary | ICD-10-CM | POA: Diagnosis not present

## 2021-07-12 DIAGNOSIS — F17210 Nicotine dependence, cigarettes, uncomplicated: Secondary | ICD-10-CM | POA: Diagnosis not present

## 2021-07-12 DIAGNOSIS — K219 Gastro-esophageal reflux disease without esophagitis: Secondary | ICD-10-CM | POA: Diagnosis not present

## 2021-07-25 ENCOUNTER — Inpatient Hospital Stay (HOSPITAL_COMMUNITY): Payer: Medicare HMO

## 2021-07-25 ENCOUNTER — Encounter (HOSPITAL_COMMUNITY): Payer: Self-pay

## 2021-07-25 ENCOUNTER — Other Ambulatory Visit: Payer: Self-pay

## 2021-07-25 ENCOUNTER — Inpatient Hospital Stay (HOSPITAL_COMMUNITY)
Admission: EM | Admit: 2021-07-25 | Discharge: 2021-07-28 | DRG: 175 | Disposition: A | Discharge status: Home / Self Care | Payer: Medicare HMO | Attending: Internal Medicine | Admitting: Internal Medicine

## 2021-07-25 ENCOUNTER — Emergency Department (HOSPITAL_COMMUNITY): Payer: Medicare HMO

## 2021-07-25 DIAGNOSIS — J90 Pleural effusion, not elsewhere classified: Secondary | ICD-10-CM | POA: Diagnosis not present

## 2021-07-25 DIAGNOSIS — Z9011 Acquired absence of right breast and nipple: Secondary | ICD-10-CM

## 2021-07-25 DIAGNOSIS — Z86711 Personal history of pulmonary embolism: Secondary | ICD-10-CM | POA: Diagnosis not present

## 2021-07-25 DIAGNOSIS — R0609 Other forms of dyspnea: Secondary | ICD-10-CM

## 2021-07-25 DIAGNOSIS — Z79890 Hormone replacement therapy (postmenopausal): Secondary | ICD-10-CM

## 2021-07-25 DIAGNOSIS — J40 Bronchitis, not specified as acute or chronic: Secondary | ICD-10-CM | POA: Diagnosis present

## 2021-07-25 DIAGNOSIS — Z888 Allergy status to other drugs, medicaments and biological substances status: Secondary | ICD-10-CM | POA: Diagnosis not present

## 2021-07-25 DIAGNOSIS — Z79899 Other long term (current) drug therapy: Secondary | ICD-10-CM | POA: Diagnosis not present

## 2021-07-25 DIAGNOSIS — R1013 Epigastric pain: Secondary | ICD-10-CM | POA: Diagnosis not present

## 2021-07-25 DIAGNOSIS — Z9221 Personal history of antineoplastic chemotherapy: Secondary | ICD-10-CM | POA: Diagnosis not present

## 2021-07-25 DIAGNOSIS — R0689 Other abnormalities of breathing: Secondary | ICD-10-CM | POA: Diagnosis not present

## 2021-07-25 DIAGNOSIS — R000 Tachycardia, unspecified: Secondary | ICD-10-CM | POA: Diagnosis not present

## 2021-07-25 DIAGNOSIS — F17210 Nicotine dependence, cigarettes, uncomplicated: Secondary | ICD-10-CM | POA: Diagnosis present

## 2021-07-25 DIAGNOSIS — I82411 Acute embolism and thrombosis of right femoral vein: Secondary | ICD-10-CM | POA: Diagnosis not present

## 2021-07-25 DIAGNOSIS — Z885 Allergy status to narcotic agent status: Secondary | ICD-10-CM

## 2021-07-25 DIAGNOSIS — J189 Pneumonia, unspecified organism: Secondary | ICD-10-CM | POA: Diagnosis not present

## 2021-07-25 DIAGNOSIS — Z853 Personal history of malignant neoplasm of breast: Secondary | ICD-10-CM

## 2021-07-25 DIAGNOSIS — Z91041 Radiographic dye allergy status: Secondary | ICD-10-CM

## 2021-07-25 DIAGNOSIS — I2699 Other pulmonary embolism without acute cor pulmonale: Principal | ICD-10-CM | POA: Diagnosis present

## 2021-07-25 DIAGNOSIS — I82441 Acute embolism and thrombosis of right tibial vein: Secondary | ICD-10-CM | POA: Diagnosis present

## 2021-07-25 DIAGNOSIS — Z825 Family history of asthma and other chronic lower respiratory diseases: Secondary | ICD-10-CM | POA: Diagnosis not present

## 2021-07-25 DIAGNOSIS — E039 Hypothyroidism, unspecified: Secondary | ICD-10-CM | POA: Diagnosis present

## 2021-07-25 DIAGNOSIS — Z20822 Contact with and (suspected) exposure to covid-19: Secondary | ICD-10-CM | POA: Diagnosis not present

## 2021-07-25 DIAGNOSIS — I82431 Acute embolism and thrombosis of right popliteal vein: Secondary | ICD-10-CM | POA: Diagnosis not present

## 2021-07-25 DIAGNOSIS — K219 Gastro-esophageal reflux disease without esophagitis: Secondary | ICD-10-CM | POA: Diagnosis present

## 2021-07-25 DIAGNOSIS — Z923 Personal history of irradiation: Secondary | ICD-10-CM

## 2021-07-25 DIAGNOSIS — R0602 Shortness of breath: Secondary | ICD-10-CM | POA: Diagnosis not present

## 2021-07-25 DIAGNOSIS — I82409 Acute embolism and thrombosis of unspecified deep veins of unspecified lower extremity: Secondary | ICD-10-CM | POA: Diagnosis present

## 2021-07-25 DIAGNOSIS — Z881 Allergy status to other antibiotic agents status: Secondary | ICD-10-CM | POA: Diagnosis not present

## 2021-07-25 DIAGNOSIS — R10816 Epigastric abdominal tenderness: Secondary | ICD-10-CM | POA: Diagnosis not present

## 2021-07-25 DIAGNOSIS — I82403 Acute embolism and thrombosis of unspecified deep veins of lower extremity, bilateral: Secondary | ICD-10-CM | POA: Insufficient documentation

## 2021-07-25 DIAGNOSIS — J9601 Acute respiratory failure with hypoxia: Secondary | ICD-10-CM | POA: Diagnosis not present

## 2021-07-25 DIAGNOSIS — K449 Diaphragmatic hernia without obstruction or gangrene: Secondary | ICD-10-CM | POA: Diagnosis not present

## 2021-07-25 DIAGNOSIS — R5381 Other malaise: Secondary | ICD-10-CM

## 2021-07-25 DIAGNOSIS — J984 Other disorders of lung: Secondary | ICD-10-CM | POA: Diagnosis not present

## 2021-07-25 DIAGNOSIS — S43401A Unspecified sprain of right shoulder joint, initial encounter: Secondary | ICD-10-CM

## 2021-07-25 DIAGNOSIS — I82491 Acute embolism and thrombosis of other specified deep vein of right lower extremity: Secondary | ICD-10-CM | POA: Diagnosis not present

## 2021-07-25 DIAGNOSIS — R7989 Other specified abnormal findings of blood chemistry: Secondary | ICD-10-CM

## 2021-07-25 DIAGNOSIS — R0902 Hypoxemia: Secondary | ICD-10-CM | POA: Diagnosis not present

## 2021-07-25 DIAGNOSIS — R778 Other specified abnormalities of plasma proteins: Secondary | ICD-10-CM | POA: Diagnosis not present

## 2021-07-25 DIAGNOSIS — I248 Other forms of acute ischemic heart disease: Secondary | ICD-10-CM | POA: Diagnosis not present

## 2021-07-25 LAB — ECHOCARDIOGRAM COMPLETE
AR max vel: 1.65 cm2
AV Area VTI: 1.91 cm2
AV Area mean vel: 1.87 cm2
AV Mean grad: 4 mmHg
AV Peak grad: 6.9 mmHg
Ao pk vel: 1.31 m/s
Area-P 1/2: 4.29 cm2
Calc EF: 51.9 %
Height: 67 in
MV VTI: 2.49 cm2
S' Lateral: 2.3 cm
Single Plane A2C EF: 46.9 %
Single Plane A4C EF: 54.1 %
Weight: 2610.25 oz

## 2021-07-25 LAB — COMPREHENSIVE METABOLIC PANEL
ALT: 51 U/L — ABNORMAL HIGH (ref 0–44)
AST: 41 U/L (ref 15–41)
Albumin: 3.4 g/dL — ABNORMAL LOW (ref 3.5–5.0)
Alkaline Phosphatase: 73 U/L (ref 38–126)
Anion gap: 11 (ref 5–15)
BUN: 15 mg/dL (ref 8–23)
CO2: 22 mmol/L (ref 22–32)
Calcium: 8.6 mg/dL — ABNORMAL LOW (ref 8.9–10.3)
Chloride: 103 mmol/L (ref 98–111)
Creatinine, Ser: 0.91 mg/dL (ref 0.44–1.00)
GFR, Estimated: >60 mL/min (ref >60)
Glucose, Bld: 141 mg/dL — ABNORMAL HIGH (ref 70–99)
Potassium: 3.8 mmol/L (ref 3.5–5.1)
Sodium: 136 mmol/L (ref 135–145)
Total Bilirubin: 0.9 mg/dL (ref 0.3–1.2)
Total Protein: 7.3 g/dL (ref 6.5–8.1)

## 2021-07-25 LAB — TROPONIN I (HIGH SENSITIVITY)
Troponin I (High Sensitivity): 236 ng/L (ref <18)
Troponin I (High Sensitivity): 420 ng/L (ref <18)
Troponin I (High Sensitivity): 311 ng/L (ref <18)
Troponin I (High Sensitivity): 300 ng/L (ref <18)

## 2021-07-25 LAB — RESP PANEL BY RT-PCR (FLU A&B, COVID) ARPGX2
Influenza A by PCR: NEGATIVE
Influenza B by PCR: NEGATIVE
SARS Coronavirus 2 by RT PCR: NEGATIVE

## 2021-07-25 LAB — CBC WITH DIFFERENTIAL/PLATELET
Abs Immature Granulocytes: 0.05 10*3/uL (ref 0.00–0.07)
Basophils Absolute: 0 10*3/uL (ref 0.0–0.1)
Basophils Relative: 0 %
Eosinophils Absolute: 0.1 10*3/uL (ref 0.0–0.5)
Eosinophils Relative: 1 %
HCT: 44.3 % (ref 36.0–46.0)
Hemoglobin: 14.5 g/dL (ref 12.0–15.0)
Immature Granulocytes: 0 %
Lymphocytes Relative: 5 %
Lymphs Abs: 0.7 10*3/uL (ref 0.7–4.0)
MCH: 32 pg (ref 26.0–34.0)
MCHC: 32.7 g/dL (ref 30.0–36.0)
MCV: 97.8 fL (ref 80.0–100.0)
Monocytes Absolute: 0.4 10*3/uL (ref 0.1–1.0)
Monocytes Relative: 3 %
Neutro Abs: 11.8 10*3/uL — ABNORMAL HIGH (ref 1.7–7.7)
Neutrophils Relative %: 91 %
Platelets: 205 10*3/uL (ref 150–400)
RBC: 4.53 MIL/uL (ref 3.87–5.11)
RDW: 13 % (ref 11.5–15.5)
WBC: 13.1 10*3/uL — ABNORMAL HIGH (ref 4.0–10.5)
nRBC: 0 % (ref 0.0–0.2)

## 2021-07-25 LAB — LACTIC ACID, PLASMA
Lactic Acid, Venous: 1 mmol/L (ref 0.5–1.9)
Lactic Acid, Venous: 1.3 mmol/L (ref 0.5–1.9)

## 2021-07-25 LAB — HEPARIN LEVEL (UNFRACTIONATED): Heparin Unfractionated: 0.19 IU/mL — ABNORMAL LOW (ref 0.30–0.70)

## 2021-07-25 LAB — LIPASE, BLOOD: Lipase: 22 U/L (ref 11–51)

## 2021-07-25 MED ORDER — ONDANSETRON HCL 4 MG PO TABS: 4.0000 mg | ORAL_TABLET | Freq: Four times a day (QID) | ORAL | Status: DC | PRN

## 2021-07-25 MED ORDER — DM-GUAIFENESIN ER 30-600 MG PO TB12
1.0000 | ORAL_TABLET | Freq: Two times a day (BID) | ORAL | Status: DC
  Administered 2021-07-26 – 2021-07-28 (×5): 1 via ORAL
  Filled 2021-07-25 (×5): qty 1

## 2021-07-25 MED ORDER — TRAZODONE HCL 50 MG PO TABS
50.0000 mg | ORAL_TABLET | Freq: Every evening | ORAL | Status: DC | PRN
  Administered 2021-07-26: 21:00:00 50 mg via ORAL
  Filled 2021-07-25: qty 1

## 2021-07-25 MED ORDER — ALBUTEROL SULFATE (2.5 MG/3ML) 0.083% IN NEBU: 2.5000 mg | INHALATION_SOLUTION | RESPIRATORY_TRACT | Status: DC | PRN

## 2021-07-25 MED ORDER — SODIUM CHLORIDE 0.9 % IV SOLN: INTRAVENOUS | Status: AC

## 2021-07-25 MED ORDER — SODIUM CHLORIDE 0.9 % IV SOLN
1.0000 g | INTRAVENOUS | Status: AC
  Administered 2021-07-26 – 2021-07-27 (×3): 1 g via INTRAVENOUS
  Filled 2021-07-25 (×3): qty 10

## 2021-07-25 MED ORDER — ACETAMINOPHEN 325 MG PO TABS
650.0000 mg | ORAL_TABLET | Freq: Once | ORAL | Status: AC
  Administered 2021-07-25: 19:00:00 650 mg via ORAL
  Filled 2021-07-25: qty 2

## 2021-07-25 MED ORDER — SODIUM CHLORIDE 0.9 % IV SOLN
2.0000 g | Freq: Once | INTRAVENOUS | Status: AC
  Administered 2021-07-25: 09:00:00 2 g via INTRAVENOUS
  Filled 2021-07-25: qty 20

## 2021-07-25 MED ORDER — TAMOXIFEN CITRATE 10 MG PO TABS
10.0000 mg | ORAL_TABLET | Freq: Every day | ORAL | Status: DC
  Administered 2021-07-26 – 2021-07-28 (×3): 10 mg via ORAL
  Filled 2021-07-25 (×4): qty 1

## 2021-07-25 MED ORDER — SODIUM CHLORIDE 0.9% FLUSH
3.0000 mL | Freq: Two times a day (BID) | INTRAVENOUS | Status: DC
  Administered 2021-07-26 – 2021-07-27 (×3): 3 mL via INTRAVENOUS

## 2021-07-25 MED ORDER — HEPARIN BOLUS VIA INFUSION
2000.0000 [IU] | Freq: Once | INTRAVENOUS | Status: AC
  Administered 2021-07-25: 20:00:00 2000 [IU] via INTRAVENOUS
  Filled 2021-07-25: qty 2000

## 2021-07-25 MED ORDER — SODIUM CHLORIDE 0.9% FLUSH
3.0000 mL | Freq: Two times a day (BID) | INTRAVENOUS | Status: DC
  Administered 2021-07-26 – 2021-07-28 (×3): 3 mL via INTRAVENOUS

## 2021-07-25 MED ORDER — SODIUM CHLORIDE 0.9 % IV SOLN: 250.0000 mL | INTRAVENOUS | Status: DC | PRN

## 2021-07-25 MED ORDER — SODIUM CHLORIDE 0.9 % IV BOLUS
500.0000 mL | Freq: Once | INTRAVENOUS | Status: AC
  Administered 2021-07-25: 08:00:00 500 mL via INTRAVENOUS

## 2021-07-25 MED ORDER — ONDANSETRON HCL 4 MG/2ML IJ SOLN
4.0000 mg | Freq: Four times a day (QID) | INTRAMUSCULAR | Status: DC | PRN
  Administered 2021-07-25: 20:00:00 4 mg via INTRAVENOUS
  Filled 2021-07-25: qty 2

## 2021-07-25 MED ORDER — TRAMADOL HCL 50 MG PO TABS: 50.0000 mg | ORAL_TABLET | Freq: Four times a day (QID) | ORAL | Status: DC | PRN

## 2021-07-25 MED ORDER — HEPARIN (PORCINE) 25000 UT/250ML-% IV SOLN
1850.0000 [IU]/h | INTRAVENOUS | Status: DC
  Administered 2021-07-25: 11:00:00 800 [IU]/h via INTRAVENOUS
  Administered 2021-07-26: 07:00:00 1300 [IU]/h via INTRAVENOUS
  Administered 2021-07-26: 1700 [IU]/h via INTRAVENOUS
  Administered 2021-07-27: 16:00:00 1850 [IU]/h via INTRAVENOUS
  Filled 2021-07-25 (×4): qty 250

## 2021-07-25 MED ORDER — SODIUM CHLORIDE 0.9% FLUSH: 3.0000 mL | INTRAVENOUS | Status: DC | PRN

## 2021-07-25 MED ORDER — POLYETHYLENE GLYCOL 3350 17 G PO PACK: 17.0000 g | PACK | Freq: Every day | ORAL | Status: DC | PRN

## 2021-07-25 MED ORDER — ACETAMINOPHEN 325 MG PO TABS
650.0000 mg | ORAL_TABLET | Freq: Four times a day (QID) | ORAL | Status: DC | PRN
  Administered 2021-07-26 (×2): 650 mg via ORAL
  Filled 2021-07-25 (×3): qty 2

## 2021-07-25 MED ORDER — TRAMADOL HCL 50 MG PO TABS
50.0000 mg | ORAL_TABLET | Freq: Once | ORAL | Status: AC
  Administered 2021-07-25: 19:00:00 50 mg via ORAL
  Filled 2021-07-25: qty 1

## 2021-07-25 MED ORDER — BISACODYL 10 MG RE SUPP: 10.0000 mg | Freq: Every day | RECTAL | Status: DC | PRN

## 2021-07-25 MED ORDER — DOXYCYCLINE HYCLATE 100 MG PO TABS
100.0000 mg | ORAL_TABLET | Freq: Once | ORAL | Status: AC
  Administered 2021-07-25: 09:00:00 100 mg via ORAL
  Filled 2021-07-25: qty 1

## 2021-07-25 MED ORDER — NICOTINE 14 MG/24HR TD PT24
14.0000 mg | MEDICATED_PATCH | Freq: Every day | TRANSDERMAL | Status: DC
  Administered 2021-07-25: 19:00:00 14 mg via TRANSDERMAL
  Filled 2021-07-25: qty 1

## 2021-07-25 MED ORDER — ACETAMINOPHEN 650 MG RE SUPP: 650.0000 mg | Freq: Four times a day (QID) | RECTAL | Status: DC | PRN

## 2021-07-25 MED ORDER — TECHNETIUM TO 99M ALBUMIN AGGREGATED
4.0000 | Freq: Once | INTRAVENOUS | Status: AC | PRN
  Administered 2021-07-25: 12:00:00 4.11 via INTRAVENOUS

## 2021-07-25 MED ORDER — LEVOTHYROXINE SODIUM 75 MCG PO TABS
150.0000 ug | ORAL_TABLET | Freq: Every day | ORAL | Status: DC
  Administered 2021-07-26 – 2021-07-28 (×3): 150 ug via ORAL
  Filled 2021-07-25 (×3): qty 2

## 2021-07-25 MED ORDER — PANTOPRAZOLE SODIUM 40 MG PO TBEC
40.0000 mg | DELAYED_RELEASE_TABLET | Freq: Every day | ORAL | Status: DC
  Administered 2021-07-25 – 2021-07-28 (×3): 40 mg via ORAL
  Filled 2021-07-25 (×4): qty 1

## 2021-07-25 MED ORDER — HEPARIN BOLUS VIA INFUSION
4000.0000 [IU] | Freq: Once | INTRAVENOUS | Status: AC
  Administered 2021-07-25: 11:00:00 4000 [IU] via INTRAVENOUS

## 2021-07-25 NOTE — ED Notes (Signed)
Pt returned from CT °

## 2021-07-25 NOTE — Progress Notes (Addendum)
ANTICOAGULATION CONSULT NOTE - Follow Up Consult  Pharmacy Consult for heparin Indication: chest pain/ACS DVT/PE  Allergies  Allergen Reactions   Ivp Dye [Iodinated Diagnostic Agents] Other (See Comments)    Chest pain   Other Shortness Of Breath, Swelling and Other (See Comments)    Versed or Fentanyl when used for endoscopy caused throat and tongue swelling   Augmentin [Amoxicillin-Pot Clavulanate]    Dilaudid [Hydromorphone Hcl]     PT had CP and felt shaky. Didn't like the feeling, but sx lessened w/in 10 min of IV administration   Asa [Aspirin] Other (See Comments)    spasms   Codeine Other (See Comments)    spasms    Patient Measurements: Height: 5\' 7"  (170.2 cm) Weight: 66.7 kg (147 lb) IBW/kg (Calculated) : 61.6 Heparin Dosing Weight: 67kg  Vital Signs: Temp: 97.9 F (36.6 C) (12/19 0626) BP: 112/95 (12/19 1000) Pulse Rate: 96 (12/19 1000)  Labs: Recent Labs    07/25/21 0717 07/25/21 0910  HGB 14.5  --   HCT 44.3  --   PLT 205  --   CREATININE 0.91  --   TROPONINIHS 236* 420*    Estimated Creatinine Clearance: 58.3 mL/min (by C-G formula based on SCr of 0.91 mg/dL).   Medications:  (Not in a hospital admission)   Assessment: Pharmacy consulted to dose heparin in patient with chest pain/ACS.  Patient is not on anticoagulation prior to admission.  MD now wants to treat for PE/DVT  Goal of Therapy:  Heparin level 0.3-0.7 units/ml Monitor platelets by anticoagulation protocol: Yes   Plan:  Give 4000 units bolus x 1 Start heparin infusion at 1100 units/hr Check anti-Xa level in 6-8 hours and daily. Continue to monitor H&H and platelets.   Margot Ables, PharmD Clinical Pharmacist 07/25/2021 10:52 AM

## 2021-07-25 NOTE — Progress Notes (Signed)
ANTICOAGULATION CONSULT NOTE - Follow Up Consult  Pharmacy Consult for heparin Indication: chest pain/ACS DVT/PE  Allergies  Allergen Reactions   Beef-Derived Products Hives, Shortness Of Breath, Swelling and Rash   Ivp Dye [Iodinated Diagnostic Agents] Other (See Comments)    Chest pain   Other Shortness Of Breath, Swelling and Other (See Comments)    Versed or Fentanyl when used for endoscopy caused throat and tongue swelling   Augmentin [Amoxicillin-Pot Clavulanate]    Dilaudid [Hydromorphone Hcl]     PT had CP and felt shaky. Didn't like the feeling, but sx lessened w/in 10 min of IV administration   Codeine Other (See Comments)    spasms    Patient Measurements: Height: 5\' 7"  (170.2 cm) Weight: 74 kg (163 lb 2.3 oz) IBW/kg (Calculated) : 61.6 Heparin Dosing Weight: 67kg  Vital Signs: Temp: 97.9 F (36.6 C) (12/19 1700) Temp Source: Oral (12/19 1700) BP: 122/79 (12/19 1700) Pulse Rate: 96 (12/19 1700)  Labs: Recent Labs    07/25/21 0717 07/25/21 0910 07/25/21 1435 07/25/21 1612 07/25/21 1855  HGB 14.5  --   --   --   --   HCT 44.3  --   --   --   --   PLT 205  --   --   --   --   HEPARINUNFRC  --   --   --   --  0.19*  CREATININE 0.91  --   --   --   --   TROPONINIHS 236* 420* 311* 300*  --     Estimated Creatinine Clearance: 63.1 mL/min (by C-G formula based on SCr of 0.91 mg/dL).   Medications: see MAR  Assessment: Cynthia Chavez presented to ED with SOB and CP 2 weeks post laryngoscopy, found to have elevated troponins (max 420), bilateral PE on VQ scan and R leg DVT on doppler. Pharmacy consulted to dose heparin for ACS and PE/DVT. No anticoagulation PTA.   Heparin level 0.19 subtherapeutic after initial bolus and 1100 unit/hr infusion. CBC normal.   Goal of Therapy:  Heparin level 0.3-0.7 units/ml Monitor platelets by anticoagulation protocol: Yes   Plan:  Give heparin bolus 2000 units x1 Increase heparin infusion to 1300 units/hr 6 hour heparin  level Daily heparin level, CBC F/U PO anticoagulation plans  Laurey Arrow, PharmD PGY1 Pharmacy Resident 07/25/2021  7:34 PM  Please check [email or web address] for unit-specific pharmacy phone numbers.

## 2021-07-25 NOTE — ED Notes (Signed)
Upon assessment pt O2 91%. Pt placed on 2L [place]. O2 100%.

## 2021-07-25 NOTE — ED Provider Notes (Signed)
[facility] EMERGENCY DEPARTMENT Provider Note   CSN: [id number] Arrival date & time: 07/25/21  3903     History Chief Complaint  Patient presents with   Shortness of Breath    Cynthia Chavez is a 65 y.o. female presenting to Emergency Department with chest pain and abdominal pain.  She reports she underwent laryngoscopy 2 weeks ago.  She reports since her procedure she has noted she has intermittent pains which seem to occur in the upper left and right quadrant and radiate towards her back, also associated with some nausea, loss of appetite, subjective shortness of breath.  She is a lifetime smoker, reports she has not been smoking for the past 2 weeks.  She denies that she uses oxygen at home.  She denies vomiting, reports she has chronic issues with bowel movements.  HPI     Past Medical History:  Diagnosis Date   Anxiety    Breast cancer ([place])    Cancer ([place])    breast cancer   Depression    Edema    bilateral feet and leg swelling   GERD (gastroesophageal reflux disease)    History of hiatal hernia    History of radiation therapy 02/12/17- 03/26/17   Chest wall and regional nodes, right- 50 Gy in 25 fractions, Chest Wall, boost, right- 10 Gy in 5 fractions.    Hypothyroidism    Malaise    Personal history of chemotherapy    Personal history of radiation therapy    Sprain of shoulder, right 11/23/2020   Thyroid disease    Varicose veins     Patient Active Problem List   Diagnosis Date Noted   Port catheter in place 09/08/2016   Malignant neoplasm of upper-outer quadrant of right breast in female, estrogen receptor positive ([place]) 08/14/2016   Breast cancer of upper-outer quadrant of right female breast ([place]) 08/13/2016   Varicose veins of lower extremities with other complications 00/92/3300   Hypothyroidism 04/24/2013    Past Surgical History:  Procedure Laterality Date   ABDOMINAL HYSTERECTOMY     AXILLARY LYMPH NODE BIOPSY Right 01/03/2017   Procedure: RIGHT  AXILLARY LYMPH NODE SEED GUIDED EXCISIONAL BIOPSY;  Surgeon: Rolm Bookbinder, MD;  Location: [place];  Service: General;  Laterality: Right;   BREAST SURGERY     CHOLECYSTECTOMY     COLONOSCOPY     ESOPHAGEAL DILATION     MASTECTOMY Right    MASTECTOMY W/ SENTINEL NODE BIOPSY Right 01/03/2017   axillary   MASTECTOMY W/ SENTINEL NODE BIOPSY Right 01/03/2017   Procedure: RIGHT TOTAL MASTECTOMY WITH RIGHT AXILLARY SENTINEL LYMPH NODE BIOPSY;  Surgeon: Rolm Bookbinder, MD;  Location: [place];  Service: General;  Laterality: Right;   PORTACATH PLACEMENT Left 08/17/2016   Procedure: INSERTION PORT-A-CATH WITH [place];  Surgeon: Rolm Bookbinder, MD;  Location: [place];  Service: General;  Laterality: Left;     OB History   No obstetric history on file.     Family History  Problem Relation Age of Onset   COPD Father    Cancer Father     Social History   Tobacco Use   Smoking status: Every Day    Packs/day: 0.50    Years: 25.00    Pack years: 12.50    Types: Cigarettes    Start date: 04/19/1993   Smokeless tobacco: Never   Tobacco comments:    she stopped smoking for a long time, but started back.   Vaping Use   Vaping  Use: Never used  Substance Use Topics   Alcohol use: No   Drug use: No    Home Medications Prior to Admission medications   Medication Sig Start Date End Date Taking? Authorizing Provider  acetaminophen (TYLENOL) 500 MG tablet Take 1 tablet (500 mg total) by mouth every 6 (six) hours as needed. 11/23/20   Ivy Lynn, NP  betamethasone valerate ointment (VALISONE) 0.1 % Apply 1 application topically 2 (two) times daily. x10-14days per psoriatic flare 10/27/20   Janora Norlander, DO  Chlorphen-PE-Acetaminophen 4-10-325 MG TABS Take 1 tablet by mouth every 6 (six) hours as needed. 02/11/21   Hassell Done Mary-Margaret, FNP  levothyroxine (SYNTHROID) 150 MCG tablet TAKE 1 TABLET (150 MCG TOTAL) BY MOUTH DAILY BEFORE BREAKFAST. 12/08/20   Hawks, Christy  A, FNP  methocarbamol (ROBAXIN) 500 MG tablet Take 1 tablet (500 mg total) by mouth 4 (four) times daily. 11/23/20   Ivy Lynn, NP  tamoxifen (NOLVADEX) 20 MG tablet TAKE 1/2 TABLET EVERY DAY 04/13/21   Nicholas Lose, MD  triamcinolone (NASACORT) 55 MCG/ACT AERO nasal inhaler Place 2 sprays into the nose daily. 02/11/21   Chevis Pretty, FNP    Allergies    Ivp dye [iodinated diagnostic agents], Other, Augmentin [amoxicillin-pot clavulanate], Dilaudid [hydromorphone hcl], Asa [aspirin], and Codeine  Review of Systems   Review of Systems  Constitutional:  Negative for chills and fever.  HENT:  Negative for ear pain and sore throat.   Eyes:  Negative for pain and visual disturbance.  Respiratory:  Positive for shortness of breath. Negative for cough.   Cardiovascular:  Positive for chest pain. Negative for palpitations.  Gastrointestinal:  Negative for abdominal pain and vomiting.  Genitourinary:  Negative for dysuria and hematuria.  Musculoskeletal:  Negative for arthralgias and back pain.  Skin:  Negative for color change and rash.  Neurological:  Negative for seizures and syncope.  All other systems reviewed and are negative.  Physical Exam Updated Vital Signs BP (!) 110/91 (BP Location: Left Wrist)    Pulse (!) 113    Temp 97.9 F (36.6 C)    Resp (!) 21    Ht 5\' 7"  (1.702 m)    Wt 66.7 kg    SpO2 94%    BMI 23.02 kg/m   Physical Exam Constitutional:      General: She is not in acute distress. HENT:     Head: Normocephalic and atraumatic.  Eyes:     Conjunctiva/sclera: Conjunctivae normal.     Pupils: Pupils are equal, round, and reactive to light.  Cardiovascular:     Rate and Rhythm: Normal rate and regular rhythm.  Pulmonary:     Effort: Pulmonary effort is normal. No respiratory distress.     Comments: 92% on room air, rhonchi lung bases, L>R Abdominal:     General: There is no distension.     Tenderness: There is no abdominal tenderness.  Skin:     General: Skin is warm and dry.  Neurological:     General: No focal deficit present.     Mental Status: She is alert. Mental status is at baseline.  Psychiatric:        Mood and Affect: Mood normal.        Behavior: Behavior normal.    ED Results / Procedures / Treatments   Labs (all labs ordered are listed, but only abnormal results are displayed) Labs Reviewed - No data to display  EKG None  Radiology No results found.  Procedures Procedures   Medications Ordered in ED Medications - No data to display  ED Course  I have reviewed the triage vital signs and the nursing notes.  Pertinent labs & imaging results that were available during my care of the patient were reviewed by me and considered in my medical decision making (see chart for details).  This patient presents to the Emergency Department with complaint of chest pain. This involves an extensive number of treatment options, and is a complaint that carries with it a high risk of complications and morbidity.  The differential diagnosis includes ACS vs Pneumothorax vs Reflux/Gastritis vs MSK pain vs Pneumonia vs other.  I ordered, reviewed, and interpreted labs, including BMP and CBC.  There were no immediate, life-threatening emergencies found in this labwork.  WBC elevated at 13.1. The patient's troponin level was 236 -> 420 Covid/flu negative. I ordered imaging studies which included dg chest, CT chest/abd/pelvis I independently visualized and interpreted imaging which showed likely bibasilar PNA and the monitor tracing which showed sinus tachycardia I personally reviewed the patients ECG which showed sinus rhythm with tachycardia, with no acute ischemic findings  Clinically this presentation is consistent with demand ischemia 2/2 bilateral PNA.  Will start on antibiotics, admit for echocardiogram.  Vitals otherwise stable - pt placed on 2L [place] but suspect chronic hypoxemia from smoking.  I discussed PE workup with  hospitalist given her iodine allergies - he will pursue V/Q scan and ultrasound as needed, stat echocardiogram, and initiate heparin if high clinical concern  Clinical Course as of 07/25/21 1719  Mon Jul 25, 2021  0717 X-rays are consistent with possible bilateral pneumonia, which supported by her tachycardia and leukocytosis.  I have ordered Rocephin and doxycycline for this.  Covid/flu are pending.  Trop is elevated at 236, will need repeat, ECG does not show STEMI, I suspect this may be demand ischemia.  Less likely PE - she is not truly hypoxic (O2 sat was 92% on room air, which would not be unusual for a chronic smoker). [MT]  1572 Troponin I (High Sensitivity)(!!): 236 [MT]  1052 Admitted to hospitalist who has ordered V/Q study for PE eval, and echocardiogram [MT]    Clinical Course User Index [MT] Wai Minotti, Carola Rhine, MD     Final Clinical Impression(s) / ED Diagnoses Pneumonia Elevated troponin  Rx / DC Orders ED Discharge Orders     None        Antigone Crowell, Carola Rhine, MD 07/25/21 [phone]

## 2021-07-25 NOTE — ED Triage Notes (Signed)
Pt arrived via [facility] w co SOB that started approx a week ago following an endoscopy which has gradually gotten worse til she felt she had to come this morning. Pt states it feels like her breath goes away each time that pain 1010 starts on her right side and radiates around her back to the Left side.

## 2021-07-25 NOTE — ED Notes (Signed)
Pt transported to [facility] 

## 2021-07-25 NOTE — ED Notes (Signed)
Pt returned from [facility] 

## 2021-07-25 NOTE — H&P (Signed)
Patient Demographics:    Cynthia Chavez, is a 65 y.o. female  MRN: [id number]   DOB - 1955-09-14  Admit Date - 07/25/2021  Outpatient Primary MD for the patient is Janora Norlander, DO   Assessment & Plan:    Principal Problem:   Bilateral pulmonary embolism and Rt Leg DVT - Rt Femoral, popliteal, posterior tibial and Peroneal veins Active Problems:   Rt Leg DVT - Rt Femoral, popliteal, posterior tibial and Peroneal veins   Hypothyroidism   1)Bilateral Pulmonary Embolism and Rt Leg DVT - Rt Femoral, popliteal, posterior tibial and Peroneal veins---- -CT chest abdomen and pelvis without contrast suggested possible pneumonia with effusions--- when put in clinical context this is probably more likely consistent with PE with pulmonary infarction -Echo with preserved EF of 55%, Right ventricular systolic function is severely reduced. The right ventricular size is severely enlarged.  And Right atrial size was moderately dilated. -IV heparin was ordered -Discussed with PCCM attending Dr. Halford Chessman -Discussed with IR team----should patient decompensate will need to premedicate for contrast allergy to do a CTA chest to determine if she would be a candidate for IR intervention----as per IR team  interventions cannot be done based on VQ scan that we need a CTA chest at that time -The patient and patient's 2 sisters at bedside at this time they would like to defer premedication with steroid and Benadryl for contrast study  2)Hypothyroidism--thyroid ultrasound from 05/10/2021 consistent with atrophic moderately heterogeneous appearing thyroid without discrete nodules, or masses,  findings suggestive of chronic thyroiditis  3)Tobacco Abuse and  bronchitis----treat empirically with bronchodilators, mucolytics, antitussives and  Rocephin -Smoking cessation advised -Nicotine patch advised  4)GERD/Hiatal Hernia--- CT chest, abdomen and pelvis showed moderate sized hiatal hernia  5)Dyspnea and hypoxia--- multifactorial in setting of bilateral PE, bronchitis and underlying right-sided pulmonary scarring from prior right breast cancer radiation -Treatment as above #3, -Supplemental oxygen  6)Elevated Troponin--- suspect due to demand ischemia in the setting of bilateral PE with hypoxia -Echo with preserved EF of 55% without regional wall motion abnormalities -Troponin 236>> 420>>311>>300 , EKG with Sinus tachycardia LAE, consider biatrial enlargement RSR' in V1 or V2, right VCD or RVH  7)Leukocytosis----WBC 13.1, most likely reactive due to #1 above, lactic acid is not elevated  8)Rt sided Breast Ca in 2018 --s/p prior Mastectomy, prior radiation --- c/n Tamoxifen  Disposition/Need for in-Hospital Stay- patient unable to be discharged at this time due to --- very symptomatic bilateral PE and extensive right leg DVT requiring IV heparin for at least 3 to 5 days prior to transition to oral anticoagulant  Status is: Inpatient  Remains inpatient appropriate because: Please see disposition above  Dispo: The patient is from: Home              Anticipated d/c is to: Home              Anticipated d/c date is: 2 days  Patient currently is not medically stable to d/c. Barriers: Not Clinically Stable-    With History of - Reviewed by me  Past Medical History:  Diagnosis Date   Anxiety    Breast cancer ([place])    Cancer ([place])    breast cancer   Depression    Edema    bilateral feet and leg swelling   GERD (gastroesophageal reflux disease)    History of hiatal hernia    History of radiation therapy 02/12/17- 03/26/17   Chest wall and regional nodes, right- 50 Gy in 25 fractions, Chest Wall, boost, right- 10 Gy in 5 fractions.    Hypothyroidism    Malaise    Personal history of chemotherapy    Personal  history of radiation therapy    Port catheter in place 09/08/2016   Sprain of shoulder, right 11/23/2020   Thyroid disease    Varicose veins       Past Surgical History:  Procedure Laterality Date   ABDOMINAL HYSTERECTOMY     AXILLARY LYMPH NODE BIOPSY Right 01/03/2017   Procedure: RIGHT AXILLARY LYMPH NODE SEED GUIDED EXCISIONAL BIOPSY;  Surgeon: Rolm Bookbinder, MD;  Location: [facility];  Service: General;  Laterality: Right;   BREAST SURGERY     CHOLECYSTECTOMY     COLONOSCOPY     ESOPHAGEAL DILATION     MASTECTOMY Right    MASTECTOMY W/ SENTINEL NODE BIOPSY Right 01/03/2017   axillary   MASTECTOMY W/ SENTINEL NODE BIOPSY Right 01/03/2017   Procedure: RIGHT TOTAL MASTECTOMY WITH RIGHT AXILLARY SENTINEL LYMPH NODE BIOPSY;  Surgeon: Rolm Bookbinder, MD;  Location: [facility];  Service: General;  Laterality: Right;   PORTACATH PLACEMENT Left 08/17/2016   Procedure: INSERTION PORT-A-CATH WITH [place];  Surgeon: Rolm Bookbinder, MD;  Location: [place] [facility];  Service: General;  Laterality: Left;    Chief Complaint  Patient presents with   Shortness of Breath      HPI:    Cynthia Chavez  is a 65 y.o. female smoker with PMHx relevant for Rt sided Breast Ca in 2018 (s/p prior Mastectomy, prior radiation and currently on Tamoxifen), hypothyroidism , GERD/Hiatal presented to ED with ongoing dyspnea on exertion, chest discomfort especially with inspiration consistent with pleuritic type chest pain over the last 6 to 9 days worse over the last 3 to 4 days -Patient apparently had laryngoscopy by ENT physician for hoarse voice couple weeks ago---she was given Pepcid she thinks she has a reaction to rate, she felt bad and she has never felt the same since -No dizziness no palpitations no productive cough no fevers no chills -No sick contacts at home -Patient complains of worsening dyspnea on exertion worsening chest discomfort with inspiration - Denies specific focal area of extremity pain but  she has generalized aches and pains in her legs CT chest abdomen and pelvis without contrast suggested possible pneumonia with effusions--- when put in clinical context this is probably more likely consistent with PE with pulmonary infarction -Lower extremity Dopplers consistent with extensive right leg DVT VQ scan consistent with bilateral PE WBC is 13.1, creatinine 0.91 ALT 51 Troponin 236>> 420>>311>>300 , EKG with Sinus tachycardia LAE, consider biatrial enlargement RSR' in V1 or V2, right VCD or RVH -IV heparin initiated in the ED  additional history obtained from patient's 2 sisters at bedside - In ED patient was tachycardic, tachypneic and transiently hypoxic   Review of systems:    In addition to the HPI above,   A full Review  of  Systems was done, all other systems reviewed are negative except as noted above in HPI , .    Social History:  Reviewed by me    Social History   Tobacco Use   Smoking status: Every Day    Packs/day: 0.50    Years: 25.00    Pack years: 12.50    Types: Cigarettes    Start date: 04/19/1993   Smokeless tobacco: Never   Tobacco comments:    she stopped smoking for a long time, but started back.   Substance Use Topics   Alcohol use: No       Family History :  Reviewed by me    Family History  Problem Relation Age of Onset   COPD Father    Cancer Father      Home Medications:   Prior to Admission medications   Medication Sig Start Date End Date Taking? Authorizing Provider  acetaminophen (TYLENOL) 500 MG tablet Take 1 tablet (500 mg total) by mouth every 6 (six) hours as needed. 11/23/20  Yes Ivy Lynn, NP  esomeprazole (NEXIUM) 20 MG capsule Take 20 mg by mouth daily at 12 noon.   Yes [provider]  levothyroxine (SYNTHROID) 150 MCG tablet TAKE 1 TABLET (150 MCG TOTAL) BY MOUTH DAILY BEFORE BREAKFAST. 12/08/20  Yes Hawks, Christy A, FNP  tamoxifen (NOLVADEX) 20 MG tablet TAKE 1/2 TABLET EVERY DAY 04/13/21  Yes Nicholas Lose, MD  betamethasone valerate ointment (VALISONE) 0.1 % Apply 1 application topically 2 (two) times daily. x10-14days per psoriatic flare Patient not taking: Reported on 07/25/2021 10/27/20   Janora Norlander, DO  Chlorphen-PE-Acetaminophen 4-10-325 MG TABS Take 1 tablet by mouth every 6 (six) hours as needed. Patient not taking: Reported on 07/25/2021 02/11/21   Chevis Pretty, FNP  methocarbamol (ROBAXIN) 500 MG tablet Take 1 tablet (500 mg total) by mouth 4 (four) times daily. Patient not taking: Reported on 07/25/2021 11/23/20   Ivy Lynn, NP  triamcinolone (NASACORT) 55 MCG/ACT AERO nasal inhaler Place 2 sprays into the nose daily. Patient not taking: Reported on 07/25/2021 02/11/21   Chevis Pretty, FNP     Allergies:     Allergies  Allergen Reactions   Beef-Derived Products Hives, Shortness Of Breath, Swelling and Rash   Ivp Dye [Iodinated Diagnostic Agents] Other (See Comments)    Chest pain   Other Shortness Of Breath, Swelling and Other (See Comments)    Versed or Fentanyl when used for endoscopy caused throat and tongue swelling   Augmentin [Amoxicillin-Pot Clavulanate]    Dilaudid [Hydromorphone Hcl]     PT had CP and felt shaky. Didn't like the feeling, but sx lessened w/in 10 min of IV administration   Codeine Other (See Comments)    spasms     Physical Exam:   Vitals  Blood pressure 115/90, pulse 95, temperature 98.8 F (37.1 C), temperature source Oral, resp. rate 19, height 5\' 7"  (1.702 m), weight 74 kg, SpO2 97 %.  Physical Examination: General appearance - alert, and dyspnea on exertion Mental status - alert, oriented to person, place, and time,  Eyes - sclera anicteric Nose- [place] 2L/min Neck - supple, no JVD elevation , Chest -diminished breath sounds, few scattered rales, right more than left no wheezing  heart - S1 and S2 normal, regular , tachycardic Abdomen - soft, nontender, nondistended, no masses or organomegaly Neurological -  screening mental status exam normal, neck supple without rigidity, cranial nerves II through XII intact, DTR's  normal and symmetric Extremities -right lower extremity tenderness, swelling and warmth with prominent venous distention over the medial aspect of the right thigh,  intact peripheral pulses  Skin - warm, dry     Data Review:    CBC Recent Labs  Lab 07/25/21 0717  WBC 13.1*  HGB 14.5  HCT 44.3  PLT 205  MCV 97.8  MCH 32.0  MCHC 32.7  RDW 13.0  LYMPHSABS 0.7  MONOABS 0.4  EOSABS 0.1  BASOSABS 0.0   ------------------------------------------------------------------------------------------------------------------  Chemistries  Recent Labs  Lab 07/25/21 0717  NA 136  K 3.8  CL 103  CO2 22  GLUCOSE 141*  BUN 15  CREATININE 0.91  CALCIUM 8.6*  AST 41  ALT 51*  ALKPHOS 73  BILITOT 0.9   ------------------------------------------------------------------------------------------------------------------ estimated creatinine clearance is 63.1 mL/min (by C-G formula based on SCr of 0.91 mg/dL). ------------------------------------------------------------------------------------------------------------------ No results for input(s): TSH, T4TOTAL, T3FREE, THYROIDAB in the last 72 hours.  Invalid input(s): FREET3   Coagulation profile No results for input(s): INR, PROTIME in the last 168 hours. ------------------------------------------------------------------------------------------------------------------- No results for input(s): DDIMER in the last 72 hours. -------------------------------------------------------------------------------------------------------------------  Cardiac Enzymes No results for input(s): CKMB, TROPONINI, MYOGLOBIN in the last 168 hours.  Invalid input(s): CK ------------------------------------------------------------------------------------------------------------------ No results found for:  BNP   ---------------------------------------------------------------------------------------------------------------  Urinalysis    Component Value Date/Time   COLORURINE YELLOW 01/25/2014 2128   APPEARANCEUR Clear 09/04/2016 1019   LABSPEC 1.005 03/12/2017 1538   PHURINE 6.0 03/12/2017 1538   PHURINE 7.5 01/25/2014 2128   GLUCOSEU Negative 03/12/2017 1538   HGBUR Negative 03/12/2017 1538   HGBUR NEGATIVE 01/25/2014 2128   BILIRUBINUR Negative 03/12/2017 1538   KETONESUR Negative 03/12/2017 1538   KETONESUR NEGATIVE 01/25/2014 2128   PROTEINUR Negative 03/12/2017 1538   PROTEINUR NEGATIVE 01/25/2014 2128   UROBILINOGEN 0.2 03/12/2017 1538   NITRITE Negative 03/12/2017 1538   NITRITE NEGATIVE 01/25/2014 2128   LEUKOCYTESUR Small 03/12/2017 1538    ----------------------------------------------------------------------------------------------------------------   Imaging Results:    NM Pulmonary Perfusion  Result Date: 07/25/2021 CLINICAL DATA:  Chest pain, recent shortness of breath. Right lower extremity DVT. EXAM: NUCLEAR MEDICINE PERFUSION LUNG SCAN TECHNIQUE: Perfusion images were obtained in multiple projections after intravenous injection of radiopharmaceutical. Ventilation scans intentionally deferred if perfusion scan and chest x-ray adequate for interpretation during COVID 19 epidemic. RADIOPHARMACEUTICALS:  4.11 mCi Tc-73m MAA IV COMPARISON:  CT from earlier the same day, which demonstrates small effusions with airspace disease posteriorly at both lung bases. FINDINGS: There are numerous marked perfusion defects in both upper lobes right worse than left, a large defect involving most of the left lower lung, and smaller defects in the left upper lung and laterally in the right mid lung. IMPRESSION: Multiple perfusion defects out of proportion to CT findings, suggesting bilateral pulmonary emboli. Electronically Signed   By: Lucrezia Europe M.D.   On: 07/25/2021 12:50   US Venous  Img Lower Bilateral (DVT)  Result Date: 07/25/2021 CLINICAL DATA:  Pulmonary emboli (HCC) I26.99 (ICD-10-CM) EXAM: BILATERAL LOWER EXTREMITY VENOUS DOPPLER ULTRASOUND TECHNIQUE: Gray-scale sonography with graded compression, as well as color Doppler and duplex ultrasound were performed to evaluate the lower extremity deep venous systems from the level of the common femoral vein and including the common femoral, femoral, profunda femoral, popliteal and calf veins including the posterior tibial, peroneal and gastrocnemius veins when visible. The superficial great saphenous vein was also interrogated. Spectral Doppler was utilized to evaluate flow at rest and with distal augmentation maneuvers  in the common femoral, femoral and popliteal veins. COMPARISON:  None. FINDINGS: RIGHT LOWER EXTREMITY Common Femoral Vein: No evidence of thrombus. Normal compressibility, respiratory phasicity and response to augmentation. Saphenofemoral Junction: No evidence of thrombus. Normal compressibility and flow on color Doppler imaging. Profunda Femoral Vein: No evidence of thrombus. Normal compressibility and flow on color Doppler imaging. Femoral Vein: Visible thrombus within the femoral vein which is noncompressible. Popliteal Vein: Visible thrombus within the popliteal vein, which is noncompressible. Calf Veins: Visible thrombus within the posterior tibial vein and peroneal vein, which are noncompressible. Superficial Great Saphenous Vein: No evidence of thrombus. Normal compressibility. Venous Reflux:  None. Other Findings:  None. LEFT LOWER EXTREMITY Common Femoral Vein: No evidence of thrombus. Normal compressibility, respiratory phasicity and response to augmentation. Saphenofemoral Junction: No evidence of thrombus. Normal compressibility and flow on color Doppler imaging. Profunda Femoral Vein: No evidence of thrombus. Normal compressibility and flow on color Doppler imaging. Femoral Vein: No evidence of thrombus. Normal  compressibility, respiratory phasicity and response to augmentation. Popliteal Vein: No evidence of thrombus. Normal compressibility, respiratory phasicity and response to augmentation. Calf Veins: No evidence of thrombus. Normal compressibility and flow on color Doppler imaging. Superficial Great Saphenous Vein: No evidence of thrombus. Normal compressibility. IMPRESSION: Positive for deep venous thrombosis involving the right femoral, popliteal, posterior tibial, and peroneal veins. Findings discussed with Dr. Satira Anis via telephone at 11:50 a.m. Electronically Signed   By: Margaretha Sheffield M.D.   On: 07/25/2021 11:53   ECHOCARDIOGRAM COMPLETE  Result Date: 07/25/2021    ECHOCARDIOGRAM REPORT   Patient Name:   Cynthia Chavez Date of Exam: 07/25/2021 Medical Rec #:  [id number]   Height:       67.0 in Accession #:    [id number]  Weight:       163.1 lb Date of Birth:  [birth year]   BSA:          1.855 m Patient Age:    89 years    BP:           116/82 mmHg Patient Gender: F           HR:           98 bpm. Exam Location:  [facility] Procedure: 2D Echo, Cardiac Doppler and Color Doppler Indications:    Dyspnea  History:        Patient has prior history of Echocardiogram examinations, most                 recent 05/17/2017. Breast CA, Pulmonary Embolus.  Sonographer:    Wenda Low Referring Phys: [place]  1. Left ventricular ejection fraction, by estimation, is 55%. The left ventricle has normal function. The left ventricle has no regional wall motion abnormalities. There is mild left ventricular hypertrophy. Left ventricular diastolic parameters were normal.  2. Right ventricular systolic function is severely reduced. The right ventricular size is severely enlarged.  3. Right atrial size was moderately dilated.  4. The pericardial effusion is posterior to the left ventricle.  5. The mitral valve is abnormal. Trivial mitral valve regurgitation. No evidence of mitral stenosis. Moderate  mitral annular calcification.  6. The aortic valve is tricuspid. There is moderate calcification of the aortic valve. Aortic valve regurgitation is not visualized. Aortic valve sclerosis/calcification is present, without any evidence of aortic stenosis.  7. The inferior vena cava is normal in size with greater than 50% respiratory variability, suggesting right atrial pressure of 3 mmHg. FINDINGS  Left  Ventricle: Left ventricular ejection fraction, by estimation, is 55%. The left ventricle has normal function. The left ventricle has no regional wall motion abnormalities. The left ventricular internal cavity size was normal in size. There is mild left ventricular hypertrophy. Left ventricular diastolic parameters were normal. Right Ventricle: The right ventricular size is severely enlarged. Right vetricular wall thickness was not assessed. Right ventricular systolic function is severely reduced. Left Atrium: Left atrial size was normal in size. Right Atrium: Right atrial size was moderately dilated. Pericardium: Trivial pericardial effusion is present. The pericardial effusion is posterior to the left ventricle. Mitral Valve: The mitral valve is abnormal. There is mild thickening of the mitral valve leaflet(s). There is mild calcification of the mitral valve leaflet(s). Moderate mitral annular calcification. Trivial mitral valve regurgitation. No evidence of mitral valve stenosis. MV peak gradient, 3.5 mmHg. The mean mitral valve gradient is 1.0 mmHg. Tricuspid Valve: The tricuspid valve is normal in structure. Tricuspid valve regurgitation is mild . No evidence of tricuspid stenosis. Aortic Valve: The aortic valve is tricuspid. There is moderate calcification of the aortic valve. Aortic valve regurgitation is not visualized. Aortic valve sclerosis/calcification is present, without any evidence of aortic stenosis. Aortic valve mean gradient measures 4.0 mmHg. Aortic valve peak gradient measures 6.9 mmHg. Aortic valve  area, by VTI measures 1.91 cm. Pulmonic Valve: The pulmonic valve was normal in structure. Pulmonic valve regurgitation is not visualized. No evidence of pulmonic stenosis. Aorta: The aortic root is normal in size and structure. Venous: The inferior vena cava is normal in size with greater than 50% respiratory variability, suggesting right atrial pressure of 3 mmHg. IAS/Shunts: No atrial level shunt detected by color flow Doppler.  LEFT VENTRICLE PLAX 2D LVIDd:         3.60 cm     Diastology LVIDs:         2.30 cm     LV e' medial:    6.31 cm/s LV PW:         1.20 cm     LV E/e' medial:  13.4 LV IVS:        1.30 cm     LV e' lateral:   9.46 cm/s LVOT diam:     1.90 cm     LV E/e' lateral: 8.9 LV SV:         43 LV SV Index:   23 LVOT Area:     2.84 cm  LV Volumes (MOD) LV vol d, MOD A2C: 50.1 ml LV vol d, MOD A4C: 49.5 ml LV vol s, MOD A2C: 26.6 ml LV vol s, MOD A4C: 22.7 ml LV SV MOD A2C:     23.5 ml LV SV MOD A4C:     49.5 ml LV SV MOD BP:      26.5 ml RIGHT VENTRICLE RV Basal diam:  4.90 cm RV Mid diam:    4.30 cm RV S prime:     8.81 cm/s TAPSE (M-mode): 1.8 cm LEFT ATRIUM             Index        RIGHT ATRIUM           Index LA diam:        3.70 cm 2.00 cm/m   RA Area:     20.50 cm LA Vol (A2C):   38.9 ml 20.98 ml/m  RA Volume:   59.70 ml  32.19 ml/m LA Vol (A4C):   36.5 ml 19.68 ml/m LA Biplane Vol: 39.7  ml 21.41 ml/m  AORTIC VALVE                    PULMONIC VALVE AV Area (Vmax):    1.65 cm     PV Vmax:       0.70 m/s AV Area (Vmean):   1.87 cm     PV Peak grad:  2.0 mmHg AV Area (VTI):     1.91 cm AV Vmax:           131.00 cm/s AV Vmean:          87.200 cm/s AV VTI:            0.223 m AV Peak Grad:      6.9 mmHg AV Mean Grad:      4.0 mmHg LVOT Vmax:         76.40 cm/s LVOT Vmean:        57.400 cm/s LVOT VTI:          0.150 m LVOT/AV VTI ratio: 0.67  AORTA Ao Root diam: 3.10 cm Ao Asc diam:  3.00 cm MITRAL VALVE MV Area (PHT): 4.29 cm     SHUNTS MV Area VTI:   2.49 cm     Systemic VTI:  0.15 m MV  Peak grad:  3.5 mmHg     Systemic Diam: 1.90 cm MV Mean grad:  1.0 mmHg MV Vmax:       0.94 m/s MV Vmean:      56.3 cm/s MV Decel Time: 177 msec MV E velocity: 84.40 cm/s MV A velocity: 102.00 cm/s MV E/A ratio:  0.83 Jenkins Rouge MD Electronically signed by Jenkins Rouge MD Signature Date/Time: 07/25/2021/5:07:24 PM    Final    CT CHEST ABDOMEN PELVIS WO CONTRAST  Result Date: 07/25/2021 CLINICAL DATA:  Left-sided abdominal pain radiating in that region. Remote history of breast cancer on the right. Poly trauma. Status post endoscopy 2 weeks ago. Worsening epigastric pain, shortness of breath and abdominal tenderness. Question pneumonia or GI perforation. EXAM: CT CHEST, ABDOMEN AND PELVIS WITHOUT CONTRAST TECHNIQUE: Multidetector CT imaging of the chest, abdomen and pelvis was performed following the standard protocol without IV contrast. COMPARISON:  Chest CT 08/29/2019.  Abdominal CT 08/17/2016 FINDINGS: CT CHEST FINDINGS Cardiovascular: Heart size is normal. There is aortic atherosclerotic calcification. No pericardial effusion. Mediastinum/Nodes: Large hiatal hernia. No sign of mediastinal or hilar mass or lymphadenopathy. Lungs/Pleura: Pleural effusion on the right layering dependently. Very tiny amount of pleural fluid on the left. Dependent atelectasis, more on the right than the left. Patchy basilar pneumonia right more than left not excluded. Chronic parenchymal scarring on the right related to previous radiation. Musculoskeletal: No evidence of spinal fracture. No evidence of rib fracture or focal rib lesion. CT ABDOMEN PELVIS FINDINGS Hepatobiliary: Previous cholecystectomy. Liver parenchyma is normal. Pancreas: Normal Spleen: Normal Adrenals/Urinary Tract: Adrenal glands are normal. Kidneys are normal. No cyst, mass, stone or hydronephrosis. Bladder is normal. Stomach/Bowel: Hiatal hernia as noted above. No other bowel finding. No sign of perforation or inflammatory change. Vascular/Lymphatic:  Aortic atherosclerosis. No aneurysm. IVC is normal. No adenopathy. Reproductive: Previous hysterectomy.  No pelvic mass. Other: No free fluid or air. Musculoskeletal: No significant bone finding. IMPRESSION: Pleural effusions layering dependently, larger on the right than the left, with dependent pulmonary density, right more than left. Basilar pneumonia is possible, particularly on the right. Moderate size hiatal hernia. Pulmonary scarring on the right related to radiation therapy. Aortic Atherosclerosis (ICD10-I70.0). Previous cholecystectomy and  hysterectomy. No sign of acute complication related to the previous endoscopy. Electronically Signed   By: Nelson Chimes M.D.   On: 07/25/2021 08:07    Radiological Exams on Admission: NM Pulmonary Perfusion  Result Date: 07/25/2021 CLINICAL DATA:  Chest pain, recent shortness of breath. Right lower extremity DVT. EXAM: NUCLEAR MEDICINE PERFUSION LUNG SCAN TECHNIQUE: Perfusion images were obtained in multiple projections after intravenous injection of radiopharmaceutical. Ventilation scans intentionally deferred if perfusion scan and chest x-ray adequate for interpretation during COVID 19 epidemic. RADIOPHARMACEUTICALS:  4.11 mCi Tc-83m MAA IV COMPARISON:  CT from earlier the same day, which demonstrates small effusions with airspace disease posteriorly at both lung bases. FINDINGS: There are numerous marked perfusion defects in both upper lobes right worse than left, a large defect involving most of the left lower lung, and smaller defects in the left upper lung and laterally in the right mid lung. IMPRESSION: Multiple perfusion defects out of proportion to CT findings, suggesting bilateral pulmonary emboli. Electronically Signed   By: Lucrezia Europe M.D.   On: 07/25/2021 12:50   US Venous Img Lower Bilateral (DVT)  Result Date: 07/25/2021 CLINICAL DATA:  Pulmonary emboli (HCC) I26.99 (ICD-10-CM) EXAM: BILATERAL LOWER EXTREMITY VENOUS DOPPLER ULTRASOUND TECHNIQUE:  Gray-scale sonography with graded compression, as well as color Doppler and duplex ultrasound were performed to evaluate the lower extremity deep venous systems from the level of the common femoral vein and including the common femoral, femoral, profunda femoral, popliteal and calf veins including the posterior tibial, peroneal and gastrocnemius veins when visible. The superficial great saphenous vein was also interrogated. Spectral Doppler was utilized to evaluate flow at rest and with distal augmentation maneuvers in the common femoral, femoral and popliteal veins. COMPARISON:  None. FINDINGS: RIGHT LOWER EXTREMITY Common Femoral Vein: No evidence of thrombus. Normal compressibility, respiratory phasicity and response to augmentation. Saphenofemoral Junction: No evidence of thrombus. Normal compressibility and flow on color Doppler imaging. Profunda Femoral Vein: No evidence of thrombus. Normal compressibility and flow on color Doppler imaging. Femoral Vein: Visible thrombus within the femoral vein which is noncompressible. Popliteal Vein: Visible thrombus within the popliteal vein, which is noncompressible. Calf Veins: Visible thrombus within the posterior tibial vein and peroneal vein, which are noncompressible. Superficial Great Saphenous Vein: No evidence of thrombus. Normal compressibility. Venous Reflux:  None. Other Findings:  None. LEFT LOWER EXTREMITY Common Femoral Vein: No evidence of thrombus. Normal compressibility, respiratory phasicity and response to augmentation. Saphenofemoral Junction: No evidence of thrombus. Normal compressibility and flow on color Doppler imaging. Profunda Femoral Vein: No evidence of thrombus. Normal compressibility and flow on color Doppler imaging. Femoral Vein: No evidence of thrombus. Normal compressibility, respiratory phasicity and response to augmentation. Popliteal Vein: No evidence of thrombus. Normal compressibility, respiratory phasicity and response to  augmentation. Calf Veins: No evidence of thrombus. Normal compressibility and flow on color Doppler imaging. Superficial Great Saphenous Vein: No evidence of thrombus. Normal compressibility. IMPRESSION: Positive for deep venous thrombosis involving the right femoral, popliteal, posterior tibial, and peroneal veins. Findings discussed with Dr. Satira Anis via telephone at 11:50 a.m. Electronically Signed   By: Margaretha Sheffield M.D.   On: 07/25/2021 11:53   ECHOCARDIOGRAM COMPLETE  Result Date: 07/25/2021    ECHOCARDIOGRAM REPORT   Patient Name:   Cynthia Chavez Date of Exam: 07/25/2021 Medical Rec #:  [id number]   Height:       67.0 in Accession #:    [id number]  Weight:       163.1 lb Date of  Birth:  1956-07-17   BSA:          1.855 m Patient Age:    56 years    BP:           116/82 mmHg Patient Gender: F           HR:           98 bpm. Exam Location:  [facility] Procedure: 2D Echo, Cardiac Doppler and Color Doppler Indications:    Dyspnea  History:        Patient has prior history of Echocardiogram examinations, most                 recent 05/17/2017. Breast CA, Pulmonary Embolus.  Sonographer:    Wenda Low Referring Phys: [place]  1. Left ventricular ejection fraction, by estimation, is 55%. The left ventricle has normal function. The left ventricle has no regional wall motion abnormalities. There is mild left ventricular hypertrophy. Left ventricular diastolic parameters were normal.  2. Right ventricular systolic function is severely reduced. The right ventricular size is severely enlarged.  3. Right atrial size was moderately dilated.  4. The pericardial effusion is posterior to the left ventricle.  5. The mitral valve is abnormal. Trivial mitral valve regurgitation. No evidence of mitral stenosis. Moderate mitral annular calcification.  6. The aortic valve is tricuspid. There is moderate calcification of the aortic valve. Aortic valve regurgitation is not visualized. Aortic  valve sclerosis/calcification is present, without any evidence of aortic stenosis.  7. The inferior vena cava is normal in size with greater than 50% respiratory variability, suggesting right atrial pressure of 3 mmHg. FINDINGS  Left Ventricle: Left ventricular ejection fraction, by estimation, is 55%. The left ventricle has normal function. The left ventricle has no regional wall motion abnormalities. The left ventricular internal cavity size was normal in size. There is mild left ventricular hypertrophy. Left ventricular diastolic parameters were normal. Right Ventricle: The right ventricular size is severely enlarged. Right vetricular wall thickness was not assessed. Right ventricular systolic function is severely reduced. Left Atrium: Left atrial size was normal in size. Right Atrium: Right atrial size was moderately dilated. Pericardium: Trivial pericardial effusion is present. The pericardial effusion is posterior to the left ventricle. Mitral Valve: The mitral valve is abnormal. There is mild thickening of the mitral valve leaflet(s). There is mild calcification of the mitral valve leaflet(s). Moderate mitral annular calcification. Trivial mitral valve regurgitation. No evidence of mitral valve stenosis. MV peak gradient, 3.5 mmHg. The mean mitral valve gradient is 1.0 mmHg. Tricuspid Valve: The tricuspid valve is normal in structure. Tricuspid valve regurgitation is mild . No evidence of tricuspid stenosis. Aortic Valve: The aortic valve is tricuspid. There is moderate calcification of the aortic valve. Aortic valve regurgitation is not visualized. Aortic valve sclerosis/calcification is present, without any evidence of aortic stenosis. Aortic valve mean gradient measures 4.0 mmHg. Aortic valve peak gradient measures 6.9 mmHg. Aortic valve area, by VTI measures 1.91 cm. Pulmonic Valve: The pulmonic valve was normal in structure. Pulmonic valve regurgitation is not visualized. No evidence of pulmonic  stenosis. Aorta: The aortic root is normal in size and structure. Venous: The inferior vena cava is normal in size with greater than 50% respiratory variability, suggesting right atrial pressure of 3 mmHg. IAS/Shunts: No atrial level shunt detected by color flow Doppler.  LEFT VENTRICLE PLAX 2D LVIDd:         3.60 cm     Diastology LVIDs:  2.30 cm     LV e' medial:    6.31 cm/s LV PW:         1.20 cm     LV E/e' medial:  13.4 LV IVS:        1.30 cm     LV e' lateral:   9.46 cm/s LVOT diam:     1.90 cm     LV E/e' lateral: 8.9 LV SV:         43 LV SV Index:   23 LVOT Area:     2.84 cm  LV Volumes (MOD) LV vol d, MOD A2C: 50.1 ml LV vol d, MOD A4C: 49.5 ml LV vol s, MOD A2C: 26.6 ml LV vol s, MOD A4C: 22.7 ml LV SV MOD A2C:     23.5 ml LV SV MOD A4C:     49.5 ml LV SV MOD BP:      26.5 ml RIGHT VENTRICLE RV Basal diam:  4.90 cm RV Mid diam:    4.30 cm RV S prime:     8.81 cm/s TAPSE (M-mode): 1.8 cm LEFT ATRIUM             Index        RIGHT ATRIUM           Index LA diam:        3.70 cm 2.00 cm/m   RA Area:     20.50 cm LA Vol (A2C):   38.9 ml 20.98 ml/m  RA Volume:   59.70 ml  32.19 ml/m LA Vol (A4C):   36.5 ml 19.68 ml/m LA Biplane Vol: 39.7 ml 21.41 ml/m  AORTIC VALVE                    PULMONIC VALVE AV Area (Vmax):    1.65 cm     PV Vmax:       0.70 m/s AV Area (Vmean):   1.87 cm     PV Peak grad:  2.0 mmHg AV Area (VTI):     1.91 cm AV Vmax:           131.00 cm/s AV Vmean:          87.200 cm/s AV VTI:            0.223 m AV Peak Grad:      6.9 mmHg AV Mean Grad:      4.0 mmHg LVOT Vmax:         76.40 cm/s LVOT Vmean:        57.400 cm/s LVOT VTI:          0.150 m LVOT/AV VTI ratio: 0.67  AORTA Ao Root diam: 3.10 cm Ao Asc diam:  3.00 cm MITRAL VALVE MV Area (PHT): 4.29 cm     SHUNTS MV Area VTI:   2.49 cm     Systemic VTI:  0.15 m MV Peak grad:  3.5 mmHg     Systemic Diam: 1.90 cm MV Mean grad:  1.0 mmHg MV Vmax:       0.94 m/s MV Vmean:      56.3 cm/s MV Decel Time: 177 msec MV E velocity:  84.40 cm/s MV A velocity: 102.00 cm/s MV E/A ratio:  0.83 Jenkins Rouge MD Electronically signed by Jenkins Rouge MD Signature Date/Time: 07/25/2021/5:07:24 PM    Final    CT CHEST ABDOMEN PELVIS WO CONTRAST  Result Date: 07/25/2021 CLINICAL DATA:  Left-sided abdominal pain radiating in that region. Remote history  of breast cancer on the right. Poly trauma. Status post endoscopy 2 weeks ago. Worsening epigastric pain, shortness of breath and abdominal tenderness. Question pneumonia or GI perforation. EXAM: CT CHEST, ABDOMEN AND PELVIS WITHOUT CONTRAST TECHNIQUE: Multidetector CT imaging of the chest, abdomen and pelvis was performed following the standard protocol without IV contrast. COMPARISON:  Chest CT 08/29/2019.  Abdominal CT 08/17/2016 FINDINGS: CT CHEST FINDINGS Cardiovascular: Heart size is normal. There is aortic atherosclerotic calcification. No pericardial effusion. Mediastinum/Nodes: Large hiatal hernia. No sign of mediastinal or hilar mass or lymphadenopathy. Lungs/Pleura: Pleural effusion on the right layering dependently. Very tiny amount of pleural fluid on the left. Dependent atelectasis, more on the right than the left. Patchy basilar pneumonia right more than left not excluded. Chronic parenchymal scarring on the right related to previous radiation. Musculoskeletal: No evidence of spinal fracture. No evidence of rib fracture or focal rib lesion. CT ABDOMEN PELVIS FINDINGS Hepatobiliary: Previous cholecystectomy. Liver parenchyma is normal. Pancreas: Normal Spleen: Normal Adrenals/Urinary Tract: Adrenal glands are normal. Kidneys are normal. No cyst, mass, stone or hydronephrosis. Bladder is normal. Stomach/Bowel: Hiatal hernia as noted above. No other bowel finding. No sign of perforation or inflammatory change. Vascular/Lymphatic: Aortic atherosclerosis. No aneurysm. IVC is normal. No adenopathy. Reproductive: Previous hysterectomy.  No pelvic mass. Other: No free fluid or air.  Musculoskeletal: No significant bone finding. IMPRESSION: Pleural effusions layering dependently, larger on the right than the left, with dependent pulmonary density, right more than left. Basilar pneumonia is possible, particularly on the right. Moderate size hiatal hernia. Pulmonary scarring on the right related to radiation therapy. Aortic Atherosclerosis (ICD10-I70.0). Previous cholecystectomy and hysterectomy. No sign of acute complication related to the previous endoscopy. Electronically Signed   By: Nelson Chimes M.D.   On: 07/25/2021 08:07    DVT Prophylaxis- Iv Heparin AM Labs Ordered, also please review Full Orders  Family Communication: Admission, patients condition and plan of care including tests being ordered have been discussed with the patient and sisters x 2 who indicate understanding and agree with the plan   Code Status - Full Code  Likely DC to home  Condition   stable Roxan Hockey M.D on 07/25/2021 at 5:41 PM Go to [email or web address] -  for contact info  [facility] - Office  [phone]

## 2021-07-25 NOTE — ED Notes (Signed)
Patient transported to CT 

## 2021-07-25 NOTE — Progress Notes (Signed)
*  PRELIMINARY RESULTS* Echocardiogram 2D Echocardiogram has been performed.  Cynthia Chavez 07/25/2021, 2:28 PM

## 2021-07-26 DIAGNOSIS — J9601 Acute respiratory failure with hypoxia: Secondary | ICD-10-CM | POA: Diagnosis present

## 2021-07-26 LAB — BASIC METABOLIC PANEL
Anion gap: 9 (ref 5–15)
BUN: 14 mg/dL (ref 8–23)
CO2: 23 mmol/L (ref 22–32)
Calcium: 8.5 mg/dL — ABNORMAL LOW (ref 8.9–10.3)
Chloride: 106 mmol/L (ref 98–111)
Creatinine, Ser: 0.64 mg/dL (ref 0.44–1.00)
GFR, Estimated: >60 mL/min (ref >60)
Glucose, Bld: 102 mg/dL — ABNORMAL HIGH (ref 70–99)
Potassium: 4.2 mmol/L (ref 3.5–5.1)
Sodium: 138 mmol/L (ref 135–145)

## 2021-07-26 LAB — CBC
HCT: 38.6 % (ref 36.0–46.0)
Hemoglobin: 12.4 g/dL (ref 12.0–15.0)
MCH: 31.6 pg (ref 26.0–34.0)
MCHC: 32.1 g/dL (ref 30.0–36.0)
MCV: 98.5 fL (ref 80.0–100.0)
Platelets: 192 10*3/uL (ref 150–400)
RBC: 3.92 MIL/uL (ref 3.87–5.11)
RDW: 12.9 % (ref 11.5–15.5)
WBC: 11 10*3/uL — ABNORMAL HIGH (ref 4.0–10.5)
nRBC: 0 % (ref 0.0–0.2)

## 2021-07-26 LAB — HIV ANTIBODY (ROUTINE TESTING W REFLEX): HIV Screen 4th Generation wRfx: NONREACTIVE

## 2021-07-26 LAB — HEPARIN LEVEL (UNFRACTIONATED)
Heparin Unfractionated: 0.19 IU/mL — ABNORMAL LOW (ref 0.30–0.70)
Heparin Unfractionated: 0.26 IU/mL — ABNORMAL LOW (ref 0.30–0.70)
Heparin Unfractionated: 0.41 IU/mL (ref 0.30–0.70)

## 2021-07-26 MED ORDER — NICOTINE 14 MG/24HR TD PT24
14.0000 mg | MEDICATED_PATCH | Freq: Every day | TRANSDERMAL | Status: DC
  Filled 2021-07-26 (×2): qty 1

## 2021-07-26 MED ORDER — TAMSULOSIN HCL 0.4 MG PO CAPS
0.4000 mg | ORAL_CAPSULE | Freq: Two times a day (BID) | ORAL | Status: DC
  Administered 2021-07-26 – 2021-07-28 (×5): 0.4 mg via ORAL
  Filled 2021-07-26 (×5): qty 1

## 2021-07-26 MED ORDER — HEPARIN BOLUS VIA INFUSION
2000.0000 [IU] | Freq: Once | INTRAVENOUS | Status: AC
  Administered 2021-07-26: 09:00:00 2000 [IU] via INTRAVENOUS
  Filled 2021-07-26: qty 2000

## 2021-07-26 MED ORDER — HEPARIN BOLUS VIA INFUSION
2000.0000 [IU] | Freq: Once | INTRAVENOUS | Status: AC
  Administered 2021-07-26: 17:00:00 2000 [IU] via INTRAVENOUS
  Filled 2021-07-26: qty 2000

## 2021-07-26 NOTE — Progress Notes (Signed)
Patient still unable to void at this time.  Patient encouraged to drink fluids.  Voiding trial to end at 0800.

## 2021-07-26 NOTE — Progress Notes (Signed)
ANTICOAGULATION CONSULT NOTE Pharmacy Consult for heparin Indication: DVT/PE Brief A/P: Heparin level within goal range Continue Heparin at current rate   Allergies  Allergen Reactions   Beef-Derived Products Hives, Shortness Of Breath, Swelling and Rash   Ivp Dye [Iodinated Diagnostic Agents] Other (See Comments)    Chest pain   Other Shortness Of Breath, Swelling and Other (See Comments)    Versed or Fentanyl when used for endoscopy caused throat and tongue swelling   Augmentin [Amoxicillin-Pot Clavulanate]    Dilaudid [Hydromorphone Hcl]     PT had CP and felt shaky. Didn't like the feeling, but sx lessened w/in 10 min of IV administration   Codeine Other (See Comments)    spasms    Patient Measurements: Height: 5\' 7"  (170.2 cm) Weight: 74 kg (163 lb 2.3 oz) IBW/kg (Calculated) : 61.6 Heparin Dosing Weight: 67kg  Vital Signs: Temp: 98 F (36.7 C) (12/20 2053) BP: 118/72 (12/20 2053) Pulse Rate: 86 (12/20 2053)  Labs: Recent Labs    07/25/21 0717 07/25/21 0910 07/25/21 1435 07/25/21 1612 07/25/21 1855 07/26/21 0632 07/26/21 1526 07/26/21 2225  HGB 14.5  --   --   --   --  12.4  --   --   HCT 44.3  --   --   --   --  38.6  --   --   PLT 205  --   --   --   --  192  --   --   HEPARINUNFRC  --   --   --   --    < > 0.19* 0.26* 0.41  CREATININE 0.91  --   --   --   --  0.64  --   --   TROPONINIHS 236* 420* 311* 300*  --   --   --   --    < > = values in this interval not displayed.    Estimated Creatinine Clearance: 71.7 mL/min (by C-G formula based on SCr of 0.64 mg/dL).   Assessment: 65 y.o. female with RLE DVT and Bilateral PE for heparin  Goal of Therapy:  Heparin level 0.3-0.7 units/ml Monitor platelets by anticoagulation protocol: Yes   Plan:  Continue Heparin at current rate   Phillis Knack, PharmD, BCPS  07/26/2021 11:11 PM

## 2021-07-26 NOTE — Progress Notes (Signed)
PROGRESS NOTE     Cynthia Chavez, is a 65 y.o. female, DOB - June 16, 1956, OMV:[id number]  Admit date - 07/25/2021   Admitting Physician Gerold Sar Denton Brick, MD  Outpatient Primary MD for the patient is Janora Norlander, DO  LOS - 1  Chief Complaint  Patient presents with   Shortness of Breath        Brief Narrative:   66 y.o. female smoker with PMHx relevant for Rt sided Breast Ca in 2018 (s/p prior Mastectomy, prior radiation and currently on Tamoxifen), hypothyroidism , GERD/Hiatal presented to ED with ongoing dyspnea on exertion, chest discomfort especially with inspiration consistent with pleuritic type chest pain over the last 6 to 9 days worse over the last 3 to 4 days--work-up is consistent with bilateral PE and right leg DVT--- clinically and by echo suggest right heart strain--- --Anticipate patient require IV heparin for 3 to 5 days prior to transition to oral anticoagulant -No CTA chest due to contrast allergy  Assessment & Plan:   Principal Problem:   Bilateral pulmonary embolism and Rt Leg DVT - Rt Femoral, popliteal, posterior tibial and Peroneal veins Active Problems:   Rt Leg DVT - Rt Femoral, popliteal, posterior tibial and Peroneal veins   Acute respiratory failure with hypoxia (HCC)   Hypothyroidism   1)Bilateral Pulmonary Embolism and Rt Leg DVT -  --Rt Femoral, popliteal, posterior tibial and Peroneal veins---- -CT chest abdomen and pelvis without contrast suggested possible pneumonia with effusions--- when put in clinical context this is probably more likely consistent with PE with pulmonary infarction, rather than pneumonia--no CTA chest due to contrast allergy -Echo with preserved EF of 55%, Right ventricular systolic function is severely reduced. The right ventricular size is severely enlarged.  And Right atrial size was moderately dilated. -Continue IV heparin--started on 07/25/2021 -Discussed with PCCM attending Dr. Halford Chessman -Discussed with IR team----should  patient decompensate will need to premedicate for contrast allergy to do a CTA chest to determine if she would be a candidate for IR intervention----as per IR team  interventions cannot be done based on VQ scan  ---The patient and patient's 2 sisters at bedside at this time they would like to defer premedication with steroid and Benadryl for contrast study   2)Hypothyroidism--thyroid ultrasound from 05/10/2021 consistent with atrophic moderately heterogeneous appearing thyroid without discrete nodules, or masses,  findings suggestive of chronic thyroiditis -Continue levothyroxine   3)Tobacco Abuse and  bronchitis----empirically Rx with bronchodilators, mucolytics, antitussives and Rocephin (last dose 07/28/21) -Smoking cessation advised -Nicotine patch advised   4)GERD/Hiatal Hernia--- CT chest, abdomen and pelvis showed moderate sized hiatal hernia -Continue Protonix   5)Dyspnea and hypoxia--- multifactorial in setting of bilateral PE, bronchitis and underlying right-sided pulmonary scarring from prior right breast cancer radiation -Treatment as above #3, -Supplemental oxygen   6)Elevated Troponin--- suspect due to demand ischemia in the setting of bilateral PE with hypoxia -Echo with preserved EF of 55% without regional wall motion abnormalities -Troponin 236>> 420>>311>>300 , EKG with Sinus tachycardia LAE, consider biatrial enlargement RSR' in V1 or V2, right VCD or RVH   7)Leukocytosis----WBC 13.1>> 11.0, most likely reactive due to #1 above, lactic acid is not elevated -Rocephin for bronchitis as above last dose 07/28/2021   8)Rt sided Breast Ca in 2018 --s/p prior Mastectomy, prior radiation --- c/n Tamoxifen   9) acute hypoxic respiratory failure--- due to bilateral pulmonary embolism -Echo noted -O2 sat 85 to 86% on room air at rest, 94 to 95% on 3 L -PTA patient was  not on home O2  Disposition/Need for in-Hospital Stay- patient unable to be discharged at this time due to ---  very symptomatic bilateral PE and extensive right leg DVT requiring IV heparin for at least 3 to 5 days prior to transition to oral anticoagulant -Possible transition to oral anticoagulant on or after 07/28/2021 if clinically improving   Status is: Inpatient   Remains inpatient appropriate because: Please see disposition above   Dispo: The patient is from: Home              Anticipated d/c is to: Home              Anticipated d/c date is: 3 days              Patient currently is not medically stable to d/c. Barriers: Not Clinically Stable-     Code Status :  -  Code Status: Full Code   Family Communication:    (patient is alert, awake and coherent) -discussed with patient's 2 sisters and patient's son at bedside  Consults  :  Discussed with PCCM attending Dr Halford Chessman and IR team  DVT Prophylaxis  :   --IV heparin   Lab Results  Component Value Date   PLT 192 07/26/2021    Inpatient Medications  Scheduled Meds:  dextromethorphan-guaiFENesin  1 tablet Oral BID   levothyroxine  150 mcg Oral QAC breakfast   nicotine  14 mg Transdermal Daily   pantoprazole  40 mg Oral Daily   sodium chloride flush  3 mL Intravenous Q12H   sodium chloride flush  3 mL Intravenous Q12H   tamoxifen  10 mg Oral Daily   tamsulosin  0.4 mg Oral BID   Continuous Infusions:  sodium chloride     sodium chloride 50 mL/hr at 07/25/21 1843   cefTRIAXone (ROCEPHIN)  IV 1 g (07/26/21 0100)   heparin 1,500 Units/hr (07/26/21 0839)   PRN Meds:.sodium chloride, acetaminophen **OR** acetaminophen, albuterol, bisacodyl, ondansetron **OR** ondansetron (ZOFRAN) IV, polyethylene glycol, sodium chloride flush, traMADol, traZODone   Anti-infectives (From admission, onward)    Start     Dose/Rate Route Frequency Ordered Stop   07/26/21 0000  cefTRIAXone (ROCEPHIN) 1 g in sodium chloride 0.9 % 100 mL IVPB        1 g 200 mL/hr over 30 Minutes Intravenous Every 24 hours 07/25/21 1745     07/25/21 0815  cefTRIAXone  (ROCEPHIN) 2 g in sodium chloride 0.9 % 100 mL IVPB        2 g 200 mL/hr over 30 Minutes Intravenous  Once 07/25/21 0812 07/25/21 0923   07/25/21 0815  doxycycline (VIBRA-TABS) tablet 100 mg        100 mg Oral  Once 07/25/21 1856 07/25/21 0830         Subjective: Nolon Nations today has no fevers, no emesis,  No chest pain,   -O2 sat 85 to 86% on room air at rest, 94 to 95% on 3 L -PTA patient was not on home O2 -Dyspnea at rest improving is noted exertion persist pleuritic chest pain persist -Right leg swelling and discomfort persist -Patient's son at bedside, questions answered  Objective: Vitals:   07/25/21 2107 07/26/21 0114 07/26/21 0444 07/26/21 0500  BP: 113/71 112/78 110/78   Pulse: 91 85 94   Resp: 20 18 18    Temp: 97.7 F (36.5 C) 97.8 F (36.6 C) 98.6 F (37 C)   TempSrc: Oral Oral    SpO2: 91% 91% (!) 81%  90%  Weight:      Height:        Intake/Output Summary (Last 24 hours) at 07/26/2021 1303 Last data filed at 07/26/2021 1238 Gross per 24 hour  Intake 794.66 ml  Output 1200 ml  Net -405.34 ml   Filed Weights   07/25/21 4098 07/25/21 1248  Weight: 66.7 kg 74 kg     Physical Exam  Gen:- Awake Alert, able to speak in sentences,+ve dyspnea on mild exertion noted HEENT:- [place].AT, No sclera icterus Nose- [place] 3L/min Neck-Supple Neck,No JVD,.  Lungs-diminished breath sounds with faint rales right lung and left  CV- S1, S2 normal, regular  Abd-  +ve B.Sounds, Abd Soft, No tenderness,    Extremity/Skin:-right lower extremity tenderness, swelling and warmth with prominent venous distention over the medial aspect of the right thigh,  intact peripheral pulses  Psych-affect is appropriate, oriented x3 Neuro-no new focal deficits, no tremors  Data Reviewed: I have personally reviewed following labs and imaging studies  CBC: Recent Labs  Lab 07/25/21 0717 07/26/21 0632  WBC 13.1* 11.0*  NEUTROABS 11.8*  --   HGB 14.5 12.4  HCT 44.3 38.6  MCV 97.8 98.5   PLT 205 119   Basic Metabolic Panel: Recent Labs  Lab 07/25/21 0717 07/26/21 0632  NA 136 138  K 3.8 4.2  CL 103 106  CO2 22 23  GLUCOSE 141* 102*  BUN 15 14  CREATININE 0.91 0.64  CALCIUM 8.6* 8.5*   GFR: Estimated Creatinine Clearance: 71.7 mL/min (by C-G formula based on SCr of 0.64 mg/dL). Liver Function Tests: Recent Labs  Lab 07/25/21 0717  AST 41  ALT 51*  ALKPHOS 73  BILITOT 0.9  PROT 7.3  ALBUMIN 3.4*   Recent Labs  Lab 07/25/21 0717  LIPASE 22   No results for input(s): AMMONIA in the last 168 hours. Coagulation Profile: No results for input(s): INR, PROTIME in the last 168 hours. Cardiac Enzymes: No results for input(s): CKTOTAL, CKMB, CKMBINDEX, TROPONINI in the last 168 hours. BNP (last 3 results) No results for input(s): PROBNP in the last 8760 hours. HbA1C: No results for input(s): HGBA1C in the last 72 hours. CBG: No results for input(s): GLUCAP in the last 168 hours. Lipid Profile: No results for input(s): CHOL, HDL, LDLCALC, TRIG, CHOLHDL, LDLDIRECT in the last 72 hours. Thyroid Function Tests: No results for input(s): TSH, T4TOTAL, FREET4, T3FREE, THYROIDAB in the last 72 hours. Anemia Panel: No results for input(s): VITAMINB12, FOLATE, FERRITIN, TIBC, IRON, RETICCTPCT in the last 72 hours. Urine analysis:    Component Value Date/Time   COLORURINE YELLOW 01/25/2014 2128   APPEARANCEUR Clear 09/04/2016 1019   LABSPEC 1.005 03/12/2017 1538   PHURINE 6.0 03/12/2017 1538   PHURINE 7.5 01/25/2014 2128   GLUCOSEU Negative 03/12/2017 1538   HGBUR Negative 03/12/2017 1538   HGBUR NEGATIVE 01/25/2014 2128   BILIRUBINUR Negative 03/12/2017 1538   KETONESUR Negative 03/12/2017 1538   KETONESUR NEGATIVE 01/25/2014 2128   PROTEINUR Negative 03/12/2017 1538   PROTEINUR NEGATIVE 01/25/2014 2128   UROBILINOGEN 0.2 03/12/2017 1538   NITRITE Negative 03/12/2017 1538   NITRITE NEGATIVE 01/25/2014 2128   LEUKOCYTESUR Small 03/12/2017 1538    Sepsis Labs: @LABRCNTIP (procalcitonin:4,lacticidven:4)  ) Recent Results (from the past 240 hour(s))  Resp Panel by RT-PCR (Flu A&B, Covid) Nasopharyngeal Swab     Status: None   Collection Time: 07/25/21  7:33 AM   Specimen: Nasopharyngeal Swab; Nasopharyngeal(NP) swabs in vial transport medium  Result Value Ref Range Status  SARS Coronavirus 2 by RT PCR NEGATIVE NEGATIVE Final    Comment: (NOTE) SARS-CoV-2 target nucleic acids are NOT DETECTED.  The SARS-CoV-2 RNA is generally detectable in upper respiratory specimens during the acute phase of infection. The lowest concentration of SARS-CoV-2 viral copies this assay can detect is 138 copies/mL. A negative result does not preclude SARS-Cov-2 infection and should not be used as the sole basis for treatment or other patient management decisions. A negative result may occur with  improper specimen collection/handling, submission of specimen other than nasopharyngeal swab, presence of viral mutation(s) within the areas targeted by this assay, and inadequate number of viral copies(<138 copies/mL). A negative result must be combined with clinical observations, patient history, and epidemiological information. The expected result is Negative.  Fact Sheet for Patients:  EntrepreneurPulse.com.au  Fact Sheet for Healthcare Providers:  IncredibleEmployment.be  This test is no t yet approved or cleared by the [place] FDA and  has been authorized for detection and/or diagnosis of SARS-CoV-2 by FDA under an Emergency Use Authorization (EUA). This EUA will remain  in effect (meaning this test can be used) for the duration of the COVID-19 declaration under Section 564(b)(1) of the Act, 21 U.S.C.section 360bbb-3(b)(1), unless the authorization is terminated  or revoked sooner.       Influenza A by PCR NEGATIVE NEGATIVE Final   Influenza B by PCR NEGATIVE NEGATIVE Final    Comment: (NOTE) The  Xpert Xpress SARS-CoV-2/FLU/RSV plus assay is intended as an aid in the diagnosis of influenza from Nasopharyngeal swab specimens and should not be used as a sole basis for treatment. Nasal washings and aspirates are unacceptable for Xpert Xpress SARS-CoV-2/FLU/RSV testing.  Fact Sheet for Patients: EntrepreneurPulse.com.au  Fact Sheet for Healthcare Providers: IncredibleEmployment.be  This test is not yet approved or cleared by the [place] FDA and has been authorized for detection and/or diagnosis of SARS-CoV-2 by FDA under an Emergency Use Authorization (EUA). This EUA will remain in effect (meaning this test can be used) for the duration of the COVID-19 declaration under Section 564(b)(1) of the Act, 21 U.S.C. section 360bbb-3(b)(1), unless the authorization is terminated or revoked.  Performed at [facility], [address] [place], [place] [address]       Radiology Studies: NM Pulmonary Perfusion  Result Date: 07/25/2021 CLINICAL DATA:  Chest pain, recent shortness of breath. Right lower extremity DVT. EXAM: NUCLEAR MEDICINE PERFUSION LUNG SCAN TECHNIQUE: Perfusion images were obtained in multiple projections after intravenous injection of radiopharmaceutical. Ventilation scans intentionally deferred if perfusion scan and chest x-ray adequate for interpretation during COVID 19 epidemic. RADIOPHARMACEUTICALS:  4.11 mCi Tc-50m MAA IV COMPARISON:  CT from earlier the same day, which demonstrates small effusions with airspace disease posteriorly at both lung bases. FINDINGS: There are numerous marked perfusion defects in both upper lobes right worse than left, a large defect involving most of the left lower lung, and smaller defects in the left upper lung and laterally in the right mid lung. IMPRESSION: Multiple perfusion defects out of proportion to CT findings, suggesting bilateral pulmonary emboli. Electronically Signed   By: Lucrezia Europe M.D.    On: 07/25/2021 12:50   US Venous Img Lower Bilateral (DVT)  Result Date: 07/25/2021 CLINICAL DATA:  Pulmonary emboli (HCC) I26.99 (ICD-10-CM) EXAM: BILATERAL LOWER EXTREMITY VENOUS DOPPLER ULTRASOUND TECHNIQUE: Gray-scale sonography with graded compression, as well as color Doppler and duplex ultrasound were performed to evaluate the lower extremity deep venous systems from the level of the common femoral vein and  including the common femoral, femoral, profunda femoral, popliteal and calf veins including the posterior tibial, peroneal and gastrocnemius veins when visible. The superficial great saphenous vein was also interrogated. Spectral Doppler was utilized to evaluate flow at rest and with distal augmentation maneuvers in the common femoral, femoral and popliteal veins. COMPARISON:  None. FINDINGS: RIGHT LOWER EXTREMITY Common Femoral Vein: No evidence of thrombus. Normal compressibility, respiratory phasicity and response to augmentation. Saphenofemoral Junction: No evidence of thrombus. Normal compressibility and flow on color Doppler imaging. Profunda Femoral Vein: No evidence of thrombus. Normal compressibility and flow on color Doppler imaging. Femoral Vein: Visible thrombus within the femoral vein which is noncompressible. Popliteal Vein: Visible thrombus within the popliteal vein, which is noncompressible. Calf Veins: Visible thrombus within the posterior tibial vein and peroneal vein, which are noncompressible. Superficial Great Saphenous Vein: No evidence of thrombus. Normal compressibility. Venous Reflux:  None. Other Findings:  None. LEFT LOWER EXTREMITY Common Femoral Vein: No evidence of thrombus. Normal compressibility, respiratory phasicity and response to augmentation. Saphenofemoral Junction: No evidence of thrombus. Normal compressibility and flow on color Doppler imaging. Profunda Femoral Vein: No evidence of thrombus. Normal compressibility and flow on color Doppler imaging. Femoral  Vein: No evidence of thrombus. Normal compressibility, respiratory phasicity and response to augmentation. Popliteal Vein: No evidence of thrombus. Normal compressibility, respiratory phasicity and response to augmentation. Calf Veins: No evidence of thrombus. Normal compressibility and flow on color Doppler imaging. Superficial Great Saphenous Vein: No evidence of thrombus. Normal compressibility. IMPRESSION: Positive for deep venous thrombosis involving the right femoral, popliteal, posterior tibial, and peroneal veins. Findings discussed with Dr. Satira Anis via telephone at 11:50 a.m. Electronically Signed   By: Margaretha Sheffield M.D.   On: 07/25/2021 11:53   ECHOCARDIOGRAM COMPLETE  Result Date: 07/25/2021    ECHOCARDIOGRAM REPORT   Patient Name:   Cynthia Chavez Date of Exam: 07/25/2021 Medical Rec #:  [id number]   Height:       67.0 in Accession #:    [id number]  Weight:       163.1 lb Date of Birth:  [birth year]   BSA:          1.855 m Patient Age:    79 years    BP:           116/82 mmHg Patient Gender: F           HR:           98 bpm. Exam Location:  [facility] Procedure: 2D Echo, Cardiac Doppler and Color Doppler Indications:    Dyspnea  History:        Patient has prior history of Echocardiogram examinations, most                 recent 05/17/2017. Breast CA, Pulmonary Embolus.  Sonographer:    Wenda Low Referring Phys: Brock Hall  1. Left ventricular ejection fraction, by estimation, is 55%. The left ventricle has normal function. The left ventricle has no regional wall motion abnormalities. There is mild left ventricular hypertrophy. Left ventricular diastolic parameters were normal.  2. Right ventricular systolic function is severely reduced. The right ventricular size is severely enlarged.  3. Right atrial size was moderately dilated.  4. The pericardial effusion is posterior to the left ventricle.  5. The mitral valve is abnormal. Trivial mitral valve regurgitation. No  evidence of mitral stenosis. Moderate mitral annular calcification.  6. The aortic valve is tricuspid. There is moderate calcification of the aortic valve.  Aortic valve regurgitation is not visualized. Aortic valve sclerosis/calcification is present, without any evidence of aortic stenosis.  7. The inferior vena cava is normal in size with greater than 50% respiratory variability, suggesting right atrial pressure of 3 mmHg. FINDINGS  Left Ventricle: Left ventricular ejection fraction, by estimation, is 55%. The left ventricle has normal function. The left ventricle has no regional wall motion abnormalities. The left ventricular internal cavity size was normal in size. There is mild left ventricular hypertrophy. Left ventricular diastolic parameters were normal. Right Ventricle: The right ventricular size is severely enlarged. Right vetricular wall thickness was not assessed. Right ventricular systolic function is severely reduced. Left Atrium: Left atrial size was normal in size. Right Atrium: Right atrial size was moderately dilated. Pericardium: Trivial pericardial effusion is present. The pericardial effusion is posterior to the left ventricle. Mitral Valve: The mitral valve is abnormal. There is mild thickening of the mitral valve leaflet(s). There is mild calcification of the mitral valve leaflet(s). Moderate mitral annular calcification. Trivial mitral valve regurgitation. No evidence of mitral valve stenosis. MV peak gradient, 3.5 mmHg. The mean mitral valve gradient is 1.0 mmHg. Tricuspid Valve: The tricuspid valve is normal in structure. Tricuspid valve regurgitation is mild . No evidence of tricuspid stenosis. Aortic Valve: The aortic valve is tricuspid. There is moderate calcification of the aortic valve. Aortic valve regurgitation is not visualized. Aortic valve sclerosis/calcification is present, without any evidence of aortic stenosis. Aortic valve mean gradient measures 4.0 mmHg. Aortic valve peak  gradient measures 6.9 mmHg. Aortic valve area, by VTI measures 1.91 cm. Pulmonic Valve: The pulmonic valve was normal in structure. Pulmonic valve regurgitation is not visualized. No evidence of pulmonic stenosis. Aorta: The aortic root is normal in size and structure. Venous: The inferior vena cava is normal in size with greater than 50% respiratory variability, suggesting right atrial pressure of 3 mmHg. IAS/Shunts: No atrial level shunt detected by color flow Doppler.  LEFT VENTRICLE PLAX 2D LVIDd:         3.60 cm     Diastology LVIDs:         2.30 cm     LV e' medial:    6.31 cm/s LV PW:         1.20 cm     LV E/e' medial:  13.4 LV IVS:        1.30 cm     LV e' lateral:   9.46 cm/s LVOT diam:     1.90 cm     LV E/e' lateral: 8.9 LV SV:         43 LV SV Index:   23 LVOT Area:     2.84 cm  LV Volumes (MOD) LV vol d, MOD A2C: 50.1 ml LV vol d, MOD A4C: 49.5 ml LV vol s, MOD A2C: 26.6 ml LV vol s, MOD A4C: 22.7 ml LV SV MOD A2C:     23.5 ml LV SV MOD A4C:     49.5 ml LV SV MOD BP:      26.5 ml RIGHT VENTRICLE RV Basal diam:  4.90 cm RV Mid diam:    4.30 cm RV S prime:     8.81 cm/s TAPSE (M-mode): 1.8 cm LEFT ATRIUM             Index        RIGHT ATRIUM           Index LA diam:        3.70 cm 2.00  cm/m   RA Area:     20.50 cm LA Vol (A2C):   38.9 ml 20.98 ml/m  RA Volume:   59.70 ml  32.19 ml/m LA Vol (A4C):   36.5 ml 19.68 ml/m LA Biplane Vol: 39.7 ml 21.41 ml/m  AORTIC VALVE                    PULMONIC VALVE AV Area (Vmax):    1.65 cm     PV Vmax:       0.70 m/s AV Area (Vmean):   1.87 cm     PV Peak grad:  2.0 mmHg AV Area (VTI):     1.91 cm AV Vmax:           131.00 cm/s AV Vmean:          87.200 cm/s AV VTI:            0.223 m AV Peak Grad:      6.9 mmHg AV Mean Grad:      4.0 mmHg LVOT Vmax:         76.40 cm/s LVOT Vmean:        57.400 cm/s LVOT VTI:          0.150 m LVOT/AV VTI ratio: 0.67  AORTA Ao Root diam: 3.10 cm Ao Asc diam:  3.00 cm MITRAL VALVE MV Area (PHT): 4.29 cm     SHUNTS MV Area  VTI:   2.49 cm     Systemic VTI:  0.15 m MV Peak grad:  3.5 mmHg     Systemic Diam: 1.90 cm MV Mean grad:  1.0 mmHg MV Vmax:       0.94 m/s MV Vmean:      56.3 cm/s MV Decel Time: 177 msec MV E velocity: 84.40 cm/s MV A velocity: 102.00 cm/s MV E/A ratio:  0.83 Jenkins Rouge MD Electronically signed by Jenkins Rouge MD Signature Date/Time: 07/25/2021/5:07:24 PM    Final    CT CHEST ABDOMEN PELVIS WO CONTRAST  Result Date: 07/25/2021 CLINICAL DATA:  Left-sided abdominal pain radiating in that region. Remote history of breast cancer on the right. Poly trauma. Status post endoscopy 2 weeks ago. Worsening epigastric pain, shortness of breath and abdominal tenderness. Question pneumonia or GI perforation. EXAM: CT CHEST, ABDOMEN AND PELVIS WITHOUT CONTRAST TECHNIQUE: Multidetector CT imaging of the chest, abdomen and pelvis was performed following the standard protocol without IV contrast. COMPARISON:  Chest CT 08/29/2019.  Abdominal CT 08/17/2016 FINDINGS: CT CHEST FINDINGS Cardiovascular: Heart size is normal. There is aortic atherosclerotic calcification. No pericardial effusion. Mediastinum/Nodes: Large hiatal hernia. No sign of mediastinal or hilar mass or lymphadenopathy. Lungs/Pleura: Pleural effusion on the right layering dependently. Very tiny amount of pleural fluid on the left. Dependent atelectasis, more on the right than the left. Patchy basilar pneumonia right more than left not excluded. Chronic parenchymal scarring on the right related to previous radiation. Musculoskeletal: No evidence of spinal fracture. No evidence of rib fracture or focal rib lesion. CT ABDOMEN PELVIS FINDINGS Hepatobiliary: Previous cholecystectomy. Liver parenchyma is normal. Pancreas: Normal Spleen: Normal Adrenals/Urinary Tract: Adrenal glands are normal. Kidneys are normal. No cyst, mass, stone or hydronephrosis. Bladder is normal. Stomach/Bowel: Hiatal hernia as noted above. No other bowel finding. No sign of perforation or  inflammatory change. Vascular/Lymphatic: Aortic atherosclerosis. No aneurysm. IVC is normal. No adenopathy. Reproductive: Previous hysterectomy.  No pelvic mass. Other: No free fluid or air. Musculoskeletal: No significant bone finding. IMPRESSION: Pleural effusions layering  dependently, larger on the right than the left, with dependent pulmonary density, right more than left. Basilar pneumonia is possible, particularly on the right. Moderate size hiatal hernia. Pulmonary scarring on the right related to radiation therapy. Aortic Atherosclerosis (ICD10-I70.0). Previous cholecystectomy and hysterectomy. No sign of acute complication related to the previous endoscopy. Electronically Signed   By: Nelson Chimes M.D.   On: 07/25/2021 08:07     Scheduled Meds:  dextromethorphan-guaiFENesin  1 tablet Oral BID   levothyroxine  150 mcg Oral QAC breakfast   nicotine  14 mg Transdermal Daily   pantoprazole  40 mg Oral Daily   sodium chloride flush  3 mL Intravenous Q12H   sodium chloride flush  3 mL Intravenous Q12H   tamoxifen  10 mg Oral Daily   tamsulosin  0.4 mg Oral BID   Continuous Infusions:  sodium chloride     sodium chloride 50 mL/hr at 07/25/21 1843   cefTRIAXone (ROCEPHIN)  IV 1 g (07/26/21 0100)   heparin 1,500 Units/hr (07/26/21 0839)     LOS: 1 day    Roxan Hockey M.D on 07/26/2021 at 1:03 PM  Go to [email or web address] - for contact info  [facility] - Office  [phone]  If 7PM-7AM, please contact night-coverage [email or web address] Password [email or web address] 07/26/2021, 1:03 PM

## 2021-07-26 NOTE — Progress Notes (Signed)
Continues to have pain around left breast, rib cage radiating to back.  Given tylenol and heat packs

## 2021-07-26 NOTE — Progress Notes (Signed)
Patient has no been able to void since admission to floor, approximately since 1400.  Patient states that her PO intake has been decreased since admitted to the floor.  Bladder scan performed by NT and showed 300 of urine in bladder.  I&O performed per MD order, and 500cc of yellow urine returned.

## 2021-07-26 NOTE — Progress Notes (Signed)
Bladder scan earlier showed over 600 and in and out was done.  Order that if have to do in and out again that the time after that should be foley insertion., per Dr. Maurene Capes No discomfort in abd at this time.  91 on 3 liters

## 2021-07-26 NOTE — Progress Notes (Addendum)
ANTICOAGULATION CONSULT NOTE - Follow Up Consult  Pharmacy Consult for heparin Indication: chest pain/ACS DVT/PE  Allergies  Allergen Reactions   Beef-Derived Products Hives, Shortness Of Breath, Swelling and Rash   Ivp Dye [Iodinated Diagnostic Agents] Other (See Comments)    Chest pain   Other Shortness Of Breath, Swelling and Other (See Comments)    Versed or Fentanyl when used for endoscopy caused throat and tongue swelling   Augmentin [Amoxicillin-Pot Clavulanate]    Dilaudid [Hydromorphone Hcl]     PT had CP and felt shaky. Didn't like the feeling, but sx lessened w/in 10 min of IV administration   Codeine Other (See Comments)    spasms    Patient Measurements: Height: 5\' 7"  (170.2 cm) Weight: 74 kg (163 lb 2.3 oz) IBW/kg (Calculated) : 61.6 Heparin Dosing Weight: 67kg  Vital Signs: Temp: 98.6 F (37 C) (12/20 0444) Temp Source: Oral (12/20 0114) BP: 110/78 (12/20 0444) Pulse Rate: 94 (12/20 0444)  Labs: Recent Labs    07/25/21 0717 07/25/21 0910 07/25/21 1435 07/25/21 1612 07/25/21 1855 07/26/21 0632  HGB 14.5  --   --   --   --  12.4  HCT 44.3  --   --   --   --  38.6  PLT 205  --   --   --   --  192  HEPARINUNFRC  --   --   --   --  0.19* 0.19*  CREATININE 0.91  --   --   --   --  0.64  TROPONINIHS 236* 420* 311* 300*  --   --     Estimated Creatinine Clearance: 71.7 mL/min (by C-G formula based on SCr of 0.64 mg/dL).   Medications: see MAR  Assessment: 45 YOF presented to ED with SOB and CP 2 weeks post laryngoscopy, found to have elevated troponins (max 420), bilateral PE on VQ scan and R leg DVT on doppler. Pharmacy consulted to dose heparin for ACS and PE/DVT. No anticoagulation PTA.   Heparin level 0.19 subtherapeutic   Goal of Therapy:  Heparin level 0.3-0.7 units/ml Monitor platelets by anticoagulation protocol: Yes   Plan:  Give heparin bolus 2000 units x1 Increase heparin infusion to 1500 units/hr 6 hour heparin level Daily heparin  level, CBC F/U PO anticoagulation plans  Margot Ables, PharmD Clinical Pharmacist 07/26/2021 8:09 AM

## 2021-07-26 NOTE — Progress Notes (Signed)
°  Transition of Care [facility]) Screening Note   Patient Details  Name: Cynthia Chavez Date of Birth: April 01, 1956   Transition of Care [facility]) CM/SW Contact:    Boneta Lucks, RN Phone Number: 07/26/2021, 11:21 AM    Transition of Care Department [facility]) has reviewed patient and no TOC needs have been identified at this time. We will continue to monitor patient advancement through interdisciplinary progression rounds. If new patient transition needs arise, please place a TOC consult.

## 2021-07-26 NOTE — Progress Notes (Signed)
ANTICOAGULATION CONSULT NOTE - Follow Up Consult  Pharmacy Consult for heparin Indication: chest pain/ACS DVT/PE  Allergies  Allergen Reactions   Beef-Derived Products Hives, Shortness Of Breath, Swelling and Rash   Ivp Dye [Iodinated Diagnostic Agents] Other (See Comments)    Chest pain   Other Shortness Of Breath, Swelling and Other (See Comments)    Versed or Fentanyl when used for endoscopy caused throat and tongue swelling   Augmentin [Amoxicillin-Pot Clavulanate]    Dilaudid [Hydromorphone Hcl]     PT had CP and felt shaky. Didn't like the feeling, but sx lessened w/in 10 min of IV administration   Codeine Other (See Comments)    spasms    Patient Measurements: Height: 5\' 7"  (170.2 cm) Weight: 74 kg (163 lb 2.3 oz) IBW/kg (Calculated) : 61.6 Heparin Dosing Weight: 67kg  Vital Signs: Temp: 98.8 F (37.1 C) (12/20 1425) BP: 117/86 (12/20 1425) Pulse Rate: 93 (12/20 1425)  Labs: Recent Labs    07/25/21 0717 07/25/21 0910 07/25/21 1435 07/25/21 1612 07/25/21 1855 07/26/21 0632 07/26/21 1526  HGB 14.5  --   --   --   --  12.4  --   HCT 44.3  --   --   --   --  38.6  --   PLT 205  --   --   --   --  192  --   HEPARINUNFRC  --   --   --   --  0.19* 0.19* 0.26*  CREATININE 0.91  --   --   --   --  0.64  --   TROPONINIHS 236* 420* 311* 300*  --   --   --     Estimated Creatinine Clearance: 71.7 mL/min (by C-G formula based on SCr of 0.64 mg/dL).   Medications: see MAR  Assessment: 15 YOF presented to ED with SOB and CP 2 weeks post laryngoscopy, found to have elevated troponins (max 420), bilateral PE on VQ scan and R leg DVT on doppler. Pharmacy consulted to dose heparin for ACS and PE/DVT. No anticoagulation PTA.   Heparin level 0.26 subtherapeutic   Goal of Therapy:  Heparin level 0.3-0.7 units/ml Monitor platelets by anticoagulation protocol: Yes   Plan:  Give heparin bolus 2000 units x1 Increase heparin infusion to 1700 units/hr 6 hour heparin  level Daily heparin level, CBC F/U PO anticoagulation plans  Margot Ables, PharmD Clinical Pharmacist 07/26/2021 4:09 PM

## 2021-07-27 DIAGNOSIS — J9601 Acute respiratory failure with hypoxia: Secondary | ICD-10-CM

## 2021-07-27 DIAGNOSIS — E039 Hypothyroidism, unspecified: Secondary | ICD-10-CM

## 2021-07-27 DIAGNOSIS — R778 Other specified abnormalities of plasma proteins: Secondary | ICD-10-CM

## 2021-07-27 LAB — CBC
HCT: 35.7 % — ABNORMAL LOW (ref 36.0–46.0)
Hemoglobin: 11.1 g/dL — ABNORMAL LOW (ref 12.0–15.0)
MCH: 30.3 pg (ref 26.0–34.0)
MCHC: 31.1 g/dL (ref 30.0–36.0)
MCV: 97.5 fL (ref 80.0–100.0)
Platelets: 179 10*3/uL (ref 150–400)
RBC: 3.66 MIL/uL — ABNORMAL LOW (ref 3.87–5.11)
RDW: 12.4 % (ref 11.5–15.5)
WBC: 10.7 10*3/uL — ABNORMAL HIGH (ref 4.0–10.5)
nRBC: 0 % (ref 0.0–0.2)

## 2021-07-27 LAB — RENAL FUNCTION PANEL
Albumin: 2.8 g/dL — ABNORMAL LOW (ref 3.5–5.0)
Anion gap: 8 (ref 5–15)
BUN: 12 mg/dL (ref 8–23)
CO2: 25 mmol/L (ref 22–32)
Calcium: 8.3 mg/dL — ABNORMAL LOW (ref 8.9–10.3)
Chloride: 103 mmol/L (ref 98–111)
Creatinine, Ser: 0.62 mg/dL (ref 0.44–1.00)
GFR, Estimated: >60 mL/min (ref >60)
Glucose, Bld: 110 mg/dL — ABNORMAL HIGH (ref 70–99)
Phosphorus: 3 mg/dL (ref 2.5–4.6)
Potassium: 4.4 mmol/L (ref 3.5–5.1)
Sodium: 136 mmol/L (ref 135–145)

## 2021-07-27 LAB — HEPARIN LEVEL (UNFRACTIONATED)
Heparin Unfractionated: 0.29 IU/mL — ABNORMAL LOW (ref 0.30–0.70)
Heparin Unfractionated: 0.21 IU/mL — ABNORMAL LOW (ref 0.30–0.70)

## 2021-07-27 MED ORDER — POLYETHYLENE GLYCOL 3350 17 G PO PACK
17.0000 g | PACK | Freq: Every day | ORAL | Status: DC
  Administered 2021-07-27 – 2021-07-28 (×2): 17 g via ORAL
  Filled 2021-07-27 (×2): qty 1

## 2021-07-27 MED ORDER — RIVAROXABAN 20 MG PO TABS: 20.0000 mg | ORAL_TABLET | Freq: Every day | ORAL | Status: DC

## 2021-07-27 MED ORDER — RIVAROXABAN 15 MG PO TABS
15.0000 mg | ORAL_TABLET | Freq: Two times a day (BID) | ORAL | Status: DC
  Administered 2021-07-27 – 2021-07-28 (×2): 15 mg via ORAL
  Filled 2021-07-27 (×2): qty 1

## 2021-07-27 MED ORDER — DOCUSATE SODIUM 100 MG PO CAPS
100.0000 mg | ORAL_CAPSULE | Freq: Two times a day (BID) | ORAL | Status: DC
  Administered 2021-07-27 – 2021-07-28 (×2): 100 mg via ORAL
  Filled 2021-07-27 (×2): qty 1

## 2021-07-27 NOTE — Progress Notes (Signed)
ANTICOAGULATION CONSULT NOTE - Follow Up Consult  Pharmacy Consult for heparin Indication: chest pain/ACS DVT/PE  Allergies  Allergen Reactions   Beef-Derived Products Hives, Shortness Of Breath, Swelling and Rash   Ivp Dye [Iodinated Diagnostic Agents] Other (See Comments)    Chest pain   Other Shortness Of Breath, Swelling and Other (See Comments)    Versed or Fentanyl when used for endoscopy caused throat and tongue swelling   Augmentin [Amoxicillin-Pot Clavulanate]    Dilaudid [Hydromorphone Hcl]     PT had CP and felt shaky. Didn't like the feeling, but sx lessened w/in 10 min of IV administration   Codeine Other (See Comments)    spasms    Patient Measurements: Height: 5\' 7"  (170.2 cm) Weight: 74 kg (163 lb 2.3 oz) IBW/kg (Calculated) : 61.6 Heparin Dosing Weight: 67kg  Vital Signs: Temp: 98.9 F (37.2 C) (12/21 0511) Temp Source: Oral (12/21 0511) BP: 146/92 (12/21 0511) Pulse Rate: 87 (12/21 0511)  Labs: Recent Labs    07/25/21 0717 07/25/21 0910 07/25/21 1435 07/25/21 1612 07/25/21 1855 07/26/21 0632 07/26/21 1526 07/26/21 2225 07/27/21 0547  HGB 14.5  --   --   --   --  12.4  --   --  11.1*  HCT 44.3  --   --   --   --  38.6  --   --  35.7*  PLT 205  --   --   --   --  192  --   --  179  HEPARINUNFRC  --   --   --   --    < > 0.19* 0.26* 0.41 0.29*  CREATININE 0.91  --   --   --   --  0.64  --   --  0.62  TROPONINIHS 236* 420* 311* 300*  --   --   --   --   --    < > = values in this interval not displayed.    Estimated Creatinine Clearance: 71.7 mL/min (by C-G formula based on SCr of 0.62 mg/dL).   Medications: see MAR  Assessment: 79 YOF presented to ED with SOB and CP 2 weeks post laryngoscopy, found to have elevated troponins (max 420), bilateral PE on VQ scan and R leg DVT on doppler. Pharmacy consulted to dose heparin for ACS and PE/DVT. No anticoagulation PTA.   Heparin level 0.29 subtherapeutic   Goal of Therapy:  Heparin level  0.3-0.7 units/ml Monitor platelets by anticoagulation protocol: Yes   Plan:  Increase heparin infusion to 1850 units/hr 6 hour heparin level Daily heparin level, CBC F/U PO anticoagulation plans  Margot Ables, PharmD Clinical Pharmacist 07/27/2021 8:58 AM

## 2021-07-27 NOTE — Progress Notes (Signed)
PROGRESS NOTE     Cynthia Chavez, is a 65 y.o. female, DOB - 09-Dec-1955, EML:[id number]  Admit date - 07/25/2021   Admitting Physician Courage Denton Brick, MD  Outpatient Primary MD for the patient is Janora Norlander, DO  LOS - 2  Chief Complaint  Patient presents with   Shortness of Breath        Brief Narrative:   66 y.o. female smoker with PMHx relevant for Rt sided Breast Ca in 2018 (s/p prior Mastectomy, prior radiation and currently on Tamoxifen), hypothyroidism , GERD/Hiatal presented to ED with ongoing dyspnea on exertion, chest discomfort especially with inspiration consistent with pleuritic type chest pain over the last 6 to 9 days worse over the last 3 to 4 days--work-up is consistent with bilateral PE and right leg DVT--- clinically and by echo suggest right heart strain--- -Continue IV anticoagulation with transition to oral Xarelto on 07/28/21  Assessment & Plan:   Principal Problem:   Bilateral pulmonary embolism and Rt Leg DVT - Rt Femoral, popliteal, posterior tibial and Peroneal veins Active Problems:   Hypothyroidism   Rt Leg DVT - Rt Femoral, popliteal, posterior tibial and Peroneal veins   Acute respiratory failure with hypoxia (HCC)   1)Bilateral Pulmonary Embolism and Rt Leg DVT -  --Rt Femoral, popliteal, posterior tibial and Peroneal veins---- -CT chest abdomen and pelvis without contrast suggested possible pneumonia with effusions--- when put in clinical context this is probably more likely consistent with PE with pulmonary infarction, rather than pneumonia--no CTA chest due to contrast allergy -Echo with preserved EF of 55%, Right ventricular systolic function is severely reduced. The right ventricular size is severely enlarged.  And Right atrial size was moderately dilated. -Continue IV heparin--started on 07/25/2021; planning to transition to oral anticoagulation on 12/22 -Case was discussed with PCCM attending Dr. Halford Chessman and with IR team----should  patient decompensate will need to premedicate for contrast allergy to do a CTA chest to determine if she would be a candidate for IR intervention----as per IR team  interventions cannot be done based on VQ scan. -Patient responding appropriately to empirical management with anticoagulation supportive care.  Based on discussion with herself and sisters by Dr. Denton Brick decision has been made not to pursued premedication for contrast imaging studies at this point.   2)Hypothyroidism- -thyroid ultrasound from 05/10/2021 consistent with atrophic moderately heterogeneous appearing thyroid without discrete nodules, or masses,  findings suggestive of chronic thyroiditis -Continue levothyroxine management   3)Tobacco Abuse and  bronchitis----empirically Rx with bronchodilators, mucolytics, antitussives and Rocephin (last dose 07/28/21) -Cessation counseling provided. -Nicotine patch recommended.   4)GERD/Hiatal Hernia--- CT chest, abdomen and pelvis showed moderate sized hiatal hernia. -Continue Protonix   5)Dyspnea and hypoxia--- multifactorial in setting of bilateral PE, bronchitis and underlying right-sided pulmonary scarring from prior right breast cancer radiation -Treatment as above #3, -Continue supplemental oxygen -Check desaturation screening.   6)Elevated Troponin--- suspect due to demand ischemia in the setting of bilateral PE with hypoxia -Echo with preserved EF of 55% without regional wall motion abnormalities -Troponin 236>> 420>>311>>300 , EKG with Sinus tachycardia LAE, consider biatrial enlargement RSR' in V1 or V2, right VCD or RVH.   7)Leukocytosis----WBC 13.1>> 11.0, most likely reactive due to #1 above, lactic acid is not elevated. -Rocephin for bronchitis as above last dose 07/28/2021.   8)Rt sided Breast Ca in 2018 - -s/p prior Mastectomy and radiation  -Continue the use of Tamoxifen -Continue patient on [facility] oncology service.  9) acute hypoxic respiratory failure--- due  to  bilateral pulmonary embolism -Echo ultrasound Heart strain. -O2 sat 85 to 86% on room air at rest, 94 to 95% on 3 L -PTA patient was not on home O2. -Will evaluate desaturation screening.  Disposition/Need for in-Hospital Stay- patient unable to be discharged at this time due to --- very symptomatic bilateral PE and extensive right leg DVT requiring IV heparin. -Possible transition to oral anticoagulant on 12/21   Status is: Inpatient   Remains inpatient appropriate because: Please see disposition above   Dispo: The patient is from: Home              Anticipated d/c is to: Home              Anticipated d/c date is: 1-2 days              Patient currently is not medically stable to d/c.  Barriers: Not Clinically Stable; still transitioning patient off IV anticoagulation and requiring oxygen.    Code Status :  -  Code Status: Full Code   Family Communication:   no family at bedside.  Consults  : Case was discussed with PCCM attending Dr Halford Chessman and IR team by Dr. Denton Brick.  DVT Prophylaxis  :   --IV heparin since 12/19 to 12/21  Rivaroxaban (XARELTO) tablet 15 mg  rivaroxaban (XARELTO) tablet 20 mg    Lab Results  Component Value Date   PLT 179 07/27/2021    Inpatient Medications  Scheduled Meds:  dextromethorphan-guaiFENesin  1 tablet Oral BID   levothyroxine  150 mcg Oral QAC breakfast   nicotine  14 mg Transdermal Daily   pantoprazole  40 mg Oral Daily   Rivaroxaban  15 mg Oral BID WC   Followed by   Derrill Memo ON 08/17/2021] rivaroxaban  20 mg Oral Q supper   sodium chloride flush  3 mL Intravenous Q12H   sodium chloride flush  3 mL Intravenous Q12H   tamoxifen  10 mg Oral Daily   tamsulosin  0.4 mg Oral BID   Continuous Infusions:  sodium chloride     cefTRIAXone (ROCEPHIN)  IV 1 g (07/26/21 2332)   PRN Meds:.sodium chloride, acetaminophen **OR** acetaminophen, albuterol, bisacodyl, ondansetron **OR** ondansetron (ZOFRAN) IV, polyethylene glycol, sodium chloride  flush, traMADol, traZODone   Anti-infectives (From admission, onward)    Start     Dose/Rate Route Frequency Ordered Stop   07/26/21 0000  cefTRIAXone (ROCEPHIN) 1 g in sodium chloride 0.9 % 100 mL IVPB        1 g 200 mL/hr over 30 Minutes Intravenous Every 24 hours 07/25/21 1745 07/28/21 2359   07/25/21 0815  cefTRIAXone (ROCEPHIN) 2 g in sodium chloride 0.9 % 100 mL IVPB        2 g 200 mL/hr over 30 Minutes Intravenous  Once 07/25/21 0812 07/25/21 0923   07/25/21 0815  doxycycline (VIBRA-TABS) tablet 100 mg        100 mg Oral  Once 07/25/21 4650 07/25/21 0830         Subjective: Nolon Nations using 3 L nasal cannula supplementation; still experiencing intermittent pleuritic chest pain and feeling short winded with activity.  Denies nausea, vomiting and fever.  Patient expressed to be weak and tired.  Objective: Vitals:   07/26/21 1700 07/26/21 2053 07/27/21 0511 07/27/21 1329  BP:  118/72 (!) 146/92 135/88  Pulse:  86 87 93  Resp:  19 17 16   Temp:  98 F (36.7 C) 98.9 F (37.2 C) 98.6 F (37 C)  TempSrc:   Oral Oral  SpO2: 91% 96% 95% 98%  Weight:      Height:        Intake/Output Summary (Last 24 hours) at 07/27/2021 1717 Last data filed at 07/27/2021 1557 Gross per 24 hour  Intake 1743.32 ml  Output 1050 ml  Net 693.32 ml   Filed Weights   07/25/21 0632 07/25/21 1248  Weight: 66.7 kg 74 kg     Physical Exam General exam: Alert, awake, oriented x 3; reporting short winded sensation with activity.  Still intermittently having pleuritic chest pain (more on the left side than the right).  Afebrile.  No nausea vomiting Respiratory system: 3 L nasal cannula in place.  Respiratory effort normal.  No using accessory muscles.  Positive scattered rhonchi.  No wheeze Cardiovascular system:RRR. No murmurs, rubs, gallops.  No JVD. Gastrointestinal system: Abdomen is nondistended, soft and nontender. No organomegaly or masses felt. Normal bowel sounds heard. Central  nervous system: Alert and oriented. No focal neurological deficits. Extremities: No cyanosis or clubbing.  Right lower extremity swollen (in comparison to her left lower extremity) and tender to palpation. Skin: No rashes, lesions or ulcers Psychiatry: Judgement and insight appear normal. Mood & affect appropriate.   Data Reviewed: I have personally reviewed following labs and imaging studies  CBC: Recent Labs  Lab 07/25/21 0717 07/26/21 0632 07/27/21 0547  WBC 13.1* 11.0* 10.7*  NEUTROABS 11.8*  --   --   HGB 14.5 12.4 11.1*  HCT 44.3 38.6 35.7*  MCV 97.8 98.5 97.5  PLT 205 192 665   Basic Metabolic Panel: Recent Labs  Lab 07/25/21 0717 07/26/21 0632 07/27/21 0547  NA 136 138 136  K 3.8 4.2 4.4  CL 103 106 103  CO2 22 23 25   GLUCOSE 141* 102* 110*  BUN 15 14 12   CREATININE 0.91 0.64 0.62  CALCIUM 8.6* 8.5* 8.3*  PHOS  --   --  3.0   GFR: Estimated Creatinine Clearance: 71.7 mL/min (by C-G formula based on SCr of 0.62 mg/dL).  Liver Function Tests: Recent Labs  Lab 07/25/21 0717 07/27/21 0547  AST 41  --   ALT 51*  --   ALKPHOS 73  --   BILITOT 0.9  --   PROT 7.3  --   ALBUMIN 3.4* 2.8*   Recent Labs  Lab 07/25/21 0717  LIPASE 22   Urine analysis:    Component Value Date/Time   COLORURINE YELLOW 01/25/2014 2128   APPEARANCEUR Clear 09/04/2016 1019   LABSPEC 1.005 03/12/2017 1538   PHURINE 6.0 03/12/2017 1538   PHURINE 7.5 01/25/2014 2128   GLUCOSEU Negative 03/12/2017 1538   HGBUR Negative 03/12/2017 1538   HGBUR NEGATIVE 01/25/2014 2128   BILIRUBINUR Negative 03/12/2017 1538   KETONESUR Negative 03/12/2017 1538   KETONESUR NEGATIVE 01/25/2014 2128   PROTEINUR Negative 03/12/2017 1538   PROTEINUR NEGATIVE 01/25/2014 2128   UROBILINOGEN 0.2 03/12/2017 1538   NITRITE Negative 03/12/2017 1538   NITRITE NEGATIVE 01/25/2014 2128   LEUKOCYTESUR Small 03/12/2017 1538   Sepsis Labs:  Recent Results (from the past 240 hour(s))  Resp Panel by  RT-PCR (Flu A&B, Covid) Nasopharyngeal Swab     Status: None   Collection Time: 07/25/21  7:33 AM   Specimen: Nasopharyngeal Swab; Nasopharyngeal(NP) swabs in vial transport medium  Result Value Ref Range Status   SARS Coronavirus 2 by RT PCR NEGATIVE NEGATIVE Final    Comment: (NOTE) SARS-CoV-2 target nucleic acids are NOT DETECTED.  The SARS-CoV-2 RNA  is generally detectable in upper respiratory specimens during the acute phase of infection. The lowest concentration of SARS-CoV-2 viral copies this assay can detect is 138 copies/mL. A negative result does not preclude SARS-Cov-2 infection and should not be used as the sole basis for treatment or other patient management decisions. A negative result may occur with  improper specimen collection/handling, submission of specimen other than nasopharyngeal swab, presence of viral mutation(s) within the areas targeted by this assay, and inadequate number of viral copies(<138 copies/mL). A negative result must be combined with clinical observations, patient history, and epidemiological information. The expected result is Negative.  Fact Sheet for Patients:  EntrepreneurPulse.com.au  Fact Sheet for Healthcare Providers:  IncredibleEmployment.be  This test is no t yet approved or cleared by the [place] FDA and  has been authorized for detection and/or diagnosis of SARS-CoV-2 by FDA under an Emergency Use Authorization (EUA). This EUA will remain  in effect (meaning this test can be used) for the duration of the COVID-19 declaration under Section 564(b)(1) of the Act, 21 U.S.C.section 360bbb-3(b)(1), unless the authorization is terminated  or revoked sooner.       Influenza A by PCR NEGATIVE NEGATIVE Final   Influenza B by PCR NEGATIVE NEGATIVE Final    Comment: (NOTE) The Xpert Xpress SARS-CoV-2/FLU/RSV plus assay is intended as an aid in the diagnosis of influenza from Nasopharyngeal swab  specimens and should not be used as a sole basis for treatment. Nasal washings and aspirates are unacceptable for Xpert Xpress SARS-CoV-2/FLU/RSV testing.  Fact Sheet for Patients: EntrepreneurPulse.com.au  Fact Sheet for Healthcare Providers: IncredibleEmployment.be  This test is not yet approved or cleared by the [place] FDA and has been authorized for detection and/or diagnosis of SARS-CoV-2 by FDA under an Emergency Use Authorization (EUA). This EUA will remain in effect (meaning this test can be used) for the duration of the COVID-19 declaration under Section 564(b)(1) of the Act, 21 U.S.C. section 360bbb-3(b)(1), unless the authorization is terminated or revoked.  Performed at [facility], [address], [place], [place] [address]       Radiology Studies: No results found.   Scheduled Meds:  dextromethorphan-guaiFENesin  1 tablet Oral BID   levothyroxine  150 mcg Oral QAC breakfast   nicotine  14 mg Transdermal Daily   pantoprazole  40 mg Oral Daily   Rivaroxaban  15 mg Oral BID WC   Followed by   Derrill Memo ON 08/17/2021] rivaroxaban  20 mg Oral Q supper   sodium chloride flush  3 mL Intravenous Q12H   sodium chloride flush  3 mL Intravenous Q12H   tamoxifen  10 mg Oral Daily   tamsulosin  0.4 mg Oral BID   Continuous Infusions:  sodium chloride     cefTRIAXone (ROCEPHIN)  IV 1 g (07/26/21 2332)     LOS: 2 days    Barton Dubois M.D on 07/27/2021 at 5:17 PM  Go to [email or web address] - for contact info  [facility] - Office  [phone]  If 7PM-7AM, please contact night-coverage [email or web address] Password [facility] 07/27/2021, 5:17 PM

## 2021-07-27 NOTE — Progress Notes (Signed)
ANTICOAGULATION CONSULT NOTE - Follow Up Consult  Pharmacy Consult for heparin>>> Xarelto Indication: chest pain/ACS DVT/PE  Allergies  Allergen Reactions   Beef-Derived Products Hives, Shortness Of Breath, Swelling and Rash   Ivp Dye [Iodinated Diagnostic Agents] Other (See Comments)    Chest pain   Other Shortness Of Breath, Swelling and Other (See Comments)    Versed or Fentanyl when used for endoscopy caused throat and tongue swelling   Augmentin [Amoxicillin-Pot Clavulanate]    Dilaudid [Hydromorphone Hcl]     PT had CP and felt shaky. Didn't like the feeling, but sx lessened w/in 10 min of IV administration   Codeine Other (See Comments)    spasms    Patient Measurements: Height: 5\' 7"  (170.2 cm) Weight: 74 kg (163 lb 2.3 oz) IBW/kg (Calculated) : 61.6 Heparin Dosing Weight: 67kg  Vital Signs: Temp: 98.6 F (37 C) (12/21 1329) Temp Source: Oral (12/21 1329) BP: 135/88 (12/21 1329) Pulse Rate: 93 (12/21 1329)  Labs: Recent Labs    07/25/21 0717 07/25/21 0910 07/25/21 1435 07/25/21 1612 07/25/21 1855 07/26/21 0632 07/26/21 1526 07/26/21 2225 07/27/21 0547 07/27/21 1546  HGB 14.5  --   --   --   --  12.4  --   --  11.1*  --   HCT 44.3  --   --   --   --  38.6  --   --  35.7*  --   PLT 205  --   --   --   --  192  --   --  179  --   HEPARINUNFRC  --   --   --   --    < > 0.19*   < > 0.41 0.29* 0.21*  CREATININE 0.91  --   --   --   --  0.64  --   --  0.62  --   TROPONINIHS 236* 420* 311* 300*  --   --   --   --   --   --    < > = values in this interval not displayed.    Estimated Creatinine Clearance: 71.7 mL/min (by C-G formula based on SCr of 0.62 mg/dL).   Medications: see MAR  Assessment: 69 YOF presented to ED with SOB and CP 2 weeks post laryngoscopy, found to have elevated troponins (max 420), bilateral PE on VQ scan and R leg DVT on doppler. Pharmacy consulted to dose heparin for ACS and PE/DVT. No anticoagulation PTA.   Heparin level 0.21-  heparin continues to be subtherapeutic despite rate increases. MD agrees to change to Xarelto.  Goal of Therapy:  Heparin level 0.3-0.7 units/ml Monitor platelets by anticoagulation protocol: Yes   Plan:  Stop heparin infusion. Start Xarelto 15 mg twice daily x 21 days followed by Xarelto 20 mg daily. Monitor H&H and s/s of bleeding.  Margot Ables, PharmD Clinical Pharmacist 07/27/2021 5:13 PM

## 2021-07-28 DIAGNOSIS — R5381 Other malaise: Secondary | ICD-10-CM

## 2021-07-28 DIAGNOSIS — R778 Other specified abnormalities of plasma proteins: Secondary | ICD-10-CM

## 2021-07-28 DIAGNOSIS — R7989 Other specified abnormal findings of blood chemistry: Secondary | ICD-10-CM

## 2021-07-28 DIAGNOSIS — I82491 Acute embolism and thrombosis of other specified deep vein of right lower extremity: Secondary | ICD-10-CM

## 2021-07-28 LAB — CBC
HCT: 31.8 % — ABNORMAL LOW (ref 36.0–46.0)
Hemoglobin: 10.2 g/dL — ABNORMAL LOW (ref 12.0–15.0)
MCH: 30.6 pg (ref 26.0–34.0)
MCHC: 32.1 g/dL (ref 30.0–36.0)
MCV: 95.5 fL (ref 80.0–100.0)
Platelets: 197 10*3/uL (ref 150–400)
RBC: 3.33 MIL/uL — ABNORMAL LOW (ref 3.87–5.11)
RDW: 12.6 % (ref 11.5–15.5)
WBC: 9.4 10*3/uL (ref 4.0–10.5)
nRBC: 0 % (ref 0.0–0.2)

## 2021-07-28 LAB — BASIC METABOLIC PANEL
Anion gap: 7 (ref 5–15)
BUN: 13 mg/dL (ref 8–23)
CO2: 24 mmol/L (ref 22–32)
Calcium: 8.2 mg/dL — ABNORMAL LOW (ref 8.9–10.3)
Chloride: 104 mmol/L (ref 98–111)
Creatinine, Ser: 0.59 mg/dL (ref 0.44–1.00)
GFR, Estimated: >60 mL/min (ref >60)
Glucose, Bld: 98 mg/dL (ref 70–99)
Potassium: 3.7 mmol/L (ref 3.5–5.1)
Sodium: 135 mmol/L (ref 135–145)

## 2021-07-28 MED ORDER — RIVAROXABAN 20 MG PO TABS: 20.0000 mg | ORAL_TABLET | Freq: Every day | ORAL | 3 refills | Status: DC

## 2021-07-28 MED ORDER — TRAMADOL HCL 50 MG PO TABS: 50.0000 mg | ORAL_TABLET | Freq: Three times a day (TID) | ORAL | 0 refills | Status: DC | PRN

## 2021-07-28 MED ORDER — METHOCARBAMOL 500 MG PO TABS: 500.0000 mg | ORAL_TABLET | Freq: Four times a day (QID) | ORAL | 0 refills | Status: DC | PRN

## 2021-07-28 MED ORDER — ESOMEPRAZOLE MAGNESIUM 40 MG PO CPDR: 40.0000 mg | DELAYED_RELEASE_CAPSULE | Freq: Every day | ORAL | 2 refills | Status: DC

## 2021-07-28 MED ORDER — TAMSULOSIN HCL 0.4 MG PO CAPS: 0.4000 mg | ORAL_CAPSULE | Freq: Every day | ORAL | 1 refills | Status: DC

## 2021-07-28 MED ORDER — NICOTINE 14 MG/24HR TD PT24: 14.0000 mg | MEDICATED_PATCH | Freq: Every day | TRANSDERMAL | 0 refills | Status: DC

## 2021-07-28 MED ORDER — RIVAROXABAN 15 MG PO TABS: 15.0000 mg | ORAL_TABLET | Freq: Two times a day (BID) | ORAL | 0 refills | Status: DC

## 2021-07-28 MED ORDER — DOCUSATE SODIUM 100 MG PO CAPS: 100.0000 mg | ORAL_CAPSULE | Freq: Two times a day (BID) | ORAL | 0 refills | Status: DC

## 2021-07-28 MED ORDER — POLYETHYLENE GLYCOL 3350 17 G PO PACK: 17.0000 g | PACK | Freq: Every day | ORAL | 1 refills | Status: DC | PRN

## 2021-07-28 NOTE — Discharge Summary (Signed)
Physician Discharge Summary  Cynthia Chavez:[id number] DOB: May 27, 1956 DOA: 07/25/2021  PCP: Janora Norlander, DO  Admit date: 07/25/2021 Discharge date: 07/28/2021  Time spent: 35 minutes  Recommendations for Outpatient Follow-up:  Repeat CBC to follow hemoglobin and platelets, Repeat basic metabolic panel to follow to lites and renal function. Continue assisting patient with tobacco cessation Outpatient follow-up with oncology service as discussed. Continue to wean oxygen supplementation as tolerated.   Discharge Diagnoses:  Principal Problem:   Bilateral pulmonary embolism and Rt Leg DVT - Rt Femoral, popliteal, posterior tibial and Peroneal veins Active Problems:   Hypothyroidism   Rt Leg DVT - Rt Femoral, popliteal, posterior tibial and Peroneal veins   Acute respiratory failure with hypoxia (HCC)   Elevated troponin   Physical deconditioning   Discharge Condition: Stable and improved.  Discharged home with home health physical therapy and oxygen supplementation.  Patient instructed to follow-up with PCP in 10 days.  CODE STATUS: Full code.  Diet recommendation: Regular diet.  Filed Weights   07/25/21 9562 07/25/21 1248  Weight: 66.7 kg 74 kg    History of present illness:  As per H&P written by Dr. Denton Brick on 07/25/2021. Cynthia Chavez  is a 65 y.o. female smoker with PMHx relevant for Rt sided Breast Ca in 2018 (s/p prior Mastectomy, prior radiation and currently on Tamoxifen), hypothyroidism , GERD/Hiatal presented to ED with ongoing dyspnea on exertion, chest discomfort especially with inspiration consistent with pleuritic type chest pain over the last 6 to 9 days worse over the last 3 to 4 days -Patient apparently had laryngoscopy by ENT physician for hoarse voice couple weeks ago---she was given Pepcid she thinks she has a reaction to rate, she felt bad and she has never felt the same since -No dizziness no palpitations no productive cough no fevers no  chills -No sick contacts at home -Patient complains of worsening dyspnea on exertion worsening chest discomfort with inspiration  Denies specific focal area of extremity pain but she has generalized aches and pains in her legs CT chest abdomen and pelvis without contrast suggested possible pneumonia with effusions--- when put in clinical context this is probably more likely consistent with PE with pulmonary infarction -Lower extremity Dopplers consistent with extensive right leg DVT VQ scan consistent with bilateral PE WBC is 13.1, creatinine 0.91 ALT 51 Troponin 236>> 420>>311>>300 , EKG with Sinus tachycardia LAE, consider biatrial enlargement RSR' in V1 or V2, right VCD or RVH -IV heparin initiated in the ED  additional history obtained from patient's 2 sisters at bedside  In ED patient was tachycardic, tachypneic and transiently hypoxic.  Hospital Course:    1)Bilateral Pulmonary Embolism and Rt Leg DVT -  --Rt Femoral, popliteal, posterior tibial and Peroneal veins---- -CT chest abdomen and pelvis without contrast suggested possible pneumonia with effusions--- when put in clinical context this is probably more likely consistent with PE with pulmonary infarction, rather than pneumonia--no CTA chest due to contrast allergy -Echo with preserved EF of 55%, Right ventricular systolic function is severely reduced. The right ventricular size is severely enlarged; And Right atrial size was moderately dilated. -Treated with IV anticoagulation for 72 hours prior to transition to oral Xarelto. -Case was discussed with PCCM attending Dr. Halford Chessman and with IR team----should patient decompensate will need to premedicate for contrast allergy to do a CTA chest to determine if she would be a candidate for IR intervention----as per IR team  interventions cannot be done based on VQ scan. -Patient responding appropriately  to empirical management with anticoagulation supportive care.  Based on discussion with  herself and sisters by Dr. Denton Brick decision has been made not to pursued premedication for contrast imaging studies and to focus on anticoagulation therapy.   2)Hypothyroidism- -thyroid ultrasound from 05/10/2021 consistent with atrophic moderately heterogeneous appearing thyroid without discrete nodules, or masses,  findings suggestive of chronic thyroiditis -Continue levothyroxine management   3)Tobacco Abuse and  bronchitis----empirically Rx with bronchodilators, mucolytics, antitussives and Rocephin (last dose 07/28/21) -Cessation counseling provided. -Nicotine patch recommended/prescribed at discharge.   4)GERD/Hiatal Hernia--- CT chest, abdomen and pelvis showed moderate sized hiatal hernia. -Continue Protonix   5)Dyspnea and hypoxia--- multifactorial in setting of bilateral PE, bronchitis and underlying right-sided pulmonary scarring from prior right breast cancer radiation -Treated with anticoagulation and antibiotics. -Continue supplemental oxygen at discharge -Continue weaning off O2 as tolerated.   6)Elevated Troponin--- suspect due to demand ischemia in the setting of bilateral PE with hypoxia. -Echo with preserved EF of 55% without regional wall motion abnormalities -Troponin 236>> 420>>311>>300  -EKG with Sinus tachycardia; LAE, consider biatrial enlargement RSR' in V1 or V2, right VCD or RVH.   7)Leukocytosis----WBC 13.1>> 11.0, most likely reactive due to #1 above, lactic acid is not elevated. -Rocephin for bronchitis as above succesfully provided and well tolerated.    8)Rt sided Breast Ca in 2018 - -s/p prior Mastectomy and radiation  -Continue the use of Tamoxifen -Continue patient on M[facility] oncology service.   9) acute hypoxic respiratory failure--- due to bilateral pulmonary embolism -Echo ultrasound Heart strain. -O2 sat 85 to 87% on room air at rest, 94 to 95% on 3 L -PTA patient was not on home O2. -Will discharge home on oxygen  supplementation  Procedures: See below for x-ray reports.  Consultations: Case was discussed with PCCM attending Dr Halford Chessman and IR team by Dr. Denton Brick.  Discharge Exam: Vitals:   07/28/21 0347 07/28/21 1325  BP: 125/67 131/82  Pulse: 88 91  Resp: 19   Temp: 99.2 F (37.3 C) 98.1 F (36.7 C)  SpO2: 90% 97%    General: Afebrile, no nausea, no vomiting, reports mild pleuritic chest discomfort.  Good saturation with 3 L supplementation. Cardiovascular: S1 and S2, no rubs, no gallops, no JVD. Respiratory: Good air movement bilaterally; no wheezing, no crackles.  Positive scattered rhonchi.  No using accessory muscles. Abdomen: Soft, nontender, nondistended, positive bowel sounds Extremities: No cyanosis, no clubbing; right lower extremity less swollen on examination.  Discharge Instructions   Discharge Instructions     Discharge instructions   Complete by: As directed    Take medications as prescribed Maintain adequate hydration Arrange follow-up with PCP in 10 days Wean oxygen supplementation as tolerated Follow recommendations and instructions by home health services staff.      Allergies as of 07/28/2021       Reactions   Beef-derived Products Hives, Shortness Of Breath, Swelling, Rash   Ivp Dye [iodinated Diagnostic Agents] Other (See Comments)   Chest pain   Other Shortness Of Breath, Swelling, Other (See Comments)   Versed or Fentanyl when used for endoscopy caused throat and tongue swelling   Augmentin [amoxicillin-pot Clavulanate]    Dilaudid [hydromorphone Hcl]    PT had CP and felt shaky. Didn't like the feeling, but sx lessened w/in 10 min of IV administration   Codeine Other (See Comments)   spasms        Medication List     STOP taking these medications    betamethasone valerate ointment  0.1 % Commonly known as: VALISONE   Chlorphen-PE-Acetaminophen 4-10-325 MG Tabs   triamcinolone 55 MCG/ACT Aero nasal inhaler Commonly known as: NASACORT        TAKE these medications    acetaminophen 500 MG tablet Commonly known as: TYLENOL Take 1 tablet (500 mg total) by mouth every 6 (six) hours as needed.   docusate sodium 100 MG capsule Commonly known as: COLACE Take 1 capsule (100 mg total) by mouth 2 (two) times daily.   esomeprazole 40 MG capsule Commonly known as: NEXIUM Take 1 capsule (40 mg total) by mouth daily at 12 noon. What changed:  medication strength how much to take   levothyroxine 150 MCG tablet Commonly known as: SYNTHROID TAKE 1 TABLET (150 MCG TOTAL) BY MOUTH DAILY BEFORE BREAKFAST.   methocarbamol 500 MG tablet Commonly known as: Robaxin Take 1 tablet (500 mg total) by mouth every 6 (six) hours as needed for muscle spasms. What changed:  when to take this reasons to take this   nicotine 14 mg/24hr patch Commonly known as: NICODERM CQ - dosed in mg/24 hours Place 1 patch (14 mg total) onto the skin daily. Start taking on: July 29, 2021   polyethylene glycol 17 g packet Commonly known as: MIRALAX / GLYCOLAX Take 17 g by mouth daily as needed for mild constipation.   Rivaroxaban 15 MG Tabs tablet Commonly known as: XARELTO Take 1 tablet (15 mg total) by mouth 2 (two) times daily with a meal for 20 days.   rivaroxaban 20 MG Tabs tablet Commonly known as: XARELTO Take 1 tablet (20 mg total) by mouth daily with supper. Start taking on: August 18, 2021   tamoxifen 20 MG tablet Commonly known as: NOLVADEX TAKE 1/2 TABLET EVERY DAY   tamsulosin 0.4 MG Caps capsule Commonly known as: FLOMAX Take 1 capsule (0.4 mg total) by mouth daily.   traMADol 50 MG tablet Commonly known as: ULTRAM Take 1 tablet (50 mg total) by mouth every 8 (eight) hours as needed for severe pain.               Durable Medical Equipment  (From admission, onward)           Start     Ordered   07/28/21 1053  For home use only DME oxygen  Once       Question Answer Comment  Length of Need 12 Months    Mode or (Route) Nasal cannula   Liters per Minute 3   Frequency Continuous (stationary and portable oxygen unit needed)   Oxygen conserving device Yes   Oxygen delivery system Gas      07/28/21 1054           Allergies  Allergen Reactions   Beef-Derived Products Hives, Shortness Of Breath, Swelling and Rash   Ivp Dye [Iodinated Diagnostic Agents] Other (See Comments)    Chest pain   Other Shortness Of Breath, Swelling and Other (See Comments)    Versed or Fentanyl when used for endoscopy caused throat and tongue swelling   Augmentin [Amoxicillin-Pot Clavulanate]    Dilaudid [Hydromorphone Hcl]     PT had CP and felt shaky. Didn't like the feeling, but sx lessened w/in 10 min of IV administration   Codeine Other (See Comments)    spasms    Follow-up Information     [facility] Follow up.   Specialty: Home Health Services Why: PT will call to schedule your first home visit. Contact information: [address] [place] [address] [phone]         [facility] Follow up.   Why: Home oxygen will be delivered Contact information: [place] [address] [place], [place], [place]. Schedule an appointment as soon as possible for a visit in 10 day(s).   Specialty: Family Medicine Contact information: [place] [address] [phone]                 The results of significant diagnostics from this hospitalization (including imaging, microbiology, ancillary and laboratory) are listed below for reference.    Significant Diagnostic Studies: NM Pulmonary Perfusion  Result Date: 07/25/2021 CLINICAL DATA:  Chest pain, recent shortness of breath. Right lower extremity DVT. EXAM: NUCLEAR MEDICINE PERFUSION LUNG SCAN TECHNIQUE: Perfusion images were obtained in multiple projections after intravenous injection of radiopharmaceutical. Ventilation scans intentionally deferred if  perfusion scan and chest x-ray adequate for interpretation during COVID 19 epidemic. RADIOPHARMACEUTICALS:  4.11 mCi Tc-41m MAA IV COMPARISON:  CT from earlier the same day, which demonstrates small effusions with airspace disease posteriorly at both lung bases. FINDINGS: There are numerous marked perfusion defects in both upper lobes right worse than left, a large defect involving most of the left lower lung, and smaller defects in the left upper lung and laterally in the right mid lung. IMPRESSION: Multiple perfusion defects out of proportion to CT findings, suggesting bilateral pulmonary emboli. Electronically Signed   By: Lucrezia Europe M.D.   On: 07/25/2021 12:50   US Venous Img Lower Bilateral (DVT)  Result Date: 07/25/2021 CLINICAL DATA:  Pulmonary emboli (HCC) I26.99 (ICD-10-CM) EXAM: BILATERAL LOWER EXTREMITY VENOUS DOPPLER ULTRASOUND TECHNIQUE: Gray-scale sonography with graded compression, as well as color Doppler and duplex ultrasound were performed to evaluate the lower extremity deep venous systems from the level of the common femoral vein and including the common femoral, femoral, profunda femoral, popliteal and calf veins including the posterior tibial, peroneal and gastrocnemius veins when visible. The superficial great saphenous vein was also interrogated. Spectral Doppler was utilized to evaluate flow at rest and with distal augmentation maneuvers in the common femoral, femoral and popliteal veins. COMPARISON:  None. FINDINGS: RIGHT LOWER EXTREMITY Common Femoral Vein: No evidence of thrombus. Normal compressibility, respiratory phasicity and response to augmentation. Saphenofemoral Junction: No evidence of thrombus. Normal compressibility and flow on color Doppler imaging. Profunda Femoral Vein: No evidence of thrombus. Normal compressibility and flow on color Doppler imaging. Femoral Vein: Visible thrombus within the femoral vein which is noncompressible. Popliteal Vein: Visible thrombus within  the popliteal vein, which is noncompressible. Calf Veins: Visible thrombus within the posterior tibial vein and peroneal vein, which are noncompressible. Superficial Great Saphenous Vein: No evidence of thrombus. Normal compressibility. Venous Reflux:  None. Other Findings:  None. LEFT LOWER EXTREMITY Common Femoral Vein: No evidence of thrombus. Normal compressibility, respiratory phasicity and response to augmentation. Saphenofemoral Junction: No evidence of thrombus. Normal compressibility and flow on color Doppler imaging. Profunda Femoral Vein: No evidence of thrombus. Normal compressibility and flow on color Doppler imaging. Femoral Vein: No evidence of thrombus. Normal compressibility, respiratory phasicity and response to augmentation. Popliteal Vein: No evidence of thrombus. Normal compressibility, respiratory phasicity and response to augmentation. Calf Veins: No evidence of thrombus. Normal compressibility and flow on color Doppler imaging. Superficial Great Saphenous Vein: No evidence of thrombus. Normal compressibility. IMPRESSION: Positive for deep venous thrombosis involving the right femoral, popliteal, posterior  tibial, and peroneal veins. Findings discussed with Dr. Satira Anis via telephone at 11:50 a.m. Electronically Signed   By: Margaretha Sheffield M.D.   On: 07/25/2021 11:53   ECHOCARDIOGRAM COMPLETE  Result Date: 07/25/2021    ECHOCARDIOGRAM REPORT   Patient Name:   Cynthia Chavez Date of Exam: 07/25/2021 Medical Rec #:  [id number]   Height:       67.0 in Accession #:    [id number]  Weight:       163.1 lb Date of Birth:  1955-11-13   BSA:          1.855 m Patient Age:    24 years    BP:           116/82 mmHg Patient Gender: F           HR:           98 bpm. Exam Location:  [facility] Procedure: 2D Echo, Cardiac Doppler and Color Doppler Indications:    Dyspnea  History:        Patient has prior history of Echocardiogram examinations, most                 recent 05/17/2017. Breast CA, Pulmonary  Embolus.  Sonographer:    Wenda Low Referring Phys: [place]  1. Left ventricular ejection fraction, by estimation, is 55%. The left ventricle has normal function. The left ventricle has no regional wall motion abnormalities. There is mild left ventricular hypertrophy. Left ventricular diastolic parameters were normal.  2. Right ventricular systolic function is severely reduced. The right ventricular size is severely enlarged.  3. Right atrial size was moderately dilated.  4. The pericardial effusion is posterior to the left ventricle.  5. The mitral valve is abnormal. Trivial mitral valve regurgitation. No evidence of mitral stenosis. Moderate mitral annular calcification.  6. The aortic valve is tricuspid. There is moderate calcification of the aortic valve. Aortic valve regurgitation is not visualized. Aortic valve sclerosis/calcification is present, without any evidence of aortic stenosis.  7. The inferior vena cava is normal in size with greater than 50% respiratory variability, suggesting right atrial pressure of 3 mmHg. FINDINGS  Left Ventricle: Left ventricular ejection fraction, by estimation, is 55%. The left ventricle has normal function. The left ventricle has no regional wall motion abnormalities. The left ventricular internal cavity size was normal in size. There is mild left ventricular hypertrophy. Left ventricular diastolic parameters were normal. Right Ventricle: The right ventricular size is severely enlarged. Right vetricular wall thickness was not assessed. Right ventricular systolic function is severely reduced. Left Atrium: Left atrial size was normal in size. Right Atrium: Right atrial size was moderately dilated. Pericardium: Trivial pericardial effusion is present. The pericardial effusion is posterior to the left ventricle. Mitral Valve: The mitral valve is abnormal. There is mild thickening of the mitral valve leaflet(s). There is mild calcification of the  mitral valve leaflet(s). Moderate mitral annular calcification. Trivial mitral valve regurgitation. No evidence of mitral valve stenosis. MV peak gradient, 3.5 mmHg. The mean mitral valve gradient is 1.0 mmHg. Tricuspid Valve: The tricuspid valve is normal in structure. Tricuspid valve regurgitation is mild . No evidence of tricuspid stenosis. Aortic Valve: The aortic valve is tricuspid. There is moderate calcification of the aortic valve. Aortic valve regurgitation is not visualized. Aortic valve sclerosis/calcification is present, without any evidence of aortic stenosis. Aortic valve mean gradient measures 4.0 mmHg. Aortic valve peak gradient measures 6.9 mmHg. Aortic valve area, by VTI measures  1.91 cm. Pulmonic Valve: The pulmonic valve was normal in structure. Pulmonic valve regurgitation is not visualized. No evidence of pulmonic stenosis. Aorta: The aortic root is normal in size and structure. Venous: The inferior vena cava is normal in size with greater than 50% respiratory variability, suggesting right atrial pressure of 3 mmHg. IAS/Shunts: No atrial level shunt detected by color flow Doppler.  LEFT VENTRICLE PLAX 2D LVIDd:         3.60 cm     Diastology LVIDs:         2.30 cm     LV e' medial:    6.31 cm/s LV PW:         1.20 cm     LV E/e' medial:  13.4 LV IVS:        1.30 cm     LV e' lateral:   9.46 cm/s LVOT diam:     1.90 cm     LV E/e' lateral: 8.9 LV SV:         43 LV SV Index:   23 LVOT Area:     2.84 cm  LV Volumes (MOD) LV vol d, MOD A2C: 50.1 ml LV vol d, MOD A4C: 49.5 ml LV vol s, MOD A2C: 26.6 ml LV vol s, MOD A4C: 22.7 ml LV SV MOD A2C:     23.5 ml LV SV MOD A4C:     49.5 ml LV SV MOD BP:      26.5 ml RIGHT VENTRICLE RV Basal diam:  4.90 cm RV Mid diam:    4.30 cm RV S prime:     8.81 cm/s TAPSE (M-mode): 1.8 cm LEFT ATRIUM             Index        RIGHT ATRIUM           Index LA diam:        3.70 cm 2.00 cm/m   RA Area:     20.50 cm LA Vol (A2C):   38.9 ml 20.98 ml/m  RA Volume:   59.70  ml  32.19 ml/m LA Vol (A4C):   36.5 ml 19.68 ml/m LA Biplane Vol: 39.7 ml 21.41 ml/m  AORTIC VALVE                    PULMONIC VALVE AV Area (Vmax):    1.65 cm     PV Vmax:       0.70 m/s AV Area (Vmean):   1.87 cm     PV Peak grad:  2.0 mmHg AV Area (VTI):     1.91 cm AV Vmax:           131.00 cm/s AV Vmean:          87.200 cm/s AV VTI:            0.223 m AV Peak Grad:      6.9 mmHg AV Mean Grad:      4.0 mmHg LVOT Vmax:         76.40 cm/s LVOT Vmean:        57.400 cm/s LVOT VTI:          0.150 m LVOT/AV VTI ratio: 0.67  AORTA Ao Root diam: 3.10 cm Ao Asc diam:  3.00 cm MITRAL VALVE MV Area (PHT): 4.29 cm     SHUNTS MV Area VTI:   2.49 cm     Systemic VTI:  0.15 m MV Peak grad:  3.5 mmHg  Systemic Diam: 1.90 cm MV Mean grad:  1.0 mmHg MV Vmax:       0.94 m/s MV Vmean:      56.3 cm/s MV Decel Time: 177 msec MV E velocity: 84.40 cm/s MV A velocity: 102.00 cm/s MV E/A ratio:  0.83 Jenkins Rouge MD Electronically signed by Jenkins Rouge MD Signature Date/Time: 07/25/2021/5:07:24 PM    Final    CT CHEST ABDOMEN PELVIS WO CONTRAST  Result Date: 07/25/2021 CLINICAL DATA:  Left-sided abdominal pain radiating in that region. Remote history of breast cancer on the right. Poly trauma. Status post endoscopy 2 weeks ago. Worsening epigastric pain, shortness of breath and abdominal tenderness. Question pneumonia or GI perforation. EXAM: CT CHEST, ABDOMEN AND PELVIS WITHOUT CONTRAST TECHNIQUE: Multidetector CT imaging of the chest, abdomen and pelvis was performed following the standard protocol without IV contrast. COMPARISON:  Chest CT 08/29/2019.  Abdominal CT 08/17/2016 FINDINGS: CT CHEST FINDINGS Cardiovascular: Heart size is normal. There is aortic atherosclerotic calcification. No pericardial effusion. Mediastinum/Nodes: Large hiatal hernia. No sign of mediastinal or hilar mass or lymphadenopathy. Lungs/Pleura: Pleural effusion on the right layering dependently. Very tiny amount of pleural fluid on the left.  Dependent atelectasis, more on the right than the left. Patchy basilar pneumonia right more than left not excluded. Chronic parenchymal scarring on the right related to previous radiation. Musculoskeletal: No evidence of spinal fracture. No evidence of rib fracture or focal rib lesion. CT ABDOMEN PELVIS FINDINGS Hepatobiliary: Previous cholecystectomy. Liver parenchyma is normal. Pancreas: Normal Spleen: Normal Adrenals/Urinary Tract: Adrenal glands are normal. Kidneys are normal. No cyst, mass, stone or hydronephrosis. Bladder is normal. Stomach/Bowel: Hiatal hernia as noted above. No other bowel finding. No sign of perforation or inflammatory change. Vascular/Lymphatic: Aortic atherosclerosis. No aneurysm. IVC is normal. No adenopathy. Reproductive: Previous hysterectomy.  No pelvic mass. Other: No free fluid or air. Musculoskeletal: No significant bone finding. IMPRESSION: Pleural effusions layering dependently, larger on the right than the left, with dependent pulmonary density, right more than left. Basilar pneumonia is possible, particularly on the right. Moderate size hiatal hernia. Pulmonary scarring on the right related to radiation therapy. Aortic Atherosclerosis (ICD10-I70.0). Previous cholecystectomy and hysterectomy. No sign of acute complication related to the previous endoscopy. Electronically Signed   By: Nelson Chimes M.D.   On: 07/25/2021 08:07    Microbiology: Recent Results (from the past 240 hour(s))  Resp Panel by RT-PCR (Flu A&B, Covid) Nasopharyngeal Swab     Status: None   Collection Time: 07/25/21  7:33 AM   Specimen: Nasopharyngeal Swab; Nasopharyngeal(NP) swabs in vial transport medium  Result Value Ref Range Status   SARS Coronavirus 2 by RT PCR NEGATIVE NEGATIVE Final    Comment: (NOTE) SARS-CoV-2 target nucleic acids are NOT DETECTED.  The SARS-CoV-2 RNA is generally detectable in upper respiratory specimens during the acute phase of infection. The lowest concentration of  SARS-CoV-2 viral copies this assay can detect is 138 copies/mL. A negative result does not preclude SARS-Cov-2 infection and should not be used as the sole basis for treatment or other patient management decisions. A negative result may occur with  improper specimen collection/handling, submission of specimen other than nasopharyngeal swab, presence of viral mutation(s) within the areas targeted by this assay, and inadequate number of viral copies(<138 copies/mL). A negative result must be combined with clinical observations, patient history, and epidemiological information. The expected result is Negative.  Fact Sheet for Patients:  EntrepreneurPulse.com.au  Fact Sheet for Healthcare Providers:  IncredibleEmployment.be  This test is  no t yet approved or cleared by the [place] and  has been authorized for detection and/or diagnosis of SARS-CoV-2 by FDA under an Emergency Use Authorization (EUA). This EUA will remain  in effect (meaning this test can be used) for the duration of the COVID-19 declaration under Section 564(b)(1) of the Act, 21 U.S.C.section 360bbb-3(b)(1), unless the authorization is terminated  or revoked sooner.       Influenza A by PCR NEGATIVE NEGATIVE Final   Influenza B by PCR NEGATIVE NEGATIVE Final    Comment: (NOTE) The Xpert Xpress SARS-CoV-2/FLU/RSV plus assay is intended as an aid in the diagnosis of influenza from Nasopharyngeal swab specimens and should not be used as a sole basis for treatment. Nasal washings and aspirates are unacceptable for Xpert Xpress SARS-CoV-2/FLU/RSV testing.  Fact Sheet for Patients: EntrepreneurPulse.com.au  Fact Sheet for Healthcare Providers: IncredibleEmployment.be  This test is not yet approved or cleared by the [place] FDA and has been authorized for detection and/or diagnosis of SARS-CoV-2 by FDA under an Emergency Use  Authorization (EUA). This EUA will remain in effect (meaning this test can be used) for the duration of the COVID-19 declaration under Section 564(b)(1) of the Act, 21 U.S.C. section 360bbb-3(b)(1), unless the authorization is terminated or revoked.  Performed at [facility], [address], [place], [place] [address]      Labs: Basic Metabolic Panel: Recent Labs  Lab 07/25/21 0717 07/26/21 0632 07/27/21 0547 07/28/21 0546  NA 136 138 136 135  K 3.8 4.2 4.4 3.7  CL 103 106 103 104  CO2 22 23 25 24   GLUCOSE 141* 102* 110* 98  BUN 15 14 12 13   CREATININE 0.91 0.64 0.62 0.59  CALCIUM 8.6* 8.5* 8.3* 8.2*  PHOS  --   --  3.0  --    Liver Function Tests: Recent Labs  Lab 07/25/21 0717 07/27/21 0547  AST 41  --   ALT 51*  --   ALKPHOS 73  --   BILITOT 0.9  --   PROT 7.3  --   ALBUMIN 3.4* 2.8*   Recent Labs  Lab 07/25/21 0717  LIPASE 22   CBC: Recent Labs  Lab 07/25/21 0717 07/26/21 0632 07/27/21 0547 07/28/21 0546  WBC 13.1* 11.0* 10.7* 9.4  NEUTROABS 11.8*  --   --   --   HGB 14.5 12.4 11.1* 10.2*  HCT 44.3 38.6 35.7* 31.8*  MCV 97.8 98.5 97.5 95.5  PLT 205 192 179 197   Signed:  Barton Dubois MD.  [facility] 07/28/2021, 2:38 PM

## 2021-07-28 NOTE — Evaluation (Addendum)
Physical Therapy Evaluation Patient Details Name: Cynthia Chavez MRN: [id number] DOB: July 23, 1956 Today's Date: 07/28/2021  History of Present Illness  Cynthia Chavez  is a 65 y.o. female smoker with PMHx relevant for Rt sided Breast Ca in 2018 (s/p prior Mastectomy, prior radiation and currently on Tamoxifen), hypothyroidism , GERD/Hiatal presented to ED with ongoing dyspnea on exertion, chest discomfort especially with inspiration consistent with pleuritic type chest pain over the last 6 to 9 days worse over the last 3 to 4 days  -Patient apparently had laryngoscopy by ENT physician for hoarse voice couple weeks ago---she was given Pepcid she thinks she has a reaction to rate, she felt bad and she has never felt the same since  -No dizziness no palpitations no productive cough no fevers no chills  -No sick contacts at home  -Patient complains of worsening dyspnea on exertion worsening chest discomfort with inspiration   Clinical Impression  Patient functioning near baseline for functional mobility and gait demonstrating good return for ambulation in room and hallway without loss of balance, limited mostly due to fatigue and O2 desaturated from 92% to 87% when walking without c/o SOB - RN notified.  Patient left in bathroom on commode and encouraged to ambulate ad lib in room and with nursing staff in hallway.  Plan:  Patient discharged from physical therapy to care of nursing for ambulation daily as tolerated for length of stay.         Recommendations for follow up therapy are one component of a multi-disciplinary discharge planning process, led by the attending physician.  Recommendations may be updated based on patient status, additional functional criteria and insurance authorization.  Follow Up Recommendations Home health PT    Assistance Recommended at Discharge PRN  Functional Status Assessment Patient has had a recent decline in their functional status and demonstrates the ability to make  significant improvements in function in a reasonable and predictable amount of time.  Equipment Recommendations  None recommended by PT    Recommendations for Other Services       Precautions / Restrictions Precautions Precautions: None Restrictions Weight Bearing Restrictions: No      Mobility  Bed Mobility Overal bed mobility: Modified Independent                  Transfers Overall transfer level: Modified independent                      Ambulation/Gait Ambulation/Gait assistance: Modified independent (Device/Increase time) Gait Distance (Feet): 100 Feet Assistive device: None Gait Pattern/deviations: Decreased step length - left;Decreased stance time - right;Decreased stride length Gait velocity: decreased     General Gait Details: slightly labored cadence without loss of balance, on room air with SpO2 dropping from 92% to 86%  Stairs            Wheelchair Mobility    Modified Rankin (Stroke Patients Only)       Balance Overall balance assessment: No apparent balance deficits (not formally assessed)                                           Pertinent Vitals/Pain Pain Assessment: No/denies pain    Home Living Family/patient expects to be discharged to:: Private residence Living Arrangements: Spouse/significant other Available Help at Discharge: Family;Available 24 hours/day Type of Home: House Home Access: Stairs to enter Entrance  Stairs-Rails: None Entrance Stairs-Number of Steps: 1   Home Layout: One level Home Equipment: Conservation officer, nature (2 wheels);Cane - single point      Prior Function Prior Level of Function : Independent/Modified Independent             Mobility Comments: Hydrographic surveyor, drives ADLs Comments: Independent     Hand Dominance        Extremity/Trunk Assessment   Upper Extremity Assessment Upper Extremity Assessment: Overall WFL for tasks assessed    Lower Extremity  Assessment Lower Extremity Assessment: Generalized weakness    Cervical / Trunk Assessment Cervical / Trunk Assessment: Normal  Communication   Communication: No difficulties  Cognition Arousal/Alertness: Awake/alert Behavior During Therapy: WFL for tasks assessed/performed Overall Cognitive Status: Within Functional Limits for tasks assessed                                          General Comments      Exercises     Assessment/Plan    PT Assessment All further PT needs can be met in the next venue of care  PT Problem List Decreased strength;Decreased activity tolerance;Decreased balance;Decreased mobility       PT Treatment Interventions      PT Goals (Current goals can be found in the Care Plan section)  Acute Rehab PT Goals Patient Stated Goal: return home with family to assist PT Goal Formulation: With patient Time For Goal Achievement: 07/28/21 Potential to Achieve Goals: Good    Frequency     Barriers to discharge        Co-evaluation               AM-PAC PT "6 Clicks" Mobility  Outcome Measure Help needed turning from your back to your side while in a flat bed without using bedrails?: None Help needed moving from lying on your back to sitting on the side of a flat bed without using bedrails?: None Help needed moving to and from a bed to a chair (including a wheelchair)?: None Help needed standing up from a chair using your arms (e.g., wheelchair or bedside chair)?: None Help needed to walk in hospital room?: A Little Help needed climbing 3-5 steps with a railing? : A Little 6 Click Score: 22    End of Session Equipment Utilized During Treatment: Oxygen Activity Tolerance: Patient tolerated treatment well;Patient limited by fatigue Patient left: in chair;with call bell/phone within reach Nurse Communication: Mobility status PT Visit Diagnosis: Unsteadiness on feet (R26.81);Other abnormalities of gait and mobility (R26.89);Muscle  weakness (generalized) (M62.81)    Time: 0981-1914 PT Time Calculation (min) (ACUTE ONLY): 24 min   Charges:   PT Evaluation $PT Eval Moderate Complexity: 1 Mod PT Treatments $Therapeutic Activity: 23-37 mins        12:33 PM, 07/28/21 Lonell Grandchild, MPT Physical Therapist with [facility] 336 [phone] office [phone] mobile phone

## 2021-07-28 NOTE — TOC Transition Note (Signed)
Transition of Care [facility]) - CM/SW Discharge Note   Patient Details  Name: Cynthia Chavez MRN: [id number] Date of Birth: 1956/03/09  [facility] CM/SW Contact:  Boneta Lucks, RN Phone Number: 07/28/2021, 10:52 AM   Clinical Narrative:   Patient discharging home with need HHPT and home oxygen.  Georgina Snell with Lilian Coma with [employer] accepted the referral. Added to AVS, updated RN/MD.  Final next level of care: [place] Barriers to Discharge: Barriers Resolved   Patient Goals and CMS Choice Patient states their goals for this hospitalization and ongoing recovery are:: to go home. CMS Medicare.gov Compare Post Acute Care list provided to:: Patient Choice offered to / list presented to : Patient  Discharge Placement           Patient to be transferred to facility by: family   Patient and family notified of of transfer: 07/28/21  Discharge Plan and Services               DME Arranged: Oxygen DME Agency: [facility] Date DME Agency Contacted: 07/28/21 Time DME Agency Contacted: 0940 Representative spoke with at DME Agency: Caryl Pina HH Arranged: PT [place]: [place] Date [place]: 07/28/21 Time [place]: 43 Representative spoke with at [place]: Georgina Snell  Readmission Risk Interventions Readmission Risk Prevention Plan 07/28/2021  Medication Screening Complete  Transportation Screening Complete  Some recent data might be hidden

## 2021-07-28 NOTE — Care Management Important Message (Signed)
Important Message  Patient Details  Name: Cynthia Chavez MRN: [id number] Date of Birth: 03-10-1956   Medicare Important Message Given:  Yes     Tommy Medal 07/28/2021, 11:38 AM

## 2021-07-28 NOTE — Progress Notes (Signed)
SATURATION QUALIFICATIONS: (This note is used to comply with regulatory documentation for home oxygen)  Patient Saturations on Room Air at Rest = 90%  Patient Saturations on Room Air while Ambulating = 86%  Patient Saturations on 2 Liters of oxygen while Ambulating = 93%  Please briefly explain why patient needs home oxygen:  Pt's oxygenation dropped to 86% while ambulating on room air.

## 2021-07-29 ENCOUNTER — Telehealth: Payer: Self-pay | Admitting: Family Medicine

## 2021-07-29 NOTE — Telephone Encounter (Signed)
Appointment scheduled.

## 2021-08-03 ENCOUNTER — Telehealth: Payer: Self-pay | Admitting: *Deleted

## 2021-08-03 NOTE — Telephone Encounter (Signed)
Vm from [place] [facility] w/ [facility] FYI: doing eval for PT Needed to report pt has 3-4 medications that were written at discharge that she is not taking Colace, Flomax, Methocarbamol, Tramadol & Nicotine patch - she is not wanting to quit smoking.

## 2021-08-10 ENCOUNTER — Ambulatory Visit (INDEPENDENT_AMBULATORY_CARE_PROVIDER_SITE_OTHER): Payer: Medicare HMO | Admitting: Family Medicine

## 2021-08-10 ENCOUNTER — Encounter: Payer: Self-pay | Admitting: Family Medicine

## 2021-08-10 VITALS — BP 130/86 | HR 75 | Temp 97.9°F | Ht 67.0 in | Wt 168.8 lb

## 2021-08-10 DIAGNOSIS — I2699 Other pulmonary embolism without acute cor pulmonale: Secondary | ICD-10-CM

## 2021-08-10 DIAGNOSIS — C50411 Malignant neoplasm of upper-outer quadrant of right female breast: Secondary | ICD-10-CM

## 2021-08-10 DIAGNOSIS — Z09 Encounter for follow-up examination after completed treatment for conditions other than malignant neoplasm: Secondary | ICD-10-CM

## 2021-08-10 DIAGNOSIS — J9601 Acute respiratory failure with hypoxia: Secondary | ICD-10-CM | POA: Diagnosis not present

## 2021-08-10 DIAGNOSIS — Z170 Estrogen receptor positive status [ER+]: Secondary | ICD-10-CM | POA: Diagnosis not present

## 2021-08-10 LAB — CBC
Hematocrit: 36.2 % (ref 34.0–46.6)
Hemoglobin: 11.8 g/dL (ref 11.1–15.9)
MCH: 30.3 pg (ref 26.6–33.0)
MCHC: 32.6 g/dL (ref 31.5–35.7)
MCV: 93 fL (ref 79–97)
Platelets: 264 10*3/uL (ref 150–450)
RBC: 3.89 x10E6/uL (ref 3.77–5.28)
RDW: 12.4 % (ref 11.7–15.4)
WBC: 7.5 10*3/uL (ref 3.4–10.8)

## 2021-08-10 LAB — CMP14+EGFR
ALT: 16 IU/L (ref 0–32)
AST: 15 IU/L (ref 0–40)
Albumin/Globulin Ratio: 1.6 (ref 1.2–2.2)
Albumin: 3.9 g/dL (ref 3.8–4.8)
Alkaline Phosphatase: 79 IU/L (ref 44–121)
BUN/Creatinine Ratio: 17 (ref 12–28)
BUN: 13 mg/dL (ref 8–27)
Bilirubin Total: 0.3 mg/dL (ref 0.0–1.2)
CO2: 24 mmol/L (ref 20–29)
Calcium: 9 mg/dL (ref 8.7–10.3)
Chloride: 105 mmol/L (ref 96–106)
Creatinine, Ser: 0.78 mg/dL (ref 0.57–1.00)
Globulin, Total: 2.4 g/dL (ref 1.5–4.5)
Glucose: 103 mg/dL — ABNORMAL HIGH (ref 70–99)
Potassium: 4.3 mmol/L (ref 3.5–5.2)
Sodium: 141 mmol/L (ref 134–144)
Total Protein: 6.3 g/dL (ref 6.0–8.5)
eGFR: 83 mL/min/{1.73_m2} (ref >59)

## 2021-08-10 NOTE — Progress Notes (Signed)
Subjective: CC:Hospital discharge for PE/ DVT PCP: Cynthia Norlander, DO ZOX:[name] is a 66 y.o. female presenting to clinic today for:  1.  Pulmonary embolism with a right sided DVT Patient was seen in the hospital for shortness of breath.  She had noncontrast CT due to allergy and this was demonstrating concerning symptoms for possible infection but after further evaluation she was found to have an extensive right sided DVT affecting femoral, popliteal and peroneal veins.  Thus it was surmised that the CT was in fact reflecting an extensive pulmonary embolism.  Nevertheless she was empirically treated with antibiotics and anticoagulation.  She was ultimately transitioned over to Xarelto at discharge.  Unfortunately, at discharge she was also requiring ongoing oxygen therapy.  Smoking cessation was highly encouraged.  Home health was arranged.  Today she reports she has totally off oxygen and doing well.  She does not feel that she needs ongoing home health and is asking for that to be discontinued as well.  She has only taken 2 drawls of cigarette since discharge and she is really working towards 0 use of cigarettes totally.  She has an appointment with her oncologist on Monday and is nervous about this.   ROS: Per HPI  Allergies  Allergen Reactions   Beef-Derived Products Hives, Shortness Of Breath, Swelling and Rash   Ivp Dye [Iodinated Contrast Media] Other (See Comments)    Chest pain   Other Shortness Of Breath, Swelling and Other (See Comments)    Versed or Fentanyl when used for endoscopy caused throat and tongue swelling   Augmentin [Amoxicillin-Pot Clavulanate]    Dilaudid [Hydromorphone Hcl]     PT had CP and felt shaky. Didn't like the feeling, but sx lessened w/in 10 min of IV administration   Codeine Other (See Comments)    spasms   Past Medical History:  Diagnosis Date   Anxiety    Breast cancer ([place])    Cancer ([place])    breast cancer   Depression    Edema     bilateral feet and leg swelling   GERD (gastroesophageal reflux disease)    History of hiatal hernia    History of radiation therapy 02/12/17- 03/26/17   Chest wall and regional nodes, right- 50 Gy in 25 fractions, Chest Wall, boost, right- 10 Gy in 5 fractions.    Hypothyroidism    Malaise    Personal history of chemotherapy    Personal history of radiation therapy    Port catheter in place 09/08/2016   Sprain of shoulder, right 11/23/2020   Thyroid disease    Varicose veins     Current Outpatient Medications:    acetaminophen (TYLENOL) 500 MG tablet, Take 1 tablet (500 mg total) by mouth every 6 (six) hours as needed., Disp: 30 tablet, Rfl: 0   docusate sodium (COLACE) 100 MG capsule, Take 1 capsule (100 mg total) by mouth 2 (two) times daily., Disp: 10 capsule, Rfl: 0   esomeprazole (NEXIUM) 40 MG capsule, Take 1 capsule (40 mg total) by mouth daily at 12 noon., Disp: 30 capsule, Rfl: 2   levothyroxine (SYNTHROID) 150 MCG tablet, TAKE 1 TABLET (150 MCG TOTAL) BY MOUTH DAILY BEFORE BREAKFAST., Disp: 90 tablet, Rfl: 3   methocarbamol (ROBAXIN) 500 MG tablet, Take 1 tablet (500 mg total) by mouth every 6 (six) hours as needed for muscle spasms., Disp: 30 tablet, Rfl: 0   nicotine (NICODERM CQ - DOSED IN MG/24 HOURS) 14 mg/24hr patch, Place 1  patch (14 mg total) onto the skin daily., Disp: 28 patch, Rfl: 0   polyethylene glycol (MIRALAX / GLYCOLAX) 17 g packet, Take 17 g by mouth daily as needed for mild constipation., Disp: 28 each, Rfl: 1   Rivaroxaban (XARELTO) 15 MG TABS tablet, Take 1 tablet (15 mg total) by mouth 2 (two) times daily with a meal for 20 days., Disp: 40 tablet, Rfl: 0   [START ON 08/18/2021] rivaroxaban (XARELTO) 20 MG TABS tablet, Take 1 tablet (20 mg total) by mouth daily with supper., Disp: 30 tablet, Rfl: 3   tamoxifen (NOLVADEX) 20 MG tablet, TAKE 1/2 TABLET EVERY DAY, Disp: 45 tablet, Rfl: 1   tamsulosin (FLOMAX) 0.4 MG CAPS capsule, Take 1 capsule (0.4 mg total) by  mouth daily., Disp: 30 capsule, Rfl: 1   traMADol (ULTRAM) 50 MG tablet, Take 1 tablet (50 mg total) by mouth every 8 (eight) hours as needed for severe pain., Disp: 30 tablet, Rfl: 0 Social History   Socioeconomic History   Marital status: Married    Spouse name: Cynthia Chavez   Number of children: 3   Years of education: 11   Highest education level: 11th grade  Occupational History   Occupation: Scientist, water quality    Comment: [employer] part time  Tobacco Use   Smoking status: Every Day    Packs/day: 0.50    Years: 25.00    Pack years: 12.50    Types: Cigarettes    Start date: 04/19/1993   Smokeless tobacco: Never   Tobacco comments:    she stopped smoking for a long time, but started back.   Vaping Use   Vaping Use: Never used  Substance and Sexual Activity   Alcohol use: No   Drug use: No   Sexual activity: Not on file  Other Topics Concern   Not on file  Social History Narrative   One level living at home with her husband.   Children live nearby - visits with family once or twice per week.   Active still working part time, working outdoors, gardening, Social research officer, government.   Social Determinants of Radio broadcast assistant Strain: Low Risk    Difficulty of Paying Living Expenses: Not hard at all  Food Insecurity: No Food Insecurity   Worried About Charity fundraiser in the Last Year: Never true   Arboriculturist in the Last Year: Never true  Transportation Needs: No Transportation Needs   Lack of Transportation (Medical): No   Lack of Transportation (Non-Medical): No  Physical Activity: Sufficiently Active   Days of Exercise per Week: 5 days   Minutes of Exercise per Session: 30 min  Stress: No Stress Concern Present   Feeling of Stress : Not at all  Social Connections: Socially Integrated   Frequency of Communication with Friends and Family: More than three times a week   Frequency of Social Gatherings with Friends and Family: Twice a week   Attends Religious Services: More than 4  times per year   Active Member of [employer] or Organizations: Yes   Attends Music therapist: More than 4 times per year   Marital Status: Married  Human resources officer Violence: Not At Risk   Fear of Current or Ex-Partner: No   Emotionally Abused: No   Physically Abused: No   Sexually Abused: No   Family History  Problem Relation Age of Onset   COPD Father    Cancer Father     Objective: Office vital signs reviewed.  There were no vitals taken for this visit.  Physical Examination:  General: Awake, alert, nontoxic female, No acute distress Cardio: regular rate and rhythm, S1S2 heard, no murmurs appreciated Pulm: clear to auscultation bilaterally, no wheezes, rhonchi or rales; normal work of breathing on room air MSK: Ambulating independently.  Minimal edema in the right lower extremity  Assessment/ Plan: 66 y.o. female   Hospital discharge follow-up  Bilateral pulmonary embolism and Rt Leg DVT - Rt Femoral, popliteal, posterior tibial and Peroneal veins - Plan: CBC, CMP14+EGFR  Acute respiratory failure with hypoxia (HCC) - Plan: CBC, CMP14+EGFR  Malignant neoplasm of upper-outer quadrant of right breast in female, estrogen receptor positive ([place])  Seems to be doing extremely well status postdischarge from hospital.  Off of O2.  Okay to give verbal to discontinue both home health and home oxygen.  We will plan to see her back in about 3 months for interval checkup.  Repeat CBC given about two-point drop in hemoglobin prior to discharge.  She reported no bleeding however today.  CMP ordered.  Still not quite certain as to etiology of her clots.  She is currently treated for breast cancer and there is a questionable pneumonia that she was treated for.  We discussed that smoking certainly increases risk for clotting disorder.  Hopefully she will remain off of cigarettes long-term  No orders of the defined types were placed in this encounter.  No orders of the defined  types were placed in this encounter.   Cynthia Norlander, DO [name] [phone]

## 2021-08-11 ENCOUNTER — Other Ambulatory Visit: Payer: Self-pay

## 2021-08-11 ENCOUNTER — Other Ambulatory Visit: Payer: Self-pay | Admitting: *Deleted

## 2021-08-11 ENCOUNTER — Ambulatory Visit (INDEPENDENT_AMBULATORY_CARE_PROVIDER_SITE_OTHER): Payer: Medicare HMO

## 2021-08-11 DIAGNOSIS — C50911 Malignant neoplasm of unspecified site of right female breast: Secondary | ICD-10-CM | POA: Diagnosis not present

## 2021-08-11 DIAGNOSIS — I82441 Acute embolism and thrombosis of right tibial vein: Secondary | ICD-10-CM | POA: Diagnosis not present

## 2021-08-11 DIAGNOSIS — I82411 Acute embolism and thrombosis of right femoral vein: Secondary | ICD-10-CM | POA: Diagnosis not present

## 2021-08-11 DIAGNOSIS — J9601 Acute respiratory failure with hypoxia: Secondary | ICD-10-CM

## 2021-08-11 DIAGNOSIS — Z7901 Long term (current) use of anticoagulants: Secondary | ICD-10-CM

## 2021-08-11 DIAGNOSIS — I119 Hypertensive heart disease without heart failure: Secondary | ICD-10-CM

## 2021-08-11 DIAGNOSIS — J90 Pleural effusion, not elsewhere classified: Secondary | ICD-10-CM

## 2021-08-11 DIAGNOSIS — I2699 Other pulmonary embolism without acute cor pulmonale: Secondary | ICD-10-CM

## 2021-08-11 DIAGNOSIS — I700 Atherosclerosis of aorta: Secondary | ICD-10-CM

## 2021-08-11 DIAGNOSIS — I82402 Acute embolism and thrombosis of unspecified deep veins of left lower extremity: Secondary | ICD-10-CM | POA: Diagnosis not present

## 2021-08-11 DIAGNOSIS — I2510 Atherosclerotic heart disease of native coronary artery without angina pectoris: Secondary | ICD-10-CM

## 2021-08-11 DIAGNOSIS — I82431 Acute embolism and thrombosis of right popliteal vein: Secondary | ICD-10-CM | POA: Diagnosis not present

## 2021-08-11 DIAGNOSIS — K449 Diaphragmatic hernia without obstruction or gangrene: Secondary | ICD-10-CM

## 2021-08-11 DIAGNOSIS — D72829 Elevated white blood cell count, unspecified: Secondary | ICD-10-CM

## 2021-08-11 DIAGNOSIS — F17200 Nicotine dependence, unspecified, uncomplicated: Secondary | ICD-10-CM

## 2021-08-11 DIAGNOSIS — I82451 Acute embolism and thrombosis of right peroneal vein: Secondary | ICD-10-CM

## 2021-08-11 DIAGNOSIS — K219 Gastro-esophageal reflux disease without esophagitis: Secondary | ICD-10-CM

## 2021-08-11 DIAGNOSIS — E039 Hypothyroidism, unspecified: Secondary | ICD-10-CM

## 2021-08-11 DIAGNOSIS — Z9981 Dependence on supplemental oxygen: Secondary | ICD-10-CM

## 2021-08-11 DIAGNOSIS — I083 Combined rheumatic disorders of mitral, aortic and tricuspid valves: Secondary | ICD-10-CM

## 2021-08-11 DIAGNOSIS — J9811 Atelectasis: Secondary | ICD-10-CM

## 2021-08-11 DIAGNOSIS — I3139 Other pericardial effusion (noninflammatory): Secondary | ICD-10-CM

## 2021-08-11 MED ORDER — RIVAROXABAN 20 MG PO TABS: 20.0000 mg | ORAL_TABLET | Freq: Every day | ORAL | 1 refills | Status: DC

## 2021-08-11 MED ORDER — ESOMEPRAZOLE MAGNESIUM 40 MG PO CPDR: 40.0000 mg | DELAYED_RELEASE_CAPSULE | Freq: Every day | ORAL | 3 refills | Status: DC

## 2021-08-15 ENCOUNTER — Ambulatory Visit
Admission: RE | Admit: 2021-08-15 | Discharge: 2021-08-15 | Disposition: A | Discharge status: Home / Self Care | Payer: Medicare HMO | Source: Ambulatory Visit | Attending: General Surgery | Admitting: General Surgery

## 2021-08-15 ENCOUNTER — Encounter: Payer: Self-pay | Admitting: Hematology and Oncology

## 2021-08-15 DIAGNOSIS — Z1231 Encounter for screening mammogram for malignant neoplasm of breast: Secondary | ICD-10-CM

## 2021-08-18 ENCOUNTER — Telehealth: Payer: Self-pay | Admitting: Family Medicine

## 2021-08-18 NOTE — Telephone Encounter (Signed)
Pt called stating that she needs provider/nurse to fax documentation to [phone] stating that PCP took her off of her oxygen, so that the company can come pick up the oxygen.

## 2021-08-18 NOTE — Telephone Encounter (Signed)
I faxed the OV notes from her hospital follow up via Epic and pt is aware.

## 2021-10-05 NOTE — Assessment & Plan Note (Signed)
Rt Biopsy 11oclock and 2 o clock: Grade 3 IDC with LVI; LN biopsy: Positive IDC; 11oclock: Er 100%, PR 90%, Ki 67: 90%; Her 2 Neg Heterogeneous; LN: Er 100%, PR: 90%; Ki67: 60%; Her 2 Pos Ratio 2.37; 2oclock: Er 100%, PR 90%; Ki 67: 90%; Her 2 Positive 2.52 ?Breast MRI on 08/13/2016: 8 cm span of breast cancer with multiple enlarged axillary lymph nodes. ?CT CAP: 08/17/16: 4 mm right middle lobe pulmonary nodule unlikely to be metastatic disease ?Bone scan 08/17/2016: No evidence of metastatic disease ?? ?Echocardiogram performed on 05/17/2017 showed a left ventricular ejection fraction of 60-65%. ?? ?Treatment summary:? ?1. Neoadjuvant chemotherapy with Magnolia Perjeta ?6 cycles?given?08/16/16 to 12/01/16 followed by Herceptin Perjeta?maintenance for 1 year  ?2. Followed by mastectomy with axillary node dissection 01/03/2017 ?3. Adjuvant radiation therapy?completed 03/26/2017 ?4. Followed by Anti-estrogen therapy started 04/06/2017 ?5. Followed by Neratinib for 1 year?started 09/07/2017 ?-------------------------------------------------------------------------------------------------------------------------------------------------------------------------------------------------------------------------------------- ?01/04/2007 Right mastectomy: Multiple IDC with micropapillary features, LVI, grade 3, 1.5, 0.6, 0.5 cm, 12/20 lymph nodes positive for cancer, margins negative, ER 100%, PR 90%, HER-2 positive, Ki-67 60%,ypT1c ypN3 RCB class III; stage IIIB ?? ?Adjuvant radiation therapy started 02/12/2017?completed 03/26/2017 ?? ?Current treatment:?Neratinib (started 09/01/17?discontinued 10/04/2017) and?Anastrozole started 04/06/17?switched to tamoxifen 10/03/2018 ?? ?Tamoxifen toxicities:?Denies any hot flashes or arthralgias or myalgias. ?Right arm discomfort especially when she lifts heavy objects. ? ? ?Breast cancer surveillance: ?1.??Left breast mammogram: 08/15/21 Benign breast density category B ?2.??Breast exam: Benign  10/05/2020 ?3.??CT chest performed for lung nodule evaluation: 4 mm lung nodule ?? ?Return to clinic in 1 year for follow-up ?

## 2021-10-05 NOTE — Progress Notes (Signed)
Patient Care Team: Janora Norlander, DO as PCP - General (Family Medicine) Lavonna Monarch, MD as Consulting Physician (Dermatology)  DIAGNOSIS:    ICD-10-CM   1. Malignant neoplasm of upper-outer quadrant of right breast in female, estrogen receptor positive ([place])  C50.411    Z17.0       SUMMARY OF ONCOLOGIC HISTORY: Oncology History  Breast cancer of upper-outer quadrant of right female breast ([place])  07/24/2016 Mammogram   Rt Breast 2 masses at 11-12:30 Position spanning 8 cm; Additional mass at 2 o clock: 1.5 cm; LN with cort thickening 2.7 cm   07/27/2016 Initial Diagnosis   Rt Biopsy 11oclock and 2 o clock: Grade 3 IDC with LVI; LN biopsy: Positive IDC; 11oclock: Er 100%, PR 90%, Ki 67: 90%; Her 2 Neg Heterogeneous; LN: Er 100%, PR: 90%; Ki67: 60%; Her 2 Pos Ratio 2.37; 2oclock: Er 100%, PR 90%; Ki 67: 90%; Her 2 Positive 2.52   08/16/2016 - 12/01/2016 Neo-Adjuvant Chemotherapy   Neoadjuvant chemotherapy with TCH Perjeta (Taxotere d/ced after 2 cycles)   12/06/2016 Breast MRI   Residual right breast 2 o'clock enhancing mass which now measures 1.3 cm in greatest dimension.Residual non measurable scattered foci of heterogeneous enhancement throughout the superior and subareolar right breast, anterior and middle depth. Diminished right axillary lymphadenopathy   01/03/2017 Surgery   Right mastectomy: Multiple IDC with micropapillary features, LVI, grade 3, 1.5, 0.6, 0.5 cm, 12/20 lymph nodes positive for cancer, margins negative, ER 100%, PR 90%, HER-2 positive, Ki-67 60%,ypT1c ypN3 RCB class III; stage IIIB   02/12/2017 - 03/26/2017 Radiation Therapy   Adjuvant radiation therapy   04/06/2017 -  Anti-estrogen oral therapy   Anastrozole 1 mg by mouth daily, could not tolerate Neratinib; stopped 10/03/18 due to severe myalgias, switched to tamoxifen started 10/17/18     CHIEF COMPLIANT: Follow-up of right breast cancer on tamoxifen therapy  INTERVAL HISTORY: Cynthia Chavez is a 66  y.o. with above-mentioned history of antiestrogen therapy with anastrozole. Mammogram on 08/15/2021 showed no evidence of malignancy in the left breast. She presents to the clinic today for annual follow-up.  She was admitted to the hospital with DVT and PE and was treated with Xarelto.  She continues to have lower extremity swelling.  DVT and PE happened after hospitalization for pneumonia.   ALLERGIES:  is allergic to beef-derived products, ivp dye [iodinated contrast media], other, augmentin [amoxicillin-pot clavulanate], dilaudid [hydromorphone hcl], and codeine.  MEDICATIONS:  Current Outpatient Medications  Medication Sig Dispense Refill   acetaminophen (TYLENOL) 500 MG tablet Take 1 tablet (500 mg total) by mouth every 6 (six) hours as needed. 30 tablet 0   docusate sodium (COLACE) 100 MG capsule Take 1 capsule (100 mg total) by mouth 2 (two) times daily. 10 capsule 0   esomeprazole (NEXIUM) 40 MG capsule Take 1 capsule (40 mg total) by mouth daily at 12 noon. 90 capsule 3   levothyroxine (SYNTHROID) 150 MCG tablet TAKE 1 TABLET (150 MCG TOTAL) BY MOUTH DAILY BEFORE BREAKFAST. 90 tablet 3   methocarbamol (ROBAXIN) 500 MG tablet Take 1 tablet (500 mg total) by mouth every 6 (six) hours as needed for muscle spasms. (Patient not taking: Reported on 08/10/2021) 30 tablet 0   nicotine (NICODERM CQ - DOSED IN MG/24 HOURS) 14 mg/24hr patch Place 1 patch (14 mg total) onto the skin daily. (Patient not taking: Reported on 08/10/2021) 28 patch 0   rivaroxaban (XARELTO) 20 MG TABS tablet Take 1 tablet (20 mg total) by  mouth daily with supper. 90 tablet 1   tamoxifen (NOLVADEX) 20 MG tablet TAKE 1/2 TABLET EVERY DAY 45 tablet 1   tamsulosin (FLOMAX) 0.4 MG CAPS capsule Take 1 capsule (0.4 mg total) by mouth daily. (Patient not taking: Reported on 08/10/2021) 30 capsule 1   traMADol (ULTRAM) 50 MG tablet Take 1 tablet (50 mg total) by mouth every 8 (eight) hours as needed for severe pain. (Patient not taking:  Reported on 08/10/2021) 30 tablet 0   No current facility-administered medications for this visit.    PHYSICAL EXAMINATION: ECOG PERFORMANCE STATUS: 1 - Symptomatic but completely ambulatory  Vitals:   10/06/21 1059  BP: 128/85  Pulse: 79  Resp: 18  Temp: 97.6 F (36.4 C)  SpO2: 100%   Filed Weights   10/06/21 1059  Weight: 181 lb 6.4 oz (82.3 kg)    BREAST: No palpable masses or nodules in either right or left breasts. No palpable axillary supraclavicular or infraclavicular adenopathy no breast tenderness or nipple discharge. (exam performed in the presence of a chaperone)  LABORATORY DATA:  I have reviewed the data as listed CMP Latest Ref Rng & Units 08/10/2021 07/28/2021 07/27/2021  Glucose 70 - 99 mg/dL 103(H) 98 110(H)  BUN 8 - 27 mg/dL '13 13 12  ' Creatinine 0.57 - 1.00 mg/dL 0.78 0.59 0.62  Sodium 134 - 144 mmol/L 141 135 136  Potassium 3.5 - 5.2 mmol/L 4.3 3.7 4.4  Chloride 96 - 106 mmol/L 105 104 103  CO2 20 - 29 mmol/L '24 24 25  ' Calcium 8.7 - 10.3 mg/dL 9.0 8.2(L) 8.3(L)  Total Protein 6.0 - 8.5 g/dL 6.3 - -  Total Bilirubin 0.0 - 1.2 mg/dL 0.3 - -  Alkaline Phos 44 - 121 IU/L 79 - -  AST 0 - 40 IU/L 15 - -  ALT 0 - 32 IU/L 16 - -    Lab Results  Component Value Date   WBC 7.5 08/10/2021   HGB 11.8 08/10/2021   HCT 36.2 08/10/2021   MCV 93 08/10/2021   PLT 264 08/10/2021   NEUTROABS 11.8 (H) 07/25/2021    ASSESSMENT & PLAN:  Breast cancer of upper-outer quadrant of right female breast ([place]) Rt Biopsy 11oclock and 2 o clock: Grade 3 IDC with LVI; LN biopsy: Positive IDC; 11oclock: Er 100%, PR 90%, Ki 67: 90%; Her 2 Neg Heterogeneous; LN: Er 100%, PR: 90%; Ki67: 60%; Her 2 Pos Ratio 2.37; 2oclock: Er 100%, PR 90%; Ki 67: 90%; Her 2 Positive 2.52 Breast MRI on 08/13/2016: 8 cm span of breast cancer with multiple enlarged axillary lymph nodes. CT CAP: 08/17/16: 4 mm right middle lobe pulmonary nodule unlikely to be metastatic disease Bone scan 08/17/2016: No  evidence of metastatic disease   Echocardiogram performed on 05/17/2017 showed a left ventricular ejection fraction of 60-65%.   Treatment summary:  1. Neoadjuvant chemotherapy with TCH Perjeta 6 cycles given 08/16/16 to 12/01/16 followed by Herceptin Perjeta maintenance for 1 year  2. Followed by mastectomy with axillary node dissection 01/03/2017 3. Adjuvant radiation therapy completed 03/26/2017 4. Followed by Anti-estrogen therapy started 04/06/2017 5. Followed by Neratinib for 1 year started 09/07/2017 -------------------------------------------------------------------------------------------------------------------------------------------------------------------------------------------------------------------------------------- 01/04/2007 Right mastectomy: Multiple IDC with micropapillary features, LVI, grade 3, 1.5, 0.6, 0.5 cm, 12/20 lymph nodes positive for cancer, margins negative, ER 100%, PR 90%, HER-2 positive, Ki-67 60%,ypT1c ypN3 RCB class III; stage IIIB   Adjuvant radiation therapy started 02/12/2017 completed 03/26/2017   Current treatment: Neratinib (started 09/01/17 discontinued 10/04/2017) and Anastrozole started  04/06/17 switched to tamoxifen 10/03/2018   Tamoxifen toxicities: Continues to have joint pains and stiffness but much better at the 10 mg dose. History of DVT and PE December 2022: I recommended discontinuation of tamoxifen and switching her back to letrozole.  Duration of anticoagulation: 6 months    Breast cancer surveillance: 1.  Left breast mammogram: 08/15/21 Benign breast density category B 2.  Breast exam: Benign 10/05/2020 3.  CT chest performed for lung nodule evaluation: 4 mm lung nodule   Return to clinic in 1 year for follow-up    No orders of the defined types were placed in this encounter.  The patient has a good understanding of the overall plan. she agrees with it. she will call with any problems that may develop before the next visit  here.  Total time spent: 30 mins including face to face time and time spent for planning, charting and coordination of care  Rulon Eisenmenger, MD, MPH 10/06/2021  I, Thana Ates, am acting as scribe for Dr. Nicholas Lose.  I have reviewed the above documentation for accuracy and completeness, and I agree with the above.

## 2021-10-06 ENCOUNTER — Other Ambulatory Visit: Payer: Self-pay

## 2021-10-06 ENCOUNTER — Inpatient Hospital Stay: Payer: Medicare HMO | Attending: Hematology and Oncology | Admitting: Hematology and Oncology

## 2021-10-06 DIAGNOSIS — C50411 Malignant neoplasm of upper-outer quadrant of right female breast: Secondary | ICD-10-CM | POA: Diagnosis not present

## 2021-10-06 DIAGNOSIS — Z7901 Long term (current) use of anticoagulants: Secondary | ICD-10-CM | POA: Insufficient documentation

## 2021-10-06 DIAGNOSIS — R918 Other nonspecific abnormal finding of lung field: Secondary | ICD-10-CM | POA: Diagnosis not present

## 2021-10-06 DIAGNOSIS — Z79811 Long term (current) use of aromatase inhibitors: Secondary | ICD-10-CM | POA: Insufficient documentation

## 2021-10-06 DIAGNOSIS — Z170 Estrogen receptor positive status [ER+]: Secondary | ICD-10-CM | POA: Insufficient documentation

## 2021-10-06 DIAGNOSIS — Z923 Personal history of irradiation: Secondary | ICD-10-CM | POA: Diagnosis not present

## 2021-10-06 MED ORDER — LETROZOLE 2.5 MG PO TABS: 2.5000 mg | ORAL_TABLET | Freq: Every day | ORAL | 3 refills | Status: DC

## 2021-11-01 ENCOUNTER — Encounter: Payer: Self-pay | Admitting: Family Medicine

## 2021-11-02 ENCOUNTER — Telehealth: Payer: Self-pay | Admitting: Hematology and Oncology

## 2022-01-04 NOTE — Progress Notes (Incomplete)
HEMATOLOGY-ONCOLOGY TELEPHONE VISIT PROGRESS NOTE  I connected with '@PTNAME' @ on 01/04/22 at  8:00 AM EDT by telephone and verified that I am speaking with the correct person using two identifiers.  I discussed the limitations, risks, security and privacy concerns of performing an evaluation and management service by telephone and the availability of in person appointments.  I also discussed with the patient that there may be a patient responsible charge related to this service. The patient expressed understanding and agreed to proceed.   History of Present Illness: Cynthia Chavez is a 66 y.o. with above-mentioned history of antiestrogen therapy with anastrozole. She presents to the clinic today via telephone follow-up.  Oncology History  Breast cancer of upper-outer quadrant of right female breast ([place])  07/24/2016 Mammogram   Rt Breast 2 masses at 11-12:30 Position spanning 8 cm; Additional mass at 2 o clock: 1.5 cm; LN with cort thickening 2.7 cm    07/27/2016 Initial Diagnosis   Rt Biopsy 11oclock and 2 o clock: Grade 3 IDC with LVI; LN biopsy: Positive IDC; 11oclock: Er 100%, PR 90%, Ki 67: 90%; Her 2 Neg Heterogeneous; LN: Er 100%, PR: 90%; Ki67: 60%; Her 2 Pos Ratio 2.37; 2oclock: Er 100%, PR 90%; Ki 67: 90%; Her 2 Positive 2.52    08/16/2016 - 12/01/2016 Neo-Adjuvant Chemotherapy   Neoadjuvant chemotherapy with TCH Perjeta (Taxotere d/ced after 2 cycles)    12/06/2016 Breast MRI   Residual right breast 2 o'clock enhancing mass which now measures 1.3 cm in greatest dimension.Residual non measurable scattered foci of heterogeneous enhancement throughout the superior and subareolar right breast, anterior and middle depth. Diminished right axillary lymphadenopathy    01/03/2017 Surgery   Right mastectomy: Multiple IDC with micropapillary features, LVI, grade 3, 1.5, 0.6, 0.5 cm, 12/20 lymph nodes positive for cancer, margins negative, ER 100%, PR 90%, HER-2 positive, Ki-67 60%,ypT1c ypN3 RCB  class III; stage IIIB    02/12/2017 - 03/26/2017 Radiation Therapy   Adjuvant radiation therapy    04/06/2017 -  Anti-estrogen oral therapy   Anastrozole 1 mg by mouth daily, could not tolerate Neratinib; stopped 10/03/18 due to severe myalgias, switched to tamoxifen started 10/17/18      REVIEW OF SYSTEMS:   Constitutional: Denies fevers, chills or abnormal weight loss Eyes: Denies blurriness of vision Ears, nose, mouth, throat, and face: Denies mucositis or sore throat Respiratory: Denies cough, dyspnea or wheezes Cardiovascular: Denies palpitation, chest discomfort Gastrointestinal:  Denies nausea, heartburn or change in bowel habits Skin: Denies abnormal skin rashes Lymphatics: Denies new lymphadenopathy or easy bruising Neurological:Denies numbness, tingling or new weaknesses Behavioral/Psych: Mood is stable, no new changes  Extremities: No lower extremity edema Breast: *** denies any pain or lumps or nodules in either breasts All other systems were reviewed with the patient and are negative. Observations/Objective:     Assessment Plan:  No problem-specific Assessment & Plan notes found for this encounter.    I discussed the assessment and treatment plan with the patient. The patient was provided an opportunity to ask questions and all were answered. The patient agreed with the plan and demonstrated an understanding of the instructions. The patient was advised to call back or seek an in-person evaluation if the symptoms worsen or if the condition fails to improve as anticipated.   I provided *** minutes of non-face-to-face time during this encounter. Yeraldine Forney Bryson Ha, CMA   I Gardiner Coins am scribing for Dr. Lindi Adie  ***

## 2022-01-13 ENCOUNTER — Other Ambulatory Visit: Payer: Self-pay | Admitting: Family

## 2022-01-13 DIAGNOSIS — E039 Hypothyroidism, unspecified: Secondary | ICD-10-CM

## 2022-01-16 ENCOUNTER — Telehealth: Payer: Medicare HMO | Admitting: Hematology and Oncology

## 2022-02-17 ENCOUNTER — Encounter: Payer: Self-pay | Admitting: Family Medicine

## 2022-02-17 ENCOUNTER — Ambulatory Visit (INDEPENDENT_AMBULATORY_CARE_PROVIDER_SITE_OTHER): Payer: Medicare HMO | Admitting: Family Medicine

## 2022-02-17 VITALS — BP 124/78 | HR 100 | Temp 97.9°F | Resp 20 | Ht 67.0 in | Wt 187.0 lb

## 2022-02-17 DIAGNOSIS — L409 Psoriasis, unspecified: Secondary | ICD-10-CM | POA: Diagnosis not present

## 2022-02-17 DIAGNOSIS — I2699 Other pulmonary embolism without acute cor pulmonale: Secondary | ICD-10-CM

## 2022-02-17 MED ORDER — TRIAMCINOLONE ACETONIDE 0.5 % EX OINT: 1.0000 | TOPICAL_OINTMENT | Freq: Every day | CUTANEOUS | 1 refills | Status: DC

## 2022-02-17 MED ORDER — TRIAMCINOLONE ACETONIDE 0.1 % EX CREA
1.0000 | TOPICAL_CREAM | Freq: Every day | CUTANEOUS | 0 refills | Status: DC
Start: 2022-02-17 — End: 2022-04-18

## 2022-02-17 NOTE — Progress Notes (Unsigned)
   Acute Office Visit  Subjective:     Patient ID: Cynthia Chavez, female    DOB: 07-05-1956, 66 y.o.   MRN: [id number]  Chief Complaint  Patient presents with  . Rash    Right leg. Using betamethasone     HPI Patient is in today for ***  ROS      Objective:    BP 124/78   Pulse 100   Temp 97.9 F (36.6 C) (Temporal)   Resp 20   Ht '5\' 7"'$  (1.702 m)   Wt 187 lb (84.8 kg)   SpO2 94%   BMI 29.29 kg/m  {Vitals History (Optional):23777}  Physical Exam  No results found for any visits on 02/17/22.      Assessment & Plan:   Problem List Items Addressed This Visit   None   No orders of the defined types were placed in this encounter.   No follow-ups on file.  Gwenlyn Perking, FNP

## 2022-02-20 ENCOUNTER — Encounter: Payer: Self-pay | Admitting: Family Medicine

## 2022-02-24 ENCOUNTER — Other Ambulatory Visit: Payer: Self-pay | Admitting: Family Medicine

## 2022-03-20 ENCOUNTER — Telehealth: Payer: Self-pay | Admitting: Family Medicine

## 2022-03-20 MED ORDER — RIVAROXABAN 20 MG PO TABS: 20.0000 mg | ORAL_TABLET | Freq: Every day | ORAL | 0 refills | Status: DC

## 2022-03-20 NOTE — Telephone Encounter (Signed)
  Prescription Request  03/20/2022  Is this a "Controlled Substance" medicine? no  Have you seen your PCP in the last 2 weeks? No pt has appt with PCP 04/18/22, pt has one pill left needs enough medication until she is seen  If YES, route message to pool  -  If NO, patient needs to be scheduled for appointment.  What is the name of the medication or equipment? rivaroxaban (XARELTO) 20 MG TABS tablet   Have you contacted your pharmacy to request a refill? yes   Which pharmacy would you like this sent to? [facility]    Patient notified that their request is being sent to the clinical staff for review and that they should receive a response within 2 business days.

## 2022-03-20 NOTE — Addendum Note (Signed)
Addended by: Everlean Cherry on: 03/20/2022 04:43 PM   Modules accepted: Orders

## 2022-04-18 ENCOUNTER — Encounter: Payer: Self-pay | Admitting: Family Medicine

## 2022-04-18 ENCOUNTER — Ambulatory Visit (INDEPENDENT_AMBULATORY_CARE_PROVIDER_SITE_OTHER): Payer: Medicare HMO | Admitting: Family Medicine

## 2022-04-18 ENCOUNTER — Other Ambulatory Visit: Payer: Self-pay | Admitting: Family Medicine

## 2022-04-18 VITALS — BP 134/75 | HR 87 | Temp 97.0°F | Ht 67.0 in | Wt 184.6 lb

## 2022-04-18 DIAGNOSIS — L409 Psoriasis, unspecified: Secondary | ICD-10-CM

## 2022-04-18 DIAGNOSIS — Z86718 Personal history of other venous thrombosis and embolism: Secondary | ICD-10-CM

## 2022-04-18 DIAGNOSIS — E7800 Pure hypercholesterolemia, unspecified: Secondary | ICD-10-CM

## 2022-04-18 DIAGNOSIS — C50411 Malignant neoplasm of upper-outer quadrant of right female breast: Secondary | ICD-10-CM | POA: Diagnosis not present

## 2022-04-18 DIAGNOSIS — T781XXA Other adverse food reactions, not elsewhere classified, initial encounter: Secondary | ICD-10-CM

## 2022-04-18 DIAGNOSIS — E039 Hypothyroidism, unspecified: Secondary | ICD-10-CM | POA: Diagnosis not present

## 2022-04-18 DIAGNOSIS — E559 Vitamin D deficiency, unspecified: Secondary | ICD-10-CM | POA: Diagnosis not present

## 2022-04-18 DIAGNOSIS — Z170 Estrogen receptor positive status [ER+]: Secondary | ICD-10-CM | POA: Diagnosis not present

## 2022-04-18 MED ORDER — BETAMETHASONE DIPROPIONATE 0.05 % EX CREA: TOPICAL_CREAM | Freq: Two times a day (BID) | CUTANEOUS | 1 refills | Status: DC

## 2022-04-18 NOTE — Progress Notes (Signed)
Subjective: CC: Follow-up hypothyroidism PCP: Janora Norlander, DO [id number] Cynthia Chavez is a 67 y.o. female presenting to clinic today for:  1.  Hypothyroidism Patient is compliant with Synthroid 150 mcg daily.  She is due for refills but wants to get her labs done before renewing.  Would like med[facility] sent to [facility].  No trauma, heart palpitations, change in bowel habits or body weight.  No difficulty swallowing  2.  Breast cancer, h/o DVT Treated with Femara for breast cancer.  She is followed by Dr. Lindi Adie.  She is treated with Xarelto for DVT and suspected pulmonary embolism in December.  Fortunately contrast dye was not able to be utilized due to contrast allergy to further evaluate PE versus infection.  She notes that Xarelto has become quite costly.  This was her first blood clot.  3. Alpha gal/ Psoriasis Would like levels collected today  Has rash on RLE but this is her psoriasis which is not well controlled with triamcinolone right now.   ROS: Per HPI  Allergies  Allergen Reactions   Beef-Derived Products Hives, Shortness Of Breath, Swelling and Rash   Ivp Dye [Iodinated Contrast Media] Other (See Comments)    Chest pain   Other Shortness Of Breath, Swelling and Other (See Comments)    Versed or Fentanyl when used for endoscopy caused throat and tongue swelling   Augmentin [Amoxicillin-Pot Clavulanate]    Dilaudid [Hydromorphone Hcl]     PT had CP and felt shaky. Didn't like the feeling, but sx lessened w/in 10 min of IV administration   Codeine Other (See Comments)    spasms   Past Medical History:  Diagnosis Date   Anxiety    Breast cancer ([place])    Cancer ([place])    breast cancer   Depression    Edema    bilateral feet and leg swelling   GERD (gastroesophageal reflux disease)    History of hiatal hernia    History of radiation therapy 02/12/17- 03/26/17   Chest wall and regional nodes, right- 50 Gy in 25 fractions, Chest Wall, boost, right- 10 Gy in 5 fractions.     Hypothyroidism    Malaise    Personal history of chemotherapy    Personal history of radiation therapy    Port catheter in place 09/08/2016   Sprain of shoulder, right 11/23/2020   Thyroid disease    Varicose veins     Current Outpatient Medications:    acetaminophen (TYLENOL) 500 MG tablet, Take 1 tablet (500 mg total) by mouth every 6 (six) hours as needed., Disp: 30 tablet, Rfl: 0   docusate sodium (COLACE) 100 MG capsule, Take 1 capsule (100 mg total) by mouth 2 (two) times daily., Disp: 10 capsule, Rfl: 0   esomeprazole (NEXIUM) 40 MG capsule, Take 1 capsule (40 mg total) by mouth daily at 12 noon., Disp: 90 capsule, Rfl: 3   letrozole (FEMARA) 2.5 MG tablet, Take 1 tablet (2.5 mg total) by mouth daily. Start at three times a week for 2 weeks and then to daily, Disp: 90 tablet, Rfl: 3   levothyroxine (SYNTHROID) 150 MCG tablet, TAKE 1 TABLET DAILY BEFORE BREAKFAST, Disp: 90 tablet, Rfl: 0   methocarbamol (ROBAXIN) 500 MG tablet, Take 1 tablet (500 mg total) by mouth every 6 (six) hours as needed for muscle spasms., Disp: 30 tablet, Rfl: 0   rivaroxaban (XARELTO) 20 MG TABS tablet, Take 1 tablet (20 mg total) by mouth daily with supper. (NEEDS TO BE SEEN BEFORE  NEXT REFILL), Disp: 90 tablet, Rfl: 0   tamsulosin (FLOMAX) 0.4 MG CAPS capsule, Take 1 capsule (0.4 mg total) by mouth daily., Disp: 30 capsule, Rfl: 1   traMADol (ULTRAM) 50 MG tablet, Take 1 tablet (50 mg total) by mouth every 8 (eight) hours as needed for severe pain., Disp: 30 tablet, Rfl: 0   triamcinolone cream (KENALOG) 0.1 %, Apply 1 Application topically daily., Disp: 30 g, Rfl: 0   nicotine (NICODERM CQ - DOSED IN MG/24 HOURS) 14 mg/24hr patch, Place 1 patch (14 mg total) onto the skin daily. (Patient not taking: Reported on 04/18/2022), Disp: 28 patch, Rfl: 0 Social History   Socioeconomic History   Marital status: Married    Spouse name: Gwyndolyn Saxon   Number of children: 3   Years of education: 11   Highest education  level: 11th grade  Occupational History   Occupation: Scientist, water quality    Comment: [employer] part time  Tobacco Use   Smoking status: Every Day    Packs/day: 0.50    Years: 25.00    Total pack years: 12.50    Types: Cigarettes    Start date: 04/19/1993   Smokeless tobacco: Never   Tobacco comments:    she stopped smoking for a long time, but started back.   Vaping Use   Vaping Use: Never used  Substance and Sexual Activity   Alcohol use: No   Drug use: No   Sexual activity: Not on file  Other Topics Concern   Not on file  Social History Narrative   One level living at home with her husband.   Children live nearby - visits with family once or twice per week.   Active still working part time, working outdoors, gardening, Social research officer, government.   Social Determinants of Health   Financial Resource Strain: Low Risk  (05/13/2021)   Overall Financial Resource Strain (CARDIA)    Difficulty of Paying Living Expenses: Not hard at all  Food Insecurity: No Food Insecurity (05/13/2021)   Hunger Vital Sign    Worried About Running Out of Food in the Last Year: Never true    [place] in the Last Year: Never true  Transportation Needs: No Transportation Needs (05/13/2021)   PRAPARE - Hydrologist (Medical): No    Lack of Transportation (Non-Medical): No  Physical Activity: Sufficiently Active (05/13/2021)   Exercise Vital Sign    Days of Exercise per Week: 5 days    Minutes of Exercise per Session: 30 min  Stress: No Stress Concern Present (05/13/2021)   [place]    Feeling of Stress : Not at all  Social Connections: [place] (05/13/2021)   Social Connection and Isolation Panel [NHANES]    Frequency of Communication with Friends and Family: More than three times a week    Frequency of Social Gatherings with Friends and Family: Twice a week    Attends Religious Services: More than 4 times per year     Active Member of [employer] or Organizations: Yes    Attends Music therapist: More than 4 times per year    Marital Status: Married  Human resources officer Violence: Not At Risk (05/13/2021)   Humiliation, Afraid, Rape, and Kick questionnaire    Fear of Current or Ex-Partner: No    Emotionally Abused: No    Physically Abused: No    Sexually Abused: No   Family History  Problem Relation Age of  Onset   COPD Father    Cancer Father     Objective: Office vital signs reviewed. BP 134/75   Pulse 87   Temp (!) 97 F (36.1 C) (Temporal)   Ht '5\' 7"'  (1.702 m)   Wt 184 lb 9.6 oz (83.7 kg)   SpO2 95%   BMI 28.91 kg/m   Physical Examination:  General: Awake, alert, well nourished, No acute distress HEENT: Sclera white.  No exophthalmos Cardio: regular rate and rhythm, S1S2 heard, no murmurs appreciated Pulm: Rhonchorous breath sounds appreciated globally; normal work of breathing on room air Skin: Scaly lesions noted along the right lower leg no evidence of secondary bacterial infection  Assessment/ Plan: 66 y.o. female   Acquired hypothyroidism - Plan: CMP14+EGFR, TSH, T4, Free  Pure hypercholesterolemia - Plan: CMP14+EGFR, CANCELED: Lipid Panel  Malignant neoplasm of upper-outer quadrant of right breast in female, estrogen receptor positive ([place]) - Plan: CBC  Vitamin D deficiency - Plan: VITAMIN D 25 Hydroxy (Vit-D Deficiency, Fractures)  Psoriasis - Plan: CBC, betamethasone dipropionate 0.05 % cream  Allergic reaction to alpha-gal - Plan: Alpha-Gal Panel  History of DVT in adulthood  Check thyroid levels.  Patient nonfasting so only liver function tests collected today  Check CBC given known treatment for breast cancer  Vitamin D level collected  Betamethasone prescribed for uncontrolled psoriasis.  Stop triamcinolone  Check alpha gal levels per patient request  I question her need for ongoing Xarelto treatment.  I will CC Dr. Lindi Adie for input.  She has  completed 9 months of anticoagulation.  The previous breast cancer treatment was thought to potentially be causing since one of the side effects is clotting.  Since she has been changed off of this I think she may be able to come off the Xarelto and maybe we can alleviate some of the financial burden she has been experiencing from ongoing use of this medicine.  No orders of the defined types were placed in this encounter.  No orders of the defined types were placed in this encounter.    Janora Norlander, DO [place] [phone]

## 2022-04-20 LAB — CMP14+EGFR
ALT: 10 IU/L (ref 0–32)
AST: 12 IU/L (ref 0–40)
Albumin/Globulin Ratio: 1.7 (ref 1.2–2.2)
Albumin: 4 g/dL (ref 3.9–4.9)
Alkaline Phosphatase: 110 IU/L (ref 44–121)
BUN/Creatinine Ratio: 19 (ref 12–28)
BUN: 15 mg/dL (ref 8–27)
Bilirubin Total: 0.4 mg/dL (ref 0.0–1.2)
CO2: 24 mmol/L (ref 20–29)
Calcium: 9.1 mg/dL (ref 8.7–10.3)
Chloride: 102 mmol/L (ref 96–106)
Creatinine, Ser: 0.77 mg/dL (ref 0.57–1.00)
Globulin, Total: 2.4 g/dL (ref 1.5–4.5)
Glucose: 94 mg/dL (ref 70–99)
Potassium: 4.1 mmol/L (ref 3.5–5.2)
Sodium: 140 mmol/L (ref 134–144)
Total Protein: 6.4 g/dL (ref 6.0–8.5)
eGFR: 84 mL/min/{1.73_m2} (ref >59)

## 2022-04-20 LAB — CBC
Hematocrit: 39.7 % (ref 34.0–46.6)
Hemoglobin: 12.7 g/dL (ref 11.1–15.9)
MCH: 30 pg (ref 26.6–33.0)
MCHC: 32 g/dL (ref 31.5–35.7)
MCV: 94 fL (ref 79–97)
Platelets: 202 10*3/uL (ref 150–450)
RBC: 4.23 x10E6/uL (ref 3.77–5.28)
RDW: 12.8 % (ref 11.7–15.4)
WBC: 6.4 10*3/uL (ref 3.4–10.8)

## 2022-04-20 LAB — ALPHA-GAL PANEL
Allergen Lamb IgE: 17.1 kU/L — AB
Beef IgE: 42.1 kU/L — AB
IgE (Immunoglobulin E), Serum: 370 IU/mL (ref 6–495)
O215-IgE Alpha-Gal: 83.4 kU/L — AB
Pork IgE: 30.9 kU/L — AB

## 2022-04-20 LAB — T4, FREE: Free T4: 1.45 ng/dL (ref 0.82–1.77)

## 2022-04-20 LAB — VITAMIN D 25 HYDROXY (VIT D DEFICIENCY, FRACTURES): Vit D, 25-Hydroxy: 21.8 ng/mL — ABNORMAL LOW (ref 30.0–100.0)

## 2022-04-20 LAB — TSH: TSH: 0.132 u[IU]/mL — ABNORMAL LOW (ref 0.450–4.500)

## 2022-04-21 ENCOUNTER — Other Ambulatory Visit: Payer: Self-pay | Admitting: Family Medicine

## 2022-04-21 DIAGNOSIS — E039 Hypothyroidism, unspecified: Secondary | ICD-10-CM

## 2022-04-21 MED ORDER — LEVOTHYROXINE SODIUM 150 MCG PO TABS: 150.0000 ug | ORAL_TABLET | Freq: Every day | ORAL | 0 refills | Status: DC

## 2022-04-27 ENCOUNTER — Telehealth: Payer: Self-pay | Admitting: *Deleted

## 2022-04-27 DIAGNOSIS — C50411 Malignant neoplasm of upper-outer quadrant of right female breast: Secondary | ICD-10-CM

## 2022-04-27 MED ORDER — LETROZOLE 2.5 MG PO TABS: 2.5000 mg | ORAL_TABLET | Freq: Every day | ORAL | 1 refills | Status: DC

## 2022-04-27 NOTE — Telephone Encounter (Signed)
Patient requests Letrozole 2.5 refill for 90 day prescription be sent to [facility] in [place]. She said she has 4 pills left of current prescription. She does not want to receive med from [facility]. She said she still has 2 refills remaining from [facility] (called and confirmed), but she does not want to use them. Advised her that prescription will be sent to [facility] and requested she contact this office if any difficulty filling it. She verbalized understanding.

## 2022-05-15 ENCOUNTER — Telehealth: Payer: Self-pay | Admitting: Family Medicine

## 2022-06-02 ENCOUNTER — Ambulatory Visit (INDEPENDENT_AMBULATORY_CARE_PROVIDER_SITE_OTHER): Payer: Medicare HMO | Admitting: Nurse Practitioner

## 2022-06-02 ENCOUNTER — Encounter: Payer: Self-pay | Admitting: Nurse Practitioner

## 2022-06-02 DIAGNOSIS — J0111 Acute recurrent frontal sinusitis: Secondary | ICD-10-CM | POA: Diagnosis not present

## 2022-06-02 MED ORDER — DOXYCYCLINE HYCLATE 100 MG PO TABS: 100.0000 mg | ORAL_TABLET | Freq: Two times a day (BID) | ORAL | 0 refills | Status: DC

## 2022-06-02 MED ORDER — FLUTICASONE PROPIONATE 50 MCG/ACT NA SUSP: 2.0000 | Freq: Every day | NASAL | 6 refills | Status: DC

## 2022-06-02 MED ORDER — GUAIFENESIN ER 600 MG PO TB12: 600.0000 mg | ORAL_TABLET | Freq: Two times a day (BID) | ORAL | 0 refills | Status: DC

## 2022-06-02 NOTE — Progress Notes (Signed)
   Virtual Visit  Note Due to COVID-19 pandemic this visit was conducted virtually. This visit type was conducted due to national recommendations for restrictions regarding the COVID-19 Pandemic (e.g. social distancing, sheltering in place) in an effort to limit this patient's exposure and mitigate transmission in our community. All issues noted in this document were discussed and addressed.  A physical exam was not performed with this format.  I connected with Cynthia Chavez on 06/02/22 at 12;15 pm  by telephone and verified that I am speaking with the correct person using two identifiers. Cynthia Chavez is currently located at home during visit. The provider, Ivy Lynn, NP is located in their office at time of visit.  I discussed the limitations, risks, security and privacy concerns of performing an evaluation and management service by telephone and the availability of in person appointments. I also discussed with the patient that there may be a patient responsible charge related to this service. The patient expressed understanding and agreed to proceed.   History and Present Illness:  Sinusitis This is a recurrent problem. Episode onset: in the pas 4-5 days. The problem is unchanged. There has been no fever. The pain is moderate. Associated symptoms include congestion, coughing and sinus pressure. Pertinent negatives include no chills or sore throat. Past treatments include acetaminophen. The treatment provided mild relief.      Review of Systems  Constitutional: Negative.  Negative for chills, fever and malaise/fatigue.  HENT:  Positive for congestion, sinus pressure and sinus pain. Negative for sore throat.   Respiratory:  Positive for cough.   Skin: Negative.  Negative for itching and rash.  All other systems reviewed and are negative.    Observations/Objective: Televisit patient not in distress.  Assessment and Plan: Patient presents with acute frontal sinusitis with no fever  or body ache. Take meds as prescribed - Use a cool mist humidifier  -Use saline nose sprays frequently -Force fluids -For fever or aches or pains- take Tylenol or ibuprofen. -If symptoms do not improve, she may need to be COVID tested to rule this out   Follow Up Instructions: Follow-up for worsening or unresolved symptoms    I discussed the assessment and treatment plan with the patient. The patient was provided an opportunity to ask questions and all were answered. The patient agreed with the plan and demonstrated an understanding of the instructions.   The patient was advised to call back or seek an in-person evaluation if the symptoms worsen or if the condition fails to improve as anticipated.  The above assessment and management plan was discussed with the patient. The patient verbalized understanding of and has agreed to the management plan. Patient is aware to call the clinic if symptoms persist or worsen. Patient is aware when to return to the clinic for a follow-up visit. Patient educated on when it is appropriate to go to the emergency department.   Time call ended: 12:26 PM  I provided 11 minutes of  non face-to-face time during this encounter.    Ivy Lynn, NP

## 2022-06-12 ENCOUNTER — Ambulatory Visit (INDEPENDENT_AMBULATORY_CARE_PROVIDER_SITE_OTHER): Payer: Medicare HMO

## 2022-06-12 VITALS — Ht 67.0 in | Wt 183.0 lb

## 2022-06-12 DIAGNOSIS — Z0000 Encounter for general adult medical examination without abnormal findings: Secondary | ICD-10-CM

## 2022-06-12 NOTE — Progress Notes (Signed)
Subjective:   Cynthia Chavez is a 66 y.o. female who presents for Medicare Annual (Subsequent) preventive examination.   I connected with  Cynthia Chavez on 06/12/22 by a audio enabled telemedicine application and verified that I am speaking with the correct person using two identifiers.  Patient Location: Home  Provider Location: Home Office  I discussed the limitations of evaluation and management by telemedicine. The patient expressed understanding and agreed to proceed.  Review of Systems     Cardiac Risk Factors include: advanced age (>60mn, >>73women)     Objective:    Today's Vitals   06/12/22 1034  Weight: 183 lb (83 kg)  Height: '5\' 7"'$  (1.702 m)   Body mass index is 28.66 kg/m.     06/12/2022   10:38 AM 07/25/2021   12:45 PM 07/25/2021    6:34 AM 05/13/2021   10:49 AM 10/17/2020   11:36 AM 08/11/2019   10:52 AM 09/19/2017    1:53 PM  Advanced Directives  Does Patient Have a Medical Advance Directive? No No No No No No No  Would patient like information on creating a medical advance directive? No - Patient declined No - Patient declined No - Patient declined No - Patient declined No - Patient declined Yes (MAU/Ambulatory/Procedural Areas - Information given) No - Patient declined    Current Medications (verified) Outpatient Encounter Medications as of 06/12/2022  Medication Sig   acetaminophen (TYLENOL) 500 MG tablet Take 1 tablet (500 mg total) by mouth every 6 (six) hours as needed.   betamethasone dipropionate 0.05 % cream Apply topically 2 (two) times daily. As needed for psoriasis   docusate sodium (COLACE) 100 MG capsule Take 1 capsule (100 mg total) by mouth 2 (two) times daily.   doxycycline (VIBRA-TABS) 100 MG tablet Take 1 tablet (100 mg total) by mouth 2 (two) times daily.   esomeprazole (NEXIUM) 40 MG capsule Take 1 capsule (40 mg total) by mouth daily at 12 noon.   fluticasone (FLONASE) 50 MCG/ACT nasal spray Place 2 sprays into both nostrils daily.    guaiFENesin (MUCINEX) 600 MG 12 hr tablet Take 1 tablet (600 mg total) by mouth 2 (two) times daily.   letrozole (FEMARA) 2.5 MG tablet Take 1 tablet (2.5 mg total) by mouth daily.   levothyroxine (SYNTHROID) 150 MCG tablet Take 1 tablet (150 mcg total) by mouth daily before breakfast. Except 1/2 tablet on Sundays   methocarbamol (ROBAXIN) 500 MG tablet Take 1 tablet (500 mg total) by mouth every 6 (six) hours as needed for muscle spasms.   nicotine (NICODERM CQ - DOSED IN MG/24 HOURS) 14 mg/24hr patch Place 1 patch (14 mg total) onto the skin daily.   tamsulosin (FLOMAX) 0.4 MG CAPS capsule Take 1 capsule (0.4 mg total) by mouth daily.   traMADol (ULTRAM) 50 MG tablet Take 1 tablet (50 mg total) by mouth every 8 (eight) hours as needed for severe pain.   No facility-administered encounter medications on file as of 06/12/2022.    Allergies (verified) Beef-derived products, Ivp dye [iodinated contrast media], Other, Augmentin [amoxicillin-pot clavulanate], Dilaudid [hydromorphone hcl], and Codeine   History: Past Medical History:  Diagnosis Date   Anxiety    [place]   Depression    Edema    bilateral feet and leg swelling   GERD (gastroesophageal reflux disease)    History of hiatal hernia    History of radiation therapy 02/12/17-  03/26/17   Chest wall and regional nodes, right- 50 Gy in 25 fractions, Chest Wall, boost, right- 10 Gy in 5 fractions.    Hypothyroidism    Malaise    Personal history of chemotherapy    Personal history of radiation therapy    Port catheter in place 09/08/2016   Sprain of shoulder, right 11/23/2020   Thyroid disease    Varicose veins    Past Surgical History:  Procedure Laterality Date   ABDOMINAL HYSTERECTOMY     AXILLARY LYMPH NODE BIOPSY Right 01/03/2017   Procedure: RIGHT AXILLARY LYMPH NODE SEED GUIDED EXCISIONAL BIOPSY;  Surgeon: Rolm Bookbinder, MD;  Location: [facility];  Service: General;  Laterality:  Right;   BREAST SURGERY     CHOLECYSTECTOMY     COLONOSCOPY     ESOPHAGEAL DILATION     MASTECTOMY Right    MASTECTOMY W/ SENTINEL NODE BIOPSY Right 01/03/2017   axillary   MASTECTOMY W/ SENTINEL NODE BIOPSY Right 01/03/2017   Procedure: RIGHT TOTAL MASTECTOMY WITH RIGHT AXILLARY SENTINEL LYMPH NODE BIOPSY;  Surgeon: Rolm Bookbinder, MD;  Location: [facility];  Service: General;  Laterality: Right;   PORTACATH PLACEMENT Left 08/17/2016   Procedure: INSERTION PORT-A-CATH WITH [place];  Surgeon: Rolm Bookbinder, MD;  Location: [facility];  Service: General;  Laterality: Left;   Family History  Problem Relation Age of Onset   COPD Father    Cancer Father    Social History   Socioeconomic History   Marital status: Married    Spouse name: Gwyndolyn Saxon   Number of children: 3   Years of education: 11   Highest education level: 11th grade  Occupational History   Occupation: Scientist, water quality    Comment: [employer] part time  Tobacco Use   Smoking status: Every Day    Packs/day: 0.50    Years: 25.00    Total pack years: 12.50    Types: Cigarettes    Start date: 04/19/1993   Smokeless tobacco: Never   Tobacco comments:    she stopped smoking for a long time, but started back.   Vaping Use   Vaping Use: Never used  Substance and Sexual Activity   Alcohol use: No   Drug use: No   Sexual activity: Not on file  Other Topics Concern   Not on file  Social History Narrative   One level living at home with her husband.   Children live nearby - visits with family once or twice per week.   Active still working part time, working outdoors, gardening, Social research officer, government.   Social Determinants of Health   Financial Resource Strain: Low Risk  (06/12/2022)   Overall Financial Resource Strain (CARDIA)    Difficulty of Paying Living Expenses: Not hard at all  Food Insecurity: No Food Insecurity (06/12/2022)   Hunger Vital Sign    Worried About Running Out of Food in the Last Year: Never true    [place] in the Last Year: Never true  Transportation Needs: No Transportation Needs (06/12/2022)   PRAPARE - Hydrologist (Medical): No    Lack of Transportation (Non-Medical): No  Physical Activity: Sufficiently Active (06/12/2022)   Exercise Vital Sign    Days of Exercise per Week: 5 days    Minutes of Exercise per Session: 30 min  Stress: No Stress Concern Present (06/12/2022)   [place]    Feeling of Stress : Not at  all  Social Connections: Socially Integrated (06/12/2022)   Social Connection and Isolation Panel [NHANES]    Frequency of Communication with Friends and Family: More than three times a week    Frequency of Social Gatherings with Friends and Family: More than three times a week    Attends Religious Services: More than 4 times per year    Active Member of [employer] or Organizations: Yes    Attends Music therapist: More than 4 times per year    Marital Status: Married    Tobacco Counseling Ready to quit: Not Answered Counseling given: Not Answered Tobacco comments: she stopped smoking for a long time, but started back.    Clinical Intake:  Pre-visit preparation completed: Yes  Pain : No/denies pain     Nutritional Risks: None Diabetes: No  How often do you need to have someone help you when you read instructions, pamphlets, or other written materials from your doctor or pharmacy?: 1 - Never  Diabetic?no   Interpreter Needed?: No  Information entered by :: Jadene Pierini, LPN   Activities of Daily Living    06/12/2022   10:38 AM 07/25/2021   12:45 PM  In your present state of health, do you have any difficulty performing the following activities:  Hearing? 0 0  Vision? 0 0  Difficulty concentrating or making decisions? 0 0  Walking or climbing stairs? 0 0  Dressing or bathing? 0 0  Doing errands, shopping? 0 0  Preparing Food and eating ? N    Using the Toilet? N   In the past six months, have you accidently leaked urine? N   Do you have problems with loss of bowel control? N   Managing your Medications? N   Managing your Finances? N   Housekeeping or managing your Housekeeping? N     Patient Care Team: Janora Norlander, DO as PCP - General (Family Medicine) Lavonna Monarch, MD (Inactive) as Consulting Physician (Dermatology)  Indicate any recent Medical Services you may have received from other than [facility] providers in the past year (date may be approximate).     Assessment:   This is a routine wellness examination for [place].  Hearing/Vision screen Vision Screening - Comments:: Due Annual eye exams wear reading glasses   Dietary issues and exercise activities discussed: Current Exercise Habits: Home exercise routine, Type of exercise: walking, Time (Minutes): 30, Frequency (Times/Week): 5, Weekly Exercise (Minutes/Week): 150, Intensity: Mild, Exercise limited by: None identified   Goals Addressed             This Visit's Progress    DIET - DECREASE SODA OR JUICE INTAKE   On track      Depression Screen    06/12/2022   10:37 AM 04/18/2022   11:28 AM 02/17/2022   11:15 AM 08/10/2021    9:11 AM 05/13/2021   10:51 AM 05/03/2021   11:12 AM 11/23/2020   10:33 AM  PHQ 2/9 Scores  PHQ - 2 Score 0 0 0 0 0 0 0  PHQ- 9 Score 0 0 0    0    Fall Risk    06/12/2022   10:35 AM 04/18/2022   11:28 AM 02/17/2022   11:15 AM 08/10/2021    9:11 AM 05/13/2021   10:48 AM  Fall Risk   Falls in the past year? 0 0 0 0 0  Number falls in past yr: 0    0  Injury with Fall? 0    0  Risk for fall due to : No Fall Risks    No Fall Risks  Follow up Falls prevention discussed    Falls prevention discussed    FALL RISK PREVENTION PERTAINING TO THE HOME:  Any stairs in or around the home? No  If so, are there any without handrails? No  Home free of loose throw rugs in walkways, pet beds, electrical cords, etc? Yes  Adequate lighting  in your home to reduce risk of falls? Yes   ASSISTIVE DEVICES UTILIZED TO PREVENT FALLS:  Life alert? No  Use of a cane, walker or w/c? No  Grab bars in the bathroom? No  Shower chair or bench in shower? No  Elevated toilet seat or a handicapped toilet? No          06/12/2022   10:38 AM 05/13/2021   10:41 AM 08/11/2019   11:26 AM 08/11/2019   10:55 AM  6CIT Screen  What Year? 0 points 0 points 0 points 0 points  What month? 0 points 0 points 0 points 0 points  What time? 0 points 0 points 0 points 0 points  Count back from 20 0 points 0 points 0 points 0 points  Months in reverse 0 points 2 points 0 points 0 points  Repeat phrase 0 points 6 points 6 points 6 points  Total Score 0 points 8 points 6 points 6 points    Immunizations Immunization History  Administered Date(s) Administered   Influenza Split 05/19/2020   Moderna Sars-Covid-2 Vaccination 09/17/2019, 10/15/2019, 04/16/2020    TDAP status: Due, Education has been provided regarding the importance of this vaccine. Advised may receive this vaccine at local pharmacy or Health Dept. Aware to provide a copy of the vaccination record if obtained from local pharmacy or Health Dept. Verbalized acceptance and understanding.  Flu Vaccine status: Due, Education has been provided regarding the importance of this vaccine. Advised may receive this vaccine at local pharmacy or Health Dept. Aware to provide a copy of the vaccination record if obtained from local pharmacy or Health Dept. Verbalized acceptance and understanding.  Pneumococcal vaccine status: Due, Education has been provided regarding the importance of this vaccine. Advised may receive this vaccine at local pharmacy or Health Dept. Aware to provide a copy of the vaccination record if obtained from local pharmacy or Health Dept. Verbalized acceptance and understanding.  Covid-19 vaccine status: Completed vaccines  Qualifies for Shingles Vaccine? Yes   Zostavax completed No    Shingrix Completed?: No.    Education has been provided regarding the importance of this vaccine. Patient has been advised to call insurance company to determine out of pocket expense if they have not yet received this vaccine. Advised may also receive vaccine at local pharmacy or Health Dept. Verbalized acceptance and understanding.  Screening Tests Health Maintenance  Topic Date Due   COVID-19 Vaccine (4 - Moderna risk series) 06/11/2020   Zoster Vaccines- Shingrix (1 of 2) 07/18/2022 (Originally 08/21/1974)   INFLUENZA VACCINE  11/05/2022 (Originally 03/07/2022)   Pneumonia Vaccine 25+ Years old (1 - PCV) 04/19/2023 (Originally 08/22/1961)   DEXA SCAN  04/19/2023 (Originally 08/21/2018)   COLONOSCOPY (Pts 45-57yr Insurance coverage will need to be confirmed)  04/19/2023 (Originally 08/21/1998)   MAMMOGRAM  08/15/2022   Medicare Annual Wellness (AWV)  06/13/2023   Hepatitis C Screening  Completed   HPV VACCINES  Aged Out   TETANUS/TDAP  Discontinued    Health Maintenance  Health Maintenance Due  Topic Date Due  COVID-19 Vaccine (4 - Moderna risk series) 06/11/2020    Colorectal cancer screening: Referral to GI placed Patient declined . Pt aware the office will call re: appt.  Mammogram status: Completed 08/15/2021. Repeat every year  Bone Density status: Ordered Patient declines at this time . Pt provided with contact info and advised to call to schedule appt.  Lung Cancer Screening: (Low Dose CT Chest recommended if Age 60-80 years, 30 pack-year currently smoking OR have quit w/in 15years.) does not qualify.   Lung Cancer Screening Referral: n/a  Additional Screening:  Hepatitis C Screening: does not qualify;  Vision Screening: Recommended annual ophthalmology exams for early detection of glaucoma and other disorders of the eye. Is the patient up to date with their annual eye exam?  No  Who is the provider or what is the name of the office in which the patient attends annual  eye exams? None declines referral  If pt is not established with a provider, would they like to be referred to a provider to establish care? No .   Dental Screening: Recommended annual dental exams for proper oral hygiene  Community Resource Referral / Chronic Care Management: CRR required this visit?  No   CCM required this visit?  No      Plan:     I have personally reviewed and noted the following in the patient's chart:   Medical and social history Use of alcohol, tobacco or illicit drugs  Current medications and supplements including opioid prescriptions. Patient is not currently taking opioid prescriptions. Functional ability and status Nutritional status Physical activity Advanced directives List of other physicians Hospitalizations, surgeries, and ER visits in previous 12 months Vitals Screenings to include cognitive, depression, and falls Referrals and appointments  In addition, I have reviewed and discussed with patient certain preventive protocols, quality metrics, and best practice recommendations. A written personalized care plan for preventive services as well as general preventive health recommendations were provided to patient.     Daphane Shepherd, LPN   81/11/[id number]   Nurse Notes: Due TDAP , Eye exam , Colonoscopy and Dexa

## 2022-06-12 NOTE — Patient Instructions (Signed)
Cynthia Chavez , Thank you for taking time to come for your Medicare Wellness Visit. I appreciate your ongoing commitment to your health goals. Please review the following plan we discussed and let me know if I can assist you in the future.   These are the goals we discussed:  Goals      DIET - DECREASE SODA OR JUICE INTAKE        This is a list of the screening recommended for you and due dates:  Health Maintenance  Topic Date Due   COVID-19 Vaccine (4 - Moderna risk series) 06/11/2020   Zoster (Shingles) Vaccine (1 of 2) 07/18/2022*   Flu Shot  11/05/2022*   Pneumonia Vaccine (1 - PCV) 04/19/2023*   DEXA scan (bone density measurement)  04/19/2023*   Colon Cancer Screening  04/19/2023*   Mammogram  08/15/2022   Medicare Annual Wellness Visit  06/13/2023   Hepatitis C Screening: USPSTF Recommendation to screen - Ages 18-79 yo.  Completed   HPV Vaccine  Aged Out   Tetanus Vaccine  Discontinued  *Topic was postponed. The date shown is not the original due date.    Advanced directives: Advance directive discussed with you today. I have provided a copy for you to complete at home and have notarized. Once this is complete please bring a copy in to our office so we can scan it into your chart.   Conditions/risks identified: Aim for 30 minutes of exercise or brisk walking, 6-8 glasses of water, and 5 servings of fruits and vegetables each day.   Next appointment: Follow up in one year for your annual wellness visit    Preventive Care 65 Years and Older, Female Preventive care refers to lifestyle choices and visits with your health care provider that can promote health and wellness. What does preventive care include? A yearly physical exam. This is also called an annual well check. Dental exams once or twice a year. Routine eye exams. Ask your health care provider how often you should have your eyes checked. Personal lifestyle choices, including: Daily care of your teeth and  gums. Regular physical activity. Eating a healthy diet. Avoiding tobacco and drug use. Limiting alcohol use. Practicing safe sex. Taking low-dose aspirin every day. Taking vitamin and mineral supplements as recommended by your health care provider. What happens during an annual well check? The services and screenings done by your health care provider during your annual well check will depend on your age, overall health, lifestyle risk factors, and family history of disease. Counseling  Your health care provider may ask you questions about your: Alcohol use. Tobacco use. Drug use. Emotional well-being. Home and relationship well-being. Sexual activity. Eating habits. History of falls. Memory and ability to understand (cognition). Work and work Statistician. Reproductive health. Screening  You may have the following tests or measurements: Height, weight, and BMI. Blood pressure. Lipid and cholesterol levels. These may be checked every 5 years, or more frequently if you are over 20 years old. Skin check. Lung cancer screening. You may have this screening every year starting at age 62 if you have a 30-pack-year history of smoking and currently smoke or have quit within the past 15 years. Fecal occult blood test (FOBT) of the stool. You may have this test every year starting at age 72. Flexible sigmoidoscopy or colonoscopy. You may have a sigmoidoscopy every 5 years or a colonoscopy every 10 years starting at age 47. Hepatitis C blood test. Hepatitis B blood test. Sexually transmitted disease (  STD) testing. Diabetes screening. This is done by checking your blood sugar (glucose) after you have not eaten for a while (fasting). You may have this done every 1-3 years. Bone density scan. This is done to screen for osteoporosis. You may have this done starting at age 1. Mammogram. This may be done every 1-2 years. Talk to your health care provider about how often you should have regular  mammograms. Talk with your health care provider about your test results, treatment options, and if necessary, the need for more tests. Vaccines  Your health care provider may recommend certain vaccines, such as: Influenza vaccine. This is recommended every year. Tetanus, diphtheria, and acellular pertussis (Tdap, Td) vaccine. You may need a Td booster every 10 years. Zoster vaccine. You may need this after age 37. Pneumococcal 13-valent conjugate (PCV13) vaccine. One dose is recommended after age 47. Pneumococcal polysaccharide (PPSV23) vaccine. One dose is recommended after age 25. Talk to your health care provider about which screenings and vaccines you need and how often you need them. This information is not intended to replace advice given to you by your health care provider. Make sure you discuss any questions you have with your health care provider. Document Released: 08/20/2015 Document Revised: 04/12/2016 Document Reviewed: 05/25/2015 Elsevier Interactive Patient Education  2017 [place] Prevention in the Home Falls can cause injuries. They can happen to people of all ages. There are many things you can do to make your home safe and to help prevent falls. What can I do on the outside of my home? Regularly fix the edges of walkways and driveways and fix any cracks. Remove anything that might make you trip as you walk through a door, such as a raised step or threshold. Trim any bushes or trees on the path to your home. Use bright outdoor lighting. Clear any walking paths of anything that might make someone trip, such as rocks or tools. Regularly check to see if handrails are loose or broken. Make sure that both sides of any steps have handrails. Any raised decks and porches should have guardrails on the edges. Have any leaves, snow, or ice cleared regularly. Use sand or salt on walking paths during winter. Clean up any spills in your garage right away. This includes oil  or grease spills. What can I do in the bathroom? Use night lights. Install grab bars by the toilet and in the tub and shower. Do not use towel bars as grab bars. Use non-skid mats or decals in the tub or shower. If you need to sit down in the shower, use a plastic, non-slip stool. Keep the floor dry. Clean up any water that spills on the floor as soon as it happens. Remove soap buildup in the tub or shower regularly. Attach bath mats securely with double-sided non-slip rug tape. Do not have throw rugs and other things on the floor that can make you trip. What can I do in the bedroom? Use night lights. Make sure that you have a light by your bed that is easy to reach. Do not use any sheets or blankets that are too big for your bed. They should not hang down onto the floor. Have a firm chair that has side arms. You can use this for support while you get dressed. Do not have throw rugs and other things on the floor that can make you trip. What can I do in the kitchen? Clean up any spills right away. Avoid walking on  wet floors. Keep items that you use a lot in easy-to-reach places. If you need to reach something above you, use a strong step stool that has a grab bar. Keep electrical cords out of the way. Do not use floor polish or wax that makes floors slippery. If you must use wax, use non-skid floor wax. Do not have throw rugs and other things on the floor that can make you trip. What can I do with my stairs? Do not leave any items on the stairs. Make sure that there are handrails on both sides of the stairs and use them. Fix handrails that are broken or loose. Make sure that handrails are as long as the stairways. Check any carpeting to make sure that it is firmly attached to the stairs. Fix any carpet that is loose or worn. Avoid having throw rugs at the top or bottom of the stairs. If you do have throw rugs, attach them to the floor with carpet tape. Make sure that you have a light  switch at the top of the stairs and the bottom of the stairs. If you do not have them, ask someone to add them for you. What else can I do to help prevent falls? Wear shoes that: Do not have high heels. Have rubber bottoms. Are comfortable and fit you well. Are closed at the toe. Do not wear sandals. If you use a stepladder: Make sure that it is fully opened. Do not climb a closed stepladder. Make sure that both sides of the stepladder are locked into place. Ask someone to hold it for you, if possible. Clearly mark and make sure that you can see: Any grab bars or handrails. First and last steps. Where the edge of each step is. Use tools that help you move around (mobility aids) if they are needed. These include: Canes. Walkers. Scooters. Crutches. Turn on the lights when you go into a dark area. Replace any light bulbs as soon as they burn out. Set up your furniture so you have a clear path. Avoid moving your furniture around. If any of your floors are uneven, fix them. If there are any pets around you, be aware of where they are. Review your medicines with your doctor. Some medicines can make you feel dizzy. This can increase your chance of falling. Ask your doctor what other things that you can do to help prevent falls. This information is not intended to replace advice given to you by your health care provider. Make sure you discuss any questions you have with your health care provider. Document Released: 05/20/2009 Document Revised: 12/30/2015 Document Reviewed: 08/28/2014 Elsevier Interactive Patient Education  2017 [employer].

## 2022-07-13 ENCOUNTER — Other Ambulatory Visit: Payer: Self-pay | Admitting: General Surgery

## 2022-07-13 DIAGNOSIS — Z1231 Encounter for screening mammogram for malignant neoplasm of breast: Secondary | ICD-10-CM

## 2022-09-01 ENCOUNTER — Other Ambulatory Visit: Payer: Self-pay | Admitting: Family Medicine

## 2022-09-01 DIAGNOSIS — E039 Hypothyroidism, unspecified: Secondary | ICD-10-CM

## 2022-09-07 ENCOUNTER — Ambulatory Visit: Payer: Medicare HMO

## 2022-09-08 ENCOUNTER — Ambulatory Visit
Admission: RE | Admit: 2022-09-08 | Discharge: 2022-09-08 | Disposition: A | Discharge status: Home / Self Care | Payer: Medicare HMO | Source: Ambulatory Visit | Attending: General Surgery | Admitting: General Surgery

## 2022-09-08 DIAGNOSIS — Z1231 Encounter for screening mammogram for malignant neoplasm of breast: Secondary | ICD-10-CM | POA: Diagnosis not present

## 2022-10-09 ENCOUNTER — Inpatient Hospital Stay: Payer: Medicare HMO | Attending: Hematology and Oncology | Admitting: Hematology and Oncology

## 2022-10-09 NOTE — Assessment & Plan Note (Deleted)
Rt Biopsy 11oclock and 2 o clock: Grade 3 IDC with LVI; LN biopsy: Positive IDC; 11oclock: Er 100%, PR 90%, Ki 67: 90%; Her 2 Neg Heterogeneous; LN: Er 100%, PR: 90%; Ki67: 60%; Her 2 Pos Ratio 2.37; 2oclock: Er 100%, PR 90%; Ki 67: 90%; Her 2 Positive 2.52 Breast MRI on 08/13/2016: 8 cm span of breast cancer with multiple enlarged axillary lymph nodes. CT CAP: 08/17/16: 4 mm right middle lobe pulmonary nodule unlikely to be metastatic disease Bone scan 08/17/2016: No evidence of metastatic disease   Echocardiogram performed on 05/17/2017 showed a left ventricular ejection fraction of 60-65%.   Treatment summary:  1. Neoadjuvant chemotherapy with TCH Perjeta 6 cycles given 08/16/16 to 12/01/16 followed by Herceptin Perjeta maintenance for 1 year  2. Followed by mastectomy with axillary node dissection 01/03/2017 3. Adjuvant radiation therapy completed 03/26/2017 4. Followed by Anti-estrogen therapy started 04/06/2017 5. Followed by Neratinib for 1 year started 09/07/2017 -------------------------------------------------------------------------------------------------------------------------------------------------------------------------------------------------------------------------------------- 01/04/2007 Right mastectomy: Multiple IDC with micropapillary features, LVI, grade 3, 1.5, 0.6, 0.5 cm, 12/20 lymph nodes positive for cancer, margins negative, ER 100%, PR 90%, HER-2 positive, Ki-67 60%,ypT1c ypN3 RCB class III; stage IIIB   Adjuvant radiation therapy started 02/12/2017 completed 03/26/2017   Current treatment: Neratinib (started 09/01/17 discontinued 10/04/2017) and Anastrozole started 04/06/17 switched to tamoxifen 10/03/2018 switched to letrozole 10/06/2021   History of DVT and PE December 2022: Took 6 months of anticoagulation  Letrozole toxicities:  Breast cancer surveillance: 1.  Left breast mammogram: 09/12/2022 benign breast density category B 2.  Breast exam: Benign 10/09/2022    Return to clinic in 1 year for follow-up

## 2022-11-14 ENCOUNTER — Other Ambulatory Visit: Payer: Self-pay | Admitting: Hematology and Oncology

## 2022-11-14 ENCOUNTER — Telehealth: Payer: Self-pay | Admitting: Hematology and Oncology

## 2022-11-14 DIAGNOSIS — C50411 Malignant neoplasm of upper-outer quadrant of right female breast: Secondary | ICD-10-CM

## 2022-11-14 NOTE — Telephone Encounter (Signed)
Scheduled appointment per staff message. Patient is aware of the made appointment. 

## 2022-12-06 ENCOUNTER — Inpatient Hospital Stay: Payer: Medicare HMO | Attending: Hematology and Oncology | Admitting: Hematology and Oncology

## 2022-12-06 DIAGNOSIS — Z170 Estrogen receptor positive status [ER+]: Secondary | ICD-10-CM | POA: Diagnosis not present

## 2022-12-06 DIAGNOSIS — C50411 Malignant neoplasm of upper-outer quadrant of right female breast: Secondary | ICD-10-CM | POA: Diagnosis not present

## 2022-12-06 MED ORDER — TRIAMCINOLONE ACETONIDE 0.5 % EX OINT: 1.0000 | TOPICAL_OINTMENT | Freq: Two times a day (BID) | CUTANEOUS | 2 refills | Status: DC

## 2022-12-06 NOTE — Assessment & Plan Note (Signed)
Rt Biopsy 11oclock and 2 o clock: Grade 3 IDC with LVI; LN biopsy: Positive IDC; 11oclock: Er 100%, PR 90%, Ki 67: 90%; Her 2 Neg Heterogeneous; LN: Er 100%, PR: 90%; Ki67: 60%; Her 2 Pos Ratio 2.37; 2oclock: Er 100%, PR 90%; Ki 67: 90%; Her 2 Positive 2.52 Breast MRI on 08/13/2016: 8 cm span of breast cancer with multiple enlarged axillary lymph nodes. CT CAP: 08/17/16: 4 mm right middle lobe pulmonary nodule unlikely to be metastatic disease Bone scan 08/17/2016: No evidence of metastatic disease   Echocardiogram performed on 05/17/2017 showed a left ventricular ejection fraction of 60-65%.   Treatment summary:  1. Neoadjuvant chemotherapy with TCH Perjeta 6 cycles given 08/16/16 to 12/01/16 followed by Herceptin Perjeta maintenance for 1 year  2. Followed by mastectomy with axillary node dissection 01/03/2017 3. Adjuvant radiation therapy completed 03/26/2017 4. Followed by Anti-estrogen therapy started 04/06/2017 5. Followed by Neratinib for 1 year started 09/07/2017 -------------------------------------------------------------------------------------------------------------------------------------------------------------------------------------------------------------------------------------- 01/04/2007 Right mastectomy: Multiple IDC with micropapillary features, LVI, grade 3, 1.5, 0.6, 0.5 cm, 12/20 lymph nodes positive for cancer, margins negative, ER 100%, PR 90%, HER-2 positive, Ki-67 60%,ypT1c ypN3 RCB class III; stage IIIB   Adjuvant radiation therapy started 02/12/2017 completed 03/26/2017   Current treatment: Neratinib (started 09/01/17 discontinued 10/04/2017) and Anastrozole started 04/06/17 switched to tamoxifen 10/03/2018 switched to letrozole due to DVT December 2022   Letrozole toxicities:  History of DVT and PE December 2022: I recommended discontinuation of tamoxifen and switching her back to letrozole.   Breast cancer surveillance: 1.  Left breast mammogram: 09/12/2022 benign  breast density category B 2.  Breast exam: Benign 12/06/2022 3.  CT chest performed for lung nodule evaluation 07/25/2021: 4 mm lung nodule   Return to clinic in 1 year for follow-up

## 2022-12-06 NOTE — Progress Notes (Signed)
HEMATOLOGY-ONCOLOGY TELEPHONE VISIT PROGRESS NOTE  I connected with our patient on 12/06/22 at  9:15 AM EDT by telephone and verified that I am speaking with the correct person using two identifiers.  I discussed the limitations, risks, security and privacy concerns of performing an evaluation and management service by telephone and the availability of in person appointments.  I also discussed with the patient that there may be a patient responsible charge related to this service. The patient expressed understanding and agreed to proceed.   History of Present Illness: Cynthia Chavez is a 67 y.o. with a history of antiestrogen therapy with anastrozole.  She presents to the clinic for a telephone follow-up. Tolerating Letrozole very well. Has sweet cravings.   Oncology History  Breast cancer of upper-outer quadrant of right female breast (HCC)  07/24/2016 Mammogram   Rt Breast 2 masses at 11-12:30 Position spanning 8 cm; Additional mass at 2 o clock: 1.5 cm; LN with cort thickening 2.7 cm   07/27/2016 Initial Diagnosis   Rt Biopsy 11oclock and 2 o clock: Grade 3 IDC with LVI; LN biopsy: Positive IDC; 11oclock: Er 100%, PR 90%, Ki 67: 90%; Her 2 Neg Heterogeneous; LN: Er 100%, PR: 90%; Ki67: 60%; Her 2 Pos Ratio 2.37; 2oclock: Er 100%, PR 90%; Ki 67: 90%; Her 2 Positive 2.52   08/16/2016 - 12/01/2016 Neo-Adjuvant Chemotherapy   Neoadjuvant chemotherapy with TCH Perjeta (Taxotere d/ced after 2 cycles)   12/06/2016 Breast MRI   Residual right breast 2 o'clock enhancing mass which now measures 1.3 cm in greatest dimension.Residual non measurable scattered foci of heterogeneous enhancement throughout the superior and subareolar right breast, anterior and middle depth. Diminished right axillary lymphadenopathy   01/03/2017 Surgery   Right mastectomy: Multiple IDC with micropapillary features, LVI, grade 3, 1.5, 0.6, 0.5 cm, 12/20 lymph nodes positive for cancer, margins negative, ER 100%, PR 90%, HER-2  positive, Ki-67 60%,ypT1c ypN3 RCB class III; stage IIIB   02/12/2017 - 03/26/2017 Radiation Therapy   Adjuvant radiation therapy   04/06/2017 -  Anti-estrogen oral therapy   Anastrozole 1 mg by mouth daily, could not tolerate Neratinib; stopped 10/03/18 due to severe myalgias, switched to tamoxifen started 10/17/18     REVIEW OF SYSTEMS:   Constitutional: Denies fevers, chills or abnormal weight loss All other systems were reviewed with the patient and are negative. Observations/Objective:     Assessment Plan:  Breast cancer of upper-outer quadrant of right female breast (HCC) Rt Biopsy 11oclock and 2 o clock: Grade 3 IDC with LVI; LN biopsy: Positive IDC; 11oclock: Er 100%, PR 90%, Ki 67: 90%; Her 2 Neg Heterogeneous; LN: Er 100%, PR: 90%; Ki67: 60%; Her 2 Pos Ratio 2.37; 2oclock: Er 100%, PR 90%; Ki 67: 90%; Her 2 Positive 2.52 Breast MRI on 08/13/2016: 8 cm span of breast cancer with multiple enlarged axillary lymph nodes. CT CAP: 08/17/16: 4 mm right middle lobe pulmonary nodule unlikely to be metastatic disease Bone scan 08/17/2016: No evidence of metastatic disease   Echocardiogram performed on 05/17/2017 showed a left ventricular ejection fraction of 60-65%.   Treatment summary:  1. Neoadjuvant chemotherapy with TCH Perjeta 6 cycles given 08/16/16 to 12/01/16 followed by Herceptin Perjeta maintenance for 1 year  2. Followed by mastectomy with axillary node dissection 01/03/2017 3. Adjuvant radiation therapy completed 03/26/2017 4. Followed by Anti-estrogen therapy started 04/06/2017 5. Followed by Neratinib for 1 year started 09/07/2017 -------------------------------------------------------------------------------------------------------------------------------------------------------------------------------------------------------------------------------------- 01/04/2007 Right mastectomy: Multiple IDC with micropapillary features, LVI, grade 3, 1.5, 0.6, 0.5 cm,  12/20 lymph nodes  positive for cancer, margins negative, ER 100%, PR 90%, HER-2 positive, Ki-67 60%,ypT1c ypN3 RCB class III; stage IIIB   Adjuvant radiation therapy started 02/12/2017 completed 03/26/2017   Current treatment: Neratinib (started 09/01/17 discontinued 10/04/2017) and Anastrozole started 04/06/17 switched to tamoxifen 10/03/2018 switched to letrozole due to DVT December 2022   Letrozole toxicities: Sweet cravings Plan is keep her on it till Aug 2026  History of DVT and PE December 2022: I recommended discontinuation of tamoxifen and switching her back to letrozole.   Breast cancer surveillance: 1.  Left breast mammogram: 09/12/2022 benign breast density category B 2.  Breast exam: Benign 12/06/2022 3.  CT chest performed for lung nodule evaluation 07/25/2021: 4 mm lung nodule   Skin Rash: sent for Triamcinolone ointment Return to clinic in 1 year for follow-up   I discussed the assessment and treatment plan with the patient. The patient was provided an opportunity to ask questions and all were answered. The patient agreed with the plan and demonstrated an understanding of the instructions. The patient was advised to call back or seek an in-person evaluation if the symptoms worsen or if the condition fails to improve as anticipated.   I provided 12 minutes of non-face-to-face time during this encounter.  This includes time for charting and coordination of care   Cynthia Meek, MD  I Janan Ridge am acting as a scribe for Dr.Rula Keniston  I have reviewed the above documentation for accuracy and completeness, and I agree with the above.

## 2022-12-08 ENCOUNTER — Telehealth: Payer: Self-pay | Admitting: Hematology and Oncology

## 2022-12-08 NOTE — Telephone Encounter (Signed)
Scheduled appointment per 5/1 los. Patient is aware of the made appointments. 

## 2022-12-14 ENCOUNTER — Other Ambulatory Visit: Payer: Self-pay | Admitting: Family Medicine

## 2022-12-14 DIAGNOSIS — E039 Hypothyroidism, unspecified: Secondary | ICD-10-CM

## 2022-12-20 ENCOUNTER — Telehealth: Payer: Self-pay | Admitting: Family Medicine

## 2022-12-20 NOTE — Telephone Encounter (Signed)
Contacted Sande Brothers to schedule their annual wellness visit. Appointment made for 06/14/2023.  Thank you,  Judeth Cornfield,  AMB Clinical Support [facility] AWV Program Direct Dial ??[phone]

## 2023-01-18 ENCOUNTER — Other Ambulatory Visit: Payer: Self-pay | Admitting: Family Medicine

## 2023-01-18 DIAGNOSIS — E039 Hypothyroidism, unspecified: Secondary | ICD-10-CM

## 2023-01-30 ENCOUNTER — Ambulatory Visit (INDEPENDENT_AMBULATORY_CARE_PROVIDER_SITE_OTHER): Payer: Medicare HMO

## 2023-01-30 ENCOUNTER — Ambulatory Visit (INDEPENDENT_AMBULATORY_CARE_PROVIDER_SITE_OTHER): Payer: Medicare HMO | Admitting: Family

## 2023-01-30 ENCOUNTER — Encounter: Payer: Self-pay | Admitting: Family

## 2023-01-30 VITALS — BP 172/91 | HR 67 | Temp 97.6°F | Ht 67.0 in | Wt 176.4 lb

## 2023-01-30 DIAGNOSIS — M25562 Pain in left knee: Secondary | ICD-10-CM | POA: Diagnosis not present

## 2023-01-30 DIAGNOSIS — Z043 Encounter for examination and observation following other accident: Secondary | ICD-10-CM | POA: Diagnosis not present

## 2023-01-30 DIAGNOSIS — W19XXXA Unspecified fall, initial encounter: Secondary | ICD-10-CM

## 2023-01-30 DIAGNOSIS — S83522A Sprain of posterior cruciate ligament of left knee, initial encounter: Secondary | ICD-10-CM | POA: Diagnosis not present

## 2023-01-30 DIAGNOSIS — M25462 Effusion, left knee: Secondary | ICD-10-CM | POA: Diagnosis not present

## 2023-01-30 MED ORDER — BACLOFEN 10 MG PO TABS
10.0000 mg | ORAL_TABLET | Freq: Three times a day (TID) | ORAL | 0 refills | Status: DC
Start: 2023-01-30 — End: 2023-08-22

## 2023-01-30 MED ORDER — PREDNISONE 20 MG PO TABS
40.0000 mg | ORAL_TABLET | Freq: Every day | ORAL | 0 refills | Status: AC
Start: 2023-01-30 — End: 2023-02-04

## 2023-01-30 MED ORDER — DICLOFENAC SODIUM 75 MG PO TBEC: 75.0000 mg | DELAYED_RELEASE_TABLET | Freq: Two times a day (BID) | ORAL | 0 refills | Status: DC

## 2023-01-30 NOTE — Patient Instructions (Signed)
Posterior Cruciate Ligament Tear, Phase I Rehab Ask your health care provider which exercises are safe for you. Do exercises exactly as told by your provider and adjust them as directed. It's normal to feel mild stretching, pulling, tightness, or discomfort as you do these exercises. Stop right away if you feel sudden pain or your pain gets worse. Do not start these exercises until told by your provider. Stretching and range-of-motion exercises These exercises warm up your muscles and joints. They help improve the movement and flexibility of your knee. They also help to relieve pain, numbness, and tingling. Knee flexion, active  Lie on your back with both knees straight. If this hurts your back, bend your healthy knee so your foot is flat on the floor. Slowly slide your left / right heel back toward your butt until you feel a gentle stretch (active flexion) in the front of your knee or thigh. Hold this position for _________ seconds. Slowly slide your left / right heel back to the starting position. Repeat __________ times. Complete this exercise __________ times a day. Knee extension, prone Your provider may ask you to do this exercise while wearing a __________ ankle weight to enhance the stretch. Lie on your abdomen on a bed (prone position). Place your left / right knee just beyond the edge of the surface so that your knee is not on the bed. You can put a towel under the far end of your left / right thigh just above your kneecap for comfort. Relax your leg muscles and allow gravity to straighten your knee (extension). You should feel a stretch behind your left / right knee. Hold this position for _______ seconds. Repeat __________ times. Complete this exercise __________ times a day. Hamstring stretch, supine  Lie on your back (supine position). Loop a belt or towel over the ball of your left / right foot. The ball of your foot is on the walking surface, right under your toes. Straighten  your left / right knee and slowly pull on the belt to raise your leg until you feel a gentle stretch behind your knee (hamstring). Do not let your knee bend while you do this. Keep your other leg flat on the floor. Hold this position for __________ seconds. Repeat __________ times. Complete this exercise __________ times a day. Strengthening exercises These exercises build strength and endurance in your knee. Endurance is the ability to use your muscles for a long time, even after they get tired. Quadriceps, isometric This is an exercise in which you stretch the muscles in the front of your thigh (quadriceps) without moving your knee joint (isometric). Lie on your back with your left / right leg extended and your other knee bent. If told by your provider, put a rolled towel or a small pillow under your left / right knee. Slowly tense the muscles in the front of your left / right thigh. You should see your kneecap slide up toward your hip or see more dimpling just above your knee. This motion will push the back of your knee down toward the surface under it. For __________ seconds, hold the muscles as tight as you can without causing more pain. Relax the muscles slowly and fully after each repetition. Repeat __________ times. Complete this exercise __________ times a day. Straight leg raises This exercise is sometimes called a quadriceps and hip flexor exercise. Lie on your back with your left / right leg extended and your healthy knee bent. Tense the muscles in the front  of your left / right thigh (quadriceps). You should see your kneecap slide up or see more dimpling just above the knee. Tighten these muscles even more as you raise your leg 4-6 inches (10-15 cm) off the floor. Do not let your knee bend. Hold this position for __________ seconds. Keep the thigh muscles tense as you lower your leg. Relax your muscles slowly and fully after each repetition. Repeat __________ times. Complete this  exercise __________ times a day. This information is not intended to replace advice given to you by your health care provider. Make sure you discuss any questions you have with your health care provider. Document Revised: 07/26/2022 Document Reviewed: 07/26/2022 Elsevier Patient Education  2024 [employer].

## 2023-01-30 NOTE — Progress Notes (Signed)
Subjective:    Patient ID: Cynthia Chavez, female    DOB: [birth year], 67 y.o.   MRN: [id number]  Chief Complaint  Patient presents with   Knee Pain    Left knee pain after a fall on knee.  States when Cynthia Chavez fell her left knee twisted x 1 week ago    Pt presents to the office today with left knee pain after falling. Reports Cynthia Chavez was in her garden and fell and twisted her left knee. Reports aching pain of 7-10 out 10.  Knee Pain  The incident occurred more than 1 week ago. The injury mechanism was a fall. The pain is present in the left knee. The quality of the pain is described as aching. The pain is at a severity of 8/10. The pain is moderate. The pain has been Intermittent since onset. Associated symptoms include muscle weakness. Pertinent negatives include no loss of motion or loss of sensation. Cynthia Chavez reports no foreign bodies present. The symptoms are aggravated by movement and weight bearing. Cynthia Chavez has tried acetaminophen for the symptoms. The treatment provided mild relief.      Review of Systems  All other systems reviewed and are negative.      Objective:   Physical Exam Vitals reviewed.  Constitutional:      General: Cynthia Chavez is not in acute distress.    Appearance: Cynthia Chavez is well-developed.  HENT:     Head: Normocephalic and atraumatic.  Eyes:     Pupils: Pupils are equal, round, and reactive to light.  Neck:     Thyroid: No thyromegaly.  Cardiovascular:     Rate and Rhythm: Normal rate and regular rhythm.     Heart sounds: Normal heart sounds. No murmur heard. Pulmonary:     Effort: Pulmonary effort is normal. No respiratory distress.     Breath sounds: Normal breath sounds. No wheezing.  Abdominal:     General: Bowel sounds are normal. There is no distension.     Palpations: Abdomen is soft.     Tenderness: There is no abdominal tenderness.  Musculoskeletal:        General: Tenderness present.     Cervical back: Normal range of motion and neck supple.     Comments: Pain in  posterior knee with flexion and extension  Skin:    General: Skin is warm and dry.  Neurological:     Mental Status: Cynthia Chavez is alert and oriented to person, place, and time.     Cranial Nerves: No cranial nerve deficit.     Deep Tendon Reflexes: Reflexes are normal and symmetric.  Psychiatric:        Behavior: Behavior normal.        Thought Content: Thought content normal.        Judgment: Judgment normal.     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  States when Cynthia Chavez fell her left knee twisted x 1 week ago )   Diagnosis and orders addressed:  1. Fall, initial encounter - DG Knee 1-2 Views Left; Future  2. Acute pain of left knee - diclofenac (VOLTAREN) 75 MG EC tablet; Take 1  tablet (75 mg total) by mouth 2 (two) times daily.  Dispense: 30 tablet; Refill: 0 - baclofen (LIORESAL) 10 MG tablet; Take 1 tablet (10 mg total) by mouth 3 (three) times daily.  Dispense: 30 each; Refill: 0  3. Sprain of posterior cruciate ligament of left knee, initial encounter - diclofenac (VOLTAREN) 75 MG EC tablet; Take 1 tablet (75 mg total) by mouth 2 (two) times daily.  Dispense: 30 tablet; Refill: 0 - baclofen (LIORESAL) 10 MG tablet; Take 1 tablet (10 mg total) by mouth 3 (three) times daily.  Dispense: 30 each; Refill: 0 - predniSONE (DELTASONE) 20 MG tablet; Take 2 tablets (40 mg total) by mouth daily with breakfast for 5 days.  Dispense: 10 tablet; Refill: 0   ROM exercises  Diclofenac BID with food, no other NSAID's  Take Nexium while taking Diclofenac Prednisone  ROM exercises encouraged   Jannifer Rodney, FNP

## 2023-02-07 ENCOUNTER — Ambulatory Visit (INDEPENDENT_AMBULATORY_CARE_PROVIDER_SITE_OTHER): Payer: Medicare HMO | Admitting: Family Medicine

## 2023-02-07 ENCOUNTER — Encounter: Payer: Self-pay | Admitting: Family Medicine

## 2023-02-07 VITALS — BP 128/75 | HR 89 | Temp 97.8°F | Resp 96

## 2023-02-07 DIAGNOSIS — M25562 Pain in left knee: Secondary | ICD-10-CM | POA: Diagnosis not present

## 2023-02-07 MED ORDER — METHYLPREDNISOLONE ACETATE 40 MG/ML IJ SUSP
60.0000 mg | Freq: Once | INTRAMUSCULAR | Status: DC
Start: 2023-02-07 — End: 2023-02-07

## 2023-02-07 MED ORDER — METHYLPREDNISOLONE ACETATE 40 MG/ML IJ SUSP
60.0000 mg | Freq: Once | INTRAMUSCULAR | Status: AC
Start: 2023-02-07 — End: 2023-02-07
  Administered 2023-02-07: 60 mg via INTRA_ARTICULAR

## 2023-02-07 NOTE — Progress Notes (Signed)
Subjective:  Patient ID: Cynthia Chavez, female    DOB: 04/27/1956, 67 y.o.   MRN: [id number]  Patient Care Team: Raliegh Ip, DO as PCP - General (Family Medicine) Janalyn Harder, MD (Inactive) as Consulting Physician (Dermatology)   Chief Complaint:  Knee Pain (LEFT, WANTS INJECTION)   HPI: Cynthia Chavez is a 67 y.o. female presenting on 02/07/2023 for Knee Pain (LEFT, WANTS INJECTION)   Ongoing and worsening knee pain. Was seen last month and placed on oral medications. States this helped minimally but pain persists. No new injuries.   Knee Pain  The incident occurred more than 1 week ago. The incident occurred at home. The injury mechanism was a twisting injury. The pain is present in the left knee. The pain is moderate. The pain has been Constant since onset. Pertinent negatives include no inability to bear weight, loss of motion, loss of sensation, muscle weakness, numbness or tingling. She reports no foreign bodies present. The symptoms are aggravated by movement, palpation and weight bearing. She has tried NSAIDs, immobilization, ice, elevation and acetaminophen for the symptoms. The treatment provided mild relief.       Relevant past medical, surgical, family, and social history reviewed and updated as indicated.  Allergies and medications reviewed and updated. Data reviewed: Chart in Epic.   Past Medical History:  Diagnosis Date   Anxiety    Breast cancer (HCC)    Cancer (HCC)    breast cancer   Depression    Edema    bilateral feet and leg swelling   GERD (gastroesophageal reflux disease)    History of hiatal hernia    History of radiation therapy 02/12/17- 03/26/17   Chest wall and regional nodes, right- 50 Gy in 25 fractions, Chest Wall, boost, right- 10 Gy in 5 fractions.    Hypothyroidism    Malaise    Personal history of chemotherapy    Personal history of radiation therapy    Port catheter in place 09/08/2016   Sprain of shoulder, right 11/23/2020    Thyroid disease    Varicose veins     Past Surgical History:  Procedure Laterality Date   ABDOMINAL HYSTERECTOMY     AXILLARY LYMPH NODE BIOPSY Right 01/03/2017   Procedure: RIGHT AXILLARY LYMPH NODE SEED GUIDED EXCISIONAL BIOPSY;  Surgeon: Emelia Loron, MD;  Location: [facility] OR;  Service: General;  Laterality: Right;   BREAST SURGERY     CHOLECYSTECTOMY     COLONOSCOPY     ESOPHAGEAL DILATION     MASTECTOMY Right    MASTECTOMY W/ SENTINEL NODE BIOPSY Right 01/03/2017   axillary   MASTECTOMY W/ SENTINEL NODE BIOPSY Right 01/03/2017   Procedure: RIGHT TOTAL MASTECTOMY WITH RIGHT AXILLARY SENTINEL LYMPH NODE BIOPSY;  Surgeon: Emelia Loron, MD;  Location: [facility] OR;  Service: General;  Laterality: Right;   PORTACATH PLACEMENT Left 08/17/2016   Procedure: INSERTION PORT-A-CATH WITH [place];  Surgeon: Emelia Loron, MD;  Location: [facility];  Service: General;  Laterality: Left;    Social History   Socioeconomic History   Marital status: Married    Spouse name: Chrissie Noa   Number of children: 3   Years of education: 11   Highest education level: 11th grade  Occupational History   Occupation: Conservation officer, nature    Comment: [employer] part time  Tobacco Use   Smoking status: Every Day    Packs/day: 0.50    Years: 25.00    Additional pack years: 0.00  Total pack years: 12.50    Types: Cigarettes    Start date: 04/19/1993   Smokeless tobacco: Never   Tobacco comments:    she stopped smoking for a long time, but started back.   Vaping Use   Vaping Use: Never used  Substance and Sexual Activity   Alcohol use: No   Drug use: No   Sexual activity: Not on file  Other Topics Concern   Not on file  Social History Narrative   One level living at home with her husband.   Children live nearby - visits with family once or twice per week.   Active still working part time, working outdoors, gardening, Catering manager.   Social Determinants of Health   Financial Resource Strain: Low  Risk  (06/12/2022)   Overall Financial Resource Strain (CARDIA)    Difficulty of Paying Living Expenses: Not hard at all  Food Insecurity: No Food Insecurity (06/12/2022)   Hunger Vital Sign    Worried About Running Out of Food in the Last Year: Never true    Ran Out of Food in the Last Year: Never true  Transportation Needs: No Transportation Needs (06/12/2022)   PRAPARE - Administrator, Civil Service (Medical): No    Lack of Transportation (Non-Medical): No  Physical Activity: Sufficiently Active (06/12/2022)   Exercise Vital Sign    Days of Exercise per Week: 5 days    Minutes of Exercise per Session: 30 min  Stress: No Stress Concern Present (06/12/2022)   [employer] of Occupational Health - Occupational Stress Questionnaire    Feeling of Stress : Not at all  Social Connections: Socially Integrated (06/12/2022)   Social Connection and Isolation Panel [NHANES]    Frequency of Communication with Friends and Family: More than three times a week    Frequency of Social Gatherings with Friends and Family: More than three times a week    Attends Religious Services: More than 4 times per year    Active Member of [employer] or Organizations: Yes    Attends Engineer, structural: More than 4 times per year    Marital Status: Married  Catering manager Violence: Not At Risk (06/12/2022)   Humiliation, Afraid, Rape, and Kick questionnaire    Fear of Current or Ex-Partner: No    Emotionally Abused: No    Physically Abused: No    Sexually Abused: No    Outpatient Encounter Medications as of 02/07/2023  Medication Sig   acetaminophen (TYLENOL) 500 MG tablet Take 1 tablet (500 mg total) by mouth every 6 (six) hours as needed.   baclofen (LIORESAL) 10 MG tablet Take 1 tablet (10 mg total) by mouth 3 (three) times daily.   betamethasone dipropionate 0.05 % cream Apply topically 2 (two) times daily. As needed for psoriasis   diclofenac (VOLTAREN) 75 MG EC tablet Take 1 tablet (75  mg total) by mouth 2 (two) times daily.   docusate sodium (COLACE) 100 MG capsule Take 1 capsule (100 mg total) by mouth 2 (two) times daily.   esomeprazole (NEXIUM) 40 MG capsule Take 1 capsule (40 mg total) by mouth daily at 12 noon.   fluticasone (FLONASE) 50 MCG/ACT nasal spray Place 2 sprays into both nostrils daily.   guaiFENesin (MUCINEX) 600 MG 12 hr tablet Take 1 tablet (600 mg total) by mouth 2 (two) times daily.   letrozole (FEMARA) 2.5 MG tablet Take 1 tablet by mouth once daily   levothyroxine (SYNTHROID) 150 MCG tablet TAKE 1 TABLET  BY MOUTH IN THE MORNING BEFORE BREAKFAST EXCEPT TAKE 1/2 TABLET BY MOUTH ON SUNDAYS   [EXPIRED] methylPREDNISolone acetate (DEPO-MEDROL) injection 60 mg    [DISCONTINUED] methylPREDNISolone acetate (DEPO-MEDROL) injection 60 mg    No facility-administered encounter medications on file as of 02/07/2023.    Allergies  Allergen Reactions   Beef-Derived Products Hives, Shortness Of Breath, Swelling and Rash   Ivp Dye [Iodinated Contrast Media] Other (See Comments)    Chest pain   Other Shortness Of Breath, Swelling and Other (See Comments)    Versed or Fentanyl when used for endoscopy caused throat and tongue swelling   Augmentin [Amoxicillin-Pot Clavulanate]    Dilaudid [Hydromorphone Hcl]     PT had CP and felt shaky. Didn't like the feeling, but sx lessened w/in 10 min of IV administration   Codeine Other (See Comments)    spasms    Review of Systems  Constitutional:  Negative for activity change, appetite change, chills, diaphoresis, fatigue, fever and unexpected weight change.  HENT: Negative.    Eyes: Negative.  Negative for photophobia and visual disturbance.  Respiratory:  Negative for cough, chest tightness and shortness of breath.   Cardiovascular:  Negative for chest pain, palpitations and leg swelling.  Gastrointestinal:  Negative for abdominal pain, blood in stool, constipation, diarrhea, nausea and vomiting.  Endocrine: Negative.    Genitourinary:  Negative for decreased urine volume, difficulty urinating, dysuria, frequency and urgency.  Musculoskeletal:  Positive for arthralgias and joint swelling. Negative for myalgias.  Skin: Negative.   Allergic/Immunologic: Negative.   Neurological:  Negative for dizziness, tingling, tremors, seizures, syncope, facial asymmetry, speech difficulty, weakness, light-headedness, numbness and headaches.  Hematological: Negative.   Psychiatric/Behavioral:  Negative for confusion, hallucinations, sleep disturbance and suicidal ideas.   All other systems reviewed and are negative.       Objective:  BP 128/75   Pulse 89   Temp 97.8 F (36.6 C)   Resp (!) 96   SpO2 96%    Wt Readings from Last 3 Encounters:  01/30/23 176 lb 6.4 oz (80 kg)  06/12/22 183 lb (83 kg)  04/18/22 184 lb 9.6 oz (83.7 kg)    Physical Exam Vitals and nursing note reviewed.  Constitutional:      General: She is not in acute distress.    Appearance: Normal appearance. She is well-developed and well-groomed. She is obese. She is not ill-appearing, toxic-appearing or diaphoretic.  HENT:     Head: Normocephalic and atraumatic.     Jaw: There is normal jaw occlusion.     Right Ear: Hearing normal.     Left Ear: Hearing normal.     Nose: Nose normal.     Mouth/Throat:     Lips: Pink.     Mouth: Mucous membranes are moist.     Pharynx: Oropharynx is clear. Uvula midline.  Eyes:     General: Lids are normal.     Extraocular Movements: Extraocular movements intact.     Conjunctiva/sclera: Conjunctivae normal.     Pupils: Pupils are equal, round, and reactive to light.  Neck:     Thyroid: No thyroid mass, thyromegaly or thyroid tenderness.     Vascular: No carotid bruit or JVD.     Trachea: Trachea and phonation normal.  Cardiovascular:     Rate and Rhythm: Normal rate and regular rhythm.     Chest Wall: PMI is not displaced.     Pulses: Normal pulses.     Heart sounds: Normal heart sounds.  No  murmur heard.    No friction rub. No gallop.  Pulmonary:     Effort: Pulmonary effort is normal. No respiratory distress.     Breath sounds: Normal breath sounds. No wheezing.  Abdominal:     General: Bowel sounds are normal. There is no distension or abdominal bruit.     Palpations: Abdomen is soft. There is no hepatomegaly or splenomegaly.     Tenderness: There is no abdominal tenderness. There is no right CVA tenderness or left CVA tenderness.     Hernia: No hernia is present.  Musculoskeletal:     Cervical back: Normal range of motion and neck supple.     Right upper leg: Normal.     Left upper leg: Normal.     Left knee: Swelling and effusion present. No deformity, erythema, ecchymosis, lacerations, bony tenderness or crepitus. Decreased range of motion. Tenderness present. No medial joint line, lateral joint line, MCL, LCL, ACL, PCL or patellar tendon tenderness. No LCL laxity, MCL laxity, ACL laxity or PCL laxity.Normal alignment, normal meniscus and normal patellar mobility. Normal pulse.     Instability Tests: Anterior drawer test negative. Posterior drawer test negative. Anterior Lachman test negative. Medial McMurray test negative and lateral McMurray test negative.     Right lower leg: Normal. No edema.     Left lower leg: Normal. No edema.  Lymphadenopathy:     Cervical: No cervical adenopathy.  Skin:    General: Skin is warm and dry.     Capillary Refill: Capillary refill takes less than 2 seconds.     Coloration: Skin is not cyanotic, jaundiced or pale.     Findings: No rash.  Neurological:     General: No focal deficit present.     Mental Status: She is alert and oriented to person, place, and time.     Sensory: Sensation is intact.     Motor: Motor function is intact.     Coordination: Coordination is intact.     Gait: Gait abnormal (antalgic, using cane).     Deep Tendon Reflexes: Reflexes are normal and symmetric.  Psychiatric:        Attention and Perception:  Attention and perception normal.        Mood and Affect: Mood and affect normal.        Speech: Speech normal.        Behavior: Behavior normal. Behavior is cooperative.        Thought Content: Thought content normal.        Cognition and Memory: Cognition and memory normal.        Judgment: Judgment normal.     Results for orders placed or performed in visit on 04/18/22  Alpha-Gal Panel  Result Value Ref Range   Class Description Allergens Comment    IgE (Immunoglobulin E), Serum 370 6 - 495 IU/mL   [id number] Alpha-Gal 83.40 (A) Class V kU/L   Beef IgE 42.10 (A) Class V kU/L   Pork IgE 30.90 (A) Class V kU/L   Allergen Lamb IgE 17.10 (A) Class IV kU/L  CMP14+EGFR  Result Value Ref Range   Glucose 94 70 - 99 mg/dL   BUN 15 8 - 27 mg/dL   Creatinine, Ser 1.61 0.57 - 1.00 mg/dL   eGFR 84 >09 UE/AVW/0.98   BUN/Creatinine Ratio 19 12 - 28   Sodium 140 134 - 144 mmol/L   Potassium 4.1 3.5 - 5.2 mmol/L   Chloride 102 96 - 106 mmol/L  CO2 24 20 - 29 mmol/L   Calcium 9.1 8.7 - 10.3 mg/dL   Total Protein 6.4 6.0 - 8.5 g/dL   Albumin 4.0 3.9 - 4.9 g/dL   Globulin, Total 2.4 1.5 - 4.5 g/dL   Albumin/Globulin Ratio 1.7 1.2 - 2.2   Bilirubin Total 0.4 0.0 - 1.2 mg/dL   Alkaline Phosphatase 110 44 - 121 IU/L   AST 12 0 - 40 IU/L   ALT 10 0 - 32 IU/L  CBC  Result Value Ref Range   WBC 6.4 3.4 - 10.8 x10E3/uL   RBC 4.23 3.77 - 5.28 x10E6/uL   Hemoglobin 12.7 11.1 - 15.9 g/dL   Hematocrit 16.1 09.6 - 46.6 %   MCV 94 79 - 97 fL   MCH 30.0 26.6 - 33.0 pg   MCHC 32.0 31.5 - 35.7 g/dL   RDW 04.5 40.9 - 81.1 %   Platelets 202 150 - 450 x10E3/uL  TSH  Result Value Ref Range   TSH 0.132 (L) 0.450 - 4.500 uIU/mL  T4, Free  Result Value Ref Range   Free T4 1.45 0.82 - 1.77 ng/dL  VITAMIN D 25 Hydroxy (Vit-D Deficiency, Fractures)  Result Value Ref Range   Vit D, 25-Hydroxy 21.8 (L) 30.0 - 100.0 ng/mL     Joint Injection/Arthrocentesis  Date/Time: 02/07/2023 1:50 PM  Performed by:  Sonny Masters, FNP Authorized by: Sonny Masters, FNP  Indications: joint swelling and pain  Body area: knee Joint: left knee Local anesthesia used: yes  Anesthesia: Local anesthesia used: yes Local Anesthetic: co-phenylcaine spray  Sedation: Patient sedated: no  Preparation: Patient was prepped and draped in the usual sterile fashion. Needle size: 22 G Ultrasound guidance: no Approach: lateral Aspirate: serous and yellow Aspirate amount: 15 mL Methylprednisolone amount: 60 mg Lidocaine 2% amount: 3.5 mL Patient tolerance: patient tolerated the procedure well with no immediate complications      Pertinent labs & imaging results that were available during my care of the patient were reviewed by me and considered in my medical decision making.  Assessment & Plan:  Vesper was seen today for knee pain.  Diagnoses and all orders for this visit:  Acute pain of left knee Ongoing pain despite oral antiinflammatories. Joint injection in office. Tolerated well. Symptomatic care discussed in detail. Aware to report new, worsening, or persistent symptoms. Will refer to ortho if persistent or worsening.  -     methylPREDNISolone acetate (DEPO-MEDROL) injection 60 mg -     Joint Injection/Arthrocentesis     Continue all other maintenance medications.  Follow up plan: Return if symptoms worsen or fail to improve.   Continue healthy lifestyle choices, including diet (rich in fruits, vegetables, and lean proteins, and low in salt and simple carbohydrates) and exercise (at least 30 minutes of moderate physical activity daily).  Educational handout given for knee injection   The above assessment and management plan was discussed with the patient. The patient verbalized understanding of and has agreed to the management plan. Patient is aware to call the clinic if they develop any new symptoms or if symptoms persist or worsen. Patient is aware when to return to the clinic for a follow-up  visit. Patient educated on when it is appropriate to go to the emergency department.   Kari Baars, FNP-C [facility] [phone]

## 2023-02-14 ENCOUNTER — Ambulatory Visit: Payer: Medicare HMO | Admitting: Orthopaedic Surgery

## 2023-02-14 ENCOUNTER — Encounter: Payer: Self-pay | Admitting: Orthopaedic Surgery

## 2023-02-14 VITALS — BP 150/87 | HR 84 | Ht 67.0 in | Wt 186.0 lb

## 2023-02-14 DIAGNOSIS — G8929 Other chronic pain: Secondary | ICD-10-CM | POA: Diagnosis not present

## 2023-02-14 DIAGNOSIS — M25562 Pain in left knee: Secondary | ICD-10-CM

## 2023-02-14 NOTE — Progress Notes (Signed)
Subjective:    Patient ID: Cynthia Chavez, female    DOB: [birth year], 66 y.o.   MRN: [id number]  HPI She has had pain in the left knee for the last three to four weeks.  She fell while in the garden at her home.  She hurt her left knee. She was seen at [facility] twice since then.  She has been on diclofenac. She has had injection of the knee.  It still swells, gives way. She is using a cane now.  She is not getting any better. She had X-rays.  I have reviewed the notes and the X-rays.  I have independently reviewed and interpreted x-rays of this patient done at another site by another physician or qualified health professional.    Review of Systems  Constitutional:  Positive for activity change.  Musculoskeletal:  Positive for arthralgias, gait problem and joint swelling.  All other systems reviewed and are negative. For Review of Systems, all other systems reviewed and are negative.  The following is a summary of the past history medically, past history surgically, known current medicines, social history and family history.  This information is gathered electronically by the computer from prior information and documentation.  I review this each visit and have found including this information at this point in the chart is beneficial and informative.   Past Medical History:  Diagnosis Date   Anxiety    Breast cancer (HCC)    Cancer (HCC)    breast cancer   Depression    Edema    bilateral feet and leg swelling   GERD (gastroesophageal reflux disease)    History of hiatal hernia    History of radiation therapy 02/12/17- 03/26/17   Chest wall and regional nodes, right- 50 Gy in 25 fractions, Chest Wall, boost, right- 10 Gy in 5 fractions.    Hypothyroidism    Malaise    Personal history of chemotherapy    Personal history of radiation therapy    Port catheter in place 09/08/2016   Sprain of shoulder, right 11/23/2020   Thyroid disease    Varicose veins     Past Surgical  History:  Procedure Laterality Date   ABDOMINAL HYSTERECTOMY     AXILLARY LYMPH NODE BIOPSY Right 01/03/2017   Procedure: RIGHT AXILLARY LYMPH NODE SEED GUIDED EXCISIONAL BIOPSY;  Surgeon: Emelia Loron, MD;  Location: [facility] OR;  Service: General;  Laterality: Right;   BREAST SURGERY     CHOLECYSTECTOMY     COLONOSCOPY     ESOPHAGEAL DILATION     MASTECTOMY Right    MASTECTOMY W/ SENTINEL NODE BIOPSY Right 01/03/2017   axillary   MASTECTOMY W/ SENTINEL NODE BIOPSY Right 01/03/2017   Procedure: RIGHT TOTAL MASTECTOMY WITH RIGHT AXILLARY SENTINEL LYMPH NODE BIOPSY;  Surgeon: Emelia Loron, MD;  Location: [facility] OR;  Service: General;  Laterality: Right;   PORTACATH PLACEMENT Left 08/17/2016   Procedure: INSERTION PORT-A-CATH WITH [place];  Surgeon: Emelia Loron, MD;  Location: [facility];  Service: General;  Laterality: Left;    Current Outpatient Medications on File Prior to Visit  Medication Sig Dispense Refill   acetaminophen (TYLENOL) 500 MG tablet Take 1 tablet (500 mg total) by mouth every 6 (six) hours as needed. 30 tablet 0   baclofen (LIORESAL) 10 MG tablet Take 1 tablet (10 mg total) by mouth 3 (three) times daily. 30 each 0   betamethasone dipropionate 0.05 % cream Apply topically 2 (two) times daily. As needed for  psoriasis 45 g 1   diclofenac (VOLTAREN) 75 MG EC tablet Take 1 tablet (75 mg total) by mouth 2 (two) times daily. 30 tablet 0   docusate sodium (COLACE) 100 MG capsule Take 1 capsule (100 mg total) by mouth 2 (two) times daily. 10 capsule 0   esomeprazole (NEXIUM) 40 MG capsule Take 1 capsule (40 mg total) by mouth daily at 12 noon. 90 capsule 3   fluticasone (FLONASE) 50 MCG/ACT nasal spray Place 2 sprays into both nostrils daily. 16 g 6   guaiFENesin (MUCINEX) 600 MG 12 hr tablet Take 1 tablet (600 mg total) by mouth 2 (two) times daily. 30 tablet 0   letrozole (FEMARA) 2.5 MG tablet Take 1 tablet by mouth once daily 90 tablet 0   levothyroxine  (SYNTHROID) 150 MCG tablet TAKE 1 TABLET BY MOUTH IN THE MORNING BEFORE BREAKFAST EXCEPT TAKE 1/2 TABLET BY MOUTH ON SUNDAYS 90 tablet 0   No current facility-administered medications on file prior to visit.    Social History   Socioeconomic History   Marital status: Married    Spouse name: Chrissie Noa   Number of children: 3   Years of education: 11   Highest education level: 11th grade  Occupational History   Occupation: Conservation officer, nature    Comment: [employer] part time  Tobacco Use   Smoking status: Every Day    Packs/day: 0.50    Years: 25.00    Additional pack years: 0.00    Total pack years: 12.50    Types: Cigarettes    Start date: 04/19/1993   Smokeless tobacco: Never   Tobacco comments:    she stopped smoking for a long time, but started back.   Vaping Use   Vaping Use: Never used  Substance and Sexual Activity   Alcohol use: No   Drug use: No   Sexual activity: Not on file  Other Topics Concern   Not on file  Social History Narrative   One level living at home with her husband.   Children live nearby - visits with family once or twice per week.   Active still working part time, working outdoors, gardening, Catering manager.   Social Determinants of Health   Financial Resource Strain: Low Risk  (06/12/2022)   Overall Financial Resource Strain (CARDIA)    Difficulty of Paying Living Expenses: Not hard at all  Food Insecurity: No Food Insecurity (06/12/2022)   Hunger Vital Sign    Worried About Running Out of Food in the Last Year: Never true    Ran Out of Food in the Last Year: Never true  Transportation Needs: No Transportation Needs (06/12/2022)   PRAPARE - Administrator, Civil Service (Medical): No    Lack of Transportation (Non-Medical): No  Physical Activity: Sufficiently Active (06/12/2022)   Exercise Vital Sign    Days of Exercise per Week: 5 days    Minutes of Exercise per Session: 30 min  Stress: No Stress Concern Present (06/12/2022)   [employer]  of Occupational Health - Occupational Stress Questionnaire    Feeling of Stress : Not at all  Social Connections: Socially Integrated (06/12/2022)   Social Connection and Isolation Panel [NHANES]    Frequency of Communication with Friends and Family: More than three times a week    Frequency of Social Gatherings with Friends and Family: More than three times a week    Attends Religious Services: More than 4 times per year    Active Member of Clubs or  Organizations: Yes    Attends Engineer, structural: More than 4 times per year    Marital Status: Married  Catering manager Violence: Not At Risk (06/12/2022)   Humiliation, Afraid, Rape, and Kick questionnaire    Fear of Current or Ex-Partner: No    Emotionally Abused: No    Physically Abused: No    Sexually Abused: No    Family History  Problem Relation Age of Onset   COPD Father    Cancer Father     BP (!) 150/87   Pulse 84   Ht 5\' 7"  (1.702 m)   Wt 186 lb (84.4 kg)   BMI 29.13 kg/m   Body mass index is 29.13 kg/m.      Objective:   Physical Exam Vitals and nursing note reviewed. Exam conducted with a chaperone present.  Constitutional:      Appearance: She is well-developed.  HENT:     Head: Normocephalic and atraumatic.  Eyes:     Conjunctiva/sclera: Conjunctivae normal.     Pupils: Pupils are equal, round, and reactive to light.  Cardiovascular:     Rate and Rhythm: Normal rate and regular rhythm.  Pulmonary:     Effort: Pulmonary effort is normal.  Abdominal:     Palpations: Abdomen is soft.  Musculoskeletal:     Cervical back: Normal range of motion and neck supple.       Legs:  Skin:    General: Skin is warm and dry.  Neurological:     Mental Status: She is alert and oriented to person, place, and time.     Cranial Nerves: No cranial nerve deficit.     Motor: No abnormal muscle tone.     Coordination: Coordination normal.     Deep Tendon Reflexes: Reflexes are normal and symmetric. Reflexes  normal.  Psychiatric:        Behavior: Behavior normal.        Thought Content: Thought content normal.        Judgment: Judgment normal.           Assessment & Plan:   Encounter Diagnosis  Name Primary?   Chronic pain of left knee Yes   I will get MRI of the left knee.  I feel she has meniscus tear.  Return in three weeks or earlier if she can get the MRI earlier.  Call if any problem.  Precautions discussed.  Electronically Signed Darreld Mclean, MD 7/10/202411:04 AM

## 2023-02-14 NOTE — Patient Instructions (Signed)
Call central scheduling [phone] make appointment  Call Dr Hilda Lias office let us know when the MRI is scheduled

## 2023-02-22 ENCOUNTER — Encounter: Payer: Self-pay | Admitting: Orthopaedic Surgery

## 2023-02-22 ENCOUNTER — Other Ambulatory Visit: Payer: Self-pay | Admitting: Hematology and Oncology

## 2023-02-22 DIAGNOSIS — C50411 Malignant neoplasm of upper-outer quadrant of right female breast: Secondary | ICD-10-CM

## 2023-02-25 ENCOUNTER — Ambulatory Visit
Admission: RE | Admit: 2023-02-25 | Discharge: 2023-02-25 | Disposition: A | Discharge status: Home / Self Care | Payer: Medicare HMO | Source: Ambulatory Visit | Attending: Orthopaedic Surgery | Admitting: Orthopaedic Surgery

## 2023-02-25 DIAGNOSIS — M1712 Unilateral primary osteoarthritis, left knee: Secondary | ICD-10-CM | POA: Diagnosis not present

## 2023-02-25 DIAGNOSIS — M23322 Other meniscus derangements, posterior horn of medial meniscus, left knee: Secondary | ICD-10-CM | POA: Diagnosis not present

## 2023-02-25 DIAGNOSIS — G8929 Other chronic pain: Secondary | ICD-10-CM

## 2023-03-07 ENCOUNTER — Encounter: Payer: Self-pay | Admitting: Orthopaedic Surgery

## 2023-03-07 ENCOUNTER — Ambulatory Visit: Payer: Medicare HMO | Admitting: Orthopaedic Surgery

## 2023-03-07 VITALS — BP 159/85 | HR 81 | Ht 67.0 in | Wt 186.0 lb

## 2023-03-07 DIAGNOSIS — S83242D Other tear of medial meniscus, current injury, left knee, subsequent encounter: Secondary | ICD-10-CM | POA: Diagnosis not present

## 2023-03-07 NOTE — Progress Notes (Signed)
My knee is better.  She had MRI of the left knee showing: IMPRESSION: 1. Radial tear involving the root of the posterior horn of the medial meniscus with mild peripheral extrusion of the meniscus from the joint space. 2. Prominent marrow edema throughout the medial tibial plateau with probable underlying subchondral insufficiency fracture. Possible additional acute osseous injury anteriorly in the lateral tibial plateau. 3. Moderate patellofemoral and medial compartment degenerative changes. 4. Intact lateral meniscus, cruciate and collateral ligaments.  I have explained the findings to her.  I have recommended arthroscopy.  She says the knee is better and she would not like to have surgery now.  I have independently reviewed the MRI.    Left knee has effusion, crepitus, positive medial McMurray, ROM 0 to 110, good gait, NV intact.  Encounter Diagnosis  Name Primary?   Other tear of medial meniscus, current injury, left knee, subsequent encounter Yes   I will see her prn at her request.  Call if any problem.  Precautions discussed.  Electronically Signed Darreld Mclean, MD 7/31/20249:35 AM

## 2023-03-23 ENCOUNTER — Ambulatory Visit (HOSPITAL_COMMUNITY)
Admission: RE | Admit: 2023-03-23 | Discharge: 2023-03-23 | Disposition: A | Discharge status: Home / Self Care | Payer: Medicare HMO | Source: Ambulatory Visit | Attending: Family Medicine | Admitting: Family Medicine

## 2023-03-23 ENCOUNTER — Ambulatory Visit (INDEPENDENT_AMBULATORY_CARE_PROVIDER_SITE_OTHER): Payer: Medicare HMO | Admitting: Family Medicine

## 2023-03-23 ENCOUNTER — Encounter: Payer: Self-pay | Admitting: Family Medicine

## 2023-03-23 ENCOUNTER — Telehealth: Payer: Self-pay | Admitting: Family Medicine

## 2023-03-23 VITALS — BP 136/82 | HR 70 | Wt 181.0 lb

## 2023-03-23 DIAGNOSIS — M7989 Other specified soft tissue disorders: Secondary | ICD-10-CM | POA: Diagnosis not present

## 2023-03-23 MED ORDER — DOXYCYCLINE HYCLATE 100 MG PO TABS
100.0000 mg | ORAL_TABLET | Freq: Two times a day (BID) | ORAL | 0 refills | Status: DC
Start: 2023-03-23 — End: 2023-03-30

## 2023-03-23 NOTE — Telephone Encounter (Signed)
Pt made aware and understood. Appt made for 8/23 at 9:50am  Pt states that [facility] did not have any compression stockings.  Pt would like to send Rx to [facility].  Order printed and faxed to [phone]

## 2023-03-23 NOTE — Progress Notes (Signed)
Negative for DVT, see patient call response

## 2023-03-23 NOTE — Progress Notes (Signed)
Acute Office Visit  Subjective:  Patient ID: Cynthia Chavez, female    DOB: [birth year], 67 y.o.   MRN: [id number]  Chief Complaint  Patient presents with   Joint Swelling    Left ankle   HPI Patient is in today for leg swelling in her left lower leg and ankle. States that she has been putting oil and a sock on it which has helped with the pain. States that it started 3-4 days ago. Has tried Epsom soaks and ice, did not help as much as the oil. She does not wear compression stockings. She has a compression sleeve on her left knee due from ortho. Elevates her ankle slightly with a step stool, has not tried elevating at heart level. Reports history of DVT and PE two years ago. She is not on anticoagulation at this time. She has a history of breast cancer, 5 years in remission.  Denies chest pain and shortness of breath. Denies fever and fatigue.   ROS As per HPI   Objective:  BP 136/82   Pulse 70   Wt 181 lb (82.1 kg)   SpO2 97%   BMI 28.35 kg/m   Physical Exam Constitutional:      General: She is awake. She is not in acute distress.    Appearance: Normal appearance. She is well-developed, well-groomed and overweight. She is not ill-appearing, toxic-appearing or diaphoretic.     Interventions: She is not intubated. HENT:     Head:     Comments: Hoarse voice  Cardiovascular:     Rate and Rhythm: Normal rate.     Pulses: Normal pulses.          Radial pulses are 2+ on the right side and 2+ on the left side.       Posterior tibial pulses are 2+ on the right side and 2+ on the left side.     Heart sounds: Normal heart sounds. No murmur heard.    No gallop.  Pulmonary:     Effort: Pulmonary effort is normal. No tachypnea, bradypnea, accessory muscle usage, prolonged expiration, respiratory distress or retractions. She is not intubated.     Breath sounds: Normal breath sounds. No stridor, decreased air movement or transmitted upper airway sounds. No decreased breath sounds,  wheezing, rhonchi or rales.  Musculoskeletal:     Cervical back: Full passive range of motion without pain and neck supple.     Right lower leg: No edema.     Left lower leg: Tenderness present. No deformity, lacerations or bony tenderness. 3+ Edema present.     Left ankle: Swelling and ecchymosis present. Tenderness present over the lateral malleolus.     Left Achilles Tendon: No tenderness.     Comments: Skin discoloration with erythema and varicose veins present, warm to touch Bilateral calf circumference 14.5cm  Skin:    General: Skin is warm.     Capillary Refill: Capillary refill takes less than 2 seconds.  Neurological:     General: No focal deficit present.     Mental Status: She is alert, oriented to person, place, and time and easily aroused. Mental status is at baseline.     GCS: GCS eye subscore is 4. GCS verbal subscore is 5. GCS motor subscore is 6.     Motor: No weakness.  Psychiatric:        Attention and Perception: Attention and perception normal.        Mood and Affect: Mood and affect normal.  Speech: Speech normal.        Behavior: Behavior normal. Behavior is cooperative.        Thought Content: Thought content normal. Thought content does not include homicidal or suicidal ideation. Thought content does not include homicidal or suicidal plan.        Cognition and Memory: Cognition and memory normal.        Judgment: Judgment normal.       02/07/2023    1:53 PM 01/30/2023    8:35 AM 06/12/2022   10:37 AM  Depression screen PHQ 2/9  Decreased Interest 0 0 0  Down, Depressed, Hopeless 0 0 0  PHQ - 2 Score 0 0 0  Altered sleeping  0 0  Tired, decreased energy  0 0  Change in appetite  0 0  Feeling bad or failure about yourself   0 0  Trouble concentrating  0 0  Moving slowly or fidgety/restless  0 0  Suicidal thoughts  0 0  PHQ-9 Score  0 0  Difficult doing work/chores  Not difficult at all Not difficult at all      01/30/2023    8:35 AM 04/18/2022    11:28 AM 02/17/2022   11:16 AM 08/10/2021    9:11 AM  GAD 7 : Generalized Anxiety Score  Nervous, Anxious, on Edge 0 0 0 0  Control/stop worrying 0 0 0 0  Worry too much - different things 0 0 0 0  Trouble relaxing 0 0 0 0  Restless 0 0 0 0  Easily annoyed or irritable 0 0 0 0  Afraid - awful might happen 0 0 0 0  Total GAD 7 Score 0 0 0 0  Anxiety Difficulty Not difficult at all Not difficult at all Not difficult at all Not difficult at all    Wells' Criteria for DVT from StatOfficial.[email or web address].za  on 03/23/2023 ** All calculations should be rechecked by clinician prior to use **  RESULT SUMMARY: 3 points High risk group for DVT. "Likely" according to [facility]' DVT studies.   INPUTS: Active cancer --> 0 = No Bedridden recently >3 days or major surgery within 12 weeks --> 0 = No Calf swelling >3 cm compared to the other leg --> 0 = No Collateral (nonvaricose) superficial veins present --> 1 = Yes Entire leg swollen --> 0 = No Localized tenderness along the deep venous system --> 0 = No Pitting edema, confined to symptomatic leg --> 1 = Yes Paralysis, paresis, or recent plaster immobilization of the lower extremity --> 0 = No Previously documented DVT --> 1 = Yes Alternative diagnosis to DVT as likely or more likely --> 0 = No  Assessment & Plan:  1. Swelling of left lower extremity Reviewed Well's Criteria for DVT. Patient is at elevated risk. Due to several risk factors, stat US of leg ordered while patient is in office. Patient scheduled and instructed to present to [facility] at this time. Will await results of imaging to determine next steps.   - Compression stockings - US Venous Img Lower Unilateral Left (DVT); Future  The above assessment and management plan was discussed with the patient. The patient verbalized understanding of and has agreed to the management plan using shared-decision making. Patient is aware to call the clinic if they develop any new symptoms or if symptoms fail to improve or  worsen. Patient is aware when to return to the clinic for a follow-up visit. Patient educated on when it is appropriate to go to  the emergency department.   Return if symptoms worsen or fail to improve.  Neale Burly, DNP-FNP [facility] [address] [place], [place] [address] [phone]

## 2023-03-23 NOTE — Telephone Encounter (Signed)
Pt called to let Jerrel Ivory know that she went to have the US done and was told that she did not have any blood clots. Was told by Jerrel Ivory that if she didn't have any blood clots, that an antibiotic would be sent to the pharmacy for her.  Pt wants Rx to be sent to [facility] pharmacy in [place].  Please call pt once this is done.

## 2023-03-23 NOTE — Telephone Encounter (Signed)
Pt called back. Said she forgot to mention to [address] to make sure its Doxycyline because that's the one antibiotic that works for her.

## 2023-03-30 ENCOUNTER — Ambulatory Visit (INDEPENDENT_AMBULATORY_CARE_PROVIDER_SITE_OTHER): Payer: Medicare HMO | Admitting: Family Medicine

## 2023-03-30 ENCOUNTER — Encounter: Payer: Self-pay | Admitting: Family Medicine

## 2023-03-30 VITALS — BP 118/73 | HR 81 | Temp 98.2°F | Ht 67.0 in | Wt 181.0 lb

## 2023-03-30 DIAGNOSIS — M7989 Other specified soft tissue disorders: Secondary | ICD-10-CM

## 2023-03-30 DIAGNOSIS — I83893 Varicose veins of bilateral lower extremities with other complications: Secondary | ICD-10-CM | POA: Diagnosis not present

## 2023-03-30 DIAGNOSIS — Z86718 Personal history of other venous thrombosis and embolism: Secondary | ICD-10-CM

## 2023-03-30 MED ORDER — DOXYCYCLINE HYCLATE 100 MG PO TABS
100.0000 mg | ORAL_TABLET | Freq: Two times a day (BID) | ORAL | 0 refills | Status: AC
Start: 2023-03-30 — End: 2023-04-06

## 2023-03-30 MED ORDER — FUROSEMIDE 20 MG PO TABS
20.0000 mg | ORAL_TABLET | ORAL | 0 refills | Status: DC
Start: 2023-03-30 — End: 2023-08-22

## 2023-03-30 NOTE — Patient Instructions (Signed)
Equate Eczema Relief

## 2023-03-30 NOTE — Progress Notes (Addendum)
Subjective:  Patient ID: Cynthia Chavez, female    DOB: 04-06-1956, 67 y.o.   MRN: [id number]  Patient Care Team: Raliegh Ip, DO as PCP - General (Family Medicine) Janalyn Harder, MD (Inactive) as Consulting Physician (Dermatology)   Chief Complaint:  left leg edema   HPI: Cynthia Chavez is a 67 y.o. female presenting on 03/30/2023 for left leg edema   HPI Patient is in today for continued left leg swelling. She states that she is continuing to put oil on it and is wearing her compression socks. She received [place] of LLE and it was negative for DVT. Symptoms started approximately 8/13. She has a history of DVT in her Right LE. She also has history of PE. She has a history of breast cancer, 5 years in remission. Denies chest pain, shortness of breath, fever, and fatigue. She has been elevating her foot. She received doxycyline is is completing antibiotic.   Relevant past medical, surgical, family, and social history reviewed and updated as indicated.  Allergies and medications reviewed and updated. Data reviewed: Chart in Epic.   Past Medical History:  Diagnosis Date   Anxiety    Breast cancer (HCC)    Cancer (HCC)    breast cancer   Depression    Edema    bilateral feet and leg swelling   GERD (gastroesophageal reflux disease)    History of hiatal hernia    History of radiation therapy 02/12/17- 03/26/17   Chest wall and regional nodes, right- 50 Gy in 25 fractions, Chest Wall, boost, right- 10 Gy in 5 fractions.    Hypothyroidism    Malaise    Personal history of chemotherapy    Personal history of radiation therapy    Port catheter in place 09/08/2016   Sprain of shoulder, right 11/23/2020   Thyroid disease    Varicose veins     Past Surgical History:  Procedure Laterality Date   ABDOMINAL HYSTERECTOMY     AXILLARY LYMPH NODE BIOPSY Right 01/03/2017   Procedure: RIGHT AXILLARY LYMPH NODE SEED GUIDED EXCISIONAL BIOPSY;  Surgeon: Emelia Loron, MD;  Location: [facility]  OR;  Service: General;  Laterality: Right;   BREAST SURGERY     CHOLECYSTECTOMY     COLONOSCOPY     ESOPHAGEAL DILATION     MASTECTOMY Right    MASTECTOMY W/ SENTINEL NODE BIOPSY Right 01/03/2017   axillary   MASTECTOMY W/ SENTINEL NODE BIOPSY Right 01/03/2017   Procedure: RIGHT TOTAL MASTECTOMY WITH RIGHT AXILLARY SENTINEL LYMPH NODE BIOPSY;  Surgeon: Emelia Loron, MD;  Location: [facility] OR;  Service: General;  Laterality: Right;   PORTACATH PLACEMENT Left 08/17/2016   Procedure: INSERTION PORT-A-CATH WITH [place];  Surgeon: Emelia Loron, MD;  Location: [facility];  Service: General;  Laterality: Left;   Social History   Socioeconomic History   Marital status: Married    Spouse name: Chrissie Noa   Number of children: 3   Years of education: 11   Highest education level: 11th grade  Occupational History   Occupation: Conservation officer, nature    Comment: [employer] part time  Tobacco Use   Smoking status: Every Day    Current packs/day: 0.50    Average packs/day: 0.5 packs/day for 29.9 years (15.0 ttl pk-yrs)    Types: Cigarettes    Start date: 04/19/1993   Smokeless tobacco: Never   Tobacco comments:    she stopped smoking for a long time, but started back.   Vaping Use  Vaping status: Never Used  Substance and Sexual Activity   Alcohol use: No   Drug use: No   Sexual activity: Not on file  Other Topics Concern   Not on file  Social History Narrative   One level living at home with her husband.   Children live nearby - visits with family once or twice per week.   Active still working part time, working outdoors, gardening, Catering manager.   Social Determinants of Health   Financial Resource Strain: Low Risk  (06/12/2022)   Overall Financial Resource Strain (CARDIA)    Difficulty of Paying Living Expenses: Not hard at all  Food Insecurity: No Food Insecurity (06/12/2022)   Hunger Vital Sign    Worried About Running Out of Food in the Last Year: Never true    Ran Out of Food in  the Last Year: Never true  Transportation Needs: No Transportation Needs (06/12/2022)   PRAPARE - Administrator, Civil Service (Medical): No    Lack of Transportation (Non-Medical): No  Physical Activity: Sufficiently Active (06/12/2022)   Exercise Vital Sign    Days of Exercise per Week: 5 days    Minutes of Exercise per Session: 30 min  Stress: No Stress Concern Present (06/12/2022)   [employer] of Occupational Health - Occupational Stress Questionnaire    Feeling of Stress : Not at all  Social Connections: Socially Integrated (06/12/2022)   Social Connection and Isolation Panel [NHANES]    Frequency of Communication with Friends and Family: More than three times a week    Frequency of Social Gatherings with Friends and Family: More than three times a week    Attends Religious Services: More than 4 times per year    Active Member of [employer] or Organizations: Yes    Attends Engineer, structural: More than 4 times per year    Marital Status: Married  Catering manager Violence: Not At Risk (06/12/2022)   Humiliation, Afraid, Rape, and Kick questionnaire    Fear of Current or Ex-Partner: No    Emotionally Abused: No    Physically Abused: No    Sexually Abused: No    Outpatient Encounter Medications as of 03/30/2023  Medication Sig   acetaminophen (TYLENOL) 500 MG tablet Take 1 tablet (500 mg total) by mouth every 6 (six) hours as needed.   baclofen (LIORESAL) 10 MG tablet Take 1 tablet (10 mg total) by mouth 3 (three) times daily.   betamethasone dipropionate 0.05 % cream Apply topically 2 (two) times daily. As needed for psoriasis   diclofenac (VOLTAREN) 75 MG EC tablet Take 1 tablet (75 mg total) by mouth 2 (two) times daily.   docusate sodium (COLACE) 100 MG capsule Take 1 capsule (100 mg total) by mouth 2 (two) times daily.   doxycycline (VIBRA-TABS) 100 MG tablet Take 1 tablet (100 mg total) by mouth 2 (two) times daily for 7 days.   esomeprazole (NEXIUM) 40  MG capsule Take 1 capsule (40 mg total) by mouth daily at 12 noon.   fluticasone (FLONASE) 50 MCG/ACT nasal spray Place 2 sprays into both nostrils daily.   guaiFENesin (MUCINEX) 600 MG 12 hr tablet Take 1 tablet (600 mg total) by mouth 2 (two) times daily.   letrozole (FEMARA) 2.5 MG tablet Take 1 tablet by mouth once daily   levothyroxine (SYNTHROID) 150 MCG tablet TAKE 1 TABLET BY MOUTH IN THE MORNING BEFORE BREAKFAST EXCEPT TAKE 1/2 TABLET BY MOUTH ON SUNDAYS   No facility-administered encounter medications  on file as of 03/30/2023.    Allergies  Allergen Reactions   Beef-Derived Products Hives, Shortness Of Breath, Swelling and Rash   Ivp Dye [Iodinated Contrast Media] Other (See Comments)    Chest pain   Other Shortness Of Breath, Swelling and Other (See Comments)    Versed or Fentanyl when used for endoscopy caused throat and tongue swelling   Augmentin [Amoxicillin-Pot Clavulanate]    Dilaudid [Hydromorphone Hcl]     PT had CP and felt shaky. Didn't like the feeling, but sx lessened w/in 10 min of IV administration   Codeine Other (See Comments)    spasms    Review of Systems As per HPI     Objective:  BP 118/73   Pulse 81   Temp 98.2 F (36.8 C)   Ht 5\' 7"  (1.702 m)   Wt 181 lb (82.1 kg)   SpO2 97%   BMI 28.35 kg/m    Wt Readings from Last 3 Encounters:  03/30/23 181 lb (82.1 kg)  03/23/23 181 lb (82.1 kg)  03/07/23 186 lb (84.4 kg)    Physical Exam Constitutional:      General: She is awake. She is not in acute distress.    Appearance: Normal appearance. She is well-developed and well-groomed. She is not ill-appearing, toxic-appearing or diaphoretic.  HENT:     Head:     Comments: Hoarse voice Cardiovascular:     Rate and Rhythm: Normal rate.     Pulses: Normal pulses.          Radial pulses are 2+ on the right side and 2+ on the left side.       Posterior tibial pulses are 2+ on the right side and 2+ on the left side.     Heart sounds: Normal heart  sounds. No murmur heard.    No gallop.  Pulmonary:     Effort: Pulmonary effort is normal. No respiratory distress.     Breath sounds: Normal breath sounds. No stridor. No wheezing, rhonchi or rales.  Musculoskeletal:     Cervical back: Full passive range of motion without pain and neck supple.     Right lower leg: No edema.     Left lower leg: 3+ Edema present.  Skin:    General: Skin is warm.     Capillary Refill: Capillary refill takes less than 2 seconds.     Comments: Dry, flaky, erythematous, left lower extremity with hyperpigmented varicose veins   Neurological:     General: No focal deficit present.     Mental Status: She is alert, oriented to person, place, and time and easily aroused. Mental status is at baseline.     GCS: GCS eye subscore is 4. GCS verbal subscore is 5. GCS motor subscore is 6.     Motor: No weakness.  Psychiatric:        Attention and Perception: Attention and perception normal.        Mood and Affect: Mood and affect normal.        Speech: Speech normal.        Behavior: Behavior normal. Behavior is cooperative.        Thought Content: Thought content normal. Thought content does not include homicidal or suicidal ideation. Thought content does not include homicidal or suicidal plan.        Cognition and Memory: Cognition and memory normal.        Judgment: Judgment normal.      Results for orders placed  or performed in visit on 04/18/22  Alpha-Gal Panel  Result Value Ref Range   Class Description Allergens Comment    IgE (Immunoglobulin E), Serum 370 6 - 495 IU/mL   O215-IgE Alpha-Gal 83.40 (A) Class V kU/L   Beef IgE 42.10 (A) Class V kU/L   Pork IgE 30.90 (A) Class V kU/L   Allergen Lamb IgE 17.10 (A) Class IV kU/L  CMP14+EGFR  Result Value Ref Range   Glucose 94 70 - 99 mg/dL   BUN 15 8 - 27 mg/dL   Creatinine, Ser 8.11 0.57 - 1.00 mg/dL   eGFR 84 >91 YN/WGN/5.62   BUN/Creatinine Ratio 19 12 - 28   Sodium 140 134 - 144 mmol/L   Potassium  4.1 3.5 - 5.2 mmol/L   Chloride 102 96 - 106 mmol/L   CO2 24 20 - 29 mmol/L   Calcium 9.1 8.7 - 10.3 mg/dL   Total Protein 6.4 6.0 - 8.5 g/dL   Albumin 4.0 3.9 - 4.9 g/dL   Globulin, Total 2.4 1.5 - 4.5 g/dL   Albumin/Globulin Ratio 1.7 1.2 - 2.2   Bilirubin Total 0.4 0.0 - 1.2 mg/dL   Alkaline Phosphatase 110 44 - 121 IU/L   AST 12 0 - 40 IU/L   ALT 10 0 - 32 IU/L  CBC  Result Value Ref Range   WBC 6.4 3.4 - 10.8 x10E3/uL   RBC 4.23 3.77 - 5.28 x10E6/uL   Hemoglobin 12.7 11.1 - 15.9 g/dL   Hematocrit 13.0 86.5 - 46.6 %   MCV 94 79 - 97 fL   MCH 30.0 26.6 - 33.0 pg   MCHC 32.0 31.5 - 35.7 g/dL   RDW 78.4 69.6 - 29.5 %   Platelets 202 150 - 450 x10E3/uL  TSH  Result Value Ref Range   TSH 0.132 (L) 0.450 - 4.500 uIU/mL  T4, Free  Result Value Ref Range   Free T4 1.45 0.82 - 1.77 ng/dL  VITAMIN D 25 Hydroxy (Vit-D Deficiency, Fractures)  Result Value Ref Range   Vit D, 25-Hydroxy 21.8 (L) 30.0 - 100.0 ng/mL         03/30/2023   10:05 AM 03/23/2023   11:04 AM 02/07/2023    1:53 PM 01/30/2023    8:35 AM 06/12/2022   10:37 AM  Depression screen PHQ 2/9  Decreased Interest 0 0 0 0 0  Down, Depressed, Hopeless 0 0 0 0 0  PHQ - 2 Score 0 0 0 0 0  Altered sleeping 0 0  0 0  Tired, decreased energy 0 0  0 0  Change in appetite 0 0  0 0  Feeling bad or failure about yourself  0 0  0 0  Trouble concentrating 0 0  0 0  Moving slowly or fidgety/restless 0 0  0 0  Suicidal thoughts 0 0  0 0  PHQ-9 Score 0 0  0 0  Difficult doing work/chores Not difficult at all Not difficult at all  Not difficult at all Not difficult at all       03/30/2023   10:05 AM 03/23/2023   11:04 AM 01/30/2023    8:35 AM 04/18/2022   11:28 AM  GAD 7 : Generalized Anxiety Score  Nervous, Anxious, on Edge 0 0 0 0  Control/stop worrying 0 0 0 0  Worry too much - different things 0 0 0 0  Trouble relaxing 0 0 0 0  Restless 0 0 0 0  Easily annoyed or irritable  0 0 0 0  Afraid - awful might happen 0 0 0 0   Total GAD 7 Score 0 0 0 0  Anxiety Difficulty Not difficult at all Not difficult at all Not difficult at all Not difficult at all   Pertinent labs & imaging results that were available during my care of the patient were reviewed by me and considered in my medical decision making.  Assessment & Plan:  Marshia was seen today for left leg edema.  Diagnoses and all orders for this visit:  Swelling of left lower extremity Empirically treated with antibiotics as below for cellulitis on 03/23/23. Patient to complete abx. Will start low dose Lasix and plan to monitor CMP at follow up to monitor electrolytes. Will refer as below given significant history of clotting events. Labs as below. Will communicate results to patient once available. Will await results to determine next steps.  Discussed strict return precautions.  -     Ambulatory referral to Vascular Surgery -     furosemide (LASIX) 20 MG tablet; Take 1 tablet (20 mg total) by mouth every other day. -     doxycycline (VIBRA-TABS) 100 MG tablet; Take 1 tablet (100 mg total) by mouth 2 (two) times daily for 7 days. -     CMP14+EGFR; Future -     CBC with Differential/Platelet; Future -     Magnesium; Future  Varicose veins of bilateral lower extremities with other complications Referral as below.  -     Ambulatory referral to Vascular Surgery  History of DVT in adulthood Referral as below. Reviewed Bilateral US 07/25/2021 which was positive for DVT in right femoral, popliteal, posterior tibial, and peroneal veins. Reviewed Left Venous US 03/23/2023 which was negative for DVT  -     Ambulatory referral to Vascular Surgery  US Venous Img Lower Unilateral Left (DVT)  IMPRESSION: No evidence of left lower extremity DVT     Electronically Signed   By: Karen Kays M.D.   On: 03/23/2023 13:30  US Venous Img Lower Bilateral   IMPRESSION: Positive for deep venous thrombosis involving the right femoral, popliteal, posterior tibial, and  peroneal veins.   Findings discussed with Dr. Aletha Halim via telephone at 11:50 a.m.     Electronically Signed   By: Feliberto Harts M.D.   On: 07/25/2021 11:53  Continue all other maintenance medications.  Follow up plan: Return for Chronic Condition Follow up.  Continue healthy lifestyle choices, including diet (rich in fruits, vegetables, and lean proteins, and low in salt and simple carbohydrates) and exercise (at least 30 minutes of moderate physical activity daily).  Written and verbal instructions provided   The above assessment and management plan was discussed with the patient. The patient verbalized understanding of and has agreed to the management plan. Patient is aware to call the clinic if they develop any new symptoms or if symptoms persist or worsen. Patient is aware when to return to the clinic for a follow-up visit. Patient educated on when it is appropriate to go to the emergency department.   Neale Burly, DNP-FNP [facility] [address] [place], [place] [address] [phone]

## 2023-04-17 ENCOUNTER — Ambulatory Visit (INDEPENDENT_AMBULATORY_CARE_PROVIDER_SITE_OTHER): Payer: Medicare HMO | Admitting: Family Medicine

## 2023-04-17 ENCOUNTER — Encounter: Payer: Self-pay | Admitting: Family Medicine

## 2023-04-17 VITALS — BP 102/68 | HR 76 | Temp 98.2°F | Ht 67.0 in | Wt 180.0 lb

## 2023-04-17 DIAGNOSIS — E7800 Pure hypercholesterolemia, unspecified: Secondary | ICD-10-CM

## 2023-04-17 DIAGNOSIS — E559 Vitamin D deficiency, unspecified: Secondary | ICD-10-CM | POA: Diagnosis not present

## 2023-04-17 DIAGNOSIS — E039 Hypothyroidism, unspecified: Secondary | ICD-10-CM | POA: Diagnosis not present

## 2023-04-17 DIAGNOSIS — M7989 Other specified soft tissue disorders: Secondary | ICD-10-CM

## 2023-04-17 NOTE — Progress Notes (Signed)
Subjective:  Patient ID: Cynthia Chavez, female    DOB: 07-24-1956, 67 y.o.   MRN: [id number]  Patient Care Team: Raliegh Ip, DO as PCP - General (Family Medicine) Janalyn Harder, MD (Inactive) as Consulting Physician (Dermatology)   Chief Complaint:  Edema (Left leg)  HPI: Cynthia Chavez is a 67 y.o. female presenting on 04/17/2023 for Edema (Left leg)  HPI 1. Swelling of left lower extremity Patient was seen on 03/30/23 for LLE swelling. Patient provided low dose Lasix at that time. Has completed doxycycline. Placed referral to vascular surgery at that time due to history of DVT in adulthood. Denies fever and pain. States that she is "feeling normal"   Took Lasix for 3 days and states that it did not increase her urination.  Reports that her leg is no longer bothering her.  Typically wears compression stockings, but is not today. Reports that she is elevating her legs at night.  Has not followed up vascular, has appt in October.   Relevant past medical, surgical, family, and social history reviewed and updated as indicated.  Allergies and medications reviewed and updated. Data reviewed: Chart in Epic.  Past Medical History:  Diagnosis Date   Anxiety    Breast cancer (HCC)    Cancer (HCC)    breast cancer   Depression    Edema    bilateral feet and leg swelling   GERD (gastroesophageal reflux disease)    History of hiatal hernia    History of radiation therapy 02/12/17- 03/26/17   Chest wall and regional nodes, right- 50 Gy in 25 fractions, Chest Wall, boost, right- 10 Gy in 5 fractions.    Hypothyroidism    Malaise    Personal history of chemotherapy    Personal history of radiation therapy    Port catheter in place 09/08/2016   Sprain of shoulder, right 11/23/2020   Thyroid disease    Varicose veins     Past Surgical History:  Procedure Laterality Date   ABDOMINAL HYSTERECTOMY     AXILLARY LYMPH NODE BIOPSY Right 01/03/2017   Procedure: RIGHT AXILLARY LYMPH  NODE SEED GUIDED EXCISIONAL BIOPSY;  Surgeon: Emelia Loron, MD;  Location: [facility] OR;  Service: General;  Laterality: Right;   BREAST SURGERY     CHOLECYSTECTOMY     COLONOSCOPY     ESOPHAGEAL DILATION     MASTECTOMY Right    MASTECTOMY W/ SENTINEL NODE BIOPSY Right 01/03/2017   axillary   MASTECTOMY W/ SENTINEL NODE BIOPSY Right 01/03/2017   Procedure: RIGHT TOTAL MASTECTOMY WITH RIGHT AXILLARY SENTINEL LYMPH NODE BIOPSY;  Surgeon: Emelia Loron, MD;  Location: [facility] OR;  Service: General;  Laterality: Right;   PORTACATH PLACEMENT Left 08/17/2016   Procedure: INSERTION PORT-A-CATH WITH [place];  Surgeon: Emelia Loron, MD;  Location: [facility];  Service: General;  Laterality: Left;    Social History   Socioeconomic History   Marital status: Married    Spouse name: Chrissie Noa   Number of children: 3   Years of education: 11   Highest education level: 11th grade  Occupational History   Occupation: Conservation officer, nature    Comment: [employer] part time  Tobacco Use   Smoking status: Every Day    Current packs/day: 0.50    Average packs/day: 0.5 packs/day for 30.0 years (15.0 ttl pk-yrs)    Types: Cigarettes    Start date: 04/19/1993   Smokeless tobacco: Never   Tobacco comments:    she stopped smoking  for a long time, but started back.   Vaping Use   Vaping status: Never Used  Substance and Sexual Activity   Alcohol use: No   Drug use: No   Sexual activity: Not on file  Other Topics Concern   Not on file  Social History Narrative   One level living at home with her husband.   Children live nearby - visits with family once or twice per week.   Active still working part time, working outdoors, gardening, Catering manager.   Social Determinants of Health   Financial Resource Strain: Low Risk  (06/12/2022)   Overall Financial Resource Strain (CARDIA)    Difficulty of Paying Living Expenses: Not hard at all  Food Insecurity: No Food Insecurity (06/12/2022)   Hunger Vital Sign     Worried About Running Out of Food in the Last Year: Never true    Ran Out of Food in the Last Year: Never true  Transportation Needs: No Transportation Needs (06/12/2022)   PRAPARE - Administrator, Civil Service (Medical): No    Lack of Transportation (Non-Medical): No  Physical Activity: Sufficiently Active (06/12/2022)   Exercise Vital Sign    Days of Exercise per Week: 5 days    Minutes of Exercise per Session: 30 min  Stress: No Stress Concern Present (06/12/2022)   [employer] of Occupational Health - Occupational Stress Questionnaire    Feeling of Stress : Not at all  Social Connections: Socially Integrated (06/12/2022)   Social Connection and Isolation Panel [NHANES]    Frequency of Communication with Friends and Family: More than three times a week    Frequency of Social Gatherings with Friends and Family: More than three times a week    Attends Religious Services: More than 4 times per year    Active Member of [employer] or Organizations: Yes    Attends Engineer, structural: More than 4 times per year    Marital Status: Married  Catering manager Violence: Not At Risk (06/12/2022)   Humiliation, Afraid, Rape, and Kick questionnaire    Fear of Current or Ex-Partner: No    Emotionally Abused: No    Physically Abused: No    Sexually Abused: No    Outpatient Encounter Medications as of 04/17/2023  Medication Sig   acetaminophen (TYLENOL) 500 MG tablet Take 1 tablet (500 mg total) by mouth every 6 (six) hours as needed.   baclofen (LIORESAL) 10 MG tablet Take 1 tablet (10 mg total) by mouth 3 (three) times daily.   betamethasone dipropionate 0.05 % cream Apply topically 2 (two) times daily. As needed for psoriasis   diclofenac (VOLTAREN) 75 MG EC tablet Take 1 tablet (75 mg total) by mouth 2 (two) times daily.   docusate sodium (COLACE) 100 MG capsule Take 1 capsule (100 mg total) by mouth 2 (two) times daily.   esomeprazole (NEXIUM) 40 MG capsule Take 1 capsule  (40 mg total) by mouth daily at 12 noon.   fluticasone (FLONASE) 50 MCG/ACT nasal spray Place 2 sprays into both nostrils daily.   furosemide (LASIX) 20 MG tablet Take 1 tablet (20 mg total) by mouth every other day.   guaiFENesin (MUCINEX) 600 MG 12 hr tablet Take 1 tablet (600 mg total) by mouth 2 (two) times daily.   letrozole (FEMARA) 2.5 MG tablet Take 1 tablet by mouth once daily   levothyroxine (SYNTHROID) 150 MCG tablet TAKE 1 TABLET BY MOUTH IN THE MORNING BEFORE BREAKFAST EXCEPT TAKE 1/2 TABLET BY  MOUTH ON SUNDAYS   No facility-administered encounter medications on file as of 04/17/2023.    Allergies  Allergen Reactions   Beef-Derived Products Hives, Shortness Of Breath, Swelling and Rash   Ivp Dye [Iodinated Contrast Media] Other (See Comments)    Chest pain   Other Shortness Of Breath, Swelling and Other (See Comments)    Versed or Fentanyl when used for endoscopy caused throat and tongue swelling   Augmentin [Amoxicillin-Pot Clavulanate]    Dilaudid [Hydromorphone Hcl]     PT had CP and felt shaky. Didn't like the feeling, but sx lessened w/in 10 min of IV administration   Codeine Other (See Comments)    spasms    Review of Systems As per HPI  Objective:  BP 102/68   Pulse 76   Temp 98.2 F (36.8 C)   Ht 5\' 7"  (1.702 m)   Wt 180 lb (81.6 kg)   SpO2 95%   BMI 28.19 kg/m    Wt Readings from Last 3 Encounters:  03/30/23 181 lb (82.1 kg)  03/23/23 181 lb (82.1 kg)  03/07/23 186 lb (84.4 kg)   Physical Exam Constitutional:      General: She is awake. She is not in acute distress.    Appearance: Normal appearance. She is well-developed and well-groomed. She is not ill-appearing, toxic-appearing or diaphoretic.  HENT:     Head:     Comments: Hoarse voice  Cardiovascular:     Rate and Rhythm: Normal rate and regular rhythm.     Pulses: Normal pulses.          Radial pulses are 2+ on the right side and 2+ on the left side.       Posterior tibial pulses are 2+  on the right side and 2+ on the left side.     Heart sounds: Normal heart sounds. No murmur heard.    No gallop.     Comments: Varicose veins present bilateral lower extremities.  Pulmonary:     Effort: Pulmonary effort is normal. No respiratory distress.     Breath sounds: Normal breath sounds. No stridor. No wheezing, rhonchi or rales.  Musculoskeletal:     Cervical back: Full passive range of motion without pain and neck supple.     Right lower leg: 1+ Edema present.     Left lower leg: 1+ Edema present.  Skin:    General: Skin is warm.     Capillary Refill: Capillary refill takes less than 2 seconds.  Neurological:     General: No focal deficit present.     Mental Status: She is alert, oriented to person, place, and time and easily aroused. Mental status is at baseline.     GCS: GCS eye subscore is 4. GCS verbal subscore is 5. GCS motor subscore is 6.     Motor: No weakness.  Psychiatric:        Attention and Perception: Attention and perception normal.        Mood and Affect: Mood and affect normal.        Speech: Speech normal.        Behavior: Behavior normal. Behavior is cooperative.        Thought Content: Thought content normal. Thought content does not include homicidal or suicidal ideation. Thought content does not include homicidal or suicidal plan.        Cognition and Memory: Cognition and memory normal.        Judgment: Judgment normal.    Results  for orders placed or performed in visit on 04/18/22  Alpha-Gal Panel  Result Value Ref Range   Class Description Allergens Comment    IgE (Immunoglobulin E), Serum 370 6 - 495 IU/mL   O215-IgE Alpha-Gal 83.40 (A) Class V kU/L   Beef IgE 42.10 (A) Class V kU/L   Pork IgE 30.90 (A) Class V kU/L   Allergen Lamb IgE 17.10 (A) Class IV kU/L  CMP14+EGFR  Result Value Ref Range   Glucose 94 70 - 99 mg/dL   BUN 15 8 - 27 mg/dL   Creatinine, Ser 2.59 0.57 - 1.00 mg/dL   eGFR 84 >56 LO/VFI/4.33   BUN/Creatinine Ratio 19 12  - 28   Sodium 140 134 - 144 mmol/L   Potassium 4.1 3.5 - 5.2 mmol/L   Chloride 102 96 - 106 mmol/L   CO2 24 20 - 29 mmol/L   Calcium 9.1 8.7 - 10.3 mg/dL   Total Protein 6.4 6.0 - 8.5 g/dL   Albumin 4.0 3.9 - 4.9 g/dL   Globulin, Total 2.4 1.5 - 4.5 g/dL   Albumin/Globulin Ratio 1.7 1.2 - 2.2   Bilirubin Total 0.4 0.0 - 1.2 mg/dL   Alkaline Phosphatase 110 44 - 121 IU/L   AST 12 0 - 40 IU/L   ALT 10 0 - 32 IU/L  CBC  Result Value Ref Range   WBC 6.4 3.4 - 10.8 x10E3/uL   RBC 4.23 3.77 - 5.28 x10E6/uL   Hemoglobin 12.7 11.1 - 15.9 g/dL   Hematocrit 29.5 18.8 - 46.6 %   MCV 94 79 - 97 fL   MCH 30.0 26.6 - 33.0 pg   MCHC 32.0 31.5 - 35.7 g/dL   RDW 41.6 60.6 - 30.1 %   Platelets 202 150 - 450 x10E3/uL  TSH  Result Value Ref Range   TSH 0.132 (L) 0.450 - 4.500 uIU/mL  T4, Free  Result Value Ref Range   Free T4 1.45 0.82 - 1.77 ng/dL  VITAMIN D 25 Hydroxy (Vit-D Deficiency, Fractures)  Result Value Ref Range   Vit D, 25-Hydroxy 21.8 (L) 30.0 - 100.0 ng/mL       03/30/2023   10:05 AM 03/23/2023   11:04 AM 02/07/2023    1:53 PM 01/30/2023    8:35 AM 06/12/2022   10:37 AM  Depression screen PHQ 2/9  Decreased Interest 0 0 0 0 0  Down, Depressed, Hopeless 0 0 0 0 0  PHQ - 2 Score 0 0 0 0 0  Altered sleeping 0 0  0 0  Tired, decreased energy 0 0  0 0  Change in appetite 0 0  0 0  Feeling bad or failure about yourself  0 0  0 0  Trouble concentrating 0 0  0 0  Moving slowly or fidgety/restless 0 0  0 0  Suicidal thoughts 0 0  0 0  PHQ-9 Score 0 0  0 0  Difficult doing work/chores Not difficult at all Not difficult at all  Not difficult at all Not difficult at all       03/30/2023   10:05 AM 03/23/2023   11:04 AM 01/30/2023    8:35 AM 04/18/2022   11:28 AM  GAD 7 : Generalized Anxiety Score  Nervous, Anxious, on Edge 0 0 0 0  Control/stop worrying 0 0 0 0  Worry too much - different things 0 0 0 0  Trouble relaxing 0 0 0 0  Restless 0 0 0 0  Easily annoyed or  irritable  0 0 0 0  Afraid - awful might happen 0 0 0 0  Total GAD 7 Score 0 0 0 0  Anxiety Difficulty Not difficult at all Not difficult at all Not difficult at all Not difficult at all   Pertinent labs & imaging results that were available during my care of the patient were reviewed by me and considered in my medical decision making.  Assessment & Plan:  Tyrese was seen today for edema.  Diagnoses and all orders for this visit:  Swelling of left lower extremity Labs as previously ordered. Will communicate results to patient once available. Will await results to determine next steps. Patient to follow up with Vascular as scheduled. Patient to follow up with PCP as needed.   Acquired hypothyroidism Labs as below. Will communicate results to patient once available. Will await results to determine next steps.  -     Thyroid Panel With TSH  Pure hypercholesterolemia Labs as below. Will communicate results to patient once available. Will await results to determine next steps.  Not fasting  -     Lipid panel  Vitamin D deficiency Labs as below. Will communicate results to patient once available. Will await results to determine next steps.  -     VITAMIN D 25 Hydroxy (Vit-D Deficiency, Fractures)  Continue all other maintenance medications.  Follow up plan: Return if symptoms worsen or fail to improve.  Continue healthy lifestyle choices, including diet (rich in fruits, vegetables, and lean proteins, and low in salt and simple carbohydrates) and exercise (at least 30 minutes of moderate physical activity daily).  Written and verbal instructions provided   The above assessment and management plan was discussed with the patient. The patient verbalized understanding of and has agreed to the management plan. Patient is aware to call the clinic if they develop any new symptoms or if symptoms persist or worsen. Patient is aware when to return to the clinic for a follow-up visit. Patient educated on when  it is appropriate to go to the emergency department.   Neale Burly, DNP-FNP [facility] [address] [place], [place] [address] [phone]

## 2023-04-18 ENCOUNTER — Other Ambulatory Visit: Payer: Self-pay | Admitting: Family Medicine

## 2023-04-18 DIAGNOSIS — E039 Hypothyroidism, unspecified: Secondary | ICD-10-CM

## 2023-04-18 LAB — THYROID PANEL WITH TSH
Free Thyroxine Index: 2.9 (ref 1.2–4.9)
T3 Uptake Ratio: 30 % (ref 24–39)
T4, Total: 9.8 ug/dL (ref 4.5–12.0)
TSH: 0.377 u[IU]/mL — ABNORMAL LOW (ref 0.450–4.500)

## 2023-04-18 LAB — LIPID PANEL
Chol/HDL Ratio: 5.3 ratio — ABNORMAL HIGH (ref 0.0–4.4)
Cholesterol, Total: 187 mg/dL (ref 100–199)
HDL: 35 mg/dL — ABNORMAL LOW (ref >39)
LDL Chol Calc (NIH): 125 mg/dL — ABNORMAL HIGH (ref 0–99)
Triglycerides: 149 mg/dL (ref 0–149)
VLDL Cholesterol Cal: 27 mg/dL (ref 5–40)

## 2023-04-18 LAB — VITAMIN D 25 HYDROXY (VIT D DEFICIENCY, FRACTURES): Vit D, 25-Hydroxy: 24.9 ng/mL — ABNORMAL LOW (ref 30.0–100.0)

## 2023-04-18 MED ORDER — LEVOTHYROXINE SODIUM 150 MCG PO TABS
150.0000 ug | ORAL_TABLET | Freq: Every day | ORAL | 0 refills | Status: DC
Start: 2023-04-18 — End: 2023-08-17

## 2023-04-18 NOTE — Progress Notes (Signed)
Please tell patient that she can omit/skip Sunday's dose. Would like to repeat labs in 3 months. She can also try red yeast rice for her cholesterol.

## 2023-04-18 NOTE — Progress Notes (Signed)
Please have patient come back for other labs.  TSH is slightly low, can we make sure that she is taking her levothyroxine as prescribed. She is supposed to take a half a tablet on Sundays.   Vitamin D is slightly low. Recommend a daily OTC vitamin D supplement with 1000-2000 IU.  Cholesterol is elevated. Diet encouraged - increase intake of fresh fruits and vegetables, increase intake of lean proteins. Bake, broil, or grill foods. Avoid fried, greasy, and fatty foods. Avoid fast foods. Increase intake of fiber-rich whole grains. Exercise encouraged - at least 150 minutes per week and advance as tolerated.  Based on ASCVD risk score, reasonable for patient to start a statin. If agreeable, I can send one in for her.  The 10-year ASCVD risk score (Arnett DK, et al., 2019) is: 10%   Values used to calculate the score:     Age: 67 years     Sex: Female     Is Non-Hispanic African American: No     Diabetic: No     Tobacco smoker: Yes     Systolic Blood Pressure: 102 mmHg     Is BP treated: No     HDL Cholesterol: 35 mg/dL     Total Cholesterol: 187 mg/dL

## 2023-04-19 ENCOUNTER — Other Ambulatory Visit: Payer: Medicare HMO

## 2023-04-19 DIAGNOSIS — M7989 Other specified soft tissue disorders: Secondary | ICD-10-CM

## 2023-04-19 DIAGNOSIS — R6889 Other general symptoms and signs: Secondary | ICD-10-CM | POA: Diagnosis not present

## 2023-04-20 LAB — CMP14+EGFR
ALT: 9 IU/L (ref 0–32)
AST: 12 IU/L (ref 0–40)
Albumin: 4.1 g/dL (ref 3.9–4.9)
Alkaline Phosphatase: 115 IU/L (ref 44–121)
BUN/Creatinine Ratio: 18 (ref 12–28)
BUN: 13 mg/dL (ref 8–27)
Bilirubin Total: 0.6 mg/dL (ref 0.0–1.2)
CO2: 21 mmol/L (ref 20–29)
Calcium: 9.1 mg/dL (ref 8.7–10.3)
Chloride: 105 mmol/L (ref 96–106)
Creatinine, Ser: 0.74 mg/dL (ref 0.57–1.00)
Globulin, Total: 2.3 g/dL (ref 1.5–4.5)
Glucose: 96 mg/dL (ref 70–99)
Potassium: 4 mmol/L (ref 3.5–5.2)
Sodium: 142 mmol/L (ref 134–144)
Total Protein: 6.4 g/dL (ref 6.0–8.5)
eGFR: 88 mL/min/{1.73_m2} (ref >59)

## 2023-04-20 LAB — CBC WITH DIFFERENTIAL/PLATELET
Basophils Absolute: 0 10*3/uL (ref 0.0–0.2)
Basos: 0 %
EOS (ABSOLUTE): 0.3 10*3/uL (ref 0.0–0.4)
Eos: 3 %
Hematocrit: 38.8 % (ref 34.0–46.6)
Hemoglobin: 13.1 g/dL (ref 11.1–15.9)
Immature Grans (Abs): 0 10*3/uL (ref 0.0–0.1)
Immature Granulocytes: 0 %
Lymphocytes Absolute: 1.4 10*3/uL (ref 0.7–3.1)
Lymphs: 18 %
MCH: 32.2 pg (ref 26.6–33.0)
MCHC: 33.8 g/dL (ref 31.5–35.7)
MCV: 95 fL (ref 79–97)
Monocytes Absolute: 0.5 10*3/uL (ref 0.1–0.9)
Monocytes: 6 %
Neutrophils Absolute: 5.8 10*3/uL (ref 1.4–7.0)
Neutrophils: 73 %
Platelets: 180 10*3/uL (ref 150–450)
RBC: 4.07 x10E6/uL (ref 3.77–5.28)
RDW: 12.2 % (ref 11.7–15.4)
WBC: 7.9 10*3/uL (ref 3.4–10.8)

## 2023-04-20 LAB — MAGNESIUM: Magnesium: 2 mg/dL (ref 1.6–2.3)

## 2023-04-20 NOTE — Progress Notes (Signed)
All labs normal

## 2023-05-07 ENCOUNTER — Other Ambulatory Visit: Payer: Self-pay | Admitting: *Deleted

## 2023-05-07 DIAGNOSIS — I83893 Varicose veins of bilateral lower extremities with other complications: Secondary | ICD-10-CM

## 2023-05-11 ENCOUNTER — Other Ambulatory Visit: Payer: Self-pay | Admitting: Family Medicine

## 2023-05-11 DIAGNOSIS — Z1212 Encounter for screening for malignant neoplasm of rectum: Secondary | ICD-10-CM

## 2023-05-11 DIAGNOSIS — Z1211 Encounter for screening for malignant neoplasm of colon: Secondary | ICD-10-CM

## 2023-05-17 ENCOUNTER — Ambulatory Visit (HOSPITAL_COMMUNITY)
Admission: RE | Admit: 2023-05-17 | Discharge: 2023-05-17 | Disposition: A | Discharge status: Home / Self Care | Payer: Medicare HMO | Source: Ambulatory Visit | Attending: Surgery | Admitting: Surgery

## 2023-05-17 ENCOUNTER — Ambulatory Visit: Payer: Medicare HMO | Admitting: Physician Assistant

## 2023-05-17 VITALS — BP 144/85 | HR 81 | Temp 98.1°F | Resp 18 | Ht 67.0 in | Wt 180.6 lb

## 2023-05-17 DIAGNOSIS — I83893 Varicose veins of bilateral lower extremities with other complications: Secondary | ICD-10-CM | POA: Diagnosis not present

## 2023-05-17 NOTE — Progress Notes (Signed)
Requested by:  Raliegh Ip, DO [address] [place],  [place] [address]  Reason for consultation: varicose veins    History of Present Illness   Cynthia Chavez is a 67 y.o. (Oct 25, 1955) female who presents for evaluation of bilateral lower extremity varicose veins. She explains that she has had varicose veins for many years. She has had prior treatment of spider veins/ reticular veins in [place] 20 years ago. She was also previously seen in our practice by Dr. Hart Rochester in 2015 for evaluation of her varicose veins. She explains at this time that they seem to have come back more now. She is anticipating needing knee surgery so wanted to make sure her veins were okay prior to that. She also has had recurrent episodes of venous dermatitis in left ankle. She denies any real aching, throbbing, burning, itching, stinging, bleeding or heaviness.She does have pain in left knee and also neuropathy from Chemotherapy. She does get swelling from time to time. This is improved with elevation. She explains also that she previously wore compression stockings but has not worn them in several years. She has no history of DVT. She does have significant family history of venous disease.   Venous symptoms include: varicose veins, swelling Onset/duration:  > 20 years  Occupation:  self employed Psychologist, sport and exercise Aggravating factors: sitting, standing Alleviating factors: elevation Compression:  no Helps:  no Pain medications:  none Previous vein procedures:  yes, injections History of DVT:  no  Past Medical History:  Diagnosis Date   Anxiety    Breast cancer (HCC)    Cancer (HCC)    breast cancer   Depression    Edema    bilateral feet and leg swelling   GERD (gastroesophageal reflux disease)    History of hiatal hernia    History of radiation therapy 02/12/17- 03/26/17   Chest wall and regional nodes, right- 50 Gy in 25 fractions, Chest Wall, boost, right- 10 Gy in 5 fractions.    Hypothyroidism     Malaise    Personal history of chemotherapy    Personal history of radiation therapy    Port catheter in place 09/08/2016   Sprain of shoulder, right 11/23/2020   Thyroid disease    Varicose veins     Past Surgical History:  Procedure Laterality Date   ABDOMINAL HYSTERECTOMY     AXILLARY LYMPH NODE BIOPSY Right 01/03/2017   Procedure: RIGHT AXILLARY LYMPH NODE SEED GUIDED EXCISIONAL BIOPSY;  Surgeon: Emelia Loron, MD;  Location: [facility] OR;  Service: General;  Laterality: Right;   BREAST SURGERY     CHOLECYSTECTOMY     COLONOSCOPY     ESOPHAGEAL DILATION     MASTECTOMY Right    MASTECTOMY W/ SENTINEL NODE BIOPSY Right 01/03/2017   axillary   MASTECTOMY W/ SENTINEL NODE BIOPSY Right 01/03/2017   Procedure: RIGHT TOTAL MASTECTOMY WITH RIGHT AXILLARY SENTINEL LYMPH NODE BIOPSY;  Surgeon: Emelia Loron, MD;  Location: [facility] OR;  Service: General;  Laterality: Right;   PORTACATH PLACEMENT Left 08/17/2016   Procedure: INSERTION PORT-A-CATH WITH [place];  Surgeon: Emelia Loron, MD;  Location: [facility];  Service: General;  Laterality: Left;    Social History   Socioeconomic History   Marital status: Married    Spouse name: Chrissie Noa   Number of children: 3   Years of education: 11   Highest education level: 11th grade  Occupational History   Occupation: Conservation officer, nature    Comment: [employer] part time  Tobacco Use   Smoking status: Every Day    Current packs/day: 0.50    Average packs/day: 0.5 packs/day for 30.1 years (15.0 ttl pk-yrs)    Types: Cigarettes    Start date: 04/19/1993   Smokeless tobacco: Never   Tobacco comments:    she stopped smoking for a long time, but started back.   Vaping Use   Vaping status: Never Used  Substance and Sexual Activity   Alcohol use: No   Drug use: No   Sexual activity: Not on file  Other Topics Concern   Not on file  Social History Narrative   One level living at home with her husband.   Children live nearby - visits with  family once or twice per week.   Active still working part time, working outdoors, gardening, Catering manager.   Social Determinants of Health   Financial Resource Strain: Low Risk  (06/12/2022)   Overall Financial Resource Strain (CARDIA)    Difficulty of Paying Living Expenses: Not hard at all  Food Insecurity: No Food Insecurity (06/12/2022)   Hunger Vital Sign    Worried About Running Out of Food in the Last Year: Never true    Ran Out of Food in the Last Year: Never true  Transportation Needs: No Transportation Needs (06/12/2022)   PRAPARE - Administrator, Civil Service (Medical): No    Lack of Transportation (Non-Medical): No  Physical Activity: Sufficiently Active (06/12/2022)   Exercise Vital Sign    Days of Exercise per Week: 5 days    Minutes of Exercise per Session: 30 min  Stress: No Stress Concern Present (06/12/2022)   [employer] of Occupational Health - Occupational Stress Questionnaire    Feeling of Stress : Not at all  Social Connections: Socially Integrated (06/12/2022)   Social Connection and Isolation Panel [NHANES]    Frequency of Communication with Friends and Family: More than three times a week    Frequency of Social Gatherings with Friends and Family: More than three times a week    Attends Religious Services: More than 4 times per year    Active Member of [employer] or Organizations: Yes    Attends Engineer, structural: More than 4 times per year    Marital Status: Married  Catering manager Violence: Not At Risk (06/12/2022)   Humiliation, Afraid, Rape, and Kick questionnaire    Fear of Current or Ex-Partner: No    Emotionally Abused: No    Physically Abused: No    Sexually Abused: No    Family History  Problem Relation Age of Onset   COPD Father    Cancer Father     Current Outpatient Medications  Medication Sig Dispense Refill   acetaminophen (TYLENOL) 500 MG tablet Take 1 tablet (500 mg total) by mouth every 6 (six) hours as needed. 30  tablet 0   baclofen (LIORESAL) 10 MG tablet Take 1 tablet (10 mg total) by mouth 3 (three) times daily. 30 each 0   betamethasone dipropionate 0.05 % cream Apply topically 2 (two) times daily. As needed for psoriasis 45 g 1   diclofenac (VOLTAREN) 75 MG EC tablet Take 1 tablet (75 mg total) by mouth 2 (two) times daily. 30 tablet 0   docusate sodium (COLACE) 100 MG capsule Take 1 capsule (100 mg total) by mouth 2 (two) times daily. 10 capsule 0   esomeprazole (NEXIUM) 40 MG capsule Take 1 capsule (40 mg total) by mouth daily at 12 noon. 90 capsule  3   fluticasone (FLONASE) 50 MCG/ACT nasal spray Place 2 sprays into both nostrils daily. 16 g 6   furosemide (LASIX) 20 MG tablet Take 1 tablet (20 mg total) by mouth every other day. 30 tablet 0   guaiFENesin (MUCINEX) 600 MG 12 hr tablet Take 1 tablet (600 mg total) by mouth 2 (two) times daily. 30 tablet 0   letrozole (FEMARA) 2.5 MG tablet Take 1 tablet by mouth once daily 90 tablet 0   levothyroxine (SYNTHROID) 150 MCG tablet Take 1 tablet (150 mcg total) by mouth daily before breakfast. TAKE DAILY, EXCEPT FOR SUNDAY. OMIT SUNDAY DOSE 90 tablet 0   No current facility-administered medications for this visit.    Allergies  Allergen Reactions   Beef-Derived Products Hives, Shortness Of Breath, Swelling and Rash   Ivp Dye [Iodinated Contrast Media] Other (See Comments)    Chest pain   Other Shortness Of Breath, Swelling and Other (See Comments)    Versed or Fentanyl when used for endoscopy caused throat and tongue swelling   Augmentin [Amoxicillin-Pot Clavulanate]    Dilaudid [Hydromorphone Hcl]     PT had CP and felt shaky. Didn't like the feeling, but sx lessened w/in 10 min of IV administration   Codeine Other (See Comments)    spasms    REVIEW OF SYSTEMS (negative unless checked):   Cardiac:  []  Chest pain or chest pressure? []  Shortness of breath upon activity? []  Shortness of breath when lying flat? []  Irregular heart  rhythm?  Vascular:  []  Pain in calf, thigh, or hip brought on by walking? []  Pain in feet at night that wakes you up from your sleep? []  Blood clot in your veins? [x]  Leg swelling?  Pulmonary:  []  Oxygen at home? []  Productive cough? []  Wheezing?  Neurologic:  []  Sudden weakness in arms or legs? []  Sudden numbness in arms or legs? []  Sudden onset of difficult speaking or slurred speech? []  Temporary loss of vision in one eye? []  Problems with dizziness?  Gastrointestinal:  []  Blood in stool? []  Vomited blood?  Genitourinary:  []  Burning when urinating? []  Blood in urine?  Psychiatric:  []  Major depression  Hematologic:  []  Bleeding problems? []  Problems with blood clotting?  Dermatologic:  []  Rashes or ulcers?  Constitutional:  []  Fever or chills?  Ear/Nose/Throat:  []  Change in hearing? []  Nose bleeds? []  Sore throat?  Musculoskeletal:  []  Back pain? []  Joint pain? []  Muscle pain?   Physical Examination     Vitals:   05/17/23 1128  BP: (!) 144/85  Pulse: 81  Resp: 18  Temp: 98.1 F (36.7 C)  SpO2: 95%  Weight: 180 lb 9.6 oz (81.9 kg)  Height: 5\' 7"  (1.702 m)   Body mass index is 28.29 kg/m.  General:  WDWN in NAD; vital signs documented above Gait: Normal HENT: WNL, normocephalic Pulmonary: normal non-labored breathing , without wheezing Cardiac: regular HR Abdomen: soft Vascular Exam/Pulses:2+ femoral pulses, popliteal, DP and PT pulses bilaterally Extremities: with varicose veins left leg > right, with reticular veins, with edema, without stasis pigmentation, without lipodermatosclerosis, without ulcers. Small area of erythematous macular rash of left distal leg, scaly skin Musculoskeletal: no muscle wasting or atrophy  Neurologic: A&O X 3;  No focal weakness or paresthesias are detected Psychiatric:  The pt has  affect.  Non-invasive Vascular Imaging   BLE Venous Insufficiency Duplex (05/17/23):   LLE: No DVT and SVT GSV  reflux  SFJ to distal thigh, mid calf GSV  diameter 0.34 -1.3 cm SSV reflux proximal and mid calf CFV deep venous reflux AASV that is incompetent   Medical Decision Making   LEIGHANNA REYNOLDS is a 67 y.o. female who presents with: LLE chronic venous insufficiency with varicose veins and swelling. Duplex today shows no DVT or SVT. She does have both deep and superficial reflux. She additionally has an AASV that is incompetent as well as SSV. Based on her duplex she could be candidate for a venous ablation. I discussed this with her however she is not interested in this at this time. I explained that she is okay to proceed with her knee surgery however recommended early ambulation, compression and elevation.  Based on the patient's history and examination, I recommend: daily elevation of 20-30 minutes above level of heart, daily compression stocking use, exercise, weight reduction, refraining from prolonged sitting or standing I discussed with the patient the use of her 20-30 mm thigh high compression stockings and need for 3 month trial of such if she were to consider having ablation procedure. Otherwise I recommended 20-30 mmHg knee high She was not interested in getting measured or purchasing stockings at today's visit. She would like to discuss everything with her husband and she will call to follow up  She can follow up as needed if she has new or concerning symptoms  Graceann Congress, PA-C [facility] Office: [phone]  05/17/2023, 11:38 AM  Clinic MD: Karin Lieu

## 2023-05-19 LAB — COLOGUARD

## 2023-05-31 DIAGNOSIS — D485 Neoplasm of uncertain behavior of skin: Secondary | ICD-10-CM | POA: Diagnosis not present

## 2023-05-31 DIAGNOSIS — X32XXXA Exposure to sunlight, initial encounter: Secondary | ICD-10-CM | POA: Diagnosis not present

## 2023-05-31 DIAGNOSIS — L570 Actinic keratosis: Secondary | ICD-10-CM | POA: Diagnosis not present

## 2023-05-31 DIAGNOSIS — L820 Inflamed seborrheic keratosis: Secondary | ICD-10-CM | POA: Diagnosis not present

## 2023-05-31 DIAGNOSIS — D225 Melanocytic nevi of trunk: Secondary | ICD-10-CM | POA: Diagnosis not present

## 2023-06-03 ENCOUNTER — Other Ambulatory Visit: Payer: Self-pay | Admitting: Hematology and Oncology

## 2023-06-03 DIAGNOSIS — Z170 Estrogen receptor positive status [ER+]: Secondary | ICD-10-CM

## 2023-06-04 ENCOUNTER — Encounter: Payer: Self-pay | Admitting: Hematology and Oncology

## 2023-06-14 ENCOUNTER — Ambulatory Visit (INDEPENDENT_AMBULATORY_CARE_PROVIDER_SITE_OTHER): Payer: Medicare HMO

## 2023-06-14 VITALS — Ht 67.0 in | Wt 185.0 lb

## 2023-06-14 DIAGNOSIS — Z0000 Encounter for general adult medical examination without abnormal findings: Secondary | ICD-10-CM | POA: Diagnosis not present

## 2023-06-14 DIAGNOSIS — Z780 Asymptomatic menopausal state: Secondary | ICD-10-CM

## 2023-06-14 NOTE — Progress Notes (Signed)
Subjective:   Cynthia Chavez is a 67 y.o. female who presents for Medicare Annual (Subsequent) preventive examination.  Visit Complete: Virtual I connected with  Sande Brothers on 06/14/23 by a audio enabled telemedicine application and verified that I am speaking with the correct person using two identifiers.  Patient Location: Home  Provider Location: Home Office  I discussed the limitations of evaluation and management by telemedicine. The patient expressed understanding and agreed to proceed.  Vital Signs: Because this visit was a virtual/telehealth visit, some criteria may be missing or patient reported. Any vitals not documented were not able to be obtained and vitals that have been documented are patient reported.  Patient Medicare AWV questionnaire was completed by the patient on 06/14/2023  ; I have confirmed that all information answered by patient is correct and no changes since this date.  Cardiac Risk Factors include: advanced age (>30men, >73 women);dyslipidemia;hypertension;smoking/ tobacco exposure     Objective:    Today's Vitals   06/14/23 0836  Weight: 185 lb (83.9 kg)  Height: 5\' 7"  (1.702 m)   Body mass index is 28.98 kg/m.     06/14/2023    8:39 AM 06/12/2022   10:38 AM 07/25/2021   12:45 PM 07/25/2021    6:34 AM 05/13/2021   10:49 AM 10/17/2020   11:36 AM 08/11/2019   10:52 AM  Advanced Directives  Does Patient Have a Medical Advance Directive? No No No No No No No  Would patient like information on creating a medical advance directive? Yes (MAU/Ambulatory/Procedural Areas - Information given) No - Patient declined No - Patient declined No - Patient declined No - Patient declined No - Patient declined Yes (MAU/Ambulatory/Procedural Areas - Information given)    Current Medications (verified) Outpatient Encounter Medications as of 06/14/2023  Medication Sig   acetaminophen (TYLENOL) 500 MG tablet Take 1 tablet (500 mg total) by mouth every 6 (six) hours  as needed.   baclofen (LIORESAL) 10 MG tablet Take 1 tablet (10 mg total) by mouth 3 (three) times daily.   betamethasone dipropionate 0.05 % cream Apply topically 2 (two) times daily. As needed for psoriasis   diclofenac (VOLTAREN) 75 MG EC tablet Take 1 tablet (75 mg total) by mouth 2 (two) times daily.   docusate sodium (COLACE) 100 MG capsule Take 1 capsule (100 mg total) by mouth 2 (two) times daily.   esomeprazole (NEXIUM) 40 MG capsule Take 1 capsule (40 mg total) by mouth daily at 12 noon.   fluticasone (FLONASE) 50 MCG/ACT nasal spray Place 2 sprays into both nostrils daily.   furosemide (LASIX) 20 MG tablet Take 1 tablet (20 mg total) by mouth every other day.   guaiFENesin (MUCINEX) 600 MG 12 hr tablet Take 1 tablet (600 mg total) by mouth 2 (two) times daily.   letrozole (FEMARA) 2.5 MG tablet Take 1 tablet by mouth once daily   levothyroxine (SYNTHROID) 150 MCG tablet Take 1 tablet (150 mcg total) by mouth daily before breakfast. TAKE DAILY, EXCEPT FOR SUNDAY. OMIT SUNDAY DOSE   No facility-administered encounter medications on file as of 06/14/2023.    Allergies (verified) Beef-derived products, Ivp dye [iodinated contrast media], Other, Augmentin [amoxicillin-pot clavulanate], Dilaudid [hydromorphone hcl], and Codeine   History: Past Medical History:  Diagnosis Date   Anxiety    Breast cancer (HCC)    Cancer (HCC)    breast cancer   Depression    Edema    bilateral feet and leg swelling   GERD (  gastroesophageal reflux disease)    History of hiatal hernia    History of radiation therapy 02/12/17- 03/26/17   Chest wall and regional nodes, right- 50 Gy in 25 fractions, Chest Wall, boost, right- 10 Gy in 5 fractions.    Hypothyroidism    Malaise    Personal history of chemotherapy    Personal history of radiation therapy    Port catheter in place 09/08/2016   Sprain of shoulder, right 11/23/2020   Thyroid disease    Varicose veins    Past Surgical History:  Procedure  Laterality Date   ABDOMINAL HYSTERECTOMY     AXILLARY LYMPH NODE BIOPSY Right 01/03/2017   Procedure: RIGHT AXILLARY LYMPH NODE SEED GUIDED EXCISIONAL BIOPSY;  Surgeon: Emelia Loron, MD;  Location: [facility];  Service: General;  Laterality: Right;   BREAST SURGERY     CHOLECYSTECTOMY     COLONOSCOPY     ESOPHAGEAL DILATION     MASTECTOMY Right    MASTECTOMY W/ SENTINEL NODE BIOPSY Right 01/03/2017   axillary   MASTECTOMY W/ SENTINEL NODE BIOPSY Right 01/03/2017   Procedure: RIGHT TOTAL MASTECTOMY WITH RIGHT AXILLARY SENTINEL LYMPH NODE BIOPSY;  Surgeon: Emelia Loron, MD;  Location: [facility] OR;  Service: General;  Laterality: Right;   PORTACATH PLACEMENT Left 08/17/2016   Procedure: INSERTION PORT-A-CATH WITH [place];  Surgeon: Emelia Loron, MD;  Location: [facility];  Service: General;  Laterality: Left;   Family History  Problem Relation Age of Onset   COPD Father    Cancer Father    Social History   Socioeconomic History   Marital status: Married    Spouse name: Chrissie Noa   Number of children: 3   Years of education: 11   Highest education level: 11th grade  Occupational History   Occupation: Conservation officer, nature    Comment: [employer] part time  Tobacco Use   Smoking status: Every Day    Current packs/day: 0.50    Average packs/day: 0.5 packs/day for 30.2 years (15.1 ttl pk-yrs)    Types: Cigarettes    Start date: 04/19/1993   Smokeless tobacco: Never   Tobacco comments:    she stopped smoking for a long time, but started back.   Vaping Use   Vaping status: Never Used  Substance and Sexual Activity   Alcohol use: No   Drug use: No   Sexual activity: Not on file  Other Topics Concern   Not on file  Social History Narrative   One level living at home with her husband.   Children live nearby - visits with family once or twice per week.   Active still working part time, working outdoors, gardening, Catering manager.   Social Determinants of Health   Financial Resource  Strain: Low Risk  (06/14/2023)   Overall Financial Resource Strain (CARDIA)    Difficulty of Paying Living Expenses: Not hard at all  Food Insecurity: No Food Insecurity (06/14/2023)   Hunger Vital Sign    Worried About Running Out of Food in the Last Year: Never true    Ran Out of Food in the Last Year: Never true  Transportation Needs: No Transportation Needs (06/14/2023)   PRAPARE - Administrator, Civil Service (Medical): No    Lack of Transportation (Non-Medical): No  Physical Activity: Insufficiently Active (06/14/2023)   Exercise Vital Sign    Days of Exercise per Week: 3 days    Minutes of Exercise per Session: 30 min  Stress: No Stress Concern Present (06/14/2023)  [employer] of Occupational Health - Occupational Stress Questionnaire    Feeling of Stress : Not at all  Social Connections: Moderately Integrated (06/14/2023)   Social Connection and Isolation Panel [NHANES]    Frequency of Communication with Friends and Family: More than three times a week    Frequency of Social Gatherings with Friends and Family: More than three times a week    Attends Religious Services: More than 4 times per year    Active Member of [employer] or Organizations: No    Attends Banker Meetings: Never    Marital Status: Married    Tobacco Counseling Ready to quit: No Counseling given: Not Answered Tobacco comments: she stopped smoking for a long time, but started back.    Clinical Intake:  Pre-visit preparation completed: Yes  Pain : No/denies pain     Nutritional Risks: None Diabetes: No  How often do you need to have someone help you when you read instructions, pamphlets, or other written materials from your doctor or pharmacy?: 1 - Never  Interpreter Needed?: No  Information entered by :: Renie Ora, LPN   Activities of Daily Living    06/14/2023    8:39 AM  In your present state of health, do you have any difficulty performing the following  activities:  Hearing? 0  Vision? 0  Difficulty concentrating or making decisions? 0  Walking or climbing stairs? 0  Dressing or bathing? 0  Doing errands, shopping? 0  Preparing Food and eating ? N  Using the Toilet? N  In the past six months, have you accidently leaked urine? N  Do you have problems with loss of bowel control? N  Managing your Medications? N  Managing your Finances? N  Housekeeping or managing your Housekeeping? N    Patient Care Team: Raliegh Ip, DO as PCP - General (Family Medicine) Janalyn Harder, MD (Inactive) as Consulting Physician (Dermatology)  Indicate any recent Medical Services you may have received from other than [facility] providers in the past year (date may be approximate).     Assessment:   This is a routine wellness examination for [place].  Hearing/Vision screen Vision Screening - Comments:: Wears rx glasses - up to date with routine eye exams with  Dr.Le    Goals Addressed             This Visit's Progress    DIET - EAT MORE FRUITS AND VEGETABLES         Depression Screen    06/14/2023    8:38 AM 04/17/2023   10:28 AM 03/30/2023   10:05 AM 03/23/2023   11:04 AM 02/07/2023    1:53 PM 01/30/2023    8:35 AM 06/12/2022   10:37 AM  PHQ 2/9 Scores  PHQ - 2 Score 0 0 0 0 0 0 0  PHQ- 9 Score 0 0 0 0  0 0    Fall Risk    06/14/2023    8:37 AM 04/17/2023   10:28 AM 03/30/2023   10:05 AM 03/23/2023   11:03 AM 02/07/2023    1:53 PM  Fall Risk   Falls in the past year? 0 1 1 1 1   Number falls in past yr: 0 0 0 0 0  Injury with Fall? 0 1 1 1 1   Risk for fall due to : No Fall Risks Impaired balance/gait Impaired balance/gait Impaired balance/gait History of fall(s)  Follow up Falls prevention discussed Falls evaluation completed Falls evaluation completed Falls evaluation completed  Falls evaluation completed    MEDICARE RISK AT HOME: Medicare Risk at Home Any stairs in or around the home?: No If so, are there any without handrails?:  No Home Chavez of loose throw rugs in walkways, pet beds, electrical cords, etc?: Yes Adequate lighting in your home to reduce risk of falls?: Yes Life alert?: No Use of a cane, walker or w/c?: No Grab bars in the bathroom?: Yes Shower chair or bench in shower?: Yes Elevated toilet seat or a handicapped toilet?: Yes  TIMED UP AND GO:  Was the test performed?  No    Cognitive Function:        06/14/2023    8:40 AM 06/12/2022   10:38 AM 05/13/2021   10:41 AM 08/11/2019   11:26 AM 08/11/2019   10:55 AM  6CIT Screen  What Year? 0 points 0 points 0 points 0 points 0 points  What month? 0 points 0 points 0 points 0 points 0 points  What time? 0 points 0 points 0 points 0 points 0 points  Count back from 20 0 points 0 points 0 points 0 points 0 points  Months in reverse 0 points 0 points 2 points 0 points 0 points  Repeat phrase 0 points 0 points 6 points 6 points 6 points  Total Score 0 points 0 points 8 points 6 points 6 points    Immunizations Immunization History  Administered Date(s) Administered   Influenza Split 05/19/2020   Moderna Sars-Covid-2 Vaccination 09/17/2019, 10/15/2019, 04/16/2020    TDAP status: Due, Education has been provided regarding the importance of this vaccine. Advised may receive this vaccine at local pharmacy or Health Dept. Aware to provide a copy of the vaccination record if obtained from local pharmacy or Health Dept. Verbalized acceptance and understanding.  Flu Vaccine status: Due, Education has been provided regarding the importance of this vaccine. Advised may receive this vaccine at local pharmacy or Health Dept. Aware to provide a copy of the vaccination record if obtained from local pharmacy or Health Dept. Verbalized acceptance and understanding.  Pneumococcal vaccine status: Due, Education has been provided regarding the importance of this vaccine. Advised may receive this vaccine at local pharmacy or Health Dept. Aware to provide a copy of the  vaccination record if obtained from local pharmacy or Health Dept. Verbalized acceptance and understanding.  Covid-19 vaccine status: Completed vaccines  Qualifies for Shingles Vaccine? Yes   Zostavax completed No   Shingrix Completed?: No.    Education has been provided regarding the importance of this vaccine. Patient has been advised to call insurance company to determine out of pocket expense if they have not yet received this vaccine. Advised may also receive vaccine at local pharmacy or Health Dept. Verbalized acceptance and understanding.  Screening Tests Health Maintenance  Topic Date Due   Pneumonia Vaccine 68+ Years old (1 of 2 - PCV) Never done   DTaP/Tdap/Td (1 - Tdap) Never done   Zoster Vaccines- Shingrix (1 of 2) Never done   DEXA SCAN  Never done   COVID-19 Vaccine (4 - 2023-24 season) 04/08/2023   INFLUENZA VACCINE  11/05/2023 (Originally 03/08/2023)   Fecal DNA (Cologuard)  06/13/2024 (Originally 08/21/1998)   MAMMOGRAM  09/09/2023   Medicare Annual Wellness (AWV)  06/13/2024   Hepatitis C Screening  Completed   HPV VACCINES  Aged Out    Health Maintenance  Health Maintenance Due  Topic Date Due   Pneumonia Vaccine 58+ Years old (1 of 2 - PCV) Never  done   DTaP/Tdap/Td (1 - Tdap) Never done   Zoster Vaccines- Shingrix (1 of 2) Never done   DEXA SCAN  Never done   COVID-19 Vaccine (4 - 2023-24 season) 04/08/2023    Colorectal cancer screening: Referral to GI placed declined . Pt aware the office will call re: appt.  Mammogram status: Completed 09/08/2022. Repeat every year  Bone Density status: Ordered 06/14/2023. Pt provided with contact info and advised to call to schedule appt.  Lung Cancer Screening: (Low Dose CT Chest recommended if Age 70-80 years, 20 pack-year currently smoking OR have quit w/in 15years.) does not qualify.   Lung Cancer Screening Referral: n/a  Additional Screening:  Hepatitis C Screening: does not qualify; Completed  05/01/2017  Vision Screening: Recommended annual ophthalmology exams for early detection of glaucoma and other disorders of the eye. Is the patient up to date with their annual eye exam?  Yes  Who is the provider or what is the name of the office in which the patient attends annual eye exams? Dr.Le  If pt is not established with a provider, would they like to be referred to a provider to establish care? No .   Dental Screening: Recommended annual dental exams for proper oral hygiene  Community Resource Referral / Chronic Care Management: CRR required this visit?  No   CCM required this visit?  No     Plan:     I have personally reviewed and noted the following in the patient's chart:   Medical and social history Use of alcohol, tobacco or illicit drugs  Current medications and supplements including opioid prescriptions. Patient is not currently taking opioid prescriptions. Functional ability and status Nutritional status Physical activity Advanced directives List of other physicians Hospitalizations, surgeries, and ER visits in previous 12 months Vitals Screenings to include cognitive, depression, and falls Referrals and appointments  In addition, I have reviewed and discussed with patient certain preventive protocols, quality metrics, and best practice recommendations. A written personalized care plan for preventive services as well as general preventive health recommendations were provided to patient.     Lorrene Reid, LPN   16/08/958   After Visit Summary: (MyChart) Due to this being a telephonic visit, the after visit summary with patients personalized plan was offered to patient via MyChart   Nurse Notes: Due TDAP/Pneumonia Vaccine

## 2023-06-14 NOTE — Patient Instructions (Signed)
Cynthia Chavez , Thank you for taking time to come for your Medicare Wellness Visit. I appreciate your ongoing commitment to your health goals. Please review the following plan we discussed and let me know if I can assist you in the future.   Referrals/Orders/Follow-Ups/Clinician Recommendations: Aim for 30 minutes of exercise or brisk walking, 6-8 glasses of water, and 5 servings of fruits and vegetables each day.   This is a list of the screening recommended for you and due dates:  Health Maintenance  Topic Date Due   Pneumonia Vaccine (1 of 2 - PCV) Never done   DTaP/Tdap/Td vaccine (1 - Tdap) Never done   Zoster (Shingles) Vaccine (1 of 2) Never done   DEXA scan (bone density measurement)  Never done   COVID-19 Vaccine (4 - 2023-24 season) 04/08/2023   Flu Shot  11/05/2023*   Cologuard (Stool DNA test)  06/13/2024*   Mammogram  09/09/2023   Medicare Annual Wellness Visit  06/13/2024   Hepatitis C Screening  Completed   HPV Vaccine  Aged Out  *Topic was postponed. The date shown is not the original due date.    Advanced directives: (Copy Requested) Please bring a copy of your health care power of attorney and living will to the office to be added to your chart at your convenience.  Next Medicare Annual Wellness Visit scheduled for next year: Yes  Insert Preventive Care attachment Insert FALL PREVENTION attachment if needed

## 2023-06-26 ENCOUNTER — Ambulatory Visit: Payer: Medicare HMO | Admitting: Orthopedic Surgery

## 2023-06-26 ENCOUNTER — Encounter: Payer: Self-pay | Admitting: Orthopedic Surgery

## 2023-06-26 VITALS — BP 143/86 | HR 80 | Ht 67.0 in | Wt 186.0 lb

## 2023-06-26 DIAGNOSIS — S83242A Other tear of medial meniscus, current injury, left knee, initial encounter: Secondary | ICD-10-CM | POA: Diagnosis not present

## 2023-06-26 NOTE — Progress Notes (Signed)
New Patient Visit  Assessment: Cynthia Chavez is a 67 y.o. female with the following: 1. Tear of medial meniscus of left knee, current, unspecified tear type, initial encounter  Plan: Cynthia Chavez continues to have pain in the left knee.  MRI demonstrates a tear of the medial meniscus, close to the root of the posterior horn.  This is resulting in some extrusion, as well as some bony edema on MRI.  She is now at a point where her pain is restricting her activities.  She is no longer seeing improvements in her symptoms.  She is interested in surgery.  Procedure was discussed in great detail in clinic today.  I have recommended left knee arthroscopy, with partial meniscectomy versus meniscus root repair.  Final decision will be made at the time of surgery.  She would like to proceed with surgery sometime in early December.  We will confirm clearance, and then finalize a plan and date for surgery.  Risks and benefits of the surgery, including, but not limited to infection, bleeding, persistent pain, need for further surgery, progression of arthritis, stiffness and more severe complications associated with anesthesia were discussed with the patient.  The patient has elected to proceed.   Follow-up: Return for After surgery.  Subjective:  Chief Complaint  Patient presents with   Knee Pain    L knee pain that's been worse since Jul '24. Pt was told to come in when she's ready to consider surgery.     History of Present Illness: Cynthia Chavez is a 67 y.o. female who presents for evaluation of left knee pain.  She has previously been followed by Dr. Hilda Lias.  He obtained an MRI.  He discussed possible surgery with her, but she was not interested in considering surgery, stating that her knee continues to improve.  However, she has not had much improvement in her pain since her last visit.  She continues to have pain in the medial aspect of the left knee.  It is affecting her daily function.  She has  no interest in surgery.   Review of Systems: No fevers or chills No numbness or tingling No chest pain No shortness of breath No bowel or bladder dysfunction No GI distress No headaches   Medical History:  Past Medical History:  Diagnosis Date   Anxiety    Breast cancer (HCC)    Cancer (HCC)    breast cancer   Depression    Edema    bilateral feet and leg swelling   GERD (gastroesophageal reflux disease)    History of hiatal hernia    History of radiation therapy 02/12/17- 03/26/17   Chest wall and regional nodes, right- 50 Gy in 25 fractions, Chest Wall, boost, right- 10 Gy in 5 fractions.    Hypothyroidism    Malaise    Personal history of chemotherapy    Personal history of radiation therapy    Port catheter in place 09/08/2016   Sprain of shoulder, right 11/23/2020   Thyroid disease    Varicose veins     Past Surgical History:  Procedure Laterality Date   ABDOMINAL HYSTERECTOMY     AXILLARY LYMPH NODE BIOPSY Right 01/03/2017   Procedure: RIGHT AXILLARY LYMPH NODE SEED GUIDED EXCISIONAL BIOPSY;  Surgeon: Emelia Loron, MD;  Location: [facility] OR;  Service: General;  Laterality: Right;   BREAST SURGERY     CHOLECYSTECTOMY     COLONOSCOPY     ESOPHAGEAL DILATION     MASTECTOMY Right  MASTECTOMY W/ SENTINEL NODE BIOPSY Right 01/03/2017   axillary   MASTECTOMY W/ SENTINEL NODE BIOPSY Right 01/03/2017   Procedure: RIGHT TOTAL MASTECTOMY WITH RIGHT AXILLARY SENTINEL LYMPH NODE BIOPSY;  Surgeon: Emelia Loron, MD;  Location: [facility];  Service: General;  Laterality: Right;   PORTACATH PLACEMENT Left 08/17/2016   Procedure: INSERTION PORT-A-CATH WITH [place];  Surgeon: Emelia Loron, MD;  Location: [facility];  Service: General;  Laterality: Left;    Family History  Problem Relation Age of Onset   COPD Father    Cancer Father    Social History   Tobacco Use   Smoking status: Some Days    Current packs/day: 0.50    Average packs/day: 0.5 packs/day  for 30.2 years (15.1 ttl pk-yrs)    Types: Cigarettes    Start date: 04/19/1993   Smokeless tobacco: Never   Tobacco comments:    she stopped smoking for a long time, but started back.   Vaping Use   Vaping status: Never Used  Substance Use Topics   Alcohol use: No   Drug use: No    Allergies  Allergen Reactions   Beef-Derived Products Hives, Shortness Of Breath, Swelling and Rash   Ivp Dye [Iodinated Contrast Media] Other (See Comments)    Chest pain   Other Shortness Of Breath, Swelling and Other (See Comments)    Versed or Fentanyl when used for endoscopy caused throat and tongue swelling   Augmentin [Amoxicillin-Pot Clavulanate]    Dilaudid [Hydromorphone Hcl]     PT had CP and felt shaky. Didn't like the feeling, but sx lessened w/in 10 min of IV administration   Codeine Other (See Comments)    spasms    Current Meds  Medication Sig   letrozole (FEMARA) 2.5 MG tablet Take 1 tablet by mouth once daily   levothyroxine (SYNTHROID) 150 MCG tablet Take 1 tablet (150 mcg total) by mouth daily before breakfast. TAKE DAILY, EXCEPT FOR SUNDAY. OMIT SUNDAY DOSE    Objective: BP (!) 143/86   Pulse 80   Ht 5\' 7"  (1.702 m)   Wt 186 lb (84.4 kg)   BMI 29.13 kg/m   Physical Exam:  General: Alert and oriented. Gait: Left sided antalgic gait.  Evaluation left knee demonstrates no deformity.  Mild effusion is appreciated.  Tenderness to palpation along the medial joint line.  Pain with hyperflexion.  She has some tenderness to palpation over the medial plateau.  She is able to achieve full extension.  Negative Lachman.  No increased laxity varus or valgus stress.  IMAGING: I personally reviewed images previously obtained in clinic  Left knee MRI  IMPRESSION: 1. Radial tear involving the root of the posterior horn of the medial meniscus with mild peripheral extrusion of the meniscus from the joint space. 2. Prominent marrow edema throughout the medial tibial plateau  with probable underlying subchondral insufficiency fracture. Possible additional acute osseous injury anteriorly in the lateral tibial plateau. 3. Moderate patellofemoral and medial compartment degenerative changes. 4. Intact lateral meniscus, cruciate and collateral ligaments.    New Medications:  No orders of the defined types were placed in this encounter.     Oliver Barre, MD  06/26/2023 11:04 PM

## 2023-06-26 NOTE — Patient Instructions (Signed)
Your surgery will be at [facility], scheduled with Dr Thane Edu   The hospital will contact you with a preoperative appointment to discuss Anesthesia. The phone number is [phone]   Please bring your medications with you for the appointment.  They will tell you the arrival time and medication instructions when you have your preoperative evaluation.  Do not wear nail polish the day of your surgery and if you take Phentermine you need to stop this medication ONE WEEK prior to your surgery.    If you take an blood thinning medication, we will need to stop this prior to surgery.  Typically, we stop this medicine at least 5 days prior to surgery.  We will need to confirm this with the doctor who prescribes this medication.  If you are taking medications or an injection for diabetes, or for weight management, this medicine will need to be stopped at least 7 days prior to surgery.     Surgery will be scheduled once medical clearance has been confirmed.

## 2023-07-11 ENCOUNTER — Ambulatory Visit (INDEPENDENT_AMBULATORY_CARE_PROVIDER_SITE_OTHER): Payer: Medicare HMO | Admitting: Family Medicine

## 2023-07-11 ENCOUNTER — Other Ambulatory Visit: Payer: Self-pay | Admitting: *Deleted

## 2023-07-11 ENCOUNTER — Encounter: Payer: Self-pay | Admitting: Family Medicine

## 2023-07-11 VITALS — BP 132/72 | HR 72 | Ht 67.0 in | Wt 184.0 lb

## 2023-07-11 DIAGNOSIS — Z01818 Encounter for other preprocedural examination: Secondary | ICD-10-CM | POA: Diagnosis not present

## 2023-07-11 DIAGNOSIS — I517 Cardiomegaly: Secondary | ICD-10-CM | POA: Diagnosis not present

## 2023-07-11 DIAGNOSIS — E039 Hypothyroidism, unspecified: Secondary | ICD-10-CM

## 2023-07-11 NOTE — Addendum Note (Signed)
Addended by: Neale Burly on: 07/11/2023 05:31 PM   Modules accepted: Level of Service

## 2023-07-11 NOTE — Progress Notes (Signed)
Subjective:  Patient ID: Cynthia Chavez, female    DOB: 1956-04-03, 67 y.o.   MRN: [id number]  Patient Care Team: Raliegh Ip, DO as PCP - General (Family Medicine) Janalyn Harder, MD (Inactive) as Consulting Physician (Dermatology)   Chief Complaint:  surgical clearance   HPI: Cynthia Chavez is a 67 y.o. female presenting on 07/11/2023 for surgical clearance 1) High Risk Cardiac Conditions  1) Recent MI - No.  2) Decompensated Heart Failure - No.  3) Unstable angina - No.  4) Symptomatic arrythmia - No.  5) Sx Valvular Disease - No.  2) Intermediate Risk Factors - DM, CKD, CVA, CHF, CAD - Yes.   History of Bilateral Pulmonary Embolism and Right leg DVT 12/192022  2) Functional Status - > 4 mets (Walk, run, climb stairs) Yes.  Kateri Mc Activity Status Index:  [facility] Activity Status Index (DASI) from [email or web address]  on 07/11/2023 ** All calculations should be rechecked by clinician prior to use **  RESULT SUMMARY: 42.7 points The higher the score (maximum 58.2), the higher the functional status.  7.99 METs  INPUTS: Take care of self --> 2.75 = Yes Walk indoors --> 1.75 = Yes Walk 1&ndash;2 blocks on level ground --> 2.75 = Yes Climb a flight of stairs or walk up a hill --> 5.5 = Yes Run a short distance --> 0 = No Do light work around the house --> 2.7 = Yes Do moderate work around the house --> 3.5 = Yes Do heavy work around the house --> 8 = Yes Do yardwork --> 4.5 = Yes Have sexual relations --> 5.25 = Yes Participate in moderate recreational activities --> 6 = Yes Participate in strenuous sports --> 0 = No  Revised Cardiac Risk Index for Pre-Operative Risk from [email or web address]  on 07/11/2023 ** All calculations should be rechecked by clinician prior to use **  RESULT SUMMARY: 0 points RCRI Score  3.9 % Risk of major cardiac event   INPUTS: High-risk surgery --> 0 = No History of ischemic heart disease --> 0 = No History of congestive heart failure --> 0 =  No History of cerebrovascular disease --> 0 = No Pre-operative treatment with insulin --> 0 = No Pre-operative creatinine >2 mg/dL / 409.8 mol/L --> 0 = No  3) Surgery Specific Risk - High  (Emergency, Vascular, Intra-abdominal, Extensive ops)          Intermediate (Carotid, Head and Neck, Orthopaedic )          Low (Endoscopic, Cataract, Breast )  4) Further Noninvasive evaluation -   1) EKG - Yes.     1) Hx of CVA, CAD, DM, CKD History of DVT and bilateral PE   2) Echo - Yes.   Completed Echo 07/25/2021 during bilateral PE and DVT. Noted to have abnormalities on exam.    1) Worsening dyspnea   3) Stress Testing - Active Cardiac Disease - No.  5) Need for medical therapy - Beta Blocker, Statins indicated ? No.  Relevant past medical, surgical, family, and social history reviewed and updated as indicated.  Allergies and medications reviewed and updated. Data reviewed: Chart in Epic.   Past Medical History:  Diagnosis Date   Anxiety    Breast cancer (HCC)    Cancer (HCC)    breast cancer   Depression    Edema    bilateral feet and leg swelling   GERD (gastroesophageal reflux disease)    History of hiatal hernia  History of radiation therapy 02/12/17- 03/26/17   Chest wall and regional nodes, right- 50 Gy in 25 fractions, Chest Wall, boost, right- 10 Gy in 5 fractions.    Hypothyroidism    Malaise    Personal history of chemotherapy    Personal history of radiation therapy    Port catheter in place 09/08/2016   Sprain of shoulder, right 11/23/2020   Thyroid disease    Varicose veins     Past Surgical History:  Procedure Laterality Date   ABDOMINAL HYSTERECTOMY     AXILLARY LYMPH NODE BIOPSY Right 01/03/2017   Procedure: RIGHT AXILLARY LYMPH NODE SEED GUIDED EXCISIONAL BIOPSY;  Surgeon: Emelia Loron, MD;  Location: [facility] OR;  Service: General;  Laterality: Right;   BREAST SURGERY     CHOLECYSTECTOMY     COLONOSCOPY     ESOPHAGEAL DILATION     MASTECTOMY Right     MASTECTOMY W/ SENTINEL NODE BIOPSY Right 01/03/2017   axillary   MASTECTOMY W/ SENTINEL NODE BIOPSY Right 01/03/2017   Procedure: RIGHT TOTAL MASTECTOMY WITH RIGHT AXILLARY SENTINEL LYMPH NODE BIOPSY;  Surgeon: Emelia Loron, MD;  Location: [facility] OR;  Service: General;  Laterality: Right;   PORTACATH PLACEMENT Left 08/17/2016   Procedure: INSERTION PORT-A-CATH WITH [place];  Surgeon: Emelia Loron, MD;  Location: [facility];  Service: General;  Laterality: Left;    Social History   Socioeconomic History   Marital status: Married    Spouse name: Chrissie Noa   Number of children: 3   Years of education: 11   Highest education level: 11th grade  Occupational History   Occupation: Conservation officer, nature    Comment: [employer] part time  Tobacco Use   Smoking status: Some Days    Current packs/day: 0.50    Average packs/day: 0.5 packs/day for 30.2 years (15.1 ttl pk-yrs)    Types: Cigarettes    Start date: 04/19/1993   Smokeless tobacco: Never   Tobacco comments:    she stopped smoking for a long time, but started back.   Vaping Use   Vaping status: Never Used  Substance and Sexual Activity   Alcohol use: No   Drug use: No   Sexual activity: Not on file  Other Topics Concern   Not on file  Social History Narrative   One level living at home with her husband.   Children live nearby - visits with family once or twice per week.   Active still working part time, working outdoors, gardening, Catering manager.   Social Determinants of Health   Financial Resource Strain: Low Risk  (06/14/2023)   Overall Financial Resource Strain (CARDIA)    Difficulty of Paying Living Expenses: Not hard at all  Food Insecurity: No Food Insecurity (06/14/2023)   Hunger Vital Sign    Worried About Running Out of Food in the Last Year: Never true    Ran Out of Food in the Last Year: Never true  Transportation Needs: No Transportation Needs (06/14/2023)   PRAPARE - Administrator, Civil Service  (Medical): No    Lack of Transportation (Non-Medical): No  Physical Activity: Insufficiently Active (06/14/2023)   Exercise Vital Sign    Days of Exercise per Week: 3 days    Minutes of Exercise per Session: 30 min  Stress: No Stress Concern Present (06/14/2023)   [employer] of Occupational Health - Occupational Stress Questionnaire    Feeling of Stress : Not at all  Social Connections: Moderately Integrated (06/14/2023)   Social Connection  and Isolation Panel [NHANES]    Frequency of Communication with Friends and Family: More than three times a week    Frequency of Social Gatherings with Friends and Family: More than three times a week    Attends Religious Services: More than 4 times per year    Active Member of [employer] or Organizations: No    Attends Banker Meetings: Never    Marital Status: Married  Catering manager Violence: Not At Risk (06/14/2023)   Humiliation, Afraid, Rape, and Kick questionnaire    Fear of Current or Ex-Partner: No    Emotionally Abused: No    Physically Abused: No    Sexually Abused: No    Outpatient Encounter Medications as of 07/11/2023  Medication Sig   acetaminophen (TYLENOL) 500 MG tablet Take 1 tablet (500 mg total) by mouth every 6 (six) hours as needed. (Patient not taking: Reported on 06/26/2023)   baclofen (LIORESAL) 10 MG tablet Take 1 tablet (10 mg total) by mouth 3 (three) times daily. (Patient not taking: Reported on 06/26/2023)   betamethasone dipropionate 0.05 % cream Apply topically 2 (two) times daily. As needed for psoriasis (Patient not taking: Reported on 06/26/2023)   diclofenac (VOLTAREN) 75 MG EC tablet Take 1 tablet (75 mg total) by mouth 2 (two) times daily. (Patient not taking: Reported on 06/26/2023)   docusate sodium (COLACE) 100 MG capsule Take 1 capsule (100 mg total) by mouth 2 (two) times daily. (Patient not taking: Reported on 06/26/2023)   esomeprazole (NEXIUM) 40 MG capsule Take 1 capsule (40 mg total) by  mouth daily at 12 noon. (Patient not taking: Reported on 06/26/2023)   fluticasone (FLONASE) 50 MCG/ACT nasal spray Place 2 sprays into both nostrils daily. (Patient not taking: Reported on 06/26/2023)   furosemide (LASIX) 20 MG tablet Take 1 tablet (20 mg total) by mouth every other day. (Patient not taking: Reported on 06/26/2023)   guaiFENesin (MUCINEX) 600 MG 12 hr tablet Take 1 tablet (600 mg total) by mouth 2 (two) times daily. (Patient not taking: Reported on 06/26/2023)   letrozole (FEMARA) 2.5 MG tablet Take 1 tablet by mouth once daily   levothyroxine (SYNTHROID) 150 MCG tablet Take 1 tablet (150 mcg total) by mouth daily before breakfast. TAKE DAILY, EXCEPT FOR SUNDAY. OMIT SUNDAY DOSE   No facility-administered encounter medications on file as of 07/11/2023.    Allergies  Allergen Reactions   Beef-Derived Products Hives, Shortness Of Breath, Swelling and Rash   Ivp Dye [Iodinated Contrast Media] Other (See Comments)    Chest pain   Other Shortness Of Breath, Swelling and Other (See Comments)    Versed or Fentanyl when used for endoscopy caused throat and tongue swelling   Augmentin [Amoxicillin-Pot Clavulanate]    Dilaudid [Hydromorphone Hcl]     PT had CP and felt shaky. Didn't like the feeling, but sx lessened w/in 10 min of IV administration   Codeine Other (See Comments)    spasms    Review of Systems As per HPI  Objective:  BP 132/72   Pulse 72   Ht 5\' 7"  (1.702 m)   Wt 184 lb (83.5 kg)   SpO2 99%   BMI 28.82 kg/m    Wt Readings from Last 3 Encounters:  07/11/23 184 lb (83.5 kg)  06/26/23 186 lb (84.4 kg)  06/14/23 185 lb (83.9 kg)   Physical Exam Constitutional:      General: She is awake. She is not in acute distress.    Appearance:  Normal appearance. She is well-developed and well-groomed. She is not ill-appearing, toxic-appearing or diaphoretic.  HENT:     Head:     Comments: Hoarse voice  Cardiovascular:     Rate and Rhythm: Normal rate and regular  rhythm.     Pulses: Normal pulses.          Radial pulses are 2+ on the right side and 2+ on the left side.       Posterior tibial pulses are 2+ on the right side and 2+ on the left side.     Heart sounds: Murmur heard.     Systolic murmur is present with a grade of 1/6.     No gallop.     Comments: Hyperpigmentation of bilateral lower extremities with varicose veins present  Pulmonary:     Effort: Pulmonary effort is normal. No respiratory distress.     Breath sounds: Normal breath sounds. No stridor. No wheezing, rhonchi or rales.  Musculoskeletal:     Cervical back: Full passive range of motion without pain and neck supple.     Right lower leg: 1+ Edema present.     Left lower leg: 1+ Edema present.  Skin:    General: Skin is warm.     Capillary Refill: Capillary refill takes less than 2 seconds.  Neurological:     General: No focal deficit present.     Mental Status: She is alert, oriented to person, place, and time and easily aroused. Mental status is at baseline.     GCS: GCS eye subscore is 4. GCS verbal subscore is 5. GCS motor subscore is 6.     Motor: No weakness.  Psychiatric:        Attention and Perception: Attention and perception normal.        Mood and Affect: Mood and affect normal.        Speech: Speech normal.        Behavior: Behavior normal. Behavior is cooperative.        Thought Content: Thought content normal. Thought content does not include homicidal or suicidal ideation. Thought content does not include homicidal or suicidal plan.        Cognition and Memory: Cognition and memory normal.        Judgment: Judgment normal.     Results for orders placed or performed in visit on 05/11/23  Cologuard  Result Value Ref Range   COLOGUARD Sample Could Not Be Processed 9 N/A       07/11/2023   10:38 AM 06/14/2023    8:38 AM 04/17/2023   10:28 AM 03/30/2023   10:05 AM 03/23/2023   11:04 AM  Depression screen PHQ 2/9  Decreased Interest 0 0 0 0 0  Down,  Depressed, Hopeless 0 0 0 0 0  PHQ - 2 Score 0 0 0 0 0  Altered sleeping 0 0 0 0 0  Tired, decreased energy 0 0 0 0 0  Change in appetite 0 0 0 0 0  Feeling bad or failure about yourself  0 0 0 0 0  Trouble concentrating 0 0 0 0 0  Moving slowly or fidgety/restless 0 0 0 0 0  Suicidal thoughts 0 0 0 0 0  PHQ-9 Score 0 0 0 0 0  Difficult doing work/chores Not difficult at all Not difficult at all Not difficult at all Not difficult at all Not difficult at all       07/11/2023   10:38 AM 04/17/2023  10:28 AM 03/30/2023   10:05 AM 03/23/2023   11:04 AM  GAD 7 : Generalized Anxiety Score  Nervous, Anxious, on Edge 0 0 0 0  Control/stop worrying 0 0 0 0  Worry too much - different things 0 0 0 0  Trouble relaxing 0 0 0 0  Restless 0 0 0 0  Easily annoyed or irritable 0 0 0 0  Afraid - awful might happen 0 0 0 0  Total GAD 7 Score 0 0 0 0  Anxiety Difficulty Not difficult at all Not difficult at all Not difficult at all Not difficult at all   Pertinent labs & imaging results that were available during my care of the patient were reviewed by me and considered in my medical decision making.  Assessment & Plan:  Vaelyn was seen today for surgical clearance.  Diagnoses and all orders for this visit:  Preoperative examination I have independently evaluated patient, who is intermediate risk for a intermediate risk surgery.  There are/ are not are modifiable risk factors: smoking.  Patient's RCRI/NSQIP calculation for MACE is: 3.9 %.    CXR (if asx/healthy/no resp issues don't need) - not indicated  EKG (?h/o MI, CAD, etc; usually don't need for low risk sx) - completed due to history of PE, DVT, and abnormal EKG. Reviewed and did not see any significant changes on exam.  PFTs (significant cardiopulm hx? OSA/OHS) - not indicated  Echo (get if CHF and has not had in >1 year OR if worse HF symptoms) - order placed as below as patient has history of abnormal echocardiogram.  Review meds: NO  ACE-I/ARB day of surgery. OK to do BB or statin day of esp if vascular sx) -     EKG 12-Lead -     ECHOCARDIOGRAM COMPLETE; Future  Right ventricular enlargement Reviewed Echocardiogram from 07/25/21. Due to abnormalities, believe that patient should repeat imaging prior to completing surgery. Will collaborate with PCP on recommendation.  -     ECHOCARDIOGRAM COMPLETE; Future  Acquired hypothyroidism Patient previously had abnormal labs and would like them rechecked today. Labs as below. Will communicate results to patient once available. Will await results to determine next steps.  -     TSH + free T4  Continue all other maintenance medications.  Follow up plan: Return if symptoms worsen or fail to improve.   Continue healthy lifestyle choices, including diet (rich in fruits, vegetables, and lean proteins, and low in salt and simple carbohydrates) and exercise (at least 30 minutes of moderate physical activity daily).  Written and verbal instructions provided   The above assessment and management plan was discussed with the patient. The patient verbalized understanding of and has agreed to the management plan. Patient is aware to call the clinic if they develop any new symptoms or if symptoms persist or worsen. Patient is aware when to return to the clinic for a follow-up visit. Patient educated on when it is appropriate to go to the emergency department.   Neale Burly, DNP-FNP [facility] [address] [place], [place] [address] [phone]

## 2023-07-12 LAB — TSH+FREE T4
Free T4: 1.52 ng/dL (ref 0.82–1.77)
TSH: 2.22 u[IU]/mL (ref 0.450–4.500)

## 2023-07-16 ENCOUNTER — Telehealth: Payer: Self-pay | Admitting: Orthopedic Surgery

## 2023-07-16 NOTE — Telephone Encounter (Signed)
Dr. Dallas Schimke pt - spoke w/the pt, she stated that she has seen her PCP for surgery clearance, she declined some of what they wanted to do.  She wants to know if she's good to have her surgery now, she wants to schedule.

## 2023-07-17 ENCOUNTER — Other Ambulatory Visit (HOSPITAL_COMMUNITY): Payer: Medicare HMO

## 2023-07-17 NOTE — Telephone Encounter (Signed)
Spoke with pt who states she saw an NP for surgery clearance and after having an EKG they recommend she gets an Echo. Pt states she doesn't feel like she needs that and it's not in the budget. Please advise.

## 2023-07-18 NOTE — Telephone Encounter (Signed)
Gave information to pt, and she verbalized understanding.

## 2023-07-23 ENCOUNTER — Telehealth: Payer: Self-pay | Admitting: Orthopedic Surgery

## 2023-07-23 NOTE — Telephone Encounter (Signed)
Dr. Dallas Schimke pt [phone] - stated she got the ok from her physician for surgery and she wants to get that scheduled

## 2023-07-23 NOTE — Progress Notes (Signed)
Patient aware.

## 2023-08-09 ENCOUNTER — Other Ambulatory Visit: Payer: Self-pay | Admitting: Hematology and Oncology

## 2023-08-09 DIAGNOSIS — Z1231 Encounter for screening mammogram for malignant neoplasm of breast: Secondary | ICD-10-CM

## 2023-08-13 ENCOUNTER — Telehealth: Payer: Self-pay | Admitting: Orthopedic Surgery

## 2023-08-13 NOTE — Telephone Encounter (Signed)
 Dr. Dallas Schimke pt - pt lvm on 08/10/23 at 3:27pm stating she has new insurance and it needs to be approved before she has surgery - [phone]

## 2023-08-17 ENCOUNTER — Other Ambulatory Visit: Payer: Self-pay | Admitting: Family Medicine

## 2023-08-17 ENCOUNTER — Telehealth: Payer: Self-pay | Admitting: Orthopedic Surgery

## 2023-08-17 ENCOUNTER — Encounter: Payer: Self-pay | Admitting: Hematology and Oncology

## 2023-08-17 DIAGNOSIS — E039 Hypothyroidism, unspecified: Secondary | ICD-10-CM

## 2023-08-17 NOTE — Telephone Encounter (Signed)
 Documentation from [facility] that patient was clearance for surgery.

## 2023-08-17 NOTE — Telephone Encounter (Signed)
 New insurance information received. The patient is scheduled for surgery on 09/03/23.

## 2023-08-17 NOTE — Telephone Encounter (Signed)
-----   Message from Oneil DELENA Horde sent at 07/20/2023  5:35 PM EST ----- Regarding: FW: surgical clearance  FYI  Thanks for your help  Oneil ----- Message ----- From: Cathlene Marry Lenis, FNP Sent: 07/20/2023   5:11 PM EST To: Oneil DELENA Horde, MD Subject: surgical clearance                             Hi Dr. Horde,   I evaluated patient for surgical clearance while her PCP was out on vacation. I ordered an Echo to be on the safe side due to her previous abnormal echo. When her PCP returned from vacation, she reviewed the notes and did not think that it was warranted. I wanted to let you know that we will cancer the order for the echo and her surgical clearance eval was completed!  Thanks!  Marry

## 2023-08-21 ENCOUNTER — Encounter: Payer: Self-pay | Admitting: Hematology and Oncology

## 2023-08-28 NOTE — Patient Instructions (Signed)
Cynthia Chavez  08/28/2023     @PREFPERIOPPHARMACY @   Your procedure is scheduled on  09/03/2023.   Report to [facility] at  0730  A.M.   Call this number if you have problems the morning of surgery:  [phone]  If you experience any cold or flu symptoms such as cough, fever, chills, shortness of breath, etc. between now and your scheduled surgery, please notify us at the above number.   Remember:  Do not eat after midnight.   You may drink clear liquids until 0530 am on 09/03/2023.    Clear liquids allowed are:                    Water, Juice (No red color; non-citric and without pulp; diabetics please choose diet or no sugar options), Carbonated beverages (diabetics please choose diet or no sugar options), Clear Tea (No creamer, milk, or cream, including half & half and powdered creamer), Black Coffee Only (No creamer, milk or cream, including half & half and powdered creamer), and Clear Sports drink (No red color; diabetics please choose diet or no sugar options)    Take these medicines the morning of surgery with A SIP OF WATER            esomeprazole, letrozole, levothyroxine.     Do not wear jewelry, make-up or nail polish, including gel polish,  artificial nails, or any other type of covering on natural nails (fingers and  toes).  Do not wear lotions, powders, or perfumes, or deodorant.  Do not shave 48 hours prior to surgery.  Men may shave face and neck.  Do not bring valuables to the hospital.  [facility] is not responsible for any belongings or valuables.  Contacts, dentures or bridgework may not be worn into surgery.  Leave your suitcase in the car.  After surgery it may be brought to your room.  For patients admitted to the hospital, discharge time will be determined by your treatment team.  Patients discharged the day of surgery will not be allowed to drive home and must have someone with them for 24 hours.    Special instructions:   DO NOT  smoke tobacco or vape for 24 hours before your procedure.  Please read over the following fact sheets that you were given. Coughing and Deep Breathing, Surgical Site Infection Prevention, Anesthesia Post-op Instructions, and Care and Recovery After Surgery      Arthroscopic Knee Ligament Repair, Care After After your knee surgery, it's common to have soreness, swelling, or pain. You may also have some fluid coming from the cuts that were made on your knee (incisions). Follow these instructions at home: Medicines Take over-the-counter and prescription medicines only as told by your health care provider. Ask your provider if the medicine prescribed to you: Requires you to avoid driving or using machinery. Can cause constipation. You may need to take these actions to prevent or treat constipation: Drink enough fluid to keep your pee (urine) pale yellow. Take over-the-counter or prescription medicines. Eat foods that are high in fiber, such as beans, whole grains, and fresh fruits and vegetables. Limit foods that are high in fat and processed sugars, such as fried or sweet foods. If you have a brace: Wear the brace as told by your provider. Remove it only as told by your provider. Check the skin around the brace every day. Tell your provider about any concerns. Loosen the brace  if your toes tingle, become numb, or turn cold and blue. Keep it clean and dry. Ask your provider when it's safe to drive if you have a brace on your knee. Bathing Do not take baths, swim, or use a hot tub until your provider approves. Keep your bandage dry until your provider says that it can be removed. If the brace is not waterproof: Do not let it get wet. Cover it with a watertight covering when you take a bath or shower. Incision care  Follow instructions from your provider about how to take care of your incisions. Make sure you: Wash your hands with soap and water for at least 20 seconds before and after  you change your dressing. If soap and water are not available, use hand sanitizer. Change your dressing as told by your provider. Leave stitches, skin glue, or tape strips in place. These skin closures may need to stay in place for 2 weeks or longer. If tape strip edges start to loosen and curl up, you may trim the loose edges. Do not remove tape strips completely unless your provider tells you to do that. Check your incision areas every day for signs of infection. Check for: Redness. More swelling or pain. Blood or more fluid. Warmth. Pus or a bad smell. Managing pain, stiffness, and swelling  If told, put ice on the affected area. If you have a removable brace, remove it as told by your provider. Put ice in a plastic bag. Place a towel between your skin and the bag. Leave the ice on for 20 minutes, 2-3 times a day. If your skin turns bright red, remove the ice right away to prevent skin damage. The risk of damage is higher if you can't feel pain, heat, or cold. Move your toes often to reduce stiffness and swelling. Raise the injured area above the level of your heart while you are sitting or lying down. Activity Do not use your knee to walk until your provider says that you can. Use crutches or other devices as told by your provider. You will work with a physical therapist to do exercises. This will make your knee move better and get stronger. Your provider will tell you: When you may do exercises that move parts of your body. These are called motion exercises. When you may start to ride a stationary bike and other gentle exercises. When you may start to do harder exercises, such as jogging. You may have to avoid lifting. Ask your provider how much you can safely lift. Return to your normal activities as told by your provider. Ask your provider what activities are safe for you. General instructions Do not use any products that contain nicotine or tobacco. These products include  cigarettes, chewing tobacco, and vaping devices, such as e-cigarettes. If you need help quitting, ask your provider. Wear compression stockings as told by your provider. These stockings help to prevent blood clots and reduce swelling in your leg. Keep all follow-up visits. Your provider will check that your knee is healing well. Contact a health care provider if: You have signs of infection in the cuts that were made on your knee. Signs include swelling, redness, warmth, pus, or a bad smell. You have a fever or chills. You have pain that does not get better with medicine. The cuts in your knee open up. Get help right away if: You have trouble breathing. You have chest pain. You have worse pain or swelling in your calf or at the  back of your knee. Your knee, foot, ankle, or toes tingle or become numb. Your foot or toes look darker than normal or are cooler than normal. These symptoms may be an emergency. Get help right away. Call 911. Do not wait to see if the symptoms will go away. Do not drive yourself to the hospital. This information is not intended to replace advice given to you by your health care provider. Make sure you discuss any questions you have with your health care provider. Document Revised: 10/18/2022 Document Reviewed: 10/18/2022 Elsevier Patient Education  2024 [employer].General Anesthesia, Adult, Care After The following information offers guidance on how to care for yourself after your procedure. Your health care provider may also give you more specific instructions. If you have problems or questions, contact your health care provider. What can I expect after the procedure? After the procedure, it is common for people to: Have pain or discomfort at the IV site. Have nausea or vomiting. Have a sore throat or hoarseness. Have trouble concentrating. Feel cold or chills. Feel weak, sleepy, or tired (fatigue). Have soreness and body aches. These can affect parts of the  body that were not involved in surgery. Follow these instructions at home: For the time period you were told by your health care provider:  Rest. Do not participate in activities where you could fall or become injured. Do not drive or use machinery. Do not drink alcohol. Do not take sleeping pills or medicines that cause drowsiness. Do not make important decisions or sign legal documents. Do not take care of children on your own. General instructions Drink enough fluid to keep your urine pale yellow. If you have sleep apnea, surgery and certain medicines can increase your risk for breathing problems. Follow instructions from your health care provider about wearing your sleep device: Anytime you are sleeping, including during daytime naps. While taking prescription pain medicines, sleeping medicines, or medicines that make you drowsy. Return to your normal activities as told by your health care provider. Ask your health care provider what activities are safe for you. Take over-the-counter and prescription medicines only as told by your health care provider. Do not use any products that contain nicotine or tobacco. These products include cigarettes, chewing tobacco, and vaping devices, such as e-cigarettes. These can delay incision healing after surgery. If you need help quitting, ask your health care provider. Contact a health care provider if: You have nausea or vomiting that does not get better with medicine. You vomit every time you eat or drink. You have pain that does not get better with medicine. You cannot urinate or have bloody urine. You develop a skin rash. You have a fever. Get help right away if: You have trouble breathing. You have chest pain. You vomit blood. These symptoms may be an emergency. Get help right away. Call 911. Do not wait to see if the symptoms will go away. Do not drive yourself to the hospital. Summary After the procedure, it is common to have a sore  throat, hoarseness, nausea, vomiting, or to feel weak, sleepy, or fatigue. For the time period you were told by your health care provider, do not drive or use machinery. Get help right away if you have difficulty breathing, have chest pain, or vomit blood. These symptoms may be an emergency. This information is not intended to replace advice given to you by your health care provider. Make sure you discuss any questions you have with your health care provider. Document Revised:  10/21/2021 Document Reviewed: 10/21/2021 Elsevier Patient Education  2024 Else[employer] Inc.How to Use Chlorhexidine at Home in the Shower Chlorhexidine gluconate (CHG) is a germ-killing (antiseptic) wash that's used to clean the skin. It can get rid of the germs that normally live on the skin and can keep them away for about 24 hours. If you're having surgery, you may be told to shower with CHG at home the night before surgery. This can help lower your risk for infection. To use CHG wash in the shower, follow the steps below. Supplies needed: CHG body wash. Clean washcloth. Clean towel. How to use CHG in the shower Follow these steps unless you're told to use CHG in a different way: Start the shower. Use your normal soap and shampoo to wash your face and hair. Turn off the shower or move out of the shower stream. Pour CHG onto a clean washcloth. Do not use any type of brush or rough sponge. Start at your neck, washing your body down to your toes. Make sure you: Wash the part of your body where the surgery will be done for at least 1 minute. Do not scrub. Do not use CHG on your head or face unless your health care provider tells you to. If it gets into your ears or eyes, rinse them well with water. Do not wash your genitals with CHG. Wash your back and under your arms. Make sure to wash skin folds. Let the CHG sit on your skin for 1-2 minutes or as long as told. Rinse your entire body in the shower, including all body  creases and folds. Turn off the shower. Dry off with a clean towel. Do not put anything on your skin afterward, such as powder, lotion, or perfume. Put on clean clothes or pajamas. If it's the night before surgery, sleep in clean sheets. General tips Use CHG only as told, and follow the instructions on the label. Use the full amount of CHG as told. This is often one bottle. Do not smoke and stay away from flames after using CHG. Your skin may feel sticky after using CHG. This is normal. The sticky feeling will go away as the CHG dries. Do not use CHG: If you have a chlorhexidine allergy or have reacted to chlorhexidine in the past. On open wounds or areas of skin that have broken skin, cuts, or scrapes. On babies younger than 108 months of age. Contact a health care provider if: You have questions about using CHG. Your skin gets irritated or itchy. You have a rash after using CHG. You swallow any CHG. Call your local poison control center [phone] in the [place] Your eyes itch badly, or they become very red or swollen. Your hearing changes. You have trouble seeing. If you can't reach your provider, go to an urgent care or emergency room. Do not drive yourself. Get help right away if: You have swelling or tingling in your mouth or throat. You make high-pitched whistling sounds when you breathe, most often when you breathe out (wheeze). You have trouble breathing. These symptoms may be an emergency. Call 911 right away. Do not wait to see if the symptoms will go away. Do not drive yourself to the hospital. This information is not intended to replace advice given to you by your health care provider. Make sure you discuss any questions you have with your health care provider. Document Revised: 02/06/2023 Document Reviewed: 02/02/2022 Elsevier Patient Education  2024 [employer].

## 2023-08-29 ENCOUNTER — Encounter (HOSPITAL_COMMUNITY): Payer: Self-pay

## 2023-08-29 ENCOUNTER — Encounter (HOSPITAL_COMMUNITY)
Admission: RE | Admit: 2023-08-29 | Discharge: 2023-08-29 | Disposition: A | Discharge status: Home / Self Care | Payer: PPO | Source: Ambulatory Visit | Attending: Orthopedic Surgery | Admitting: Orthopedic Surgery

## 2023-08-29 DIAGNOSIS — M7989 Other specified soft tissue disorders: Secondary | ICD-10-CM | POA: Insufficient documentation

## 2023-08-29 DIAGNOSIS — Z01818 Encounter for other preprocedural examination: Secondary | ICD-10-CM

## 2023-08-29 DIAGNOSIS — Z01812 Encounter for preprocedural laboratory examination: Secondary | ICD-10-CM | POA: Diagnosis not present

## 2023-08-29 LAB — CBC
HCT: 41.4 % (ref 36.0–46.0)
Hemoglobin: 13.2 g/dL (ref 12.0–15.0)
MCH: 30.7 pg (ref 26.0–34.0)
MCHC: 31.9 g/dL (ref 30.0–36.0)
MCV: 96.3 fL (ref 80.0–100.0)
Platelets: 189 10*3/uL (ref 150–400)
RBC: 4.3 MIL/uL (ref 3.87–5.11)
RDW: 12.9 % (ref 11.5–15.5)
WBC: 6.1 10*3/uL (ref 4.0–10.5)
nRBC: 0 % (ref 0.0–0.2)

## 2023-08-29 LAB — BASIC METABOLIC PANEL
Anion gap: 8 (ref 5–15)
BUN: 13 mg/dL (ref 8–23)
CO2: 25 mmol/L (ref 22–32)
Calcium: 8.9 mg/dL (ref 8.9–10.3)
Chloride: 107 mmol/L (ref 98–111)
Creatinine, Ser: 0.64 mg/dL (ref 0.44–1.00)
GFR, Estimated: >60 mL/min (ref >60)
Glucose, Bld: 108 mg/dL — ABNORMAL HIGH (ref 70–99)
Potassium: 3.8 mmol/L (ref 3.5–5.1)
Sodium: 140 mmol/L (ref 135–145)

## 2023-09-03 ENCOUNTER — Encounter (HOSPITAL_COMMUNITY): Payer: Self-pay | Admitting: Orthopedic Surgery

## 2023-09-03 ENCOUNTER — Encounter (HOSPITAL_COMMUNITY)
Admission: RE | Disposition: A | Discharge status: Home / Self Care | Payer: Self-pay | Source: Home / Self Care | Attending: Orthopedic Surgery

## 2023-09-03 ENCOUNTER — Encounter: Payer: Self-pay | Admitting: Orthopedic Surgery

## 2023-09-03 ENCOUNTER — Ambulatory Visit (HOSPITAL_COMMUNITY): Payer: Self-pay | Admitting: Anesthesiology

## 2023-09-03 ENCOUNTER — Ambulatory Visit (HOSPITAL_COMMUNITY)
Admission: RE | Admit: 2023-09-03 | Discharge: 2023-09-03 | Disposition: A | Discharge status: Home / Self Care | Payer: PPO | Attending: Orthopedic Surgery | Admitting: Orthopedic Surgery

## 2023-09-03 ENCOUNTER — Ambulatory Visit (HOSPITAL_BASED_OUTPATIENT_CLINIC_OR_DEPARTMENT_OTHER): Payer: Self-pay | Admitting: Anesthesiology

## 2023-09-03 DIAGNOSIS — X58XXXA Exposure to other specified factors, initial encounter: Secondary | ICD-10-CM | POA: Insufficient documentation

## 2023-09-03 DIAGNOSIS — S83232D Complex tear of medial meniscus, current injury, left knee, subsequent encounter: Secondary | ICD-10-CM | POA: Diagnosis not present

## 2023-09-03 DIAGNOSIS — S83242A Other tear of medial meniscus, current injury, left knee, initial encounter: Secondary | ICD-10-CM | POA: Diagnosis not present

## 2023-09-03 DIAGNOSIS — Z01818 Encounter for other preprocedural examination: Secondary | ICD-10-CM

## 2023-09-03 DIAGNOSIS — F17210 Nicotine dependence, cigarettes, uncomplicated: Secondary | ICD-10-CM | POA: Diagnosis not present

## 2023-09-03 HISTORY — PX: KNEE ARTHROSCOPY WITH MENISCAL REPAIR: SHX5653

## 2023-09-03 SURGERY — ARTHROSCOPY, KNEE, WITH MENISCUS REPAIR
Anesthesia: General | Site: Knee | Laterality: Left

## 2023-09-03 MED ORDER — BUPIVACAINE-EPINEPHRINE (PF) 0.5% -1:200000 IJ SOLN
INTRAMUSCULAR | Status: DC | PRN
  Administered 2023-09-03: 30 mL via PERINEURAL

## 2023-09-03 MED ORDER — FENTANYL CITRATE (PF) 100 MCG/2ML IJ SOLN
INTRAMUSCULAR | Status: AC
  Filled 2023-09-03: qty 2

## 2023-09-03 MED ORDER — MIDAZOLAM HCL 2 MG/2ML IJ SOLN
INTRAMUSCULAR | Status: DC | PRN
  Administered 2023-09-03: 2 mg via INTRAVENOUS

## 2023-09-03 MED ORDER — SODIUM CHLORIDE 0.9 % IR SOLN
Status: DC | PRN
  Administered 2023-09-03 (×3): 3000 mL

## 2023-09-03 MED ORDER — ONDANSETRON HCL 4 MG/2ML IJ SOLN
4.0000 mg | Freq: Once | INTRAMUSCULAR | Status: DC | PRN
Start: 2023-09-03 — End: 2023-09-03

## 2023-09-03 MED ORDER — DEXAMETHASONE SODIUM PHOSPHATE 10 MG/ML IJ SOLN
INTRAMUSCULAR | Status: DC | PRN
  Administered 2023-09-03: 4 mg via INTRAVENOUS

## 2023-09-03 MED ORDER — PHENYLEPHRINE 80 MCG/ML (10ML) SYRINGE FOR IV PUSH (FOR BLOOD PRESSURE SUPPORT)
PREFILLED_SYRINGE | INTRAVENOUS | Status: AC
  Filled 2023-09-03: qty 10

## 2023-09-03 MED ORDER — CELECOXIB 100 MG PO CAPS: 100.0000 mg | ORAL_CAPSULE | Freq: Every day | ORAL | 0 refills | Status: AC

## 2023-09-03 MED ORDER — FENTANYL CITRATE PF 50 MCG/ML IJ SOSY
25.0000 ug | PREFILLED_SYRINGE | INTRAMUSCULAR | Status: DC | PRN
  Administered 2023-09-03 (×2): 50 ug via INTRAVENOUS
  Filled 2023-09-03: qty 1

## 2023-09-03 MED ORDER — LIDOCAINE HCL (PF) 2 % IJ SOLN
INTRAMUSCULAR | Status: DC | PRN
  Administered 2023-09-03: 100 mg via INTRADERMAL

## 2023-09-03 MED ORDER — ONDANSETRON HCL 4 MG/2ML IJ SOLN
INTRAMUSCULAR | Status: AC
  Filled 2023-09-03: qty 2

## 2023-09-03 MED ORDER — CHLORHEXIDINE GLUCONATE 0.12 % MT SOLN
15.0000 mL | Freq: Once | OROMUCOSAL | Status: AC
  Administered 2023-09-03: 15 mL via OROMUCOSAL

## 2023-09-03 MED ORDER — KETOROLAC TROMETHAMINE 30 MG/ML IJ SOLN
INTRAMUSCULAR | Status: AC
  Filled 2023-09-03: qty 1

## 2023-09-03 MED ORDER — ACETAMINOPHEN 500 MG PO TABS: 1000.0000 mg | ORAL_TABLET | Freq: Three times a day (TID) | ORAL | 0 refills | Status: AC

## 2023-09-03 MED ORDER — OXYCODONE HCL 5 MG PO TABS: 5.0000 mg | ORAL_TABLET | ORAL | 0 refills | Status: AC | PRN

## 2023-09-03 MED ORDER — KETOROLAC TROMETHAMINE 30 MG/ML IJ SOLN
INTRAMUSCULAR | Status: DC | PRN
  Administered 2023-09-03: 30 mg via INTRAVENOUS

## 2023-09-03 MED ORDER — ONDANSETRON HCL 4 MG/2ML IJ SOLN
INTRAMUSCULAR | Status: DC | PRN
  Administered 2023-09-03: 4 mg via INTRAVENOUS

## 2023-09-03 MED ORDER — GLYCOPYRROLATE PF 0.2 MG/ML IJ SOSY
PREFILLED_SYRINGE | INTRAMUSCULAR | Status: DC | PRN
  Administered 2023-09-03: .2 mg via INTRAVENOUS

## 2023-09-03 MED ORDER — PROPOFOL 10 MG/ML IV BOLUS
INTRAVENOUS | Status: AC
  Filled 2023-09-03: qty 20

## 2023-09-03 MED ORDER — PROPOFOL 10 MG/ML IV BOLUS
INTRAVENOUS | Status: DC | PRN
  Administered 2023-09-03: 150 mg via INTRAVENOUS

## 2023-09-03 MED ORDER — ORAL CARE MOUTH RINSE: 15.0000 mL | Freq: Once | OROMUCOSAL | Status: AC

## 2023-09-03 MED ORDER — NALOXONE HCL 0.4 MG/ML IJ SOLN
INTRAMUSCULAR | Status: AC
  Filled 2023-09-03: qty 1

## 2023-09-03 MED ORDER — PHENYLEPHRINE HCL-NACL 20-0.9 MG/250ML-% IV SOLN
INTRAVENOUS | Status: AC
  Filled 2023-09-03: qty 250

## 2023-09-03 MED ORDER — ONDANSETRON HCL 4 MG PO TABS: 4.0000 mg | ORAL_TABLET | Freq: Three times a day (TID) | ORAL | 0 refills | Status: AC | PRN

## 2023-09-03 MED ORDER — FENTANYL CITRATE PF 50 MCG/ML IJ SOSY
PREFILLED_SYRINGE | INTRAMUSCULAR | Status: AC
  Filled 2023-09-03: qty 1

## 2023-09-03 MED ORDER — OXYCODONE HCL 5 MG/5ML PO SOLN: 5.0000 mg | Freq: Once | ORAL | Status: AC | PRN

## 2023-09-03 MED ORDER — LACTATED RINGERS IV SOLN: INTRAVENOUS | Status: DC

## 2023-09-03 MED ORDER — PHENYLEPHRINE 80 MCG/ML (10ML) SYRINGE FOR IV PUSH (FOR BLOOD PRESSURE SUPPORT)
PREFILLED_SYRINGE | INTRAVENOUS | Status: DC | PRN
  Administered 2023-09-03 (×3): 160 ug via INTRAVENOUS

## 2023-09-03 MED ORDER — GLYCOPYRROLATE PF 0.2 MG/ML IJ SOSY
PREFILLED_SYRINGE | INTRAMUSCULAR | Status: AC
  Filled 2023-09-03: qty 1

## 2023-09-03 MED ORDER — NALOXONE HCL 0.4 MG/ML IJ SOLN
INTRAMUSCULAR | Status: DC | PRN
  Administered 2023-09-03 (×2): 80 ug via INTRAVENOUS

## 2023-09-03 MED ORDER — FENTANYL CITRATE (PF) 100 MCG/2ML IJ SOLN
INTRAMUSCULAR | Status: DC | PRN
Start: 2023-09-03 — End: 2023-09-03
  Administered 2023-09-03 (×2): 25 ug via INTRAVENOUS
  Administered 2023-09-03 (×3): 50 ug via INTRAVENOUS

## 2023-09-03 MED ORDER — OXYCODONE HCL 5 MG PO TABS
5.0000 mg | ORAL_TABLET | Freq: Once | ORAL | Status: AC | PRN
  Administered 2023-09-03: 5 mg via ORAL
  Filled 2023-09-03: qty 1

## 2023-09-03 MED ORDER — CEFAZOLIN SODIUM-DEXTROSE 2-4 GM/100ML-% IV SOLN
INTRAVENOUS | Status: AC
  Filled 2023-09-03: qty 100

## 2023-09-03 MED ORDER — LIDOCAINE HCL (PF) 2 % IJ SOLN
INTRAMUSCULAR | Status: AC
  Filled 2023-09-03: qty 5

## 2023-09-03 MED ORDER — CEFAZOLIN SODIUM-DEXTROSE 2-4 GM/100ML-% IV SOLN
2.0000 g | INTRAVENOUS | Status: AC
  Administered 2023-09-03: 2 g via INTRAVENOUS

## 2023-09-03 MED ORDER — ASPIRIN 81 MG PO TBEC: 81.0000 mg | DELAYED_RELEASE_TABLET | Freq: Two times a day (BID) | ORAL | 0 refills | Status: AC

## 2023-09-03 MED ORDER — BUPIVACAINE-EPINEPHRINE (PF) 0.5% -1:200000 IJ SOLN
INTRAMUSCULAR | Status: AC
  Filled 2023-09-03: qty 30

## 2023-09-03 MED ORDER — MIDAZOLAM HCL 2 MG/2ML IJ SOLN
INTRAMUSCULAR | Status: AC
Start: 2023-09-03 — End: ?
  Filled 2023-09-03: qty 2

## 2023-09-03 SURGICAL SUPPLY — 48 items
BANDAGE ESMARK 6X9 LF (GAUZE/BANDAGES/DRESSINGS) ×4 IMPLANT
BNDG ELASTIC 4X5.8 VLCR NS LF (GAUZE/BANDAGES/DRESSINGS) IMPLANT
BNDG ELASTIC 6X5.8 VLCR NS LF (GAUZE/BANDAGES/DRESSINGS) ×4 IMPLANT
BNDG ESMARK 6X9 LF (GAUZE/BANDAGES/DRESSINGS) ×1
CHLORAPREP W/TINT 26 (MISCELLANEOUS) ×4 IMPLANT
CLOTH BEACON ORANGE TIMEOUT ST (SAFETY) ×2 IMPLANT
COUNTER NDL MAGNETIC 40 RED (SET/KITS/TRAYS/PACK) ×2 IMPLANT
COUNTER NEEDLE MAGNETIC 40 RED (SET/KITS/TRAYS/PACK) ×1
CUFF TRNQT CYL 34X4.125X (TOURNIQUET CUFF) ×2 IMPLANT
CUTTER BONE 4.0MM X 13CM (MISCELLANEOUS) IMPLANT
DECANTER SPIKE VIAL GLASS SM (MISCELLANEOUS) ×4 IMPLANT
GAUZE PAD ABD 8X10 STRL (GAUZE/BANDAGES/DRESSINGS) IMPLANT
GAUZE SPONGE 4X4 12PLY STRL (GAUZE/BANDAGES/DRESSINGS) ×2 IMPLANT
GAUZE XEROFORM 1X8 LF (GAUZE/BANDAGES/DRESSINGS) IMPLANT
GLOVE BIO SURGEON STRL SZ8 (GLOVE) ×6 IMPLANT
GLOVE BIOGEL PI IND STRL 7.0 (GLOVE) ×4 IMPLANT
GLOVE BIOGEL PI IND STRL 8 (GLOVE) ×2 IMPLANT
GLOVE ECLIPSE 6.5 STRL STRAW (GLOVE) IMPLANT
GOWN STRL REUS W/TWL LRG LVL3 (GOWN DISPOSABLE) ×2 IMPLANT
GOWN STRL REUS W/TWL XL LVL3 (GOWN DISPOSABLE) ×2 IMPLANT
IV NS IRRIG 3000ML ARTHROMATIC (IV SOLUTION) ×4 IMPLANT
KIT SUTLOC MENISCAL ROOT REP (Anchor) IMPLANT
KIT TURNOVER CYSTO (KITS) ×2 IMPLANT
MANIFOLD NEPTUNE II (INSTRUMENTS) ×2 IMPLANT
MARKER SKIN DUAL TIP RULER LAB (MISCELLANEOUS) ×2 IMPLANT
NDL HYPO 18GX1.5 BLUNT FILL (NEEDLE) ×2 IMPLANT
NDL HYPO 21X1.5 SAFETY (NEEDLE) ×2 IMPLANT
NDL SPNL 18GX3.5 QUINCKE PK (NEEDLE) ×2 IMPLANT
NEEDLE HYPO 18GX1.5 BLUNT FILL (NEEDLE) ×1
NEEDLE HYPO 21X1.5 SAFETY (NEEDLE) ×1
NEEDLE SPNL 18GX3.5 QUINCKE PK (NEEDLE) ×1
PACK ARTHRO LIMB DRAPE STRL (MISCELLANEOUS) ×2 IMPLANT
PAD ABD 5X9 TENDERSORB (GAUZE/BANDAGES/DRESSINGS) ×2 IMPLANT
PAD ARMBOARD 7.5X6 YLW CONV (MISCELLANEOUS) ×2 IMPLANT
PAD COLD SHLDR WRAP-ON (PAD) ×2 IMPLANT
PADDING CAST COTTON 6X4 STRL (CAST SUPPLIES) ×2 IMPLANT
PORT APPOLLO RF 90DEGREE MULTI (SURGICAL WAND) ×2 IMPLANT
SET ARTHROSCOPY INST (INSTRUMENTS) ×2 IMPLANT
SET BASIN LINEN APH (SET/KITS/TRAYS/PACK) ×2 IMPLANT
SUT 3-0 BLK 1X30 PSL (SUTURE) ×2 IMPLANT
SUT ETHILON 3 0 FSL (SUTURE) IMPLANT
SUT MNCRL AB 4-0 PS2 18 (SUTURE) IMPLANT
SUTURE TAPE TIGERLINK 1.3MM BL (SUTURE) IMPLANT
SUTURETAPE TIGERLINK 1.3MM BL (SUTURE) ×1
SYR 10ML LL (SYRINGE) ×2 IMPLANT
SYR 30ML LL (SYRINGE) ×4 IMPLANT
TUBE CONNECTING 12X1/4 (SUCTIONS) ×2 IMPLANT
TUBING IN/OUT FLOW W/MAIN PUMP (TUBING) ×2 IMPLANT

## 2023-09-03 NOTE — Op Note (Signed)
Orthopaedic Surgery Operative Note (CSN: [id number])  Cynthia Chavez  [birth year] Date of Surgery: 09/03/2023   Diagnoses:  Left knee, medial meniscus posterior root tear  Procedure: Left knee arthroscopy, with medial meniscus posterior root repair   Operative Finding Exam under anesthesia: Range of motion from 0-130 degrees.  Negative Lachman.  No increased laxity varus or valgus stress. Suprapatellar pouch: No loose bodies. Patellofemoral Compartment: Grade 2/3 changes on the trochlea and the undersurface of the patella Medial Compartment: Grade 2/3 changes within the medial femoral condyle, with a tear at the posterior root Lateral Compartment: Grade 2 changes of the cartilage.  Meniscus is intact. Intercondylar Notch: ACL intact  Successful completion of the planned procedure.  Posterior root repair completed with the suture lock anchor device.   Post-Op Diagnosis: Same Surgeons:Primary: Oliver Barre, MD Location: [facility] OR ROOM 4 Anesthesia: General with local anesthetic Antibiotics: Ancef 2 g Tourniquet time:  Total Tourniquet Time Documented: Thigh (Left) - 58 minutes Total: Thigh (Left) - 58 minutes  Estimated Blood Loss: Minimal Complications: None Specimens: None  Implants: Implant Name Type Inv. Item Serial No. Manufacturer Lot No. LRB No. Used Action  KIT SUTLOC MENISCAL ROOT REP - [id number] Anchor KIT SUTLOC MENISCAL ROOT REP  [employer] [id number] Left 1 Implanted    Indications for Surgery:   Cynthia Chavez is a 68 y.o. female with progressively worsening left knee pain.  She injured her knee greater than 6 months ago.  She was evaluated by my partner.  She was noted to have a tear of the posterior horn of the medial meniscus, with underlying bony edema.  Her pain ultimately improved, with medications, activity modifications and an injection.  However, over the past couple months, the pain progressively worsened.  I evaluated her in clinic, we discussed multiple  treatment options.  Ultimately, we decided proceed with left knee arthroscopy, with possibility of partial meniscectomy versus meniscus repair.  Benefits and risks of operative and nonoperative management were discussed prior to surgery with  and informed consent form was completed.  Specific risks including infection, need for additional surgery, bleeding, persistent pain, stiffness, progression of arthritis, blood clots and more severe complications associated with anesthesia were discussed.  All questions have been answered.  She elected proceed.  Surgical consent was finalized.   Procedure:   The patient was identified properly. Informed consent was obtained and the surgical site was marked. The patient was taken up to suite where general anesthesia was induced. The patient was placed in the supine position with a post against the surgical leg and a nonsterile tourniquet applied. The surgical leg was then prepped and draped usual sterile fashion.  A standard surgical timeout was performed.  2 standard anterior portals were made and diagnostic arthroscopy performed. Please note the findings as noted above.  Overall, the knee the look pretty good.  There were some areas of cartilage wear, but this was relatively minor.  Within the patellofemoral compartment, there were some grade 2/3 changes within the trochlea as well as the patella.  Lateral compartment was intact.  Meniscus was not damaged.  The ACL and PCL were intact within the intercondylar notch.  No loose bodies.  Within the medial compartment, there were grade 2/3 changes of the medial femoral condyle, with grade 2 changes on the tibial plateau.  The meniscus was intact, with the exception of the posterior root, which was torn, with a little bit of scarring in.  However, the attachment of the posterior  root was unstable.  As a result, I made the decision to proceed with a meniscus root repair.  A passport cannula was introduced into the medial  femoral condyle.  Our targeting device was introduced.  We plan to place the drill guide underneath the attachment of the posterior root.  We then made an incision over the proximal tibia, and bluntly dissected down to bone.  The drill guide was deployed and pressed against bone.  We used the drill guide to drill through the proximal tibia, until we saw the drill tip entered the medial femoral condyle.  The inner portion of the cannulated drill bit was removed.  We then introduced a Nitinol wire, and this was removed from the passport cannula.  We passed a tiger loop stitch through our drill tunnel and out the proximal tibia.  The loop again remained coming out of the medial compartment.  Our suture lock device was then subsequently passed, and deployed, just below the surface of the tibia.  We pulled tension proximal and distal to ensure that the anchor had sufficiently deployed.  All sutures were then removed at the lateral portal.  Viewing from the lateral portal, the sutures were removed out the passport cannula.  We used a knee scorpion suture passing device to into the medial compartment, and pass our first stitch through the meniscus tissue.  We watch this stitch then get pulled down the drill tunnel out the proximal tibia.  This was provisionally tightened.  We did leave some slack in order to pass our second stitch.  The second passing stitch was then introduced with the scorpion device, and deployed through the meniscus tissue.  This was once again she shuttled through the tunnel and the tibia.  At this point, both sutures were provisionally tightened.  Under direct visualization, the meniscus tissue was reduced back to its footprint.  We had good tension on both sutures.  I introduced a probe, and the meniscus could not be displaced.  The knee was cycled several times, and the sutures were once again tightened further.  The probe confirmed that the meniscus was stable.  In order to aid in healing, I then  used an awl within the intercondylar notch to introduce some bleeding.  We placed several holes within the notch.  Irrigation was turned off, and we could see the blood coming from these holes.  Nothing else was needed within the knee.  There were no loose bodies.  Instruments and the camera were subsequently removed.  Incisions closed with 3-0 nylon, followed by soft dressing.  We placed an ice machine, and a knee brace locked in extension. The patient was awoken from general anesthesia and taken to the PACU in stable condition without complication.      Post-operative plan:  The patient will be discharged from the PACU, once she has recovered from anesthesia.   The patient will be nonweightbearing on the left lower extremity, with her knee locked in extension.  She is to wear the brace at all times. DVT prophylaxis Aspirin 81 mg twice daily for 6 weeks.  Pain control with PRN pain medication preferring oral medicines.   Follow up plan will be scheduled in approximately 10-14 days for incision check and XR.

## 2023-09-03 NOTE — Interval H&P Note (Signed)
History and Physical Interval Note:  09/03/2023 9:28 AM  Cynthia Chavez  has presented today for surgery, with the diagnosis of LEFT KNEE MEDIAL MENISCAL TEAR.  The various methods of treatment have been discussed with the patient and family. After consideration of risks, benefits and other options for treatment, the patient has consented to  Procedure(s): LEFT KNEE ARTHROSCOPY WITH PARTIAL MENISECTOMY (Left) LEFT KNEE ARTHROSCOPY WITH MENISCAL REPAIR (Left) as a surgical intervention.  The patient's history has been reviewed, patient examined, no change in status, stable for surgery.  I have reviewed the patient's chart and labs.  Questions were answered to the patient's satisfaction.    Left knee arthroscopy, partial medial meniscectomy vs meniscus repair.  All questions answered.  She is ready to proceed.    Oliver Barre

## 2023-09-03 NOTE — Transfer of Care (Signed)
Immediate Anesthesia Transfer of Care Note  Patient: Cynthia Chavez  Procedure(s) Performed: LEFT KNEE ARTHROSCOPY WITH POSTERIOR ROOT MENISCAL REPAIR (Left: Knee)  Patient Location: PACU  Anesthesia Type:General  Level of Consciousness: drowsy  Airway & Oxygen Therapy: Patient Spontanous Breathing and Patient connected to face mask oxygen  Post-op Assessment: Report given to RN and Post -op Vital signs reviewed and stable  Post vital signs: Reviewed and stable  Last Vitals:  Vitals Value Taken Time  BP 163/87 09/03/23 1112  Temp    Pulse 89 09/03/23 1114  Resp 13 09/03/23 1114  SpO2 100 % 09/03/23 1114  Vitals shown include unfiled device data.  Last Pain:  Vitals:   09/03/23 0756  TempSrc: Oral  PainSc: 0-No pain         Complications: No notable events documented.

## 2023-09-03 NOTE — Discharge Instructions (Addendum)
Mark A. Dallas Schimke, MD [facility] [address] [place],  [place]  [address] Phone: [phone] Fax: [phone]    POST-OPERATIVE INSTRUCTIONS - MENISCUS ROOT REPAIR  WOUND CARE You may remove the Operative Dressing on Post-Op Day #3 (72hrs after surgery).   If you feel more comfortable with it you can leave all dressings in place till your 2 week follow-up appointment.   KEEP THE INCISIONS CLEAN AND DRY. An ACE wrap may be used to control swelling, do not wrap this too tight.  If the initial ACE wrap feels too tight or constricting you may loosen it. There may be a small amount of fluid/bleeding leaking at the surgical site.  This is normal; the knee is filled with fluid during the procedure and can leak for 24-48hrs after surgery.  You may change/reinforce the bandage as needed.  Use the Cryocuff, GameReady or Ice as often as possible for the first 3-4 days, then as needed for pain relief. Always keep a towel, ACE wrap or other barrier between the cooling unit and your skin.  You may shower on Post-Op Day #3. Gently pat the area dry. Do not soak the knee in water.  Do not go swimming in the pool or ocean until 4 weeks after surgery or when otherwise instructed.  BRACE/AMBULATION Your leg will be placed in a brace post-operatively.  You will need to wear your brace at all times until we discuss it further.  It should be locked in full extension (0 degrees) if adjustable.   You will be instructed on further bracing after your first visit. Use crutches or a walker, but please do not put weight on your leg.  It is ok to rest your foot on the ground, but no weight.   REGIONAL ANESTHESIA (NERVE BLOCKS) - The anesthesia team may have performed a nerve block for you if safe in the setting of your care.  This is a great tool used to minimize pain.  Typically the block may start wearing off overnight.  This can be a challenging period but please utilize your as  needed pain medications to try and manage this period and know it will be a brief transition as the nerve block wears completely   POST-OP MEDICATIONS- Multimodal approach to pain control In general your pain will be controlled with a combination of substances.  Prescriptions unless otherwise discussed are electronically sent to your pharmacy.  This is a carefully made plan we use to minimize narcotic use.     Celebrex - Anti-inflammatory medication taken on a scheduled basis.  Continue to take this medication Acetaminophen - Non-narcotic pain medicine taken as needed.  Be careful not to take too much acetaminophen Oxycodone - This is a strong narcotic, to be used only on an "as needed" basis for pain. Aspirin 81mg  - This medicine is used to minimize the risk of blood clots after surgery.  Re-start this medicine Zofran - take as needed for nausea   FOLLOW-UP Please call the office to schedule a follow-up appointment for your incision check if you do not already have one, 7-10 days post-operatively. IF YOU HAVE ANY QUESTIONS, PLEASE FEEL FREE TO CALL OUR OFFICE.  HELPFUL INFORMATION  If you had a block, it will wear off between 8-24 hrs postop typically.  This is period when your pain may go from nearly zero to the pain you would have had post-op without the block.  This is an abrupt transition but  nothing dangerous is happening.  You may take an extra dose of narcotic when this happens.  Keep your leg elevated to decrease swelling, which will then in turn decrease your pain. I would elevate the foot of your bed by putting a couple of couch pillows between your mattress and box spring. I would not keep pillow directly under your ankle.  You must wear the brace locked while sleeping and ambulating until follow-up.   There will be MORE swelling on days 1-3 than there is on the day of surgery.  This also is normal. The swelling will decrease with the anti-inflammatory medication, ice and keeping  it elevated. The swelling will make it more difficult to bend your knee. As the swelling goes down your motion will become easier  You may develop swelling and bruising that extends from your knee down to your calf and perhaps even to your foot over the next week. Do not be alarmed. This too is normal, and it is due to gravity  There may be some numbness adjacent to the incision site. This may last for 6-12 months or longer in some patients and is expected.  You may return to sedentary work/school in the next couple of days when you feel up to it. You will need to keep your leg elevated as much as possible   You should wean off your narcotic medicines as soon as you are able.  Most patients will be off or using minimal narcotics before their first postop appointment.   We suggest you use the pain medication the first night prior to going to bed, in order to ease any pain when the anesthesia wears off. You should avoid taking pain medications on an empty stomach as it will make you nauseous.  Do not drink alcoholic beverages or take illicit drugs when taking pain medications.  It is against the law to drive while taking narcotics. You cannot drive if your Left leg is in brace locked in extension.  Pain medication may make you constipated.  Below are a few solutions to try in this order: Decrease the amount of pain medication if you aren't having pain. Drink lots of decaffeinated fluids. Drink prune juice and/or each dried prunes  If the first 3 don't work start with additional solutions Take Colace - an over-the-counter stool softener Take Senokot - an over-the-counter laxative Take Miralax - a stronger over-the-counter laxative

## 2023-09-03 NOTE — H&P (Signed)
Below is the most recent clinic note for Cynthia Chavez; any pertinent information regarding their recent medical history will be updated on the day of surgery.   New Patient Visit  Assessment: Cynthia Chavez is a 68 y.o. female with the following: 1. Tear of medial meniscus of left knee, current, unspecified tear type, initial encounter  Plan: Cynthia Chavez continues to have pain in the left knee.  MRI demonstrates a tear of the medial meniscus, close to the root of the posterior horn.  This is resulting in some extrusion, as well as some bony edema on MRI.  She is now at a point where her pain is restricting her activities.  She is no longer seeing improvements in her symptoms.  She is interested in surgery.  Procedure was discussed in great detail in clinic today.  I have recommended left knee arthroscopy, with partial meniscectomy versus meniscus root repair.  Final decision will be made at the time of surgery.  She would like to proceed with surgery sometime in early December.  We will confirm clearance, and then finalize a plan and date for surgery.  Risks and benefits of the surgery, including, but not limited to infection, bleeding, persistent pain, need for further surgery, progression of arthritis, stiffness and more severe complications associated with anesthesia were discussed with the patient.  The patient has elected to proceed.   Follow-up: No follow-ups on file.  Subjective:  No chief complaint on file.   History of Present Illness: Cynthia Chavez is a 68 y.o. female who presents for evaluation of left knee pain.  She has previously been followed by Dr. Hilda Lias.  He obtained an MRI.  He discussed possible surgery with her, but she was not interested in considering surgery, stating that her knee continues to improve.  However, she has not had much improvement in her pain since her last visit.  She continues to have pain in the medial aspect of the left knee.  It is affecting her  daily function.  She has no interest in surgery.   Review of Systems: No fevers or chills No numbness or tingling No chest pain No shortness of breath No bowel or bladder dysfunction No GI distress No headaches   Medical History:  Past Medical History:  Diagnosis Date   Anxiety    Breast cancer (HCC)    Cancer (HCC)    breast cancer   Depression    Edema    bilateral feet and leg swelling   GERD (gastroesophageal reflux disease)    History of hiatal hernia    History of radiation therapy 02/12/17- 03/26/17   Chest wall and regional nodes, right- 50 Gy in 25 fractions, Chest Wall, boost, right- 10 Gy in 5 fractions.    Hypothyroidism    Malaise    Personal history of chemotherapy    Personal history of radiation therapy    Port catheter in place 09/08/2016   Sprain of shoulder, right 11/23/2020   Thyroid disease    Varicose veins     Past Surgical History:  Procedure Laterality Date   ABDOMINAL HYSTERECTOMY     AXILLARY LYMPH NODE BIOPSY Right 01/03/2017   Procedure: RIGHT AXILLARY LYMPH NODE SEED GUIDED EXCISIONAL BIOPSY;  Surgeon: Emelia Loron, MD;  Location: [facility] OR;  Service: General;  Laterality: Right;   BREAST SURGERY     CHOLECYSTECTOMY     COLONOSCOPY     ESOPHAGEAL DILATION     MASTECTOMY Right  MASTECTOMY W/ SENTINEL NODE BIOPSY Right 01/03/2017   axillary   MASTECTOMY W/ SENTINEL NODE BIOPSY Right 01/03/2017   Procedure: RIGHT TOTAL MASTECTOMY WITH RIGHT AXILLARY SENTINEL LYMPH NODE BIOPSY;  Surgeon: Emelia Loron, MD;  Location: [facility] OR;  Service: General;  Laterality: Right;   PORTACATH PLACEMENT Left 08/17/2016   Procedure: INSERTION PORT-A-CATH WITH [place];  Surgeon: Emelia Loron, MD;  Location: [facility];  Service: General;  Laterality: Left;    Family History  Problem Relation Age of Onset   COPD Father    Cancer Father    Social History   Tobacco Use   Smoking status: Some Days    Current packs/day: 0.50    Average  packs/day: 0.5 packs/day for 30.4 years (15.2 ttl pk-yrs)    Types: Cigarettes    Start date: 04/19/1993   Smokeless tobacco: Never   Tobacco comments:    she stopped smoking for a long time, but started back.   Vaping Use   Vaping status: Never Used  Substance Use Topics   Alcohol use: No   Drug use: No    Allergies  Allergen Reactions   Beef-Derived Drug Products Hives, Shortness Of Breath, Swelling and Rash   Ivp Dye [Iodinated Contrast Media] Other (See Comments)    Chest pain   Other Shortness Of Breath, Swelling and Other (See Comments)    Versed or Fentanyl when used for endoscopy caused throat and tongue swelling   Pork-Derived Products Hives, Shortness Of Breath and Swelling   Augmentin [Amoxicillin-Pot Clavulanate]     Unknown reaction    Dilaudid [Hydromorphone Hcl]     PT had CP and felt shaky. Didn't like the feeling, but sx lessened w/in 10 min of IV administration   Codeine Palpitations    spasms    Current Meds  Medication Sig   acetaminophen (TYLENOL) 500 MG tablet Take 1 tablet (500 mg total) by mouth every 6 (six) hours as needed.   esomeprazole (NEXIUM) 20 MG capsule Take 20 mg by mouth daily as needed (acid reflux).   letrozole (FEMARA) 2.5 MG tablet Take 1 tablet by mouth once daily   levothyroxine (SYNTHROID) 150 MCG tablet TAKE 1 TABLET BY MOUTH ONCE DAILY BEFORE BREAKFAST, EXCEPT FOR SUNDAY. OMIT SUNDAY DOSE    Objective: BP (!) 140/82   Pulse 75   Temp 97.9 F (36.6 C) (Oral)   Resp 17   Ht 5\' 7"  (1.702 m)   Wt 83.5 kg   SpO2 97%   BMI 28.83 kg/m   Physical Exam:  General: Alert and oriented. Gait: Left sided antalgic gait.  Evaluation left knee demonstrates no deformity.  Mild effusion is appreciated.  Tenderness to palpation along the medial joint line.  Pain with hyperflexion.  She has some tenderness to palpation over the medial plateau.  She is able to achieve full extension.  Negative Lachman.  No increased laxity varus or valgus  stress.  IMAGING: I personally reviewed images previously obtained in clinic  Left knee MRI  IMPRESSION: 1. Radial tear involving the root of the posterior horn of the medial meniscus with mild peripheral extrusion of the meniscus from the joint space. 2. Prominent marrow edema throughout the medial tibial plateau with probable underlying subchondral insufficiency fracture. Possible additional acute osseous injury anteriorly in the lateral tibial plateau. 3. Moderate patellofemoral and medial compartment degenerative changes. 4. Intact lateral meniscus, cruciate and collateral ligaments.     Oliver Barre, MD  09/03/2023 9:27 AM

## 2023-09-03 NOTE — Anesthesia Preprocedure Evaluation (Signed)
Anesthesia Evaluation  Patient identified by MRN, date of birth, ID band Patient awake    Reviewed: Allergy & Precautions, H&P , NPO status , Patient's Chart, lab work & pertinent test results, reviewed documented beta blocker date and time   Airway Mallampati: II  TM Distance: >3 FB Neck ROM: full    Dental no notable dental hx.    Pulmonary neg pulmonary ROS, Current Smoker and Patient abstained from smoking.   Pulmonary exam normal breath sounds clear to auscultation       Cardiovascular Exercise Tolerance: Good hypertension, negative cardio ROS  Rhythm:regular Rate:Normal     Neuro/Psych negative neurological ROS  negative psych ROS   GI/Hepatic negative GI ROS, Neg liver ROS,,,  Endo/Other  negative endocrine ROS    Renal/GU negative Renal ROS  negative genitourinary   Musculoskeletal   Abdominal   Peds  Hematology negative hematology ROS (+)   Anesthesia Other Findings   Reproductive/Obstetrics negative OB ROS                             Anesthesia Physical Anesthesia Plan  ASA: 2  Anesthesia Plan: General and General LMA   Post-op Pain Management:    Induction:   PONV Risk Score and Plan: Ondansetron  Airway Management Planned:   Additional Equipment:   Intra-op Plan:   Post-operative Plan:   Informed Consent: I have reviewed the patients History and Physical, chart, labs and discussed the procedure including the risks, benefits and alternatives for the proposed anesthesia with the patient or authorized representative who has indicated his/her understanding and acceptance.     Dental Advisory Given  Plan Discussed with: CRNA  Anesthesia Plan Comments:        Anesthesia Quick Evaluation

## 2023-09-03 NOTE — Anesthesia Procedure Notes (Addendum)
Procedure Name: LMA Insertion Date/Time: 09/03/2023 9:41 AM  Performed by: Julian Reil, CRNAPre-anesthesia Checklist: Patient identified, Emergency Drugs available, Suction available and Patient being monitored Patient Re-evaluated:Patient Re-evaluated prior to induction Oxygen Delivery Method: Circle system utilized Preoxygenation: Pre-oxygenation with 100% oxygen Induction Type: IV induction Ventilation: Mask ventilation without difficulty LMA: LMA with gastric port inserted LMA Size: 4.0 Tube type: Oral Number of attempts: 1 Placement Confirmation: positive ETCO2 Tube secured with: Tape Dental Injury: Teeth and Oropharynx as per pre-operative assessment

## 2023-09-05 ENCOUNTER — Encounter (HOSPITAL_COMMUNITY): Payer: Self-pay | Admitting: Orthopedic Surgery

## 2023-09-09 NOTE — Anesthesia Postprocedure Evaluation (Signed)
Anesthesia Post Note  Patient: Cynthia Chavez  Procedure(s) Performed: LEFT KNEE ARTHROSCOPY WITH POSTERIOR ROOT MENISCAL REPAIR (Left: Knee)  Patient location during evaluation: Phase II Anesthesia Type: General Level of consciousness: awake Pain management: pain level controlled Vital Signs Assessment: post-procedure vital signs reviewed and stable Respiratory status: spontaneous breathing and respiratory function stable Cardiovascular status: blood pressure returned to baseline and stable Postop Assessment: no headache and no apparent nausea or vomiting Anesthetic complications: no Comments: Late entry   No notable events documented.   Last Vitals:  Vitals:   09/03/23 1230 09/03/23 1256  BP: 138/81 (!) 152/93  Pulse: 83 73  Resp: 19   Temp:  36.6 C  SpO2: 94% 96%    Last Pain:  Vitals:   09/03/23 1256  TempSrc: Axillary  PainSc: 3                  Windell Norfolk

## 2023-09-12 ENCOUNTER — Ambulatory Visit: Payer: Medicare HMO

## 2023-09-14 ENCOUNTER — Ambulatory Visit (INDEPENDENT_AMBULATORY_CARE_PROVIDER_SITE_OTHER): Payer: PPO | Admitting: Orthopedic Surgery

## 2023-09-14 ENCOUNTER — Other Ambulatory Visit (INDEPENDENT_AMBULATORY_CARE_PROVIDER_SITE_OTHER): Payer: Self-pay

## 2023-09-14 ENCOUNTER — Encounter: Payer: Self-pay | Admitting: Orthopedic Surgery

## 2023-09-14 DIAGNOSIS — G8929 Other chronic pain: Secondary | ICD-10-CM

## 2023-09-14 DIAGNOSIS — S83242D Other tear of medial meniscus, current injury, left knee, subsequent encounter: Secondary | ICD-10-CM

## 2023-09-14 DIAGNOSIS — M25562 Pain in left knee: Secondary | ICD-10-CM | POA: Diagnosis not present

## 2023-09-14 NOTE — Progress Notes (Signed)
 Orthopaedic Postop Note  Assessment: Cynthia Chavez is a 68 y.o. female s/p left knee arthroscopy, with medial meniscus, posterior root repair  DOS: 09/03/2023  Plan: Sutures out.  Steri-Strips placed Procedure was discussed She would prefer to avoid physical therapy Continue to use the brace at all times.  Okay to unlock to 90 degrees during the day.  Locked in extension at night. Locking and unlocking the brace was demonstrated in clinic today. Toe-touch weightbearing for the next week, and then progress to full weightbearing by 6 weeks Medicines as needed. Continue to use aspirin . Follow-up in 4 weeks   Follow-up: Return in about 4 weeks (around 10/12/2023). XR at next visit: None  Subjective:  Chief Complaint  Patient presents with   Post-op Follow-up    Left knee improving     History of Present Illness: Cynthia Chavez is a 68 y.o. female who presents following the above stated procedure.  Surgery was approximate 10 days ago.  She is doing well.  Pain is improving.  She stopped taking narcotic pain medicines.  She is using a cane to assist with ambulation.  She has been bearing some weight through the toe.  Review of Systems: No fevers or chills No numbness or tingling No Chest Pain No shortness of breath   Objective: There were no vitals taken for this visit.  Physical Exam:  left knee surgical incisions are healing well.  No surrounding erythema or drainage.  Minimal swelling about the knee.  She tolerates gentle range of motion wearing the brace.  No skin breakdown from the brace.  Mild tenderness to palpation along the medial joint line.  IMAGING: I personally ordered and reviewed the following images:  X-rays of the left knee were obtained in clinic today.  No acute injuries are noted.  Well-maintained alignment.  Well-maintained joint space.  No bony lesions.  Impression: Negative left knee x-ray following knee arthroscopy   Oneil DELENA Horde,  MD 09/14/2023 10:18 AM

## 2023-10-05 ENCOUNTER — Ambulatory Visit
Admission: RE | Admit: 2023-10-05 | Discharge: 2023-10-05 | Disposition: A | Discharge status: Home / Self Care | Payer: PPO | Source: Ambulatory Visit | Attending: Adult Health | Admitting: Adult Health

## 2023-10-05 DIAGNOSIS — Z1231 Encounter for screening mammogram for malignant neoplasm of breast: Secondary | ICD-10-CM

## 2023-10-16 ENCOUNTER — Ambulatory Visit (INDEPENDENT_AMBULATORY_CARE_PROVIDER_SITE_OTHER): Payer: PPO | Admitting: Orthopedic Surgery

## 2023-10-16 ENCOUNTER — Encounter: Payer: Self-pay | Admitting: Orthopedic Surgery

## 2023-10-16 DIAGNOSIS — S83242D Other tear of medial meniscus, current injury, left knee, subsequent encounter: Secondary | ICD-10-CM

## 2023-10-16 NOTE — Patient Instructions (Signed)

## 2023-10-16 NOTE — Progress Notes (Signed)
 Orthopaedic Postop Note  Assessment: Cynthia Chavez is a 68 y.o. female s/p left knee arthroscopy, with medial meniscus, posterior root repair  DOS: 09/03/2023  Plan: Mrs. Spindle is progressing appropriately.  She has near full range of motion.  Limited pain.  She does have some weakness in her thigh.  Okay for her to remove the brace.  Limited weightbearing with a flexed the left knee.  Continue to use a cane for longer durations, or out in public.  Medications as needed.  Follow-up in 6 weeks.   Follow-up: Return in about 6 weeks (around 11/27/2023). XR at next visit: None  Subjective:  Chief Complaint  Patient presents with   Routine Post Op    L KA DOS: 09/03/2023       History of Present Illness: Cynthia Chavez is a 68 y.o. female who presents following the above stated procedure.  Surgery was approximately 6 weeks ago.  She takes occasional Tylenol for pain.  She has been ambulating well using the brace.  She continues to use a cane.  She has grown tired of the brace.  She feels as though the brace is irritating the medial aspect of her left knee.  No issues with her incisions.  She has not noticed any swelling.   Review of Systems: No fevers or chills No numbness or tingling No Chest Pain No shortness of breath   Objective: There were no vitals taken for this visit.  Physical Exam:  Left knee surgical incisions have healed.  No surrounding erythema or drainage.  No swelling about the knee.  She is able to achieve full extension, with flexion to approximately 100 degrees.  No increased laxity varus or valgus stress.  Minimal tenderness along the medial joint line.  IMAGING: I personally ordered and reviewed the following images:  No new imaging obtained today.   Oliver Barre, MD 10/16/2023 10:46 AM

## 2023-11-12 ENCOUNTER — Ambulatory Visit (INDEPENDENT_AMBULATORY_CARE_PROVIDER_SITE_OTHER)

## 2023-11-12 DIAGNOSIS — Z23 Encounter for immunization: Secondary | ICD-10-CM

## 2023-11-12 NOTE — Progress Notes (Signed)
 Patient is in office today for a nurse visit for [employer]. Patient Injection was given in the  Left deltoid. Patient tolerated injection well.

## 2023-11-27 ENCOUNTER — Ambulatory Visit (INDEPENDENT_AMBULATORY_CARE_PROVIDER_SITE_OTHER): Admitting: Orthopedic Surgery

## 2023-11-27 ENCOUNTER — Encounter: Payer: Self-pay | Admitting: Orthopedic Surgery

## 2023-11-27 DIAGNOSIS — S83242D Other tear of medial meniscus, current injury, left knee, subsequent encounter: Secondary | ICD-10-CM

## 2023-11-27 NOTE — Progress Notes (Signed)
 Orthopaedic Postop Note  Assessment: Cynthia Chavez is a 68 y.o. female s/p left knee arthroscopy, with medial meniscus, posterior root repair  DOS: 09/03/2023  Plan: [name] has done very well.  She has good range of motion.  She still has some atrophy of the left quadriceps.  She has returned to many of her activities.  Continue to get stronger.  I think this will improve her swelling and residual discomfort.  She states understanding.  She will follow-up in approximate 2 months.   Follow-up: Return in about 2 months (around 01/27/2024). XR at next visit: None  Subjective:  Chief Complaint  Patient presents with   Routine Post Op    L KA DOS: 09/03/2023    History of Present Illness: Cynthia Chavez is a 69 y.o. female who presents following the above stated procedure.  Surgery was approximately 3 months ago.  She has done quite well.  She is ambulating without assistive device.  Tylenol  occasionally.  She has been doing some work in her garden.  She does continue to have some residual swelling, and notes some pain in the proximal aspect of her knee.  The pain she had prior to surgery is much better.  Review of Systems: No fevers or chills No numbness or tingling No Chest Pain No shortness of breath   Objective: There were no vitals taken for this visit.  Physical Exam:  Left knee surgical incisions have healed.  No surrounding erythema or drainage.  Mild residual swelling, most notable in the proximal aspect of the knee.  She has atrophy of the left quadriceps.  She is able to achieve full extension.  She tolerates flexion to 110 degrees.  No increased laxity to varus or valgus stress.  Negative Lachman.  IMAGING: I personally ordered and reviewed the following images:  No new imaging obtained today.   Tonita Frater, MD 11/27/2023 10:52 AM

## 2023-12-06 ENCOUNTER — Inpatient Hospital Stay: Payer: Medicare HMO | Attending: Hematology and Oncology | Admitting: Hematology and Oncology

## 2023-12-06 DIAGNOSIS — Z170 Estrogen receptor positive status [ER+]: Secondary | ICD-10-CM

## 2023-12-06 DIAGNOSIS — C50411 Malignant neoplasm of upper-outer quadrant of right female breast: Secondary | ICD-10-CM

## 2023-12-06 NOTE — Progress Notes (Signed)
 HEMATOLOGY-ONCOLOGY TELEPHONE VISIT PROGRESS NOTE  I connected with our patient on 12/06/23 at  9:30 AM EDT by telephone and verified that I am speaking with the correct person using two identifiers.  I discussed the limitations, risks, security and privacy concerns of performing an evaluation and management service by telephone and the availability of in person appointments.  I also discussed with the patient that there may be a patient responsible charge related to this service. The patient expressed understanding and agreed to proceed.   History of Present Illness: Surveillance of breast cancer, complains of hair loss  History of Present Illness Cynthia Chavez is a 68 year old female with breast cancer who presents with knee pain and hair thinning.  She experiences hair thinning at the front of her scalp, describing it as 'loose' and not growing. She started a supplement a month ago to promote hair growth. She is concerned that letrozole , taken since 2018 for breast cancer prevention, might contribute to her hair thinning. She is approximately one year from completing this treatment.  Knee pain has worsened recently, especially at night. She takes medication prescribed post-surgery, which she believes contains Tylenol , to manage the pain.    Oncology History  Breast cancer of upper-outer quadrant of right female breast (HCC)  07/24/2016 Mammogram   Rt Breast 2 masses at 11-12:30 Position spanning 8 cm; Additional mass at 2 o clock: 1.5 cm; LN with cort thickening 2.7 cm   07/27/2016 Initial Diagnosis   Rt Biopsy 11oclock and 2 o clock: Grade 3 IDC with LVI; LN biopsy: Positive IDC; 11oclock: Er 100%, PR 90%, Ki 67: 90%; Her 2 Neg Heterogeneous; LN: Er 100%, PR: 90%; Ki67: 60%; Her 2 Pos Ratio 2.37; 2oclock: Er 100%, PR 90%; Ki 67: 90%; Her 2 Positive 2.52   08/16/2016 - 12/01/2016 Neo-Adjuvant Chemotherapy   Neoadjuvant chemotherapy with [facility] Perjeta  (Taxotere  d/ced after 2 cycles)    12/06/2016 Breast MRI   Residual right breast 2 o'clock enhancing mass which now measures 1.3 cm in greatest dimension.Residual non measurable scattered foci of heterogeneous enhancement throughout the superior and subareolar right breast, anterior and middle depth. Diminished right axillary lymphadenopathy   01/03/2017 Surgery   Right mastectomy: Multiple IDC with micropapillary features, LVI, grade 3, 1.5, 0.6, 0.5 cm, 12/20 lymph nodes positive for cancer, margins negative, ER 100%, PR 90%, HER-2 positive, Ki-67 60%,ypT1c ypN3 RCB class III; stage IIIB   02/12/2017 - 03/26/2017 Radiation Therapy   Adjuvant radiation therapy   04/06/2017 -  Anti-estrogen oral therapy   Anastrozole  1 mg by mouth daily, could not tolerate Neratinib ; stopped 10/03/18 due to severe myalgias, switched to tamoxifen  started 10/17/18     REVIEW OF SYSTEMS:   Constitutional: Denies fevers, chills or abnormal weight loss All other systems were reviewed with the patient and are negative. Observations/Objective:     Assessment Plan:  Breast cancer of upper-outer quadrant of right female breast (HCC) Rt Biopsy 11oclock and 2 o clock: Grade 3 IDC with LVI; LN biopsy: Positive IDC; 11oclock: Er 100%, PR 90%, Ki 67: 90%; Her 2 Neg Heterogeneous; LN: Er 100%, PR: 90%; Ki67: 60%; Her 2 Pos Ratio 2.37; 2oclock: Er 100%, PR 90%; Ki 67: 90%; Her 2 Positive 2.52 Breast MRI on 08/13/2016: 8 cm span of breast cancer with multiple enlarged axillary lymph nodes. CT CAP: 08/17/16: 4 mm right middle lobe pulmonary nodule unlikely to be metastatic disease Bone scan 08/17/2016: No evidence of metastatic disease   Echocardiogram performed on 05/17/2017  showed a left ventricular ejection fraction of 60-65%.   Treatment summary:  1. Neoadjuvant chemotherapy with [facility] Perjeta  6 cycles given 08/16/16 to 12/01/16 followed by Herceptin  Perjeta  maintenance for 1 year  2. Followed by mastectomy with axillary node dissection 01/03/2017 3. Adjuvant  radiation therapy completed 03/26/2017 4. Followed by Anti-estrogen therapy started 04/06/2017 5. Followed by Neratinib  for 1 year started 09/07/2017 -------------------------------------------------------------------------------------------------------------------------------------------------------------------------------------------------------------------------------------- 01/04/2007 Right mastectomy: Multiple IDC with micropapillary features, LVI, grade 3, 1.5, 0.6, 0.5 cm, 12/20 lymph nodes positive for cancer, margins negative, ER 100%, PR 90%, HER-2 positive, Ki-67 60%,ypT1c ypN3 RCB class III; stage IIIB   Adjuvant radiation therapy started 02/12/2017 completed 03/26/2017   Current treatment: Neratinib  (started 09/01/17 discontinued 10/04/2017) and Anastrozole  started 04/06/17 switched to tamoxifen  10/03/2018 switched to letrozole  due to DVT December 2022 discontinued 03/2024     History of DVT and PE December 2022: I discontinued tamoxifen  and switching her back to letrozole .  We discontinue letrozole  because she was experiencing profound hair loss.   Breast cancer surveillance: 1.  Left breast mammogram: 10/09/2023 benign breast density category B 2.  Breast exam: Benign 12/06/2022 3.  CT chest performed for lung nodule evaluation 07/25/2021: 4 mm lung nodule  Return to clinic on an as-needed basis     I discussed the assessment and treatment plan with the patient. The patient was provided an opportunity to ask questions and all were answered. The patient agreed with the plan and demonstrated an understanding of the instructions. The patient was advised to call back or seek an in-person evaluation if the symptoms worsen or if the condition fails to improve as anticipated.   I provided 20 minutes of non-face-to-face time during this encounter.  This includes time for charting and coordination of care   Margert Sheerer, MD

## 2023-12-06 NOTE — Assessment & Plan Note (Signed)
 Rt Biopsy 11oclock and 2 o clock: Grade 3 IDC with LVI; LN biopsy: Positive IDC; 11oclock: Er 100%, PR 90%, Ki 67: 90%; Her 2 Neg Heterogeneous; LN: Er 100%, PR: 90%; Ki67: 60%; Her 2 Pos Ratio 2.37; 2oclock: Er 100%, PR 90%; Ki 67: 90%; Her 2 Positive 2.52 Breast MRI on 08/13/2016: 8 cm span of breast cancer with multiple enlarged axillary lymph nodes. CT CAP: 08/17/16: 4 mm right middle lobe pulmonary nodule unlikely to be metastatic disease Bone scan 08/17/2016: No evidence of metastatic disease   Echocardiogram performed on 05/17/2017 showed a left ventricular ejection fraction of 60-65%.   Treatment summary:  1. Neoadjuvant chemotherapy with [facility] Perjeta  6 cycles given 08/16/16 to 12/01/16 followed by Herceptin  Perjeta  maintenance for 1 year  2. Followed by mastectomy with axillary node dissection 01/03/2017 3. Adjuvant radiation therapy completed 03/26/2017 4. Followed by Anti-estrogen therapy started 04/06/2017 5. Followed by Neratinib  for 1 year started 09/07/2017 -------------------------------------------------------------------------------------------------------------------------------------------------------------------------------------------------------------------------------------- 01/04/2007 Right mastectomy: Multiple IDC with micropapillary features, LVI, grade 3, 1.5, 0.6, 0.5 cm, 12/20 lymph nodes positive for cancer, margins negative, ER 100%, PR 90%, HER-2 positive, Ki-67 60%,ypT1c ypN3 RCB class III; stage IIIB   Adjuvant radiation therapy started 02/12/2017 completed 03/26/2017   Current treatment: Neratinib  (started 09/01/17 discontinued 10/04/2017) and Anastrozole  started 04/06/17 switched to tamoxifen  10/03/2018 switched to letrozole  due to DVT December 2022   Letrozole  toxicities: Sweet cravings Plan is keep her on it till Aug 2026   History of DVT and PE December 2022: I discontinued tamoxifen  and switching her back to letrozole .   Breast cancer surveillance: 1.  Left  breast mammogram: 10/09/2023 benign breast density category B 2.  Breast exam: Benign 12/06/2022 3.  CT chest performed for lung nodule evaluation 07/25/2021: 4 mm lung nodule  Return to clinic in 1 year for follow-up

## 2024-01-29 ENCOUNTER — Ambulatory Visit: Admitting: Orthopedic Surgery

## 2024-03-28 ENCOUNTER — Encounter: Payer: Self-pay | Admitting: Radiology

## 2024-06-09 ENCOUNTER — Encounter: Payer: Self-pay | Admitting: Radiology

## 2024-07-19 ENCOUNTER — Observation Stay (HOSPITAL_COMMUNITY)
Admission: EM | Admit: 2024-07-19 | Discharge: 2024-07-20 | Disposition: A | Discharge status: Home / Self Care | Attending: Emergency Medicine | Admitting: Emergency Medicine

## 2024-07-19 ENCOUNTER — Other Ambulatory Visit: Payer: Self-pay

## 2024-07-19 ENCOUNTER — Encounter (HOSPITAL_COMMUNITY): Payer: Self-pay

## 2024-07-19 ENCOUNTER — Emergency Department (HOSPITAL_COMMUNITY)

## 2024-07-19 DIAGNOSIS — E039 Hypothyroidism, unspecified: Secondary | ICD-10-CM | POA: Diagnosis present

## 2024-07-19 DIAGNOSIS — I2699 Other pulmonary embolism without acute cor pulmonale: Secondary | ICD-10-CM | POA: Diagnosis present

## 2024-07-19 DIAGNOSIS — C50411 Malignant neoplasm of upper-outer quadrant of right female breast: Secondary | ICD-10-CM

## 2024-07-19 LAB — CBC WITH DIFFERENTIAL/PLATELET
Abs Immature Granulocytes: 0.03 K/uL (ref 0.00–0.07)
Basophils Absolute: 0 K/uL (ref 0.0–0.1)
Basophils Relative: 0 %
Eosinophils Absolute: 0.1 K/uL (ref 0.0–0.5)
Eosinophils Relative: 2 %
HCT: 40.1 % (ref 36.0–46.0)
Hemoglobin: 13.1 g/dL (ref 12.0–15.0)
Immature Granulocytes: 0 %
Lymphocytes Relative: 16 %
Lymphs Abs: 1.4 K/uL (ref 0.7–4.0)
MCH: 31 pg (ref 26.0–34.0)
MCHC: 32.7 g/dL (ref 30.0–36.0)
MCV: 95 fL (ref 80.0–100.0)
Monocytes Absolute: 0.5 K/uL (ref 0.1–1.0)
Monocytes Relative: 6 %
Neutro Abs: 6.7 K/uL (ref 1.7–7.7)
Neutrophils Relative %: 76 %
Platelets: 201 K/uL (ref 150–400)
RBC: 4.22 MIL/uL (ref 3.87–5.11)
RDW: 13 % (ref 11.5–15.5)
WBC: 8.7 K/uL (ref 4.0–10.5)
nRBC: 0 % (ref 0.0–0.2)

## 2024-07-19 LAB — BASIC METABOLIC PANEL WITH GFR
Anion gap: 13 (ref 5–15)
BUN: 14 mg/dL (ref 8–23)
CO2: 21 mmol/L — ABNORMAL LOW (ref 22–32)
Calcium: 9 mg/dL (ref 8.9–10.3)
Chloride: 106 mmol/L (ref 98–111)
Creatinine, Ser: 0.57 mg/dL (ref 0.44–1.00)
GFR, Estimated: >60 mL/min (ref >60)
Glucose, Bld: 117 mg/dL — ABNORMAL HIGH (ref 70–99)
Potassium: 3.8 mmol/L (ref 3.5–5.1)
Sodium: 140 mmol/L (ref 135–145)

## 2024-07-19 MED ORDER — LACTATED RINGERS IV SOLN: INTRAVENOUS | Status: DC

## 2024-07-19 MED ORDER — ONDANSETRON 4 MG PO TBDP
4.0000 mg | ORAL_TABLET | Freq: Once | ORAL | Status: AC
  Administered 2024-07-19: 4 mg via ORAL
  Filled 2024-07-19: qty 1

## 2024-07-19 MED ORDER — ONDANSETRON HCL 4 MG PO TABS: 4.0000 mg | ORAL_TABLET | Freq: Four times a day (QID) | ORAL | Status: DC | PRN

## 2024-07-19 MED ORDER — ONDANSETRON HCL 4 MG/2ML IJ SOLN
4.0000 mg | Freq: Four times a day (QID) | INTRAMUSCULAR | Status: DC | PRN
  Administered 2024-07-19: 4 mg via INTRAVENOUS
  Filled 2024-07-19: qty 2

## 2024-07-19 MED ORDER — GLUCAGON HCL RDNA (DIAGNOSTIC) 1 MG IJ SOLR
1.0000 mg | Freq: Once | INTRAMUSCULAR | Status: AC
  Administered 2024-07-19: 1 mg via INTRAMUSCULAR
  Filled 2024-07-19: qty 1

## 2024-07-19 MED ORDER — PANTOPRAZOLE SODIUM 40 MG IV SOLR
40.0000 mg | Freq: Two times a day (BID) | INTRAVENOUS | Status: DC
  Administered 2024-07-19: 40 mg via INTRAVENOUS
  Filled 2024-07-19 (×2): qty 10

## 2024-07-19 MED ORDER — ONDANSETRON HCL 4 MG/2ML IJ SOLN
4.0000 mg | Freq: Once | INTRAMUSCULAR | Status: DC
  Filled 2024-07-19: qty 2

## 2024-07-19 NOTE — ED Triage Notes (Signed)
 Patient arrives POV from home c/c vomiting clear liquid. Began this am when she attempted to eat solid food and felt like she was choking/could not swallow the food and spat it back out. Since that time, she cannot keep even water down. Denies nausea and diarrhea.

## 2024-07-19 NOTE — ED Provider Notes (Signed)
 [place] EMERGENCY DEPARTMENT AT [facility] Provider Note   CSN: [id number] Arrival date & time: 07/19/24  1551     Patient presents with: Vomiting   Cynthia Chavez is a 68 y.o. female with a history of hiatal hernia, GERD, and esophageal dilation who presents to the ED with dysphagia with possible food bolus impaction that began this morning around 0900. The patient describes the symptoms as throat irritation and feeling like she has something stuck in her esophagus - however patient denies any choking episodes.  Patient states that she has tried to make herself vomit several times since the incident without relief of impaction feeling. Patient states that she has also trialed liquids and some mashed potatoes and is unable to tolerate same not vomiting.  Patient denies any associated symptoms include nausea, diarrhea, fevers. The patient reports no shortness of breath or difficulty breathing.  Patient states that she is still able to tolerate secretions. No recent travel. No sick contacts.   HPI     Prior to Admission medications  Medication Sig Start Date End Date Taking? Authorizing Provider  esomeprazole  (NEXIUM ) 20 MG capsule Take 20 mg by mouth daily as needed (acid reflux).    [provider]  levothyroxine  (SYNTHROID ) 150 MCG tablet TAKE 1 TABLET BY MOUTH ONCE DAILY BEFORE BREAKFAST, EXCEPT FOR SUNDAY. OMIT SUNDAY DOSE 08/17/23   MilianMarry Lenis, FNP    Allergies: Bovine (beef) protein-containing drug products, Ivp dye [iodinated contrast media], Other, Porcine (pork) protein-containing drug products, Augmentin  [amoxicillin -pot clavulanate], Dilaudid  [hydromorphone  hcl], and Codeine    Review of Systems  HENT:  Positive for trouble swallowing.     Updated Vital Signs BP (!) 146/91   Pulse 77   Temp 98.7 F (37.1 C) (Oral)   Resp 16   Ht 5' 7 (1.702 m)   Wt 81.6 kg   SpO2 93%   BMI 28.19 kg/m   Physical Exam Vitals and nursing note  reviewed.  Constitutional:      General: She is not in acute distress.    Appearance: Normal appearance.  HENT:     Head: Normocephalic and atraumatic.     Mouth/Throat:     Mouth: Mucous membranes are moist.     Pharynx: Oropharynx is clear. Uvula midline. Posterior oropharyngeal erythema present. No pharyngeal swelling.     Comments: Mildly erythematous oropharynx.  There is no foreign body visualized. Phonation is notably hoarse - patient states that this started after the vomiting/coughing episodes. Eyes:     Extraocular Movements: Extraocular movements intact.     Conjunctiva/sclera: Conjunctivae normal.     Pupils: Pupils are equal, round, and reactive to light.  Cardiovascular:     Rate and Rhythm: Normal rate and regular rhythm.     Pulses: Normal pulses.  Pulmonary:     Effort: Pulmonary effort is normal. No respiratory distress.     Breath sounds: Normal breath sounds. No stridor or decreased air movement.     Comments: She has no difficulty speaking complete sentences. Abdominal:     General: Abdomen is flat.     Palpations: Abdomen is soft.     Tenderness: There is no abdominal tenderness.     Comments: No abdominal tenderness on palpation  Musculoskeletal:        General: Normal range of motion.     Cervical back: Normal range of motion.  Skin:    General: Skin is warm and dry.     Capillary Refill: Capillary  refill takes less than 2 seconds.  Neurological:     General: No focal deficit present.     Mental Status: She is alert. Mental status is at baseline.  Psychiatric:        Mood and Affect: Mood normal.     (all labs ordered are listed, but only abnormal results are displayed) Labs Reviewed  BASIC METABOLIC PANEL WITH GFR - Abnormal; Notable for the following components:      Result Value   CO2 21 (*)    Glucose, Bld 117 (*)    All other components within normal limits  CBC WITH DIFFERENTIAL/PLATELET  HIV ANTIBODY (ROUTINE TESTING W REFLEX)     EKG: None  Radiology: No results found.   Procedures   Medications Ordered in the ED  lactated ringers  infusion ( Intravenous New Bag/Given 07/19/24 1940)  pantoprazole  (PROTONIX ) injection 40 mg (40 mg Intravenous Given 07/19/24 2051)  ondansetron  (ZOFRAN ) tablet 4 mg ( Oral See Alternative 07/19/24 2051)    Or  ondansetron  (ZOFRAN ) injection 4 mg (4 mg Intravenous Given 07/19/24 2051)  glucagon  (human recombinant) (GLUCAGEN ) injection 1 mg (1 mg Intramuscular Given 07/19/24 1741)  ondansetron  (ZOFRAN -ODT) disintegrating tablet 4 mg (4 mg Oral Given 07/19/24 1747)                                  Medical Decision Making Amount and/or Complexity of Data Reviewed Labs: ordered.  Risk Prescription drug management. Decision regarding hospitalization.   Patient presents to the ED for: dysphagia This involves an extensive number of treatment options Differential diagnosis includes: Esophageal stricture with possible food bolus obstruction vs inflammation   Infectious etiology Co-morbid conditions: GERD, hiatal hernia, esophageal stricture with dilation  Additional history/records obtained and reviewed: Additional history obtained from husband who is present as a good historian External records from outside source obtained and reviewed including outside medical records regarding patient's esophageal stricture with dilation.  Clinical Course as of 07/19/24 2141  Sat Jul 19, 2024  1647 Temp: 98.1 F (36.7 C) Afebrile, vital stable, patient in no acute distress. [ML]  1800 Trialed glucagon  for LES relaxation - patient stated she had no relief.  Patient still not able to tolerate PO, water was trailed and patient vomited after a few sips. Patient is still tolerating secretions at this time.  [ML]  1807 Spoke with GI who stated that they would talk with anesthesia about seeing her tonight or tomorrow.  [ML]  1808 Labs ordered per GI consult -  [ML]  1818 Initial Zofran   dose did not relieve nausea-IV nausea placed for symptomatic relief [ML]  2050 Spoke with hospitalist-to admit [ML]  2122 Basic metabolic panel(!) No acute findings [ML]  2122 CBC with Differential WNL [ML]    Clinical Course User Index [ML] Willma Duwaine CROME, PA    Data Reviewed / Actions Taken: Labs ordered/reviewed with my independent interpretation in ED course above. Imaging ordered/reviewed with my independent interpretation in ED course above. I agree with the radiologists interpretation.   Management / Treatments: See ED course above for medications, treatments administered, and clinical rationale.   Reevaluation of the patient after these medicines showed that the patient no improvement with glucagon  administration. I have reviewed the patients home medicines and have made adjustments as needed  ED Course / Reassessments: Problem List: Dysphagia 68 year old female presented for dysphagia with concern of esophageal impaction. Given patient's physical exam findings, successful glucagon   treatment, and patient's history of stricture with associated dilation, her discomfort and inability to tolerate PO is most likely stricture related with possible associated food bolus, however given the amount of time since patient ate solid foods, it is more likely that the dysphagia is related to inflammation rather than food bolus.  Patient is still able to tolerate secretions at the time of admission. Given patient's nausea is strictly related to the dysphagia-less likely an infectious etiology.  Patient to be admitted for further evaluation. Patient response: Did not improve with emergency department treatment regimen Serial reassessments performed: Yes    Consultations:  Gastroenterology - Dr. Eartha Consult recommendations incorporated into plan: Patient to be admitted and Dr. Eartha will meet with her in the morning for further evaluation and care. Hospitalist -Dr. Janyth Consult  recommendations incorporated into plan: Hospital admission for further evaluation by gastroenterology.    Disposition: Disposition: Admission for further evaluation and care of ongoing dysphagia. Rationale for disposition: Unable to tolerate clear liquids, failed glucagon  treatment in the emergency department. The disposition plan and rationale were discussed with the patient at the bedside, all questions were addressed, and the patient demonstrated understanding.  This note was produced using [employer]. While I have reviewed and verified all clinical information, transcription errors may remain.      Final diagnoses:  None    ED Discharge Orders     None          Willma Duwaine CROME, [place] 07/19/24 2142    Cleotilde Rogue, MD 07/20/24 [phone]

## 2024-07-19 NOTE — H&P (Signed)
 History and Physical    Patient: Cynthia Chavez FMW:[id number] DOB: 1956/05/03 DOA: 07/19/2024 DOS: the patient was seen and examined on 07/19/2024 PCP: Jolinda Norene HERO, DO  Patient coming from: Home  Chief Complaint:  Chief Complaint  Patient presents with   Vomiting   HPI: Cynthia Chavez is a 68 y.o. female with medical history significant of esophageal stricture with previous dilation, hypothyroidism, GERD. Patient has been intolerant of oral foods and liquids all day.  She tried to eat earlier and it felt like the food was caught in her throat.  She has been vomiting due to that sensation.  She denies fevers, chills, nausea, diarrhea. No chest pain, SOB, difficulty walking.   She came to the ED for evaluation. Here, she received glucagon  and zofran , which were not effective. She is currently able to tolerate her secretions. GI was consulted who requested admission.  Review of Systems: As mentioned in the history of present illness. All other systems reviewed and are negative. Past Medical History:  Diagnosis Date   Anxiety    Breast cancer (HCC)    Cancer (HCC)    breast cancer   Depression    Edema    bilateral feet and leg swelling   GERD (gastroesophageal reflux disease)    History of hiatal hernia    History of radiation therapy 02/12/17- 03/26/17   Chest wall and regional nodes, right- 50 Gy in 25 fractions, Chest Wall, boost, right- 10 Gy in 5 fractions.    Hypothyroidism    Malaise    Personal history of chemotherapy    Personal history of radiation therapy    Port catheter in place 09/08/2016   Sprain of shoulder, right 11/23/2020   Thyroid  disease    Varicose veins    Past Surgical History:  Procedure Laterality Date   ABDOMINAL HYSTERECTOMY     AXILLARY LYMPH NODE BIOPSY Right 01/03/2017   Procedure: RIGHT AXILLARY LYMPH NODE SEED GUIDED EXCISIONAL BIOPSY;  Surgeon: Ebbie Cough, MD;  Location: [facility] OR;  Service: General;  Laterality: Right;   BREAST SURGERY      CHOLECYSTECTOMY     COLONOSCOPY     ESOPHAGEAL DILATION     KNEE ARTHROSCOPY WITH MENISCAL REPAIR Left 09/03/2023   Procedure: LEFT KNEE ARTHROSCOPY WITH POSTERIOR ROOT MENISCAL REPAIR;  Surgeon: Onesimo Oneil LABOR, MD;  Location: [facility];  Service: Orthopedics;  Laterality: Left;   MASTECTOMY Right    MASTECTOMY W/ SENTINEL NODE BIOPSY Right 01/03/2017   axillary   MASTECTOMY W/ SENTINEL NODE BIOPSY Right 01/03/2017   Procedure: RIGHT TOTAL MASTECTOMY WITH RIGHT AXILLARY SENTINEL LYMPH NODE BIOPSY;  Surgeon: Ebbie Cough, MD;  Location: [facility] OR;  Service: General;  Laterality: Right;   PORTACATH PLACEMENT Left 08/17/2016   Procedure: INSERTION PORT-A-CATH WITH US ;  Surgeon: Cough Ebbie, MD;  Location: [facility];  Service: General;  Laterality: Left;   Social History:  reports that she has been smoking cigarettes. She started smoking about 31 years ago. She has a 15.6 pack-year smoking history. She has never used smokeless tobacco. She reports that she does not drink alcohol and does not use drugs.  Allergies[1]  Family History  Problem Relation Age of Onset   COPD Father    Cancer Father     Prior to Admission medications  Medication Sig Start Date End Date Taking? Authorizing Provider  esomeprazole  (NEXIUM ) 20 MG capsule Take 20 mg by mouth daily as needed (acid reflux).    [provider]  levothyroxine  (SYNTHROID ) 150 MCG tablet TAKE 1 TABLET BY MOUTH ONCE DAILY BEFORE BREAKFAST, EXCEPT FOR SUNDAY. OMIT SUNDAY DOSE 08/17/23   Cathlene Marry Lenis, FNP    Physical Exam: Vitals:   07/19/24 1556 07/19/24 1556 07/19/24 1557 07/19/24 1600  BP:  (!) 155/131  134/86  Pulse:  96  94  Resp:  20  16  Temp: 98.1 F (36.7 C) 98.1 F (36.7 C)  98.1 F (36.7 C)  TempSrc: Tympanic   Tympanic  SpO2:  100%  100%  Weight:   81.6 kg   Height:   5' 7 (1.702 m)    General: Elderly female. Awake and alert and oriented x3. No acute cardiopulmonary distress.   HEENT: Normocephalic atraumatic.  Right and left ears normal in appearance.  Pupils equal, round, reactive to light. Extraocular muscles are intact. Sclerae anicteric and noninjected.  Moist mucosal membranes. No mucosal lesions.  Neck: Neck supple without lymphadenopathy. No carotid bruits. No masses palpated.  Cardiovascular: Regular rate with normal S1-S2 sounds. No murmurs, rubs, gallops auscultated. No JVD.  Respiratory: Good respiratory effort with no wheezes, rales, rhonchi. Lungs clear to auscultation bilaterally.  No accessory muscle use. Abdomen: Soft, nontender, nondistended. Active bowel sounds. No masses or hepatosplenomegaly  Skin: No rashes, lesions, or ulcerations.  Dry, warm to touch. 2+ dorsalis pedis and radial pulses. Musculoskeletal: No calf or leg pain. All major joints not erythematous nontender.  No upper or lower joint deformation.  Good ROM.  No contractures  Psychiatric: Intact judgment and insight. Pleasant and cooperative. Neurologic: No focal neurological deficits. Strength is 5/5 and symmetric in upper and lower extremities.  Cranial nerves II through XII are grossly intact.  Data Reviewed: Labs pending  Assessment and Plan: No notes have been filed under this hospital service. Service: Hospitalist  Principal Problem:   Esophageal stricture Active Problems:   Bilateral pulmonary embolism and Rt Leg DVT - Rt Femoral, popliteal, posterior tibial and Peroneal veins   Hypothyroidism  Esophageal stricture with intolerance of oral food and liquid. Will hold n.p.o. IV fluids to prevent dehydration IV Protonix  IV Zofran  as needed CBC and CMP pending Will consider glycopyrrolate  to help dry secretions if necessary GI consulted with plans for dilation tomorrow History of pulmonary embolism with right DVT Not currently on anticoagulation SCDs while pending procedure Hypothyroidism Will hold levothyroxine  and restart as soon as possible following procedure    Advance Care Planning:   Code Status: Full Code confirmed by patient  Consults: GI  Family Communication: Son present during interview and exam  Severity of Illness: The appropriate patient status for this patient is OBSERVATION. Observation status is judged to be reasonable and necessary in order to provide the required intensity of service to ensure the patient's safety. The patient's presenting symptoms, physical exam findings, and initial radiographic and laboratory data in the context of their medical condition is felt to place them at decreased risk for further clinical deterioration. Furthermore, it is anticipated that the patient will be medically stable for discharge from the hospital within 2 midnights of admission.   Author: Judie Hollick J Serah Nicoletti, DO 07/19/2024 7:23 PM  For on call review [email or web address].     [1]  Allergies Allergen Reactions   Bovine (Beef) Protein-Containing Drug Products Hives, Shortness Of Breath, Swelling and Rash   Ivp Dye [Iodinated Contrast Media] Other (See Comments)    Chest pain   Other Shortness Of Breath, Swelling and Other (See Comments)    Versed  or Fentanyl  when  used for endoscopy caused throat and tongue swelling   Porcine (Pork) Protein-Containing Drug Products Hives, Shortness Of Breath and Swelling   Augmentin  [Amoxicillin -Pot Clavulanate]     Unknown reaction    Dilaudid  [Hydromorphone  Hcl]     PT had CP and felt shaky. Didn't like the feeling, but sx lessened w/in 10 min of IV administration   Codeine Palpitations    spasms

## 2024-07-20 ENCOUNTER — Encounter (HOSPITAL_COMMUNITY)
Admission: EM | Disposition: A | Discharge status: Home / Self Care | Payer: Self-pay | Source: Home / Self Care | Attending: Emergency Medicine

## 2024-07-20 ENCOUNTER — Other Ambulatory Visit: Payer: Self-pay

## 2024-07-20 ENCOUNTER — Telehealth (INDEPENDENT_AMBULATORY_CARE_PROVIDER_SITE_OTHER): Payer: Self-pay | Admitting: Gastroenterology

## 2024-07-20 ENCOUNTER — Encounter (HOSPITAL_COMMUNITY): Payer: Self-pay | Admitting: Family Medicine

## 2024-07-20 ENCOUNTER — Observation Stay (HOSPITAL_COMMUNITY): Admitting: Anesthesiology

## 2024-07-20 DIAGNOSIS — Z170 Estrogen receptor positive status [ER+]: Secondary | ICD-10-CM | POA: Diagnosis not present

## 2024-07-20 DIAGNOSIS — K222 Esophageal obstruction: Secondary | ICD-10-CM

## 2024-07-20 DIAGNOSIS — K259 Gastric ulcer, unspecified as acute or chronic, without hemorrhage or perforation: Secondary | ICD-10-CM

## 2024-07-20 DIAGNOSIS — R1310 Dysphagia, unspecified: Principal | ICD-10-CM

## 2024-07-20 DIAGNOSIS — C50411 Malignant neoplasm of upper-outer quadrant of right female breast: Secondary | ICD-10-CM

## 2024-07-20 SURGERY — EGD (ESOPHAGOGASTRODUODENOSCOPY)
Anesthesia: Monitor Anesthesia Care

## 2024-07-20 MED ORDER — PROPOFOL 500 MG/50ML IV EMUL
INTRAVENOUS | Status: AC
  Filled 2024-07-20: qty 50

## 2024-07-20 MED ORDER — ESOMEPRAZOLE MAGNESIUM 40 MG PO CPDR: 40.0000 mg | DELAYED_RELEASE_CAPSULE | Freq: Every day | ORAL | 1 refills | Status: AC

## 2024-07-20 MED ORDER — LACTATED RINGERS IV SOLN: INTRAVENOUS | Status: DC | PRN

## 2024-07-20 MED ORDER — PROPOFOL 10 MG/ML IV BOLUS
INTRAVENOUS | Status: DC | PRN
  Administered 2024-07-20: 200 mg via INTRAVENOUS

## 2024-07-20 NOTE — Progress Notes (Signed)
 CHG bath complete with a new gown on and socks. Pts consent form for her scheduled procedure has been signed by both the patient and this clinical research associate as a witness and has been placed in the patients chart.

## 2024-07-20 NOTE — Op Note (Signed)
 [facility] Patient Name: Cynthia Chavez Procedure Date: 07/20/2024 7:33 AM MRN: [id number] Date of Birth: 1955/09/25 Attending MD: Toribio Fortune , , [id number] CSN: [id number] Age: 68 Admit Type: Inpatient Procedure:                Upper GI endoscopy Indications:              Dysphagia Providers:                Toribio Fortune, Olam Ada, RN, Gordy Lonni Balm, Technician Referring MD:              Medicines:                Monitored Anesthesia Care Complications:            No immediate complications. Estimated Blood Loss:     Estimated blood loss: none. Procedure:                Pre-Anesthesia Assessment:                           - Prior to the procedure, a History and Physical                            was performed, and patient medications, allergies                            and sensitivities were reviewed. The patient's                            tolerance of previous anesthesia was reviewed.                           - The risks and benefits of the procedure and the                            sedation options and risks were discussed with the                            patient. All questions were answered and informed                            consent was obtained.                           - ASA Grade Assessment: II - A patient with mild                            systemic disease.                           After obtaining informed consent, the endoscope was                            passed under direct vision. Throughout the  procedure, the patient's blood pressure, pulse, and                            oxygen  saturations were monitored continuously. The                            [id number] ([id number]) Upper was introduced through                            the mouth, and advanced to the second part of                            duodenum. The upper GI endoscopy was accomplished                             without difficulty. The patient tolerated the                            procedure well. Scope In: 8:16:17 AM Scope Out: 8:23:04 AM Total Procedure Duration: 0 hours 6 minutes 47 seconds  Findings:      One benign-appearing, intrinsic mild (non-circumferential scarring)       stenosis was found at the gastroesophageal junction. This stenosis       measured 1.6 cm (inner diameter) x 1 cm (in length). The stenosis was       traversed. A TTS dilator was passed through the scope. Dilation with a       15-16.5-18 mm balloon dilator was performed to 18 mm.      A 6 cm hiatal hernia with a few Cameron ulcers was found.      The entire examined stomach was normal.      The examined duodenum was normal. Impression:               - Benign-appearing esophageal stenosis. Dilated.                           - 6 cm hiatal hernia with a few Cameron ulcers.                           - Normal stomach.                           - Normal examined duodenum.                           - No specimens collected. Moderate Sedation:      Per Anesthesia Care Recommendation:           - Return patient to hospital ward for possible                            discharge same day.                           - Resume previous diet.                           -  Use Prilosec (omeprazole ) 40 mg PO daily.                           - Return to GI clinic in 4 weeks. Procedure Code(s):        --- Professional ---                           [phone], Esophagogastroduodenoscopy, flexible,                            transoral; with transendoscopic balloon dilation of                            esophagus (less than 30 mm diameter) Diagnosis Code(s):        --- Professional ---                           K22.2, Esophageal obstruction                           K44.9, Diaphragmatic hernia without obstruction or                            gangrene                           K25.9, Gastric ulcer, unspecified as acute or                             chronic, without hemorrhage or perforation                           R13.10, Dysphagia, unspecified CPT copyright 2022 American Medical Association. All rights reserved. The codes documented in this report are preliminary and upon coder review may  be revised to meet current compliance requirements. Toribio Fortune, MD Toribio Fortune,  07/20/2024 8:40:22 AM This report has been signed electronically. Number of Addenda: 0

## 2024-07-20 NOTE — Telephone Encounter (Signed)
 Hi Devere,  Can you please schedule a follow up appointment for this patient in 4  weeks with any provider?  Thanks,  Toribio Fortune, MD Gastroenterology and Hepatology [facility] Gastroenterology

## 2024-07-20 NOTE — Progress Notes (Signed)
 Patient has been NPO since arriving to this unit. Pt vomited once earlier in the shift and was given IV Zofran  with effective results. Pt now has orders in for her procedure scheduled for today which includes an EGD to be performed. Informed consent was obtained by this clinical research associate and this clinical research associate reviewed over consent form with the patient and answered any questions that the patient had. Pt has no questions at this time. Pt is being provided with CHG wipes and a clean gown along with clean socks so she will be ready to go for her procedure scheduled this am. Pt understands that she has to wear a hospital gown and socks but request to leave her bra on since it has her prosthesis breast in it.

## 2024-07-20 NOTE — TOC CM/SW Note (Signed)
 Transition of Care [facility]) - Inpatient Brief Assessment   Patient Details  Name: Cynthia Chavez MRN: [id number] Date of Birth: 11/12/1955  Transition of Care [facility]) CM/SW Contact:    Noreen KATHEE Cleotilde ISRAEL Phone Number: 07/20/2024, 8:41 AM   Clinical Narrative:  Inpatient Care Management (ICM) has reviewed patient and no other ICM needs have been identified at this time. We will continue to monitor patient advancement through interdisciplinary progression rounds. If new patient transition needs arise, please place a ICM consult.  Transition of Care Asessment: Insurance and Status: Insurance coverage has been reviewed Patient has primary care physician: Yes Home environment has been reviewed: From Home Prior level of function:: Independent Prior/Current Home Services: No current home services Social Drivers of Health Review: SDOH reviewed interventions complete Readmission risk has been reviewed: Yes Transition of care needs: no transition of care needs at this time

## 2024-07-20 NOTE — Brief Op Note (Addendum)
 07/20/2024  8:30 AM  PATIENT:  Cynthia Chavez  68 y.o. female  PRE-OPERATIVE DIAGNOSIS:  food impaction  POST-OPERATIVE DIAGNOSIS:  * No post-op diagnosis entered *  PROCEDURE:  Procedures: EGD (ESOPHAGOGASTRODUODENOSCOPY) (N/A)  SURGEON:  Surgeons and Role:    * Eartha Angelia Sieving, MD - Primary  Patient underwent EGD under propofol  sedation.  Tolerated the procedure adequately.   FINDINGS: - Benign-appearing esophageal stenosis.  Dilated.  - 6 cm hiatal hernia with a few Cameron ulcers.  - Normal stomach.  - Normal examined duodenum.   RECOMMENDATIONS - Return patient to hospital ward for possible discharge same day.  - Resume previous diet.  - Use Prilosec (omeprazole ) 40 mg PO daily.  - Return to GI clinic in 4 weeks.  - GI service will sign-off, please call us  back if you have any more questions.  Sieving Eartha, MD Gastroenterology and Hepatology [facility] Gastroenterology

## 2024-07-20 NOTE — Consult Note (Addendum)
 Toribio Fortune, M.D. Gastroenterology & Hepatology                                           Patient Name: Cynthia Chavez Account #: @FLAACCTNO @   MRN: [id number] Admission Date: 07/19/2024 Date of Evaluation:  07/20/2024 Time of Evaluation: 7:45 AM   Referring Physician: Alm Schneider, MD  Chief Complaint:  dysphagia and possible food impaction  HPI:  This is a 67 y.o. female with history of GERD, hiatus hernia, esophageal dilatation right breast cancer status post right mastectomy 12/2016, DVT/PE 07/2021, hypothyroidism, who comes to the hospital for evaluation after presenting an episode of acute dysphagia and possible food impaction. The patient reports she was eating chicken  yesterday morning and she felt the food got stuck in the retrosternal area. States she did not have previous episodes of dysphagia but felt liquids were not going down after this.  States regurgitating liquids multiple times after attempting to swallow them.  Drooling: No Able to swallow food: No Able to drink liquidsno Previous reflux symptoms: Occasional episodes, for which she took OTC Nexium  as needed Weight changes: No Seasonal allegies: No  Previous episodes: Denies this but had previous EGD for dysphagia  Medications currently used: Nexium  20 mg as needed  Previous EGD:07/2013 Stenosis at the GE junction dilated up to 18 mm  Previous colonoscopy: More than 10 years ago   In the ER, vital signs were stable.  She was protecting airway. Labs were notable for normal CBC and BMP.    Past Medical History: SEE CHRONIC ISSSUES: Past Medical History:  Diagnosis Date   Anxiety    Breast cancer (HCC)    Cancer (HCC)    breast cancer   Depression    Edema    bilateral feet and leg swelling   GERD (gastroesophageal reflux disease)    History of hiatal hernia    History of radiation therapy 02/12/17- 03/26/17   Chest wall and regional nodes, right- 50 Gy in 25 fractions, Chest Wall, boost, right- 10 Gy in  5 fractions.    Hypothyroidism    Malaise    Personal history of chemotherapy    Personal history of radiation therapy    Port catheter in place 09/08/2016   Sprain of shoulder, right 11/23/2020   Thyroid  disease    Varicose veins    Past Surgical History:  Past Surgical History:  Procedure Laterality Date   ABDOMINAL HYSTERECTOMY     AXILLARY LYMPH NODE BIOPSY Right 01/03/2017   Procedure: RIGHT AXILLARY LYMPH NODE SEED GUIDED EXCISIONAL BIOPSY;  Surgeon: Ebbie Cough, MD;  Location: [facility];  Service: General;  Laterality: Right;   BREAST SURGERY     CHOLECYSTECTOMY     COLONOSCOPY     ESOPHAGEAL DILATION     KNEE ARTHROSCOPY WITH MENISCAL REPAIR Left 09/03/2023   Procedure: LEFT KNEE ARTHROSCOPY WITH POSTERIOR ROOT MENISCAL REPAIR;  Surgeon: Onesimo Oneil LABOR, MD;  Location: [facility];  Service: Orthopedics;  Laterality: Left;   MASTECTOMY Right    MASTECTOMY W/ SENTINEL NODE BIOPSY Right 01/03/2017   axillary   MASTECTOMY W/ SENTINEL NODE BIOPSY Right 01/03/2017   Procedure: RIGHT TOTAL MASTECTOMY WITH RIGHT AXILLARY SENTINEL LYMPH NODE BIOPSY;  Surgeon: Ebbie Cough, MD;  Location: [facility];  Service: General;  Laterality: Right;   PORTACATH PLACEMENT Left 08/17/2016   Procedure: INSERTION PORT-A-CATH WITH US ;  Surgeon: Donnice Bury, MD;  Location: [facility];  Service: General;  Laterality: Left;   Family History:  Family History  Problem Relation Age of Onset   COPD Father    Cancer Father    Social History: Social History[1]  Home Medications:  Prior to Admission medications  Medication Sig Start Date End Date Taking? Authorizing Provider  esomeprazole  (NEXIUM ) 20 MG capsule Take 20 mg by mouth daily as needed (acid reflux).    [provider]  levothyroxine  (SYNTHROID ) 150 MCG tablet TAKE 1 TABLET BY MOUTH ONCE DAILY BEFORE BREAKFAST, EXCEPT FOR SUNDAY. OMIT SUNDAY DOSE 08/17/23   MilianMarry Lenis, FNP    Inpatient Medications: Current  Medications[2] Allergies: Bovine (beef) protein-containing drug products, Ivp dye [iodinated contrast media], Other, Porcine (pork) protein-containing drug products, Augmentin  [amoxicillin -pot clavulanate], Dilaudid  [hydromorphone  hcl], and Codeine  Complete Review of Systems: GENERAL: negative for malaise, night sweats HEENT: No changes in hearing or vision, no nose bleeds or other nasal problems. NECK: Negative for lumps, goiter, pain and significant neck swelling RESPIRATORY: Negative for cough, wheezing CARDIOVASCULAR: Negative for chest pain, leg swelling, palpitations, orthopnea GI: SEE HPI MUSCULOSKELETAL: Negative for joint pain or swelling, back pain, and muscle pain. SKIN: Negative for lesions, rash PSYCH: Negative for sleep disturbance, mood disorder and recent psychosocial stressors. HEMATOLOGY Negative for prolonged bleeding, bruising easily, and swollen nodes. ENDOCRINE: Negative for cold or heat intolerance, polyuria, polydipsia and goiter. NEURO: negative for tremor, gait imbalance, syncope and seizures. The remainder of the review of systems is noncontributory.  Physical Exam: BP 114/72 (BP Location: Left Arm)   Pulse 66   Temp 97.9 F (36.6 C) (Oral)   Resp 20   Ht 5' 7 (1.702 m)   Wt 83.7 kg   SpO2 93%   BMI 28.90 kg/m  GENERAL: The patient is AO x3, in no acute distress. HEENT: Head is normocephalic and atraumatic. EOMI are intact. Mouth is well hydrated and without lesions. NECK: Supple. No masses LUNGS: Clear to auscultation. No presence of rhonchi/wheezing/rales. Adequate chest expansion HEART: RRR, normal s1 and s2. ABDOMEN: Soft, nontender, no guarding, no peritoneal signs, and nondistended. BS +. No masses. EXTREMITIES: Without any cyanosis, clubbing, rash, lesions or edema. NEUROLOGIC: AOx3, no focal motor deficit. SKIN: no jaundice, no rashes  Laboratory Data CBC:     Component Value Date/Time   WBC 8.7 07/19/2024 1930   RBC 4.22 07/19/2024  1930   HGB 13.1 07/19/2024 1930   HGB 13.1 04/19/2023 1012   HGB 12.0 07/20/2017 0749   HCT 40.1 07/19/2024 1930   HCT 38.8 04/19/2023 1012   HCT 36.7 07/20/2017 0749   PLT 201 07/19/2024 1930   PLT 180 04/19/2023 1012   MCV 95.0 07/19/2024 1930   MCV 95 04/19/2023 1012   MCV 98.9 07/20/2017 0749   MCH 31.0 07/19/2024 1930   MCHC 32.7 07/19/2024 1930   RDW 13.0 07/19/2024 1930   RDW 12.2 04/19/2023 1012   RDW 13.2 07/20/2017 0749   LYMPHSABS 1.4 07/19/2024 1930   LYMPHSABS 1.4 04/19/2023 1012   LYMPHSABS 0.9 07/20/2017 0749   MONOABS 0.5 07/19/2024 1930   MONOABS 0.4 07/20/2017 0749   EOSABS 0.1 07/19/2024 1930   EOSABS 0.3 04/19/2023 1012   BASOSABS 0.0 07/19/2024 1930   BASOSABS 0.0 04/19/2023 1012   BASOSABS 0.0 07/20/2017 0749   COAG: No results found for: INR, PROTIME  BMP:     Latest Ref Rng & Units 07/19/2024    7:30 PM 08/29/2023  10:41 AM 04/19/2023   10:12 AM  BMP  Glucose 70 - 99 mg/dL 882  891  96   BUN 8 - 23 mg/dL 14  13  13    Creatinine 0.44 - 1.00 mg/dL 9.42  9.35  9.25   BUN/Creat Ratio 12 - 28   18   Sodium 135 - 145 mmol/L 140  140  142   Potassium 3.5 - 5.1 mmol/L 3.8  3.8  4.0   Chloride 98 - 111 mmol/L 106  107  105   CO2 22 - 32 mmol/L 21  25  21    Calcium 8.9 - 10.3 mg/dL 9.0  8.9  9.1     HEPATIC:     Latest Ref Rng & Units 04/19/2023   10:12 AM 04/18/2022    3:37 PM 08/10/2021    9:42 AM  Hepatic Function  Total Protein 6.0 - 8.5 g/dL 6.4  6.4  6.3   Albumin  3.9 - 4.9 g/dL 4.1  4.0  3.9   AST 0 - 40 IU/L 12  12  15    ALT 0 - 32 IU/L 9  10  16    Alk Phosphatase 44 - 121 IU/L 115  110  79   Total Bilirubin 0.0 - 1.2 mg/dL 0.6  0.4  0.3     CARDIAC: No results found for: CKTOTAL, CKMB, CKMBINDEX, TROPONINI   Imaging: I personally reviewed and interpreted the available imaging.  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 The patient has not presented any previous red flag signs. DDx includes possible peptic strictures vs less likely EoE . For now, we'll give to keep the patient nothing by mouth until the procedure is performed.   - Keep NPO - Emergent EGD - Anesthesia evaluation for general anesthesia   Toribio Fortune, MD Gastroenterology and Hepatology [facility]     [1]  Social History Tobacco Use   Smoking status: Some Days    Current packs/day: 0.50    Average packs/day: 0.5 packs/day for 31.3 years (15.6 ttl pk-yrs)    Types: Cigarettes    Start date: 04/19/1993    Passive exposure: Current   Smokeless tobacco: Never   Tobacco comments:    she stopped smoking for a long time, but started back.   Vaping Use   Vaping status: Never Used  Substance Use Topics   Alcohol use: No   Drug use: No  [2]  Current Facility-Administered Medications:    lactated ringers  infusion, , Intravenous, Continuous, Stinson, Jacob J, DO, Last Rate: 100 mL/hr at 07/20/24 0446, New Bag at 07/20/24 0446   ondansetron  (ZOFRAN ) tablet 4 mg, 4 mg, Oral, Q6H PRN **OR** ondansetron  (ZOFRAN ) injection 4 mg, 4 mg, Intravenous, Q6H PRN, Stinson, Jacob J, DO, 4 mg at 07/19/24 2051   pantoprazole  (PROTONIX ) injection 40 mg, 40 mg, Intravenous, Q12H, Stinson, Jacob J, DO, 40 mg at 07/19/24 2051

## 2024-07-20 NOTE — Discharge Summary (Signed)
 Physician Discharge Summary   Patient: Cynthia Chavez MRN: [id number] DOB: 01-07-1956  Admit date:     07/19/2024  Discharge date: 07/20/2024  Discharge Physician: Alm Nema Oatley   PCP: Jolinda Norene HERO, DO   Recommendations at discharge:   Please follow up with primary care provider within 1-2 weeks  Please repeat BMP and CBC in one week    Hospital Course: 68 year old female with a history of GERD, hiatus hernia, esophageal dilatation right breast cancer status post right mastectomy 12/2016, DVT/PE 07/2021, hypothyroidism, presenting with dysphagia and vomiting.  The patient's symptoms began at 9 AM on 07/19/2024.   The patient describes the symptoms as throat irritation and feeling like she has something stuck in her esophagus - however patient denies any choking episodes.  Patient states that she has tried to make herself vomit several times since the incident without relief.  She denies any fevers, chills, chest pain, shortness breath, abdominal pain, diarrhea. Unfortunately, she remains intolerant to any liquids or foods. The patient received Zofran  and glucagon  which did not relieve her symptoms.  In the ED, the patient was afebrile hemodynamically stable with oxygen  saturation 93-96% room air.  WBC 8.7, hemoglobin 13.1, platelet 201.  Sodium 140, potassium 3.8, bicarbonate 21, serum creatinine 0.57. GI was consulted to assist.  Assessment and Plan: Dysphagia/vomiting - Concerned about food impaction initially - GI consult appreciated - 07/20/2024 EGD--Benign-appearing esophageal stenosis. Dilated. 6 cm hiatal hernia with a few Cameron ulcers  - Continue pantoprazole >>d/c home with esomeprazole  as pt prefers  - Continue IV fluids - diet advanced after EGD which pt tolerated   Hypothyroidism - Continue Synthroid    History of PE - No longer on anticoagulation   Right breast cancer - Status post right mastectomy with adjuvant radiation - Follow-up Dr. Odean   GERD -  Continue pantoprazole       Consultants: GI Procedures performed: EGD  Disposition: Home Diet recommendation:  soft DISCHARGE MEDICATION: Allergies as of 07/20/2024       Reactions   Bovine (beef) Protein-containing Drug Products Hives, Shortness Of Breath, Swelling, Rash   Ivp Dye [iodinated Contrast Media] Other (See Comments)   Chest pain   Other Shortness Of Breath, Swelling, Other (See Comments)   Versed  or Fentanyl  when used for endoscopy caused throat and tongue swelling   Porcine (pork) Protein-containing Drug Products Hives, Shortness Of Breath, Swelling   Augmentin  [amoxicillin -pot Clavulanate]    Unknown reaction    Dilaudid  [hydromorphone  Hcl]    PT had CP and felt shaky. Didn't like the feeling, but sx lessened w/in 10 min of IV administration   Codeine Palpitations   spasms        Medication List     TAKE these medications    esomeprazole  40 MG capsule Commonly known as: NEXIUM  Take 1 capsule (40 mg total) by mouth daily. What changed:  medication strength how much to take when to take this reasons to take this   levothyroxine  150 MCG tablet Commonly known as: SYNTHROID  TAKE 1 TABLET BY MOUTH ONCE DAILY BEFORE BREAKFAST, EXCEPT FOR SUNDAY. OMIT SUNDAY DOSE        Discharge Exam: Filed Weights   07/19/24 1557 07/19/24 2010  Weight: 81.6 kg 83.7 kg  HEENT:  [place]/AT, No thrush, no icterus CV:  RRR, no rub, no S3, no S4 Lung:  CTA, no wheeze, no rhonchi Abd:  soft/+BS, NT Ext:  No edema, no lymphangitis, no synovitis, no rash  . Condition at discharge: stable  The  results of significant diagnostics from this hospitalization (including imaging, microbiology, ancillary and laboratory) are listed below for reference.   Imaging Studies: No results found.  Microbiology: Results for orders placed or performed during the hospital encounter of 07/25/21  Resp Panel by RT-PCR (Flu A&B, Covid) Nasopharyngeal Swab     Status: None   Collection Time:  07/25/21  7:33 AM   Specimen: Nasopharyngeal Swab; Nasopharyngeal(NP) swabs in vial transport medium  Result Value Ref Range Status   SARS Coronavirus 2 by RT PCR NEGATIVE NEGATIVE Final    Comment: (NOTE) SARS-CoV-2 target nucleic acids are NOT DETECTED.  The SARS-CoV-2 RNA is generally detectable in upper respiratory specimens during the acute phase of infection. The lowest concentration of SARS-CoV-2 viral copies this assay can detect is 138 copies/mL. A negative result does not preclude SARS-Cov-2 infection and should not be used as the sole basis for treatment or other patient management decisions. A negative result may occur with  improper specimen collection/handling, submission of specimen other than nasopharyngeal swab, presence of viral mutation(s) within the areas targeted by this assay, and inadequate number of viral copies(<138 copies/mL). A negative result must be combined with clinical observations, patient history, and epidemiological information. The expected result is Negative.  Fact Sheet for Patients:  bloggercourse.com  Fact Sheet for Healthcare Providers:  seriousbroker.it  This test is no t yet approved or cleared by the [place]  FDA and  has been authorized for detection and/or diagnosis of SARS-CoV-2 by FDA under an Emergency Use Authorization (EUA). This EUA will remain  in effect (meaning this test can be used) for the duration of the COVID-19 declaration under Section 564(b)(1) of the Act, 21 U.S.C.section 360bbb-3(b)(1), unless the authorization is terminated  or revoked sooner.       Influenza A by PCR NEGATIVE NEGATIVE Final   Influenza B by PCR NEGATIVE NEGATIVE Final    Comment: (NOTE) The Xpert Xpress SARS-CoV-2/FLU/RSV plus assay is intended as an aid in the diagnosis of influenza from Nasopharyngeal swab specimens and should not be used as a sole basis for treatment. Nasal washings  and aspirates are unacceptable for Xpert Xpress SARS-CoV-2/FLU/RSV testing.  Fact Sheet for Patients: bloggercourse.com  Fact Sheet for Healthcare Providers: seriousbroker.it  This test is not yet approved or cleared by the [place]  FDA and has been authorized for detection and/or diagnosis of SARS-CoV-2 by FDA under an Emergency Use Authorization (EUA). This EUA will remain in effect (meaning this test can be used) for the duration of the COVID-19 declaration under Section 564(b)(1) of the Act, 21 U.S.C. section 360bbb-3(b)(1), unless the authorization is terminated or revoked.  Performed at [facility], [address], [place], [place] [address]     Labs: CBC: Recent Labs  Lab 07/19/24 1930  WBC 8.7  NEUTROABS 6.7  HGB 13.1  HCT 40.1  MCV 95.0  PLT 201   Basic Metabolic Panel: Recent Labs  Lab 07/19/24 1930  NA 140  K 3.8  CL 106  CO2 21*  GLUCOSE 117*  BUN 14  CREATININE 0.57  CALCIUM 9.0   Liver Function Tests: No results for input(s): AST, ALT, ALKPHOS, BILITOT, PROT, ALBUMIN  in the last 168 hours. CBG: No results for input(s): GLUCAP in the last 168 hours.  Discharge time spent: greater than 30 minutes.  Signed: Alm Schneider, MD [facility] 07/20/2024

## 2024-07-20 NOTE — Hospital Course (Addendum)
 68 year old female with a history of GERD, hiatus hernia, esophageal dilatation right breast cancer status post right mastectomy 12/2016, DVT/PE 07/2021, hypothyroidism, presenting with dysphagia and vomiting.  The patient's symptoms began at 9 AM on 07/19/2024.   The patient describes the symptoms as throat irritation and feeling like she has something stuck in her esophagus - however patient denies any choking episodes.  Patient states that she has tried to make herself vomit several times since the incident without relief.  She denies any fevers, chills, chest pain, shortness breath, abdominal pain, diarrhea. Unfortunately, she remains intolerant to any liquids or foods. The patient received Zofran  and glucagon  which did not relieve her symptoms.  In the ED, the patient was afebrile hemodynamically stable with oxygen  saturation 93-96% room air.  WBC 8.7, hemoglobin 13.1, platelet 201.  Sodium 140, potassium 3.8, bicarbonate 21, serum creatinine 0.57. GI was consulted to assist.

## 2024-07-20 NOTE — Plan of Care (Signed)
°  Problem: Education: Goal: Knowledge of General Education information will improve Description: Including pain rating scale, medication(s)/side effects and non-pharmacologic comfort measures Outcome: Progressing   Problem: Health Behavior/Discharge Planning: Goal: Ability to manage health-related needs will improve Outcome: Progressing   Problem: Clinical Measurements: Goal: Ability to maintain clinical measurements within normal limits will improve Outcome: Progressing Goal: Will remain free from infection Outcome: Progressing Goal: Diagnostic test results will improve Outcome: Progressing Goal: Respiratory complications will improve Outcome: Progressing Goal: Cardiovascular complication will be avoided Outcome: Progressing   Problem: Activity: Goal: Risk for activity intolerance will decrease Outcome: Progressing   Problem: Nutrition: Goal: Adequate nutrition will be maintained Outcome: Progressing   Problem: Coping: Goal: Level of anxiety will decrease Outcome: Progressing   Problem: Elimination: Goal: Will not experience complications related to bowel motility Outcome: Progressing Goal: Will not experience complications related to urinary retention Outcome: Progressing   Problem: Pain Managment: Goal: General experience of comfort will improve and/or be controlled Outcome: Progressing   Problem: Safety: Goal: Ability to remain free from injury will improve Outcome: Progressing   Problem: Skin Integrity: Goal: Risk for impaired skin integrity will decrease Outcome: Progressing   Problem: Education: Goal: Knowledge of General Education information will improve Description: Including pain rating scale, medication(s)/side effects and non-pharmacologic comfort measures Outcome: Progressing   Problem: Health Behavior/Discharge Planning: Goal: Ability to manage health-related needs will improve Outcome: Progressing   Problem: Clinical Measurements: Goal:  Ability to maintain clinical measurements within normal limits will improve Outcome: Progressing Goal: Will remain free from infection Outcome: Progressing Goal: Diagnostic test results will improve Outcome: Progressing Goal: Respiratory complications will improve Outcome: Progressing Goal: Cardiovascular complication will be avoided Outcome: Progressing   Problem: Activity: Goal: Risk for activity intolerance will decrease Outcome: Progressing   Problem: Nutrition: Goal: Adequate nutrition will be maintained Outcome: Progressing   Problem: Coping: Goal: Level of anxiety will decrease Outcome: Progressing   Problem: Elimination: Goal: Will not experience complications related to bowel motility Outcome: Progressing Goal: Will not experience complications related to urinary retention Outcome: Progressing   Problem: Pain Managment: Goal: General experience of comfort will improve and/or be controlled Outcome: Progressing   Problem: Safety: Goal: Ability to remain free from injury will improve Outcome: Progressing   Problem: Skin Integrity: Goal: Risk for impaired skin integrity will decrease Outcome: Progressing   Problem: Nutrition Goal: Patient maintains adequate hydration Outcome: Progressing Goal: Patient maintains weight Outcome: Progressing Goal: Patient/Family demonstrates understanding of diet Outcome: Progressing Goal: Patient/Family independently completes tube feeding Outcome: Progressing Goal: Patient will have no more than 5 lb weight change during LOS Outcome: Progressing Goal: Patient will utilize adaptive techniques to administer nutrition Outcome: Progressing Goal: Patient will verbalize dietary restrictions Outcome: Progressing

## 2024-07-20 NOTE — Anesthesia Preprocedure Evaluation (Signed)
 Anesthesia Evaluation  Patient identified by MRN, date of birth, ID band Patient awake    Reviewed: Allergy & Precautions, H&P , NPO status , Patient's Chart, lab work & pertinent test results, reviewed documented beta blocker date and time   Airway Mallampati: II  TM Distance: >3 FB Neck ROM: full    Dental no notable dental hx.    Pulmonary neg pulmonary ROS, Current Smoker and Patient abstained from smoking.   Pulmonary exam normal breath sounds clear to auscultation       Cardiovascular Exercise Tolerance: Good hypertension, negative cardio ROS  Rhythm:regular Rate:Normal     Neuro/Psych  PSYCHIATRIC DISORDERS Anxiety Depression    negative neurological ROS     GI/Hepatic Neg liver ROS, hiatal hernia,GERD  ,,  Endo/Other  Hypothyroidism    Renal/GU negative Renal ROS  negative genitourinary   Musculoskeletal   Abdominal   Peds  Hematology negative hematology ROS (+)   Anesthesia Other Findings   Reproductive/Obstetrics negative OB ROS                              Anesthesia Physical Anesthesia Plan  ASA: 3 and emergent  Anesthesia Plan: MAC   Post-op Pain Management:    Induction:   PONV Risk Score and Plan: Propofol  infusion  Airway Management Planned:   Additional Equipment:   Intra-op Plan:   Post-operative Plan:   Informed Consent: I have reviewed the patients History and Physical, chart, labs and discussed the procedure including the risks, benefits and alternatives for the proposed anesthesia with the patient or authorized representative who has indicated his/her understanding and acceptance.     Dental Advisory Given  Plan Discussed with: CRNA  Anesthesia Plan Comments:         Anesthesia Quick Evaluation

## 2024-07-20 NOTE — Anesthesia Postprocedure Evaluation (Signed)
 Anesthesia Post Note  Patient: Cynthia Chavez  Procedure(s) Performed: EGD (ESOPHAGOGASTRODUODENOSCOPY)  Patient location during evaluation: PACU Anesthesia Type: MAC Level of consciousness: awake and alert Pain management: pain level controlled Vital Signs Assessment: post-procedure vital signs reviewed and stable Respiratory status: spontaneous breathing, nonlabored ventilation, respiratory function stable and patient connected to nasal cannula oxygen  Cardiovascular status: blood pressure returned to baseline and stable Postop Assessment: no apparent nausea or vomiting Anesthetic complications: no   No notable events documented.   Last Vitals:  Vitals:   07/20/24 0806 07/20/24 0830  BP: (!) 173/87 120/77  Pulse: 68 71  Resp: 16 19  Temp: 36.5 C 36.5 C  SpO2: 95% 94%    Last Pain:  Vitals:   07/20/24 0830  TempSrc:   PainSc: Asleep                 [name]

## 2024-07-20 NOTE — Plan of Care (Signed)

## 2024-07-20 NOTE — Transfer of Care (Signed)
 Immediate Anesthesia Transfer of Care Note  Patient: JAMIESON LISA  Procedure(s) Performed: EGD (ESOPHAGOGASTRODUODENOSCOPY)  Patient Location: PACU  Anesthesia Type:General  Level of Consciousness: awake and alert   Airway & Oxygen  Therapy: Patient connected to nasal cannula oxygen   Post-op Assessment: Report given to RN and Post -op Vital signs reviewed and stable  Post vital signs: Reviewed and stable  Last Vitals:  Vitals Value Taken Time  BP 120/77 07/20/24 08:30  Temp 36.5 C 07/20/24 08:30  Pulse 65 07/20/24 08:33  Resp 18 07/20/24 08:33  SpO2 93 % 07/20/24 08:33  Vitals shown include unfiled device data.  Last Pain:  Vitals:   07/20/24 0830  TempSrc:   PainSc: Asleep         Complications: No notable events documented.

## 2024-07-21 ENCOUNTER — Encounter (HOSPITAL_COMMUNITY): Payer: Self-pay | Admitting: Gastroenterology

## 2024-07-21 NOTE — Telephone Encounter (Signed)
 Thanks for trying

## 2024-07-22 ENCOUNTER — Telehealth: Payer: Self-pay

## 2024-07-22 NOTE — Transitions of Care (Post Inpatient/ED Visit) (Unsigned)
° °  07/22/2024  Name: SHANNON BALTHAZAR MRN: [id number] DOB: 1956/05/02  Today's TOC FU Call Status: Today's TOC FU Call Status:: Unsuccessful Call (1st Attempt) Unsuccessful Call (1st Attempt) Date: 07/22/24  Attempted to reach the patient regarding the most recent Inpatient/ED visit.  Follow Up Plan: Additional outreach attempts will be made to reach the patient to complete the Transitions of Care (Post Inpatient/ED visit) call.   Signature  Charmaine Bloodgood, LPN [facility] Health Advisor [place] [facility] You Are. We Are. [facility] Direct Dial [phone]

## 2024-07-23 NOTE — Transitions of Care (Post Inpatient/ED Visit) (Unsigned)
° °  07/23/2024  Name: Cynthia Chavez MRN: [id number] DOB: 1956-04-01  Today's TOC FU Call Status: Today's TOC FU Call Status:: Unsuccessful Call (2nd Attempt) Unsuccessful Call (1st Attempt) Date: 07/22/24 Unsuccessful Call (2nd Attempt) Date: 07/23/24  Attempted to reach the patient regarding the most recent Inpatient/ED visit.  Follow Up Plan: Additional outreach attempts will be made to reach the patient to complete the Transitions of Care (Post Inpatient/ED visit) call.   Signature Julian Lemmings, LPN [facility] Nurse Health Advisor Direct Dial [phone]

## 2024-07-24 NOTE — Transitions of Care (Post Inpatient/ED Visit) (Signed)
° °  07/24/2024  Name: Cynthia Chavez MRN: [id number] DOB: [birth year]  Today's TOC FU Call Status: Today's TOC FU Call Status:: Unsuccessful Call (3rd Attempt) Unsuccessful Call (1st Attempt) Date: 07/22/24 Unsuccessful Call (2nd Attempt) Date: 07/23/24 Unsuccessful Call (3rd Attempt) Date: 07/24/24  Attempted to reach the patient regarding the most recent Inpatient/ED visit.  Follow Up Plan: No further outreach attempts will be made at this time. We have been unable to contact the patient.  Signature Julian Lemmings, LPN [facility] Nurse Health Advisor Direct Dial [phone]

## 2024-08-05 ENCOUNTER — Encounter (INDEPENDENT_AMBULATORY_CARE_PROVIDER_SITE_OTHER): Payer: Self-pay | Admitting: Gastroenterology

## 2024-08-05 NOTE — Telephone Encounter (Signed)
 Mailed letter to patient for her to call the office to schedule a HOS FU. I have been unable to reach patient by phone.

## 2024-09-04 ENCOUNTER — Other Ambulatory Visit: Payer: Self-pay | Admitting: Adult Health

## 2024-09-04 DIAGNOSIS — Z1231 Encounter for screening mammogram for malignant neoplasm of breast: Secondary | ICD-10-CM

## 2024-09-06 ENCOUNTER — Encounter: Payer: Self-pay | Admitting: Hematology and Oncology

## 2024-10-06 ENCOUNTER — Ambulatory Visit
